# Patient Record
Sex: Male | Born: 1935 | Race: White | Hispanic: No | Marital: Married | Smoking: Former smoker
Health system: Southern US, Community
[De-identification: ages and names within clinical notes are randomized; demographics above are authoritative.]

## PROBLEM LIST (undated history)

## (undated) DIAGNOSIS — I499 Cardiac arrhythmia, unspecified: Secondary | ICD-10-CM

## (undated) DIAGNOSIS — R0689 Other abnormalities of breathing: Secondary | ICD-10-CM

## (undated) DIAGNOSIS — E785 Hyperlipidemia, unspecified: Secondary | ICD-10-CM

## (undated) DIAGNOSIS — R040 Epistaxis: Secondary | ICD-10-CM

## (undated) DIAGNOSIS — M1990 Unspecified osteoarthritis, unspecified site: Secondary | ICD-10-CM

## (undated) DIAGNOSIS — D649 Anemia, unspecified: Secondary | ICD-10-CM

## (undated) DIAGNOSIS — I4892 Unspecified atrial flutter: Secondary | ICD-10-CM

## (undated) DIAGNOSIS — I779 Disorder of arteries and arterioles, unspecified: Secondary | ICD-10-CM

## (undated) DIAGNOSIS — IMO0002 Reserved for concepts with insufficient information to code with codable children: Secondary | ICD-10-CM

## (undated) DIAGNOSIS — M2550 Pain in unspecified joint: Secondary | ICD-10-CM

## (undated) DIAGNOSIS — I6529 Occlusion and stenosis of unspecified carotid artery: Secondary | ICD-10-CM

## (undated) DIAGNOSIS — H9190 Unspecified hearing loss, unspecified ear: Secondary | ICD-10-CM

## (undated) DIAGNOSIS — I10 Essential (primary) hypertension: Secondary | ICD-10-CM

## (undated) DIAGNOSIS — I351 Nonrheumatic aortic (valve) insufficiency: Secondary | ICD-10-CM

## (undated) DIAGNOSIS — Z8719 Personal history of other diseases of the digestive system: Secondary | ICD-10-CM

## (undated) HISTORY — PX: EYE SURGERY: SHX253

## (undated) HISTORY — DX: Anemia, unspecified: D64.9

## (undated) HISTORY — DX: Reserved for concepts with insufficient information to code with codable children: IMO0002

## (undated) HISTORY — DX: Occlusion and stenosis of unspecified carotid artery: I65.29

## (undated) HISTORY — DX: Essential (primary) hypertension: I10

## (undated) HISTORY — PX: CATARACT EXTRACTION: SUR2

## (undated) HISTORY — DX: Epistaxis: R04.0

## (undated) HISTORY — DX: Hyperlipidemia, unspecified: E78.5

## (undated) HISTORY — DX: Unspecified osteoarthritis, unspecified site: M19.90

## (undated) HISTORY — PX: CAROTID ENDARTERECTOMY: SUR193

## (undated) HISTORY — DX: Unspecified hearing loss, unspecified ear: H91.90

## (undated) HISTORY — DX: Pain in unspecified joint: M25.50

---

## 1993-10-21 DIAGNOSIS — IMO0002 Reserved for concepts with insufficient information to code with codable children: Secondary | ICD-10-CM

## 1993-10-21 HISTORY — DX: Reserved for concepts with insufficient information to code with codable children: IMO0002

## 1998-11-10 ENCOUNTER — Ambulatory Visit (HOSPITAL_COMMUNITY): Admission: RE | Admit: 1998-11-10 | Discharge: 1998-11-10 | Payer: Self-pay | Admitting: Gastroenterology

## 2000-11-03 ENCOUNTER — Encounter: Admission: RE | Admit: 2000-11-03 | Discharge: 2000-11-03 | Payer: Self-pay | Admitting: Gastroenterology

## 2000-11-03 ENCOUNTER — Encounter: Payer: Self-pay | Admitting: Gastroenterology

## 2001-11-05 ENCOUNTER — Encounter: Admission: RE | Admit: 2001-11-05 | Discharge: 2001-11-05 | Payer: Self-pay | Admitting: Urology

## 2001-11-05 ENCOUNTER — Encounter: Payer: Self-pay | Admitting: Urology

## 2001-11-26 ENCOUNTER — Encounter: Payer: Self-pay | Admitting: Urology

## 2001-11-26 ENCOUNTER — Encounter: Admission: RE | Admit: 2001-11-26 | Discharge: 2001-11-26 | Payer: Self-pay | Admitting: Urology

## 2001-11-27 ENCOUNTER — Ambulatory Visit (HOSPITAL_BASED_OUTPATIENT_CLINIC_OR_DEPARTMENT_OTHER): Admission: RE | Admit: 2001-11-27 | Discharge: 2001-11-27 | Payer: Self-pay | Admitting: Urology

## 2001-11-27 ENCOUNTER — Encounter (INDEPENDENT_AMBULATORY_CARE_PROVIDER_SITE_OTHER): Payer: Self-pay | Admitting: Specialist

## 2001-12-10 ENCOUNTER — Encounter (INDEPENDENT_AMBULATORY_CARE_PROVIDER_SITE_OTHER): Payer: Self-pay

## 2001-12-10 ENCOUNTER — Ambulatory Visit (HOSPITAL_COMMUNITY): Admission: RE | Admit: 2001-12-10 | Discharge: 2001-12-10 | Payer: Self-pay | Admitting: Gastroenterology

## 2002-03-10 ENCOUNTER — Encounter: Payer: Self-pay | Admitting: Family Medicine

## 2002-03-10 ENCOUNTER — Encounter: Admission: RE | Admit: 2002-03-10 | Discharge: 2002-03-10 | Payer: Self-pay | Admitting: Family Medicine

## 2002-12-16 ENCOUNTER — Observation Stay (HOSPITAL_COMMUNITY): Admission: EM | Admit: 2002-12-16 | Discharge: 2002-12-18 | Payer: Self-pay | Admitting: Gastroenterology

## 2005-12-24 ENCOUNTER — Emergency Department (HOSPITAL_COMMUNITY): Admission: EM | Admit: 2005-12-24 | Discharge: 2005-12-25 | Payer: Self-pay | Admitting: Emergency Medicine

## 2007-09-04 ENCOUNTER — Ambulatory Visit: Payer: Self-pay | Admitting: Vascular Surgery

## 2007-09-14 ENCOUNTER — Ambulatory Visit: Payer: Self-pay | Admitting: Vascular Surgery

## 2007-09-14 ENCOUNTER — Inpatient Hospital Stay (HOSPITAL_COMMUNITY): Admission: RE | Admit: 2007-09-14 | Discharge: 2007-09-15 | Payer: Self-pay | Admitting: Vascular Surgery

## 2007-09-14 ENCOUNTER — Encounter: Payer: Self-pay | Admitting: Vascular Surgery

## 2007-10-02 ENCOUNTER — Ambulatory Visit: Payer: Self-pay | Admitting: Vascular Surgery

## 2008-04-01 ENCOUNTER — Ambulatory Visit: Payer: Self-pay | Admitting: Vascular Surgery

## 2009-04-14 ENCOUNTER — Ambulatory Visit: Payer: Self-pay | Admitting: Vascular Surgery

## 2009-05-12 ENCOUNTER — Encounter: Admission: RE | Admit: 2009-05-12 | Discharge: 2009-05-12 | Payer: Self-pay | Admitting: Emergency Medicine

## 2010-04-12 ENCOUNTER — Ambulatory Visit: Payer: Self-pay | Admitting: Vascular Surgery

## 2011-03-05 NOTE — Procedures (Signed)
CAROTID DUPLEX EXAM   INDICATION:  Follow-up evaluation of known carotid artery disease.   HISTORY:  Diabetes:  Yes.  Cardiac:  No.  Hypertension:  Yes.  Smoking:  Quit 32 years ago.  Previous Surgery:  Right carotid endarterectomy with Dacron patch  angioplasty on 09/14/07.  CV History:  No.  Amaurosis Fugax No, Paresthesias No, Hemiparesis No.                                       RIGHT             LEFT  Brachial systolic pressure:         130               130  Brachial Doppler waveforms:         WNL               WNL  Vertebral direction of flow:        Antegrade         Antegrade  DUPLEX VELOCITIES (cm/sec)  CCA peak systolic                   117               157  ECA peak systolic                   200               168  ICA peak systolic                   107               108  ICA end diastolic                   36                36  PLAQUE MORPHOLOGY:                  None              Heterogenous  PLAQUE AMOUNT:                      None              Moderate  PLAQUE LOCATION:                    None              CCA, ICA, ECA   IMPRESSION:  1. Patent right internal carotid artery with no evidence of      restenosis.  2. 20-39% left internal carotid artery stenosis.       ___________________________________________  Larina Earthly, M.D.   AC/MEDQ  D:  04/14/2009  T:  04/14/2009  Job:  [id number]

## 2011-03-05 NOTE — Assessment & Plan Note (Signed)
OFFICE VISIT   [name], [name]  DOB:  1930-08-28                                       04/01/2008  CHART#:[id number]   The patient presents today for followup of his right carotid  endarterectomy and Dacron patch angioplasty on 09/14/2007.  He continues  to do well and has had no neurologic deficits.  He has had no other  major medical difficulties, specifically no cardiac or other pulmonary  difficulties.  He does report some peri-incisional numbness and this is  continuing to improve.   PHYSICAL EXAM:  General:  A well-developed, well-nourished white male  appearing his stated age of 36.  Vital signs:  Blood pressure is 147/76,  pulse is 74, respirations 18.  Neurological:  He is grossly intact  neurologically.  Neck:  His right neck incision is well-healed.  He has  no bruits bilaterally.  He has a normal right carotid pulse.   He underwent carotid duplex in our office today and this reveals widely  patent endarterectomy with no narrowing.  He does have mild plaque in  his left internal carotid artery with approximately 20-39% stenosis.  This is unchanged from his study 6 months ago.  I am quite pleased with  his initial progress.  We plan to see him again in our vascular lab per  protocol followup.   Larina Earthly, M.D.  Electronically Signed   TFE/MEDQ  D:  04/01/2008  T:  04/01/2008  Job:  [id number]   cc:   Tammy R. Collins [place], M.D.

## 2011-03-05 NOTE — Procedures (Signed)
CAROTID DUPLEX EXAM   INDICATION:  Followup of known carotid artery disease.  The patient is  asymptomatic.   HISTORY:  Diabetes:  Yes.  Cardiac:  No.  Hypertension:  Yes.  Smoking:  Quit.  Previous Surgery:  Right CEA with DPA on 09/14/2007.  CV History:  Amaurosis Fugax No, Paresthesias No, Hemiparesis No                                       RIGHT             LEFT  Brachial systolic pressure:         124               124  Brachial Doppler waveforms:         WNL               WNL  Vertebral direction of flow:        Antegrade         Antegrade  DUPLEX VELOCITIES (cm/sec)  CCA peak systolic                   120               103  ECA peak systolic                   184               140  ICA peak systolic                   99                104  ICA end diastolic                   20                34  PLAQUE MORPHOLOGY:                  N/A               Mixed, calcific  with shadowing  PLAQUE AMOUNT:                      N/A               Moderate  PLAQUE LOCATION:                    N/A               Bifurcation, ICA   IMPRESSION:  1. Mild diffuse plaquing noted in left CCA.  2. Right ICA without recurrent stenosis status post CEA.  3. Left 20-39% ICA stenosis.  4. Patent ECAs.  5. Bilateral antegrade flow in vertebral arteries.   ___________________________________________  Larina Earthly, M.D.   PB/MEDQ  D:  04/01/2008  T:  04/01/2008  Job:  [id number]

## 2011-03-05 NOTE — H&P (Signed)
HISTORY AND PHYSICAL EXAMINATION   September 06, 2007   Re:  [name], [name]               DOB:  1929/12/15   CHIEF COMPLAINT:  Extracranial cerebrovascular disease.   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  He subsequently underwent carotid  duplex evaluation revealing moderate-to-severe right internal carotid  artery stenosis, and I am seeing him for further discussion of this.  He  specifically denies any visual changes, any amaurosis fugax, transient  ischemic attack, or stroke.  He does have a history of non-insulin-  dependent diabetes, hypertension, and elevated cholesterol.  He does not  have any history of cardiac disease.   FAMILY HISTORY:  Negative for premature atherosclerotic disease.   SOCIAL HISTORY:  He is married with three children.  Is a retired  Runner, broadcasting/film/video.  He does not smoke, having quit in 1978 and does have  occasional social alcohol consumption.   REVIEW OF SYSTEMS:  GENERAL:  He has had no weight loss or gain.  His  weight is 188 pounds.  Height is 6 feet 1 inches.  CARDIAC:  No shortness of breath, chest pain, or palpitations.  PULMONARY:  No shortness of breath or cough.  GI:  He does have a hiatal hernia with reflux.  GU:  No urinary frequency.  NEURO:  Again, no dizziness, blackouts, headaches, or seizures.  ORTHOPEDIC:  Positive for arthritic joint pain.  He does have diminished hearing.   ALLERGIES:  None.   MEDICATIONS:  1. Cozaar 100 mg 1 daily.  2. Alendronate 70 mg once daily.  3. Gemfibrozil 600 mg 1 twice daily.  4. Simvastatin 80 mg 1/2 tablet at bedtime.  5. Glipizide 5 mg per day.  6. Fish oil 1000 mg 4 times per day.  7. Trazodone 100 mg at bedtime.   PHYSICAL EXAMINATION:  A well-developed and well-nourished white male  appearing staged age of 77.  Blood pressure is 147/70, pulse  84,  respirations 18.  O2 saturation is 97% on room air.  His carotid  arteries reveal no bruits bilaterally.  He has 2+ radial pulses, 2+  femoral pulses.  Chest is clear to auscultation bilaterally.  Heart has  a regular rate and rhythm without murmur.   He did undergo repeat carotid duplex in our office today to determine if  he was a candidate for surgery on the basis of duplex alone.  This did  reveal severe, greater than 80% stenosis in his right carotid system and  no severe stenosis in his left carotid system.  Discussed this with  length with the patient's wife.  I have recommended endarterectomy for  reduction of stroke risk.  I explained the procedure, including a 1-3%  risk of stroke with surgery.  Also a slight risk for cranial nerve  injury, bleeding, and infection.  He understands this and wishes to  proceed with endarterectomy for severe right internal carotid artery  stenosis on November 24 at [facility].   Larina Earthly, M.D.  Electronically Signed   TFE/MEDQ  D:  09/06/2007  T:  09/07/2007  Job:  [id number]   cc:   Tammy R. Collins [place], M.D.

## 2011-03-05 NOTE — Discharge Summary (Signed)
NAMEKYRAN, [name]                   ACCOUNT NO.:  [id number]   MEDICAL RECORD NO.:  [id number]          PATIENT TYPE:  INP   LOCATION:  3316                         FACILITY:  [facility]   PHYSICIAN:  Larina Earthly, M.D.    DATE OF BIRTH:  [birth year]   DATE OF ADMISSION:  09/14/2007  DATE OF DISCHARGE:  09/15/2007                               DISCHARGE SUMMARY   ADMISSION DIAGNOSIS:  Severe right internal carotid artery stenosis,  asymptomatic.   SECONDARY DIAGNOSES:  1. Severe right internal carotid artery stenosis, asymptomatic, status      post right carotid endarterectomy.  2. Postoperative hypotension, improved.  3. Mild postoperative acute blood loss anemia.  4. Diabetes mellitus type 2.  5. Hyperlipidemia.  6. Hypertension.  7. Remote history of peptic ulcer disease.  8. Remote history of tobacco use, quit in 1978.  9. Benign prostatic hypertrophy.   ALLERGIES:  NO KNOWN DRUG ALLERGIES.   PROCEDURE:  On September 14, 2007, a right carotid endarterectomy with  Dacron patch angioplasty by Dr. Gretta Began.   BRIEF HISTORY:  Mr. [name] is a 77 year old Caucasian male who was seen  by Dr. Arbie Cookey for extracranial cerebrovascular occlusive disease.  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  He subsequently underwent carotid  duplex revealing moderate to severe right internal carotid artery  stenosis and was referred to Dr. Arbie Cookey.  He denies any visual changes,  amaurosis fugax, history of TIA or stroke.  He underwent repeat carotid  duplex at [facility] to determine if he  was a candidate for surgery on the basis of duplex alone.  This did  reveal greater than 80% stenosis in his right carotid system and no  severe stenosis in his left.  Dr. Arbie Cookey recommended elective right  carotid endarterectomy to reduce his risk for future stroke.   HOSPITAL COURSE:  Mr. [name] was electively admitted to  [facility] on September 14, 2007.  He underwent the previously mentioned  procedure.  He was extubated and neurologically intact.  Went down to  short-stay recovery unit, was transferred to step-down unit 3300 where  it is anticipated he will remain until discharge.  He did require short-  term dopamine for hypotension, but had subsequent palpitations and was  discontinued.  His blood pressure was then treated with Hespan and it  appeared that he had no further problems.  He maintained sinus rhythm.  He did require some oxygen initially, but this has been weaned.  He was  started on NovoLog insulin sliding scale for his history of diabetes and  diabetic medications were resumed on postoperative day #1.  Current  vitals show temperature of 97.9, blood pressure 122/48 and heart rate in  the 60s in the sinus rhythm.  Labs show a white count of 7, hemoglobin  9.9, hematocrit 28.1, platelet count of 184,000.  Sodium 136, potassium  4.1, BUN 17, creatinine 1.34 and blood glucose 135.  By postoperative  day #1,  his Foley catheter and arterial line had been discontinued.  He  was ambulating and tolerating diabetic-appropriate diet.  Incision  showed no evidence of hematoma or infection.  Neurologically, he  remained intact.  Tongue was midline and he denies dysphagia.  It was  felt that he met criteria for discharge and we anticipate him going home  on September 15, 2007, postoperative day #1.  Currently he remains in  stable condition.   DISCHARGE MEDICATIONS:  1. Cozaar 100 mg daily.  2. Omeprazole 20 mg daily.  3. Simvastatin 80 mg 1/2 tablet q.h.s.  4. Glipizide 5 mg q.a.m.  5. Trazodone 50 mg 1 to 2 tablets p.o. q.h.s. p.r.n.  6. Fish oil capsule 1000 mg 2 tablets b.i.d.  7. Gemfibrozil 600 mg p.o. b.i.d.  8. Alendronate 70 mg weekly on Sundays.  9. Coated aspirin 325 mg daily.  10.Percocet 5/325 mg 1 to 2 tablets p.o. q.4 h. p.r.n. pain.   DISCHARGE INSTRUCTIONS:  He is to  continue diabetic-appropriate diet.  May shower and clean incision gently with soap and water.  Should avoid  driving or heavy lifting for the next two weeks.  Call if he develops  fever greater than 101, redness or drainage from his incision site or  any neurological changes.  He will see Dr. Arbie Cookey at the [facility] office in  approximately two weeks.  Our office will contact him regarding specific  appointment date and time.      Jerold Coombe, P.A.      Larina Earthly, M.D.  Electronically Signed    AWZ/MEDQ  D:  09/15/2007  T:  09/15/2007  Job:  [id number]   cc:   Tammy R. Collins Scotland, M.D.  Micheline Chapman

## 2011-03-05 NOTE — Assessment & Plan Note (Signed)
OFFICE VISIT   Goette, Azam  DOB:  May 31, 1930                                       10/02/2007  CHART#:[id number]   The patient presents today for followup of his right carotid  endarterectomy and Dacron patch angioplasty on September 14, 2007.  He  had done extremely well in the hospital and was discharged postoperative  day 1.  He did have some posterior cervical discomfort, probably from  positioning following the procedure, but this has improved.  He has had  no neurologic deficits and his wound is healing quite nicely.  He has  the usual amount of peri-incisional numbness.  He has no bruits  bilaterally.  His neck incision is healing without any evidence of  infection.  He is grossly intact neurologically.  He will resume full  activities without limitations.  Plan to see him again in 6 months with  repeat carotid duplex at that time.  He has tried daily aspirin and  continues to have severe nosebleeds with this, and I have instructed him  to discontinue this aspirin treatment.   Larina Earthly, M.D.  Electronically Signed   TFE/MEDQ  D:  10/02/2007  T:  10/05/2007  Job:  [id number]   cc:   Tammy R. Collins Scotland, M.D.

## 2011-03-05 NOTE — Procedures (Signed)
CAROTID DUPLEX EXAM   INDICATION:  Followup known carotid disease.   HISTORY:  Diabetes:  Yes.  Cardiac:  No.  Hypertension:  Yes.  Smoking:  Previous.  Previous Surgery:  Right carotid endarterectomy with Dacron patch  angioplasty on 09/14/2007.  CV History:  Asymptomatic.  Amaurosis Fugax No, Paresthesias No, Hemiparesis No                                       RIGHT             LEFT  Brachial systolic pressure:         110               110  Brachial Doppler waveforms:         Normal            Normal  Vertebral direction of flow:        Antegrade         Antegrade  DUPLEX VELOCITIES (cm/sec)  CCA peak systolic                   88                110  ECA peak systolic                   91                123  ICA peak systolic                   75                96  ICA end diastolic                   24                38  PLAQUE MORPHOLOGY:                  None              Heterogeneous  PLAQUE AMOUNT:                      None              Moderate  PLAQUE LOCATION:                    None              CCA, ICA, ECA   IMPRESSION:  1. Patent right internal carotid artery post endarterectomy with no      evidence of restenosis.  2. Doppler velocity suggests 20%-39% stenosis in the left internal      carotid artery.  3. Antegrade flow in bilateral vertebral arteries.   ___________________________________________  Larina Earthly, M.D.   NT/MEDQ  D:  04/12/2010  T:  04/12/2010  Job:  [id number]

## 2011-03-05 NOTE — Op Note (Addendum)
NAMEBREAKER, SPRINGER                   ACCOUNT NO.:  [id number]   MEDICAL RECORD NO.:  [id number]          PATIENT TYPE:  INP   LOCATION:  3316                         FACILITY:  [facility]   PHYSICIAN:  Larina Earthly, M.D.    DATE OF BIRTH:  11-16-1929   DATE OF PROCEDURE:  09/14/2007  DATE OF DISCHARGE:                               OPERATIVE REPORT   PREOPERATIVE DIAGNOSIS:  Severe right internal carotid artery stenosis  with probable embolus to right eye.   POSTOPERATIVE DIAGNOSIS:  Severe right internal carotid artery stenosis  with probable embolus to right eye.   PROCEDURE:  Right carotid endarterectomy and Dacron patch angioplasty.   SURGEON:  Larina Earthly, M.D.   ASSISTANT:  Jerold Coombe, P.A.-C.   ANESTHESIA:  General endotracheal.   COMPLICATIONS:  None.   DISPOSITION:  To recovery room neurologically intact.   PROCEDURE IN DETAIL:  The patient was taken to the operating room,  placed supine position.  The right neck was prepped and draped in usual  sterile fashion.  An incision was made over the anterior  sternocleidomastoid and carried down to the platysma with  electrocautery.  The sternocleidomastoid reflected posteriorly and the  carotid sheath was opened.  The fascial vein was ligated with 2-0 silk  ties and divided.  The common carotid artery was encircled with an  umbilical tape ____ QA MARKER: 91 ____ tourn  hypoglossal nerves were identified and preserved.  Dissection was ____                                                              QA MARKER: 95____ bifurcation ____ QA MARKER____   was encircled with a 2-0 silk Potts tie.  The external carotid was  encircled with a blue vessel loop.  The internal carotid was encircled  with umbilical tape ____ QA MARKER: 102 ____ tourn  was given 8000 units of intravenous heparin.  After adequate circulation  time the internal, external and common carotid arteries were occluded.  The common carotid artery was  opened with an 11 blade ____ QA MARKER:                                                   112____ Potts scissors ____ QA MARKER____   carotid artery.  The patient had an extremely ulcerative plaque.  The  endarterectomy was begun on the common carotid artery and the plaques  divided proximally with the Potts scissors.  The endarterectomy was  carried on to the bifurcation.  The external carotids ____ QA MARKER:  126____ technique and the internal carotids ____ QA MARKER____   in open fashion.  Remaining thrombus debris was removed from the  endarterectomy plane.  The ____ QA MARKER: 134 ____ Gillie Manners  brought onto the field and ____ QA MARKER: 136 ____ patch  with a running 6-0 Prolene suture.  Prior to completion of the  anastomosis the shunt was removed in the usual flush maneuvers were  undertaken.  The anastomosis was completed.  Flow was restored first to  the external and then internal carotid artery.  Excellent flow  characteristics were noted with handheld Doppler in the internal and  external carotid arteries.  The patient was given 50 mg of ____ QA                                                           MARKER: 155____ to reverse heparin.  The wounds were irrigated with      saline and made hemostatic with cautery.  The wounds were closed with 3-  0 Vicryl to reapproximate the sternocleidomastoid at the carotid sheath.  Next the platysma was closed with a running 3-0 Vicryl suture and  finally  the skin was closed with 4-0 subcuticular Vicryl stitch.  A sterile  dressing, benzoin and Steri-Strips were applied and the patient was  awakened in the operating room neurologically intact, transferred to the  recovery room in stable condition.      Larina Earthly, M.D.  Electronically Signed     TFE/MEDQ  D:  09/14/2007  T:  09/14/2007  Job:  [id number]   cc:   Tammy R. Collins Scotland, M.D.

## 2011-03-05 NOTE — Procedures (Signed)
CAROTID DUPLEX EXAM   INDICATION:  Followup bilateral carotid artery disease.   HISTORY:  Diabetes:  Yes.  Cardiac:  No.  Hypertension:  Yes.  Smoking:  Quit 30 years ago.  Previous Surgery:  CV History:  Amaurosis Fugax No, Paresthesias No, Hemiparesis No                                       RIGHT             LEFT  Brachial systolic pressure:         130               176  Brachial Doppler waveforms:         Biphasic.         Biphasic.  Vertebral direction of flow:        Antegrade.        Antegrade.  DUPLEX VELOCITIES (cm/sec)  CCA peak systolic                   76                110  ECA peak systolic                   118               170  ICA peak systolic                   337               104  ICA end diastolic                   116               23  PLAQUE MORPHOLOGY:                  Heterogeneous.    Heterogeneous.  PLAQUE AMOUNT:                      Severe.           Mild.  PLAQUE LOCATION:                    ICA and ECA.      ICA and ECA.   IMPRESSION:  1. 80-99% stenosis noted in the right ICA.  2. 20-30% stenosis noted in the left ICA.  3. Antegrade bilateral vertebral arteries.   ___________________________________________  Larina Earthly, M.D.   MG/MEDQ  D:  09/04/2007  T:  09/05/2007  Job:  [id number]

## 2011-03-08 NOTE — Op Note (Signed)
[facility]  Patient:    [name], PINN Visit Number: [id number] MRN: [id number]          Service Type: NES Location: NESC Attending Physician:  Tania Ade Dictated by:   Lucrezia Starch. Ovidio Hanger, M.D. Proc. Date: 11/27/01 Admit Date:  11/27/2001                             Operative Report  DIAGNOSIS:  Bladder pain.  OPERATIVE PROCEDURE: 1. Cystourethroscopy. 2. Bladder biopsy.  SURGEON:  Lucrezia Starch. Ovidio Hanger, M.D.  ANESTHESIA:  General laryngeal airway.  BLOOD LOSS:  Negligible.  TUBES:  None.  COMPLICATIONS:  None.  INDICATION FOR PROCEDURE:  Mr. Lattanzio is a very nice 77 year old white male, who presented with a two month history of abdominal pain and discomfort, pain in the penis, nocturia x 2, variable flow, hesitancy, urgency, and frequency. He has undergo a CT scan of the abdomen and pelvis which revealed no significant abnormalities and office cystoscopy which was essentially normal. With the continued pain, it was felt that cystourethroscopy and bladder biopsy was indicated.  He understands the risks, benefits, and alternatives and elected to proceed.  PROCEDURE IN DETAIL:  The patient was placed in the supine position.  After proper general laryngeal airway anesthesia, was placed in the dorsal lithotomy position and prepped and draped with Betadine in a sterile fashion. Cystourethroscopy was performed with a 22.5 French Olympus panendoscope, utilizing the 12 and 70 degree lenses.  The bladder was carefully inspected. He was noted to have moderate trilobar hypertrophy; grade 1 trabeculation was noted.  Efflux of clear urine was noted with normally-placed ureteral orifices bilaterally.  No other lesions were noted.  A biopsy was obtained from the posterior midline and submitted to pathology, and base was cauterized with Bugbee coagulation cautery.  The bladder was drained; the panendoscope was removed.  There had been no palpable  bladder masses on rectal examination, and the prostate was benign.  The patient was taken to the recovery room stable. Dictated by:   Lucrezia Starch. Ovidio Hanger, M.D. Attending Physician:  Tania Ade DD:  11/27/01 TD:  11/27/01 Job: [id number]

## 2011-03-08 NOTE — Procedures (Signed)
[place]. [facility]  Patient:    [name], [name] Visit Number: [id number] MRN: [id number]          Service Type: NES Location: [facility] Attending Physician:  Tania Ade Dictated by:   Llana Aliment. Randa Evens, M.D. Proc. Date: 12/10/01 Admit Date:  11/27/2001 Discharge Date: 11/27/2001   CC:         Tammy R. Collins [place], M.D.   Procedure Report  DATE OF BIRTH:  1930-04-09.  PROCEDURE PERFORMED:  Colonoscopy and polypectomy.  ENDOSCOPIST:  Llana Aliment. Randa Evens, M.D.  MEDICATIONS USED:  Fentanyl 100 mcg, Versed 12.5 mg IV.  INSTRUMENT:  Adult Olympus video colonoscope.  INDICATIONS:  77 year old who had an ascending colon polyp removed three years go.  This is done as a three-year follow.  DESCRIPTION OF PROCEDURE:  The procedure had been explained to the patient and consent obtained.  With the patient in the left lateral decubitus position, the adult colonoscope was inserted and advanced under direct visualization. The prep was quite good.  The patient had marked diverticular disease.  Some time was taken to pass the sigmoid colon and we were able to advance through this area to the cecum.  The ileocecal valve and appendiceal orifice were seen.  The scope was withdrawn.  In the proximal ascending colon, a 0.5 cm pedunculated polyp was removed with a snare and sucked through the scope. Just distal to that also in the ascending colon, another 0.5 cm polyp was removed and sucked through the scope. These were both placed in jar #1.  The transverse colon was unremarkable.  No polyps were seen.  In the proximal descending colon 100 cm from the anal verge, a very large 1.5 cm polyp on a very small stalk was encountered.  This was removed with a snare and recovered, was temporarily lost and on the way back in the distal descending colon approximately 50 cm a 1 cm polyp was encountered and was removed with a snare and sucked through the scope.  We eventually found a  larger polyp and removed this.  There was marked diverticular disease again in the sigmoid colon.  No gross polyps were seen in that area.  The rectum was free of polyps.  Scope withdrawn, patient tolerated the procedure well.  He had some transient discomfort improved after air was decompressed from the colon.  ASSESSMENT: 1. Multiple polyps removed. 2. Marked diverticular disease.  PLAN:  Routine post polypectomy instructions.  Due to the number of polyps, will recommend repeating this procedure in one year.  Will probably suggest to him that he have this done at an outpatient setting where he can receive Diprivan. Dictated by:   Llana Aliment. Randa Evens, M.D. Attending Physician:  Tania Ade DD:  12/10/01 TD:  12/10/01 Job: [id number]

## 2011-04-16 ENCOUNTER — Other Ambulatory Visit: Payer: Self-pay

## 2011-04-16 ENCOUNTER — Ambulatory Visit: Payer: Self-pay | Admitting: Vascular Surgery

## 2011-04-23 ENCOUNTER — Ambulatory Visit: Payer: Self-pay | Admitting: Vascular Surgery

## 2011-04-23 ENCOUNTER — Other Ambulatory Visit: Payer: Self-pay

## 2011-05-07 ENCOUNTER — Ambulatory Visit: Payer: Self-pay | Admitting: Vascular Surgery

## 2011-05-07 ENCOUNTER — Other Ambulatory Visit: Payer: Self-pay

## 2011-06-04 ENCOUNTER — Encounter: Payer: Self-pay | Admitting: Vascular Surgery

## 2011-06-21 ENCOUNTER — Encounter: Payer: Self-pay | Admitting: Vascular Surgery

## 2011-06-25 ENCOUNTER — Ambulatory Visit (INDEPENDENT_AMBULATORY_CARE_PROVIDER_SITE_OTHER): Payer: Medicare Other | Admitting: Vascular Surgery

## 2011-06-25 ENCOUNTER — Encounter: Payer: Self-pay | Admitting: Vascular Surgery

## 2011-06-25 ENCOUNTER — Other Ambulatory Visit (INDEPENDENT_AMBULATORY_CARE_PROVIDER_SITE_OTHER): Payer: Medicare Other

## 2011-06-25 VITALS — BP 151/77 | HR 53 | Resp 16 | Ht 73.0 in | Wt 195.0 lb

## 2011-06-25 DIAGNOSIS — I6529 Occlusion and stenosis of unspecified carotid artery: Secondary | ICD-10-CM

## 2011-06-25 DIAGNOSIS — Z48812 Encounter for surgical aftercare following surgery on the circulatory system: Secondary | ICD-10-CM

## 2011-06-25 NOTE — Progress Notes (Signed)
The patient presents today for followup of his extracranial cerebrovascular occlusive disease. He is status post right carotid endarterectomy in November of 2008. He remains quite active and denies any new medical difficulties. He denies any focal neurologic deficits specifically no amaurosis fugax transient ischemic attack or stroke.  Past Medical History  Diagnosis Date  . Diabetes mellitus age 77  . Hyperlipidemia   . Hypertension   . Arthritis   . Bleeding nose     occasional  . Change in hearing   . Joint pain   . Anemia   . Ulcer   . Hiatal hernia     History  Substance Use Topics  . Smoking status: Former Smoker    Types: Cigarettes    Quit date: 10/21/1976  . Smokeless tobacco: Not on file  . Alcohol Use: Yes     1-2 alcoholic drinks daily    History reviewed. No pertinent family history.  No Known Allergies  Current outpatient prescriptions:fish oil-omega-3 fatty acids 1000 MG capsule, Take 2 g by mouth 2 (two) times daily. , Disp: , Rfl: ;  gemfibrozil (LOPID) 600 MG tablet, Take 600 mg by mouth 2 (two) times daily before a meal.  , Disp: , Rfl: ;  glipiZIDE (GLUCOTROL) 5 MG tablet, Take 5 mg by mouth daily.  , Disp: , Rfl: ;  insulin detemir (LEVEMIR) 100 UNIT/ML injection, Inject 25 Units into the skin daily before breakfast.  , Disp: , Rfl:  losartan (COZAAR) 100 MG tablet, Take 100 mg by mouth daily.  , Disp: , Rfl: ;  simvastatin (ZOCOR) 80 MG tablet, Take 40 mg by mouth at bedtime.  , Disp: , Rfl: ;  traZODone (DESYREL) 100 MG tablet, Take 100 mg by mouth at bedtime.  , Disp: , Rfl: ;  alendronate (FOSAMAX) 70 MG tablet, Take 70 mg by mouth every 7 (seven) days. Take with a full glass of water on an empty stomach. , Disp: , Rfl:   BP 151/77  Pulse 53  Resp 16  Ht 6\' 1"  (1.854 m)  Wt 195 lb (88.451 kg)  BMI 25.73 kg/m2  Body mass index is 25.73 kg/(m^2).         Review of systems: Hiatal hernia urinary frequency, change in hearing and nosebleeds,  arthritis joint pain muscle pain. All of systems negative.  Physical exam: Well-developed well-nourished white male in no acute distress. HEENT normal. Chest clear bilaterally without wheezing. Heart regular rate and rhythm without murmur. Well-healed right carotid incision with no bruits bilaterally. Her logic no focal weakness paresthesias. Skin without ulcers or rashes.  Carotid duplex: Widely patent endarterectomy on the right and 1-39% stenosis in the left internal carotid artery.  Impression: Stable status post right carotid endarterectomy  Plan: A carotid duplex in one year. The patient will notify us for any neurologic deficits.

## 2011-07-04 NOTE — Procedures (Unsigned)
CAROTID DUPLEX EXAM  INDICATION:  Followup carotid disease; right CEA.  HISTORY: Diabetes:  Yes. Cardiac:  No. Hypertension:  Yes. Smoking:  Previous. Previous Surgery:  Right CEA 09/14/2007. CV History: Amaurosis Fugax No, Paresthesias No, Hemiparesis No                                      RIGHT             LEFT Brachial systolic pressure:         148               160 Brachial Doppler waveforms:         WNL               WNL Vertebral direction of flow:        Antegrade         Antegrade DUPLEX VELOCITIES (cm/sec) CCA peak systolic                   87                111 ECA peak systolic                   115               153 ICA peak systolic                   48                78 ICA end diastolic                   13                22 PLAQUE MORPHOLOGY:                                    Heterogeneous PLAQUE AMOUNT:                      NA                Mild PLAQUE LOCATION:                                      CCA/ICA  IMPRESSION: 1. Widely patent right carotid endarterectomy without evidence of     hyperplasia or restenosis. 2. 1%-39% left internal carotid artery plaquing; mild diffuse plaquing     of left common carotid artery. 3. Bilateral vertebral arteries are within normal limits. 4. Significant stenosis of right subclavian artery with velocities of     282 cm/s.  ___________________________________________ Larina Earthly, M.D.  LT/MEDQ  D:  06/25/2011  T:  06/25/2011  Job:  [id number]

## 2011-07-30 LAB — CBC
Hemoglobin: 9.9 — ABNORMAL LOW
MCHC: 35
MCHC: 35.1
MCV: 95.3
Platelets: 184
Platelets: 270
RDW: 12.4

## 2011-07-30 LAB — BASIC METABOLIC PANEL
BUN: 17
CO2: 22
Calcium: 8.4
Creatinine, Ser: 1.34
GFR calc non Af Amer: 52 — ABNORMAL LOW
Glucose, Bld: 135 — ABNORMAL HIGH
Sodium: 136

## 2011-07-30 LAB — TYPE AND SCREEN: ABO/RH(D): A POS

## 2011-07-30 LAB — URINALYSIS, ROUTINE W REFLEX MICROSCOPIC
Ketones, ur: NEGATIVE
Nitrite: NEGATIVE
Specific Gravity, Urine: 1.021
Urobilinogen, UA: 1
pH: 6.5

## 2011-07-30 LAB — COMPREHENSIVE METABOLIC PANEL
AST: 20
Albumin: 3.8
BUN: 19
Calcium: 9.6
Creatinine, Ser: 1.02
GFR calc Af Amer: >60
GFR calc non Af Amer: >60

## 2011-07-30 LAB — APTT: aPTT: 24

## 2011-07-30 LAB — PROTIME-INR: INR: 0.9

## 2011-12-13 ENCOUNTER — Other Ambulatory Visit: Payer: Self-pay | Admitting: Dermatology

## 2012-06-23 ENCOUNTER — Other Ambulatory Visit: Payer: Self-pay | Admitting: *Deleted

## 2012-06-23 DIAGNOSIS — I6529 Occlusion and stenosis of unspecified carotid artery: Secondary | ICD-10-CM

## 2012-06-23 DIAGNOSIS — Z48812 Encounter for surgical aftercare following surgery on the circulatory system: Secondary | ICD-10-CM

## 2012-06-29 ENCOUNTER — Encounter: Payer: Self-pay | Admitting: Neurosurgery

## 2012-06-30 ENCOUNTER — Other Ambulatory Visit (INDEPENDENT_AMBULATORY_CARE_PROVIDER_SITE_OTHER): Payer: Medicare Other | Admitting: *Deleted

## 2012-06-30 ENCOUNTER — Encounter: Payer: Self-pay | Admitting: Neurosurgery

## 2012-06-30 ENCOUNTER — Ambulatory Visit (INDEPENDENT_AMBULATORY_CARE_PROVIDER_SITE_OTHER): Payer: Medicare Other | Admitting: Neurosurgery

## 2012-06-30 VITALS — BP 130/60 | HR 50 | Resp 14 | Ht 73.0 in | Wt 203.6 lb

## 2012-06-30 DIAGNOSIS — I6529 Occlusion and stenosis of unspecified carotid artery: Secondary | ICD-10-CM

## 2012-06-30 DIAGNOSIS — Z48812 Encounter for surgical aftercare following surgery on the circulatory system: Secondary | ICD-10-CM

## 2012-06-30 HISTORY — PX: OTHER SURGICAL HISTORY: SHX169

## 2012-06-30 NOTE — Addendum Note (Signed)
Addended by: Sharee Pimple on: 06/30/2012 04:20 PM   Modules accepted: Orders

## 2012-06-30 NOTE — Progress Notes (Signed)
[facility] Carotid Office Note  CC: Annual carotid surveillance Referring Physician: Early  History of Present Illness: 78 year old patient of Dr. Arbie Cookey status post right CEA in November 2008. The patient denies any signs or symptoms of CVA, TIA, amaurosis fugax or any neural deficit. The patient denies any right upper extremity steal syndrome pain and has no other vascular complaints.  Past Medical History  Diagnosis Date  . Diabetes mellitus age 39  . Hyperlipidemia   . Hypertension   . Arthritis   . Bleeding nose     occasional  . Change in hearing   . Joint pain   . Anemia   . Ulcer   . Hiatal hernia   . Carotid artery occlusion     ROS: [x]  Positive   [ ]  Denies    General: [ ]  Weight loss, [ ]  Fever, [ ]  chills Neurologic: [ ]  Dizziness, [ ]  Blackouts, [ ]  Seizure [ ]  Stroke, [ ]  "Mini stroke", [ ]  Slurred speech, [ ]  Temporary blindness; [ ]  weakness in arms or legs, [ ]  Hoarseness Cardiac: [ ]  Chest pain/pressure, [ ]  Shortness of breath at rest [ ]  Shortness of breath with exertion, [ ]  Atrial fibrillation or irregular heartbeat Vascular: [ ]  Pain in legs with walking, [ ]  Pain in legs at rest, [ ]  Pain in legs at night,  [ ]  Non-healing ulcer, [ ]  Blood clot in vein/DVT,   Pulmonary: [ ]  Home oxygen, [ ]  Productive cough, [ ]  Coughing up blood, [ ]  Asthma,  [ ]  Wheezing Musculoskeletal:  [ ]  Arthritis, [ ]  Low back pain, [ ]  Joint pain Hematologic: [ ]  Easy Bruising, [ ]  Anemia; [ ]  Hepatitis Gastrointestinal: [ ]  Blood in stool, [ ]  Gastroesophageal Reflux/heartburn, [ ]  Trouble swallowing Urinary: [ ]  chronic Kidney disease, [ ]  on HD - [ ]  MWF or [ ]  TTHS, [ ]  Burning with urination, [ ]  Difficulty urinating Skin: [ ]  Rashes, [ ]  Wounds Psychological: [ ]  Anxiety, [ ]  Depression   Social History History  Substance Use Topics  . Smoking status: Former Smoker    Types: Cigarettes    Quit date: 10/21/1976  . Smokeless tobacco:  Not on file  . Alcohol Use: Yes     1-2 alcoholic drinks daily    Family History Family History  Problem Relation Age of Onset  . Cancer Mother     Pancreatic    No Known Allergies  Current Outpatient Prescriptions  Medication Sig Dispense Refill  . fish oil-omega-3 fatty acids 1000 MG capsule Take 2 g by mouth 2 (two) times daily.       Marland Kitchen gemfibrozil (LOPID) 600 MG tablet Take 600 mg by mouth 2 (two) times daily before a meal.        . glipiZIDE (GLUCOTROL) 5 MG tablet Take 5 mg by mouth daily.        . insulin detemir (LEVEMIR) 100 UNIT/ML injection Inject 40 Units into the skin daily before breakfast.       . losartan (COZAAR) 100 MG tablet Take 100 mg by mouth daily.        . simvastatin (ZOCOR) 80 MG tablet Take 40 mg by mouth at bedtime.        . traZODone (DESYREL) 100 MG tablet Take 100 mg by mouth at bedtime.        Marland Kitchen alendronate (FOSAMAX) 70 MG tablet Take 70 mg by mouth every 7 (seven) days.  Take with a full glass of water on an empty stomach.         Physical Examination  Filed Vitals:   06/30/12 1458  BP: 130/60  Pulse: 50  Resp:     Body mass index is 26.86 kg/(m^2).  General:  WDWN in NAD Gait: Normal HEENT: WNL Eyes: Pupils equal Pulmonary: normal non-labored breathing , without Rales, rhonchi,  wheezing Cardiac: RRR, without  Murmurs, rubs or gallops; Abdomen: soft, NT, no masses Skin: no rashes, ulcers noted  Vascular Exam Pulses: 2+ radial pulses bilaterally Carotid bruits: Carotid pulses to auscultation no bruits are heard Extremities without ischemic changes, no Gangrene , no cellulitis; no open wounds;  Musculoskeletal: no muscle wasting or atrophy   Neurologic: A&O X 3; Appropriate Affect ; SENSATION: normal; MOTOR FUNCTION:  moving all extremities equally. Speech is fluent/normal  Non-Invasive Vascular Imaging CAROTID DUPLEX 06/30/2012  Right ICA 0 - 19% stenosis Left ICA 20 - 39 % stenosis   ASSESSMENT/PLAN: Asymptomatic patient 5  years status post right carotid endarterectomy doing well. The patient will followup in one year with repeat carotid duplex, his questions were encouraged and answered he is in agreement with this plan.  Lauree Chandler ANP   Clinic MD: Early

## 2012-07-21 DIAGNOSIS — I499 Cardiac arrhythmia, unspecified: Secondary | ICD-10-CM

## 2012-07-21 HISTORY — DX: Cardiac arrhythmia, unspecified: I49.9

## 2012-09-22 ENCOUNTER — Other Ambulatory Visit (HOSPITAL_COMMUNITY): Payer: Self-pay | Admitting: Cardiovascular Disease

## 2012-09-22 DIAGNOSIS — I4892 Unspecified atrial flutter: Secondary | ICD-10-CM

## 2012-09-22 DIAGNOSIS — Z01818 Encounter for other preprocedural examination: Secondary | ICD-10-CM

## 2012-10-08 ENCOUNTER — Ambulatory Visit (HOSPITAL_COMMUNITY)
Admission: RE | Admit: 2012-10-08 | Discharge: 2012-10-08 | Disposition: A | Discharge status: Home / Self Care | Payer: Medicare Other | Source: Ambulatory Visit | Attending: Cardiovascular Disease | Admitting: Cardiovascular Disease

## 2012-10-08 DIAGNOSIS — E663 Overweight: Secondary | ICD-10-CM | POA: Insufficient documentation

## 2012-10-08 DIAGNOSIS — Z01818 Encounter for other preprocedural examination: Secondary | ICD-10-CM

## 2012-10-08 DIAGNOSIS — F17200 Nicotine dependence, unspecified, uncomplicated: Secondary | ICD-10-CM | POA: Insufficient documentation

## 2012-10-08 DIAGNOSIS — I4892 Unspecified atrial flutter: Secondary | ICD-10-CM

## 2012-10-08 DIAGNOSIS — E119 Type 2 diabetes mellitus without complications: Secondary | ICD-10-CM | POA: Insufficient documentation

## 2012-10-08 DIAGNOSIS — I10 Essential (primary) hypertension: Secondary | ICD-10-CM | POA: Insufficient documentation

## 2012-10-08 HISTORY — PX: NM MYOCAR MULTIPLE W/SPECT: HXRAD626

## 2012-10-08 HISTORY — PX: TRANSTHORACIC ECHOCARDIOGRAM: SHX275

## 2012-10-08 MED ORDER — TECHNETIUM TC 99M SESTAMIBI GENERIC - CARDIOLITE
11.0000 | Freq: Once | INTRAVENOUS | Status: AC | PRN
  Administered 2012-10-08: 11 via INTRAVENOUS

## 2012-10-08 MED ORDER — AMINOPHYLLINE 25 MG/ML IV SOLN
75.0000 mg | Freq: Once | INTRAVENOUS | Status: AC
  Administered 2012-10-08: 75 mg via INTRAVENOUS

## 2012-10-08 MED ORDER — REGADENOSON 0.4 MG/5ML IV SOLN
0.4000 mg | Freq: Once | INTRAVENOUS | Status: AC
  Administered 2012-10-08: 0.4 mg via INTRAVENOUS

## 2012-10-08 MED ORDER — TECHNETIUM TC 99M SESTAMIBI GENERIC - CARDIOLITE
30.2000 | Freq: Once | INTRAVENOUS | Status: AC | PRN
  Administered 2012-10-08: 30.2 via INTRAVENOUS

## 2012-10-08 NOTE — Procedures (Addendum)
[facility] [address] [place] [address] [phone]  Cardiology Nuclear Med Study  [name] is a 78 y.o. male     MRN : [id number]     DOB: Jun 15, 1930  Procedure Date: 10/08/2012  Nuclear Med Background Indication for Stress Test:  Surgical Clearance and Abnormal EKG History:  PVD Cardiac Risk Factors: Hypertension, Lipids, NIDDM, Overweight and Smoker  Symptoms:  none   Nuclear Pre-Procedure Caffeine/Decaff Intake:  10:00pm NPO After: 7:30am   IV Site: R Antecubital  IV 0.9% NS with Angio Cath:  22g  Chest Size (in):  46 IV Started by: Koren Shiver, CNMT  Height: 6\' 1"  (1.854 m)  Cup Size: n/a  BMI:  Body mass index is 27.44 kg/(m^2). Weight:  208 lb (94.348 kg)   Tech Comments:  n/a    Nuclear Med Study 1 or 2 day study: 1 day  Stress Test Type:  Lexiscan  Order Authorizing Provider:  Nanetta Batty, MD   Resting Radionuclide: Technetium 35m Sestamibi  Resting Radionuclide Dose: 11.0 mCi   Stress Radionuclide:  Technetium 47m Sestamibi  Stress Radionuclide Dose: 30.2 mCi           Stress Protocol Rest HR: 74 Stress HR: 93  Rest BP: 181/92 Stress BP:184/71  Exercise Time (min): n/a METS: n/a          Dose of Adenosine (mg):  n/a Dose of Lexiscan: 0.4 mg  Dose of Atropine (mg): n/a Dose of Dobutamine: n/a mcg/kg/min (at max HR)  Stress Test Technologist: Ernestene Mention, CCT Nuclear Technologist: Gonzella Lex, CNMT   Rest Procedure:  Myocardial perfusion imaging was performed at rest 45 minutes following the intravenous administration of Technetium 63m Sestamibi. Stress Procedure:  The patient received IV Lexiscan 0.4 mg over 15-seconds.  Technetium 54m Sestamibi injected at 30-seconds.  Patient was given 75 mg of aminophylline through his IV due to his dizziness and shortness of breath. Symptoms were resolved after a six minute recovery period.There were no significant changes with Lexiscan.   Quantitative spect images were obtained after a 45 minute delay.  Transient Ischemic Dilatation (Normal <1.22):  1.14 Lung/Heart Ratio (Normal <0.45):  0.26 QGS EDV:  94 ml QGS ESV:  38 ml LV Ejection Fraction: 60%  Signed by       Rest ECG: IRBBB  Stress ECG: No significant change from baseline ECG  QPS Raw Data Images:  Normal; no motion artifact; normal heart/lung ratio. Stress Images:  Normal homogeneous uptake in all areas of the myocardium. Rest Images:  Comparison with the stress images reveals no significant change. Subtraction (SDS):  No evidence of ischemia.  Impression Exercise Capacity:  Lexiscan with no exercise. BP Response:  Normal blood pressure response. Clinical Symptoms:  No significant symptoms noted. ECG Impression:  No significant ST segment change suggestive of ischemia. Comparison with Prior Nuclear Study: No images to compare  Overall Impression:  Normal stress nuclear study.  LV Wall Motion:  NL LV Function; NL Wall Motion   Runell Gess, MD  10/08/2012 6:05 PM

## 2012-10-08 NOTE — Progress Notes (Signed)
[facility]   2D echo completed 10/08/2012.   Cindy Mc Bloodworth, RDCS   

## 2012-11-05 ENCOUNTER — Encounter (HOSPITAL_COMMUNITY): Payer: Self-pay | Admitting: Pharmacy Technician

## 2012-11-10 ENCOUNTER — Ambulatory Visit (HOSPITAL_COMMUNITY)
Admission: RE | Admit: 2012-11-10 | Discharge: 2012-11-10 | Disposition: A | Discharge status: Home / Self Care | Payer: Medicare Other | Source: Ambulatory Visit | Attending: Surgical | Admitting: Surgical

## 2012-11-10 ENCOUNTER — Encounter (HOSPITAL_COMMUNITY)
Admission: RE | Admit: 2012-11-10 | Discharge: 2012-11-10 | Disposition: A | Discharge status: Home / Self Care | Payer: Medicare Other | Source: Ambulatory Visit | Attending: Orthopedic Surgery | Admitting: Orthopedic Surgery

## 2012-11-10 ENCOUNTER — Encounter (HOSPITAL_COMMUNITY): Payer: Self-pay

## 2012-11-10 DIAGNOSIS — Z87891 Personal history of nicotine dependence: Secondary | ICD-10-CM | POA: Insufficient documentation

## 2012-11-10 DIAGNOSIS — Z01812 Encounter for preprocedural laboratory examination: Secondary | ICD-10-CM | POA: Insufficient documentation

## 2012-11-10 DIAGNOSIS — I700 Atherosclerosis of aorta: Secondary | ICD-10-CM | POA: Insufficient documentation

## 2012-11-10 DIAGNOSIS — Z01818 Encounter for other preprocedural examination: Secondary | ICD-10-CM | POA: Insufficient documentation

## 2012-11-10 DIAGNOSIS — H9190 Unspecified hearing loss, unspecified ear: Secondary | ICD-10-CM

## 2012-11-10 DIAGNOSIS — I10 Essential (primary) hypertension: Secondary | ICD-10-CM | POA: Insufficient documentation

## 2012-11-10 DIAGNOSIS — R0689 Other abnormalities of breathing: Secondary | ICD-10-CM

## 2012-11-10 HISTORY — DX: Unspecified hearing loss, unspecified ear: H91.90

## 2012-11-10 HISTORY — DX: Cardiac arrhythmia, unspecified: I49.9

## 2012-11-10 HISTORY — DX: Other abnormalities of breathing: R06.89

## 2012-11-10 HISTORY — DX: Personal history of other diseases of the digestive system: Z87.19

## 2012-11-10 LAB — COMPREHENSIVE METABOLIC PANEL
ALT: 24 U/L (ref 0–53)
AST: 18 U/L (ref 0–37)
Albumin: 3.6 g/dL (ref 3.5–5.2)
Alkaline Phosphatase: 67 U/L (ref 39–117)
BUN: 17 mg/dL (ref 6–23)
CO2: 27 mEq/L (ref 19–32)
Calcium: 9.8 mg/dL (ref 8.4–10.5)
Chloride: 104 mEq/L (ref 96–112)
Creatinine, Ser: 0.78 mg/dL (ref 0.50–1.35)
GFR calc Af Amer: >90 mL/min (ref >90)
GFR calc non Af Amer: 83 mL/min — ABNORMAL LOW (ref >90)
Glucose, Bld: 203 mg/dL — ABNORMAL HIGH (ref 70–99)
Potassium: 3.9 mEq/L (ref 3.5–5.1)
Sodium: 140 mEq/L (ref 135–145)
Total Bilirubin: 0.4 mg/dL (ref 0.3–1.2)
Total Protein: 6.8 g/dL (ref 6.0–8.3)

## 2012-11-10 LAB — CBC
HCT: 41.4 % (ref 39.0–52.0)
Hemoglobin: 13.9 g/dL (ref 13.0–17.0)
MCV: 92.8 fL (ref 78.0–100.0)
Platelets: 231 10*3/uL (ref 150–400)
RBC: 4.46 MIL/uL (ref 4.22–5.81)
WBC: 8.8 10*3/uL (ref 4.0–10.5)

## 2012-11-10 LAB — URINALYSIS, ROUTINE W REFLEX MICROSCOPIC
Bilirubin Urine: NEGATIVE
Glucose, UA: >1000 mg/dL — AB
Hgb urine dipstick: NEGATIVE
Ketones, ur: NEGATIVE mg/dL
Leukocytes, UA: NEGATIVE
Nitrite: NEGATIVE
Protein, ur: 30 mg/dL — AB
Specific Gravity, Urine: 1.027 (ref 1.005–1.030)
Urobilinogen, UA: 0.2 mg/dL (ref 0.0–1.0)
pH: 5 (ref 5.0–8.0)

## 2012-11-10 LAB — ABO/RH: ABO/RH(D): A POS

## 2012-11-10 LAB — APTT: aPTT: 27 seconds (ref 24–37)

## 2012-11-10 LAB — URINE MICROSCOPIC-ADD ON

## 2012-11-10 LAB — PROTIME-INR
INR: 0.92 (ref 0.00–1.49)
Prothrombin Time: 12.3 seconds (ref 11.6–15.2)

## 2012-11-10 NOTE — Patient Instructions (Addendum)
20 [name]  11/10/2012   Your procedure is scheduled on:  1-29 -2014  Report to [facility] at      0530  AM.  Call this number if you have problems the morning of surgery: [phone]  Or Presurgical Testing [phone]([name])    Do not eat food:After Midnight.  May have clear liquids:up to 6 Hours before arrival. Nothing after :  Clear liquids include soda, tea, black coffee, apple or grape juice, broth.  Take these medicines the morning of surgery with A SIP OF WATER: Tylenol if needed. Take no diabetic meds AM of surgery. Night of 11-17-12 use 1/2 dose Levemir insulin(5 units only) -donot take any insulin AM of surgery.   Do not wear jewelry, make-up or nail polish.  Do not wear lotions, powders, or perfumes. You may wear deodorant.  Do not shave 12 hours prior to first CHG shower(legs and under arms).(face and neck okay.)  Do not bring valuables to the hospital.  Contacts, dentures or bridgework,body piercing,  may not be worn into surgery.  Leave suitcase in the car. After surgery it may be brought to your room.  For patients admitted to the hospital, checkout time is 11:00 AM the day of discharge.   Patients discharged the day of surgery will not be allowed to drive home. Must have responsible person with you x 24 hours once discharged.  Name and phone number of your driver: [name], spouse [phone] cell  Special Instructions: CHG Shower Use Special Wash: see special instructions.(avoid face and genitals)   Please read over the following fact sheets that you were given: MRSA Information, Blood Transfusion fact sheet, Incentive Spirometry Instruction.    Failure to follow these instructions may result in Cancellation of your surgery.   Patient signature_______________________________________________________

## 2012-11-10 NOTE — Pre-Procedure Instructions (Signed)
11-10-12 EKG12'13-report with chart. CXR done today.

## 2012-11-12 NOTE — H&P (Signed)
TOTAL KNEE ADMISSION H&P  Patient is being admitted for right total knee arthroplasty.  Subjective:  Chief Complaint:right knee pain.  HPI: [name], 79 y.o. male, has a history of pain and functional disability in the right knee due to arthritis and has failed non-surgical conservative treatments for greater than 12 weeks to includeNSAID's and/or analgesics, corticosteriod injections, flexibility and strengthening excercises and activity modification.  Onset of symptoms was gradual, starting 5 years ago with gradually worsening course since that time. The patient noted no past surgery on the right knee(s).  Patient currently rates pain in the right knee(s) at 7 out of 10 with activity. Patient has night pain, worsening of pain with activity and weight bearing, pain that interferes with activities of daily living, pain with passive range of motion, crepitus and joint swelling.  Patient has evidence of subchondral sclerosis, periarticular osteophytes and joint space narrowing by imaging studies. There is no active infection.  Patient Active Problem List   Diagnosis Date Noted  . Occlusion and stenosis of carotid artery without mention of cerebral infarction 06/30/2012   Past Medical History  Diagnosis Date  . Diabetes mellitus age 58  . Hyperlipidemia   . Hypertension   . Bleeding nose     occasional  . Change in hearing   . Joint pain   . Ulcer   . Carotid artery occlusion   . Dysrhythmia     hx. Atrial Flutter x1, converted spontaneously-no ploblems.   Marland Kitchen HOH (hard of hearing) 11-10-12    bilaterally  . Breathing difficulty 11-10-12    difficult to breath if laying posteriorly, right nare difficulty  . H/O hiatal hernia   . Arthritis     arthritis -back knees, most joints  . Anemia     related to frequent nosebleeds-right nostril.    Past Surgical History  Procedure Date  . Carotid endarterectomy 11/242008    right with Dacron patch angioplasty  . Cataract extraction 1998  bilateral  done 3 months apart     Current outpatient prescriptions acetaminophen (TYLENOL) 500 MG tablet, Take 500 mg by mouth every 6 (six) hours as needed. Pain, Disp: , Rfl: ;   atorvastatin (LIPITOR) 80 MG tablet, Take 40 mg by mouth every evening. Takes 1/2, Disp: , Rfl: ;  fish oil-omega-3 fatty acids 1000 MG capsule, Take 2 g by mouth daily. , Disp: , Rfl:  insulin detemir (LEVEMIR) 100 UNIT/ML injection, Inject 10-40 Units into the skin 2 (two) times daily. Takes 40 units in the morning and 10 units at night, Disp: , Rfl: ;   losartan (COZAAR) 100 MG tablet, Take 100 mg by mouth daily before breakfast. , Disp: , Rfl: ;  metFORMIN (GLUCOPHAGE) 1000 MG tablet, Take 1,000 mg by mouth 2 (two) times daily with a meal., Disp: , Rfl:  traZODone (DESYREL) 100 MG tablet, Take 100 mg by mouth at bedtime.  , Disp: , Rfl:   No Known Allergies  History  Substance Use Topics  . Smoking status: Former Smoker    Types: Cigarettes    Quit date: 10/21/1976  . Smokeless tobacco: Not on file  . Alcohol Use: Yes     Comment: 1-2 alcoholic drinks daily    Family History  Problem Relation Age of Onset  . Cancer Mother     Pancreatic   Siblings- Colon cancer, Alzheimer's pancreatic cancer, breast cancer, stroke  Review of Systems  Constitutional: Negative.   HENT: Positive for hearing loss. Negative for ear pain, nosebleeds,  sore throat, neck pain, tinnitus and ear discharge.   Eyes: Negative.   Respiratory: Positive for shortness of breath. Negative for cough, hemoptysis, sputum production, wheezing and stridor.   Cardiovascular: Negative.   Gastrointestinal: Positive for heartburn. Negative for nausea, vomiting, abdominal pain, diarrhea, constipation, blood in stool and melena.  Genitourinary: Negative.   Musculoskeletal: Positive for back pain and joint pain. Negative for myalgias and falls.       Right knee pain   Skin: Positive for itching. Negative for rash.  Neurological: Negative.   Negative for headaches.  Endo/Heme/Allergies: Negative.   Psychiatric/Behavioral: Negative.     Objective:  Physical Exam  Constitutional: He is oriented to person, place, and time. He appears well-developed and well-nourished. No distress.  HENT:  Head: Normocephalic and atraumatic.  Right Ear: External ear normal.  Left Ear: External ear normal.  Nose: Nose normal.  Eyes: Conjunctivae normal and EOM are normal.  Neck: Normal range of motion. Neck supple. No tracheal deviation present. No thyromegaly present.  Cardiovascular: Normal rate, regular rhythm, normal heart sounds and intact distal pulses.   No murmur heard. Respiratory: Effort normal and breath sounds normal. No respiratory distress. He has no wheezes. He exhibits no tenderness.  GI: Soft. Bowel sounds are normal. He exhibits no mass. There is no tenderness. There is no rebound.  Musculoskeletal:       Right hip: Normal.       Left hip: Normal.       Right knee: He exhibits decreased range of motion and swelling. He exhibits no erythema. tenderness found. Medial joint line tenderness noted.       Left knee: Normal.       Right lower leg: He exhibits no tenderness and no swelling.       Left lower leg: He exhibits no tenderness and no swelling.       Legs: Lymphadenopathy:    He has no cervical adenopathy.  Neurological: He is oriented to person, place, and time. He has normal strength and normal reflexes. No sensory deficit.  Skin: No rash noted. He is not diaphoretic. No erythema.  Psychiatric: He has a normal mood and affect. His behavior is normal.    Vitals Weight: 207 lb Height: 72.5 in Body Surface Area: 2.19 m Body Mass Index: 27.69 kg/m Pulse: 84 (Regular) BP: 167/69 (Sitting, Left Arm, Standard)  Estimated Body mass index is 27.44 kg/(m^2) as calculated from the following:   Height as of 10/08/12: 6\' 1" (1.854 m).   Weight as of 10/08/12: 208 lb(94.348 kg).   Imaging Review Plain  radiographs demonstrate severe degenerative joint disease of the right knee(s). The overall alignment ismild varus. The bone quality appears to be good for age and reported activity level.  Assessment/Plan:  End stage arthritis, right knee   The patient history, physical examination, clinical judgment of the provider and imaging studies are consistent with end stage degenerative joint disease of the right knee(s) and total knee arthroplasty is deemed medically necessary. The treatment options including medical management, injection therapy arthroscopy and arthroplasty were discussed at length. The risks and benefits of total knee arthroplasty were presented and reviewed. The risks due to aseptic loosening, infection, stiffness, patella tracking problems, thromboembolic complications and other imponderables were discussed. The patient acknowledged the explanation, agreed to proceed with the plan and consent was signed. Patient is being admitted for inpatient treatment for surgery, pain control, PT, OT, prophylactic antibiotics, VTE prophylaxis, progressive ambulation and ADL's and discharge planning. The patient  is planning to be discharged to skilled nursing facility       [facility], [place]

## 2012-11-17 MED ORDER — CEFAZOLIN SODIUM-DEXTROSE 2-3 GM-% IV SOLR
2.0000 g | INTRAVENOUS | Status: AC
  Administered 2012-11-18: 2 g via INTRAVENOUS

## 2012-11-18 ENCOUNTER — Inpatient Hospital Stay (HOSPITAL_COMMUNITY): Payer: Medicare Other

## 2012-11-18 ENCOUNTER — Inpatient Hospital Stay (HOSPITAL_COMMUNITY)
Admission: RE | Admit: 2012-11-18 | Discharge: 2012-11-21 | DRG: 470 | Disposition: A | Discharge status: Skilled Nursing Facility | Payer: Medicare Other | Source: Ambulatory Visit | Attending: Orthopedic Surgery | Admitting: Orthopedic Surgery

## 2012-11-18 ENCOUNTER — Encounter (HOSPITAL_COMMUNITY): Payer: Self-pay | Admitting: Surgery

## 2012-11-18 ENCOUNTER — Inpatient Hospital Stay (HOSPITAL_COMMUNITY): Payer: Medicare Other | Admitting: Anesthesiology

## 2012-11-18 ENCOUNTER — Encounter (HOSPITAL_COMMUNITY): Payer: Self-pay | Admitting: Anesthesiology

## 2012-11-18 ENCOUNTER — Encounter (HOSPITAL_COMMUNITY)
Admission: RE | Disposition: A | Discharge status: Skilled Nursing Facility | Payer: Self-pay | Source: Ambulatory Visit | Attending: Orthopedic Surgery

## 2012-11-18 ENCOUNTER — Encounter (HOSPITAL_COMMUNITY): Payer: Self-pay | Admitting: *Deleted

## 2012-11-18 DIAGNOSIS — Z96659 Presence of unspecified artificial knee joint: Secondary | ICD-10-CM

## 2012-11-18 DIAGNOSIS — M1710 Unilateral primary osteoarthritis, unspecified knee: Principal | ICD-10-CM | POA: Diagnosis present

## 2012-11-18 DIAGNOSIS — M1711 Unilateral primary osteoarthritis, right knee: Secondary | ICD-10-CM | POA: Diagnosis present

## 2012-11-18 DIAGNOSIS — E785 Hyperlipidemia, unspecified: Secondary | ICD-10-CM | POA: Diagnosis present

## 2012-11-18 DIAGNOSIS — E119 Type 2 diabetes mellitus without complications: Secondary | ICD-10-CM | POA: Diagnosis present

## 2012-11-18 DIAGNOSIS — D1779 Benign lipomatous neoplasm of other sites: Secondary | ICD-10-CM | POA: Diagnosis present

## 2012-11-18 DIAGNOSIS — I10 Essential (primary) hypertension: Secondary | ICD-10-CM | POA: Diagnosis present

## 2012-11-18 DIAGNOSIS — I4891 Unspecified atrial fibrillation: Secondary | ICD-10-CM | POA: Diagnosis present

## 2012-11-18 HISTORY — PX: KNEE BURSECTOMY: SHX5882

## 2012-11-18 HISTORY — PX: TOTAL KNEE ARTHROPLASTY: SHX125

## 2012-11-18 HISTORY — PX: JOINT REPLACEMENT: SHX530

## 2012-11-18 LAB — TYPE AND SCREEN
ABO/RH(D): A POS
Antibody Screen: NEGATIVE

## 2012-11-18 LAB — GLUCOSE, CAPILLARY
Glucose-Capillary: 242 mg/dL — ABNORMAL HIGH (ref 70–99)
Glucose-Capillary: 247 mg/dL — ABNORMAL HIGH (ref 70–99)
Glucose-Capillary: 301 mg/dL — ABNORMAL HIGH (ref 70–99)

## 2012-11-18 SURGERY — ARTHROPLASTY, KNEE, TOTAL
Anesthesia: General | Site: Knee | Laterality: Right | Wound class: Clean

## 2012-11-18 MED ORDER — SODIUM CHLORIDE 0.9 % IJ SOLN
INTRAMUSCULAR | Status: DC | PRN
  Administered 2012-11-18: 09:00:00

## 2012-11-18 MED ORDER — RIVAROXABAN 10 MG PO TABS
10.0000 mg | ORAL_TABLET | Freq: Every day | ORAL | Status: DC
  Administered 2012-11-19 – 2012-11-21 (×3): 10 mg via ORAL
  Filled 2012-11-18 (×4): qty 1

## 2012-11-18 MED ORDER — LACTATED RINGERS IV SOLN
INTRAVENOUS | Status: DC | PRN
  Administered 2012-11-18 (×2): via INTRAVENOUS

## 2012-11-18 MED ORDER — PHENOL 1.4 % MT LIQD
1.0000 | OROMUCOSAL | Status: DC | PRN
  Filled 2012-11-18: qty 177

## 2012-11-18 MED ORDER — PROMETHAZINE HCL 25 MG/ML IJ SOLN: 6.2500 mg | INTRAMUSCULAR | Status: DC | PRN

## 2012-11-18 MED ORDER — LACTATED RINGERS IV SOLN
INTRAVENOUS | Status: DC
  Administered 2012-11-18 (×2): via INTRAVENOUS
  Administered 2012-11-18: 1000 mL via INTRAVENOUS

## 2012-11-18 MED ORDER — FLEET ENEMA 7-19 GM/118ML RE ENEM: 1.0000 | ENEMA | Freq: Once | RECTAL | Status: AC | PRN

## 2012-11-18 MED ORDER — PROPOFOL 10 MG/ML IV BOLUS
INTRAVENOUS | Status: DC | PRN
  Administered 2012-11-18: 140 mg via INTRAVENOUS

## 2012-11-18 MED ORDER — CEFAZOLIN SODIUM 1-5 GM-% IV SOLN
1.0000 g | Freq: Four times a day (QID) | INTRAVENOUS | Status: AC
  Administered 2012-11-18 (×2): 1 g via INTRAVENOUS
  Filled 2012-11-18 (×2): qty 50

## 2012-11-18 MED ORDER — LACTATED RINGERS IV SOLN: INTRAVENOUS | Status: DC

## 2012-11-18 MED ORDER — FERROUS SULFATE 325 (65 FE) MG PO TABS
325.0000 mg | ORAL_TABLET | Freq: Three times a day (TID) | ORAL | Status: DC
  Administered 2012-11-18 – 2012-11-21 (×9): 325 mg via ORAL
  Filled 2012-11-18 (×12): qty 1

## 2012-11-18 MED ORDER — ONDANSETRON HCL 4 MG PO TABS: 4.0000 mg | ORAL_TABLET | Freq: Four times a day (QID) | ORAL | Status: DC | PRN

## 2012-11-18 MED ORDER — MENTHOL 3 MG MT LOZG
1.0000 | LOZENGE | OROMUCOSAL | Status: DC | PRN
  Filled 2012-11-18: qty 9

## 2012-11-18 MED ORDER — HYDROMORPHONE HCL PF 1 MG/ML IJ SOLN: 1.0000 mg | INTRAMUSCULAR | Status: DC | PRN

## 2012-11-18 MED ORDER — BISACODYL 10 MG RE SUPP: 10.0000 mg | Freq: Every day | RECTAL | Status: DC | PRN

## 2012-11-18 MED ORDER — ONDANSETRON HCL 4 MG/2ML IJ SOLN: 4.0000 mg | Freq: Four times a day (QID) | INTRAMUSCULAR | Status: DC | PRN

## 2012-11-18 MED ORDER — SUCCINYLCHOLINE CHLORIDE 20 MG/ML IJ SOLN
INTRAMUSCULAR | Status: DC | PRN
  Administered 2012-11-18: 100 mg via INTRAVENOUS

## 2012-11-18 MED ORDER — SODIUM CHLORIDE 0.9 % IR SOLN
Status: DC | PRN
  Administered 2012-11-18: 3000 mL

## 2012-11-18 MED ORDER — SODIUM CHLORIDE 0.9 % IR SOLN
Status: DC | PRN
  Administered 2012-11-18: 09:00:00

## 2012-11-18 MED ORDER — METFORMIN HCL 500 MG PO TABS
1000.0000 mg | ORAL_TABLET | Freq: Two times a day (BID) | ORAL | Status: DC
  Administered 2012-11-18 – 2012-11-21 (×6): 1000 mg via ORAL
  Filled 2012-11-18 (×8): qty 2

## 2012-11-18 MED ORDER — HYDROMORPHONE HCL PF 1 MG/ML IJ SOLN
0.2500 mg | INTRAMUSCULAR | Status: DC | PRN
  Administered 2012-11-18 (×2): 0.5 mg via INTRAVENOUS

## 2012-11-18 MED ORDER — CHLORHEXIDINE GLUCONATE 4 % EX LIQD: 60.0000 mL | Freq: Once | CUTANEOUS | Status: DC

## 2012-11-18 MED ORDER — METHOCARBAMOL 100 MG/ML IJ SOLN
500.0000 mg | Freq: Four times a day (QID) | INTRAVENOUS | Status: DC | PRN
  Administered 2012-11-18: 500 mg via INTRAVENOUS
  Filled 2012-11-18: qty 5

## 2012-11-18 MED ORDER — ALUM & MAG HYDROXIDE-SIMETH 200-200-20 MG/5ML PO SUSP: 30.0000 mL | ORAL | Status: DC | PRN

## 2012-11-18 MED ORDER — BUPIVACAINE LIPOSOME 1.3 % IJ SUSP
20.0000 mL | INTRAMUSCULAR | Status: DC
  Filled 2012-11-18: qty 20

## 2012-11-18 MED ORDER — OXYCODONE-ACETAMINOPHEN 5-325 MG PO TABS
1.0000 | ORAL_TABLET | ORAL | Status: DC | PRN
  Administered 2012-11-21: 1 via ORAL
  Filled 2012-11-18: qty 1

## 2012-11-18 MED ORDER — MEPERIDINE HCL 50 MG/ML IJ SOLN: 6.2500 mg | INTRAMUSCULAR | Status: DC | PRN

## 2012-11-18 MED ORDER — LOSARTAN POTASSIUM 50 MG PO TABS
100.0000 mg | ORAL_TABLET | Freq: Every day | ORAL | Status: DC
  Administered 2012-11-19 – 2012-11-21 (×3): 100 mg via ORAL
  Filled 2012-11-18 (×4): qty 2

## 2012-11-18 MED ORDER — HYDROCODONE-ACETAMINOPHEN 5-325 MG PO TABS
1.0000 | ORAL_TABLET | ORAL | Status: DC | PRN
  Administered 2012-11-18: 2 via ORAL
  Administered 2012-11-19 (×4): 1 via ORAL
  Administered 2012-11-19: 2 via ORAL
  Administered 2012-11-20 – 2012-11-21 (×6): 1 via ORAL
  Filled 2012-11-18 (×6): qty 1
  Filled 2012-11-18: qty 2
  Filled 2012-11-18 (×2): qty 1
  Filled 2012-11-18: qty 2
  Filled 2012-11-18 (×2): qty 1

## 2012-11-18 MED ORDER — INSULIN ASPART 100 UNIT/ML ~~LOC~~ SOLN
5.0000 [IU] | Freq: Once | SUBCUTANEOUS | Status: AC
  Administered 2012-11-18: 5 [IU] via SUBCUTANEOUS
  Filled 2012-11-18: qty 1

## 2012-11-18 MED ORDER — POLYETHYLENE GLYCOL 3350 17 G PO PACK: 17.0000 g | PACK | Freq: Every day | ORAL | Status: DC | PRN

## 2012-11-18 MED ORDER — TRAZODONE HCL 100 MG PO TABS
100.0000 mg | ORAL_TABLET | Freq: Every day | ORAL | Status: DC
  Administered 2012-11-18 – 2012-11-20 (×3): 100 mg via ORAL
  Filled 2012-11-18 (×4): qty 1

## 2012-11-18 MED ORDER — ATORVASTATIN CALCIUM 40 MG PO TABS
40.0000 mg | ORAL_TABLET | Freq: Every evening | ORAL | Status: DC
  Administered 2012-11-18 – 2012-11-20 (×3): 40 mg via ORAL
  Filled 2012-11-18 (×4): qty 1

## 2012-11-18 MED ORDER — METHOCARBAMOL 500 MG PO TABS
500.0000 mg | ORAL_TABLET | Freq: Four times a day (QID) | ORAL | Status: DC | PRN
  Administered 2012-11-19 – 2012-11-21 (×3): 500 mg via ORAL
  Filled 2012-11-18 (×3): qty 1

## 2012-11-18 MED ORDER — ACETAMINOPHEN 650 MG RE SUPP: 650.0000 mg | Freq: Four times a day (QID) | RECTAL | Status: DC | PRN

## 2012-11-18 MED ORDER — GLYCOPYRROLATE 0.2 MG/ML IJ SOLN
INTRAMUSCULAR | Status: DC | PRN
  Administered 2012-11-18: 0.2 mg via INTRAVENOUS

## 2012-11-18 MED ORDER — ACETAMINOPHEN 325 MG PO TABS: 650.0000 mg | ORAL_TABLET | Freq: Four times a day (QID) | ORAL | Status: DC | PRN

## 2012-11-18 MED ORDER — ACETAMINOPHEN 10 MG/ML IV SOLN
INTRAVENOUS | Status: DC | PRN
  Administered 2012-11-18: 1000 mg via INTRAVENOUS

## 2012-11-18 MED ORDER — INSULIN ASPART 100 UNIT/ML ~~LOC~~ SOLN
0.0000 [IU] | Freq: Three times a day (TID) | SUBCUTANEOUS | Status: DC
  Administered 2012-11-18 – 2012-11-19 (×2): 5 [IU] via SUBCUTANEOUS
  Administered 2012-11-19: 11 [IU] via SUBCUTANEOUS
  Administered 2012-11-19: 8 [IU] via SUBCUTANEOUS
  Administered 2012-11-20: 3 [IU] via SUBCUTANEOUS
  Administered 2012-11-20: 5 [IU] via SUBCUTANEOUS
  Administered 2012-11-20: 3 [IU] via SUBCUTANEOUS

## 2012-11-18 MED ORDER — CELECOXIB 200 MG PO CAPS
200.0000 mg | ORAL_CAPSULE | Freq: Two times a day (BID) | ORAL | Status: DC
  Administered 2012-11-18 – 2012-11-21 (×7): 200 mg via ORAL
  Filled 2012-11-18 (×8): qty 1

## 2012-11-18 MED ORDER — FENTANYL CITRATE 0.05 MG/ML IJ SOLN
INTRAMUSCULAR | Status: DC | PRN
  Administered 2012-11-18: 50 ug via INTRAVENOUS
  Administered 2012-11-18: 100 ug via INTRAVENOUS
  Administered 2012-11-18 (×2): 50 ug via INTRAVENOUS

## 2012-11-18 SURGICAL SUPPLY — 75 items
BAG SPEC THK2 15X12 ZIP CLS (MISCELLANEOUS) ×1
BAG ZIPLOCK 12X15 (MISCELLANEOUS) ×2 IMPLANT
BANDAGE ELASTIC 4 VELCRO ST LF (GAUZE/BANDAGES/DRESSINGS) ×2 IMPLANT
BANDAGE ELASTIC 6 VELCRO ST LF (GAUZE/BANDAGES/DRESSINGS) ×2 IMPLANT
BANDAGE ESMARK 6X9 LF (GAUZE/BANDAGES/DRESSINGS) ×1 IMPLANT
BANDAGE GAUZE ELAST BULKY 4 IN (GAUZE/BANDAGES/DRESSINGS) ×2 IMPLANT
BLADE SAG 18X100X1.27 (BLADE) ×2 IMPLANT
BLADE SAW SGTL 11.0X1.19X90.0M (BLADE) ×2 IMPLANT
BNDG CMPR 9X6 STRL LF SNTH (GAUZE/BANDAGES/DRESSINGS) ×1
BNDG COHESIVE 4X5 TAN STRL (GAUZE/BANDAGES/DRESSINGS) ×2 IMPLANT
BNDG ESMARK 6X9 LF (GAUZE/BANDAGES/DRESSINGS) ×2
BONE CEMENT GENTAMICIN (Cement) ×4 IMPLANT
CEMENT BONE GENTAMICIN 40 (Cement) ×2 IMPLANT
CLOTH BEACON ORANGE TIMEOUT ST (SAFETY) ×2 IMPLANT
CLSR STERI-STRIP ANTIMIC 1/2X4 (GAUZE/BANDAGES/DRESSINGS) ×2 IMPLANT
CUFF TOURN SGL QUICK 34 (TOURNIQUET CUFF) ×2
CUFF TRNQT CYL 34X4X40X1 (TOURNIQUET CUFF) ×1 IMPLANT
DRAPE EXTREMITY T 121X128X90 (DRAPE) ×2 IMPLANT
DRAPE INCISE IOBAN 66X45 STRL (DRAPES) ×2 IMPLANT
DRAPE LG THREE QUARTER DISP (DRAPES) ×2 IMPLANT
DRAPE POUCH INSTRU U-SHP 10X18 (DRAPES) ×2 IMPLANT
DRAPE U-SHAPE 47X51 STRL (DRAPES) ×2 IMPLANT
DRSG ADAPTIC 3X8 NADH LF (GAUZE/BANDAGES/DRESSINGS) ×2 IMPLANT
DRSG EMULSION OIL 3X16 NADH (GAUZE/BANDAGES/DRESSINGS) ×2 IMPLANT
DRSG PAD ABDOMINAL 8X10 ST (GAUZE/BANDAGES/DRESSINGS) ×2 IMPLANT
DURAPREP 26ML APPLICATOR (WOUND CARE) ×2 IMPLANT
ELECT REM PT RETURN 9FT ADLT (ELECTROSURGICAL) ×2
ELECTRODE REM PT RTRN 9FT ADLT (ELECTROSURGICAL) ×1 IMPLANT
EVACUATOR 1/8 PVC DRAIN (DRAIN) ×2 IMPLANT
FACESHIELD LNG OPTICON STERILE (SAFETY) ×16 IMPLANT
GLOVE BIOGEL PI IND STRL 8 (GLOVE) ×1 IMPLANT
GLOVE BIOGEL PI IND STRL 8.5 (GLOVE) ×1 IMPLANT
GLOVE BIOGEL PI INDICATOR 8 (GLOVE) ×1
GLOVE BIOGEL PI INDICATOR 8.5 (GLOVE) ×1
GLOVE ECLIPSE 8.0 STRL XLNG CF (GLOVE) ×4 IMPLANT
GLOVE SURG SS PI 6.5 STRL IVOR (GLOVE) ×4 IMPLANT
GOWN PREVENTION PLUS LG XLONG (DISPOSABLE) ×4 IMPLANT
GOWN STRL REIN XL XLG (GOWN DISPOSABLE) ×4 IMPLANT
HANDPIECE INTERPULSE COAX TIP (DISPOSABLE) ×2
IMMOBILIZER KNEE 20 (SOFTGOODS) ×2
IMMOBILIZER KNEE 20 THIGH 36 (SOFTGOODS) ×1 IMPLANT
KIT BASIN OR (CUSTOM PROCEDURE TRAY) ×2 IMPLANT
MANIFOLD NEPTUNE II (INSTRUMENTS) ×2 IMPLANT
NEEDLE HYPO 22GX1.5 SAFETY (NEEDLE) ×2 IMPLANT
NS IRRIG 1000ML POUR BTL (IV SOLUTION) ×2 IMPLANT
PACK LOWER EXTREMITY WL (CUSTOM PROCEDURE TRAY) ×2 IMPLANT
PACK TOTAL JOINT (CUSTOM PROCEDURE TRAY) ×2 IMPLANT
PAD CAST 4YDX4 CTTN HI CHSV (CAST SUPPLIES) ×1 IMPLANT
PADDING CAST COTTON 4X4 STRL (CAST SUPPLIES) ×2
POSITIONER SURGICAL ARM (MISCELLANEOUS) ×2 IMPLANT
SET HNDPC FAN SPRY TIP SCT (DISPOSABLE) ×1 IMPLANT
SET PAD KNEE POSITIONER (MISCELLANEOUS) ×2 IMPLANT
SPONGE GAUZE 4X4 12PLY (GAUZE/BANDAGES/DRESSINGS) ×2 IMPLANT
SPONGE LAP 18X18 X RAY DECT (DISPOSABLE) ×2 IMPLANT
SPONGE LAP 4X18 X RAY DECT (DISPOSABLE) ×2 IMPLANT
SPONGE SURGIFOAM ABS GEL 100 (HEMOSTASIS) ×2 IMPLANT
STAPLER VISISTAT 35W (STAPLE) IMPLANT
STRIP CLOSURE SKIN 1/2X4 (GAUZE/BANDAGES/DRESSINGS) ×4 IMPLANT
SUCTION FRAZIER 12FR DISP (SUCTIONS) ×2 IMPLANT
SUT BONE WAX W31G (SUTURE) ×2 IMPLANT
SUT MNCRL AB 4-0 PS2 18 (SUTURE) ×2 IMPLANT
SUT VIC AB 0 CT1 27 (SUTURE) ×4
SUT VIC AB 0 CT1 27XBRD ANTBC (SUTURE) ×2 IMPLANT
SUT VIC AB 1 CT1 27 (SUTURE) ×4
SUT VIC AB 1 CT1 27XBRD ANTBC (SUTURE) ×2 IMPLANT
SUT VIC AB 2-0 CT1 27 (SUTURE) ×8
SUT VIC AB 2-0 CT1 27XBRD (SUTURE) ×1 IMPLANT
SUT VIC AB 2-0 CT1 TAPERPNT 27 (SUTURE) ×3 IMPLANT
SUT VLOC 180 0 24IN GS25 (SUTURE) ×2 IMPLANT
SYR 20CC LL (SYRINGE) ×2 IMPLANT
TOWEL OR 17X26 10 PK STRL BLUE (TOWEL DISPOSABLE) ×4 IMPLANT
TOWER CARTRIDGE SMART MIX (DISPOSABLE) ×2 IMPLANT
TRAY FOLEY CATH 14FRSI W/METER (CATHETERS) ×2 IMPLANT
WATER STERILE IRR 1500ML POUR (IV SOLUTION) ×2 IMPLANT
WRAP KNEE MAXI GEL POST OP (GAUZE/BANDAGES/DRESSINGS) ×4 IMPLANT

## 2012-11-18 NOTE — Brief Op Note (Signed)
11/18/2012  9:06 AM  PATIENT:  [name]  80 y.o. male  PRE-OPERATIVE DIAGNOSIS:Osteoarthritis right knee And Lipoma, Prepatellar.  POST-OPERATIVE DIAGNOSIS:  Osteoarthritis right knee and Prepatellar Lipoma  PROCEDURE:  Procedure(s) (LRB) with comments: TOTAL KNEE ARTHROPLASTY (Right) KNEE BURSECTOMY (Right),and Excision of Prepatellar Lipoma  SURGEON:  Surgeon(s) and Role:    * Jacki Cones, MD - Primary  PHYSICIAN ASSISTANT:Amber Dunlap PA   ASSISTANTS:Amber Tustin PA   ANESTHESIA:   general  EBL:  Total I/O In: 1000 [I.V.:1000] Out: -   BLOOD ADMINISTERED:none  DRAINS: (one) Hemovact drain(s) in the Right Knee with  Suction Open   LOCAL MEDICATIONS USED:  BUPIVICAINE 20cc mixed with 20 cc of Normal Saline  SPECIMEN:  Source of Specimen:  Prepatellar  DISPOSITION OF SPECIMEN:  PATHOLOGY  COUNTS:  YES  TOURNIQUET:  * Missing tourniquet times found for documented tourniquets in log:  16109 *  DICTATION: .Other Dictation: Dictation Number [id number]  PLAN OF CARE: Admit to inpatient   PATIENT DISPOSITION:  Stable in OR   Delay start of Pharmacological VTE agent (>24hrs) due to surgical blood loss or risk of bleeding: yes

## 2012-11-18 NOTE — H&P (View-Only) (Signed)
Dr. Acey Lav paged re: CBG [phone]

## 2012-11-18 NOTE — Evaluation (Signed)
Physical Therapy Evaluation Patient Details Name: [name] MRN: [id number] DOB: [birth year] Today's Date: 11/18/2012 Time: 4098-1191 PT Time Calculation (min): 18 min  PT Assessment / Plan / Recommendation Clinical Impression  Pt s/p R TKR.  Pt would benefit from acute PT services in order to improve independence with transfers and ambulation by increasing strength and ROM of R LE to prepare for d/c to SNF.      PT Assessment  Patient needs continued PT services    Follow Up Recommendations  SNF    Does the patient have the potential to tolerate intense rehabilitation      Barriers to Discharge        Equipment Recommendations  None recommended by PT    Recommendations for Other Services     Frequency 7X/week    Precautions / Restrictions Precautions Precautions: Knee Required Braces or Orthoses: Knee Immobilizer - Right Knee Immobilizer - Right:  (until discontinued) Restrictions RLE Weight Bearing: Weight bearing as tolerated   Pertinent Vitals/Pain Premedicated prior to therapy, pt reports minimal pain      Mobility  Bed Mobility Bed Mobility: Supine to Sit Supine to Sit: 4: Min assist;HOB elevated Details for Bed Mobility Assistance: assist for R LE, increased time and verbal cues for technique Transfers Transfers: Sit to Stand;Stand to Sit Sit to Stand: 4: Min assist;With upper extremity assist;From bed;From elevated surface Stand to Sit: 4: Min assist;With upper extremity assist;To chair/3-in-1 Details for Transfer Assistance: assist to rise and control descent, verbal cues for safe technique Ambulation/Gait Ambulation/Gait Assistance: 4: Min assist Ambulation Distance (Feet): 24 Feet Assistive device: Rolling walker Ambulation/Gait Assistance Details: verbal cues for safety, sequence, RW distance, turning Gait Pattern: Step-to pattern;Trunk flexed;Decreased stance time - right Gait velocity: decreased    Shoulder Instructions     Exercises     PT  Diagnosis: Difficulty walking;Acute pain  PT Problem List: Decreased strength;Decreased activity tolerance;Decreased range of motion;Decreased mobility;Decreased knowledge of use of DME;Decreased knowledge of precautions;Pain PT Treatment Interventions: DME instruction;Gait training;Patient/family education;Functional mobility training;Therapeutic activities;Therapeutic exercise   PT Goals Acute Rehab PT Goals PT Goal Formulation: With patient Time For Goal Achievement: 11/25/12 Potential to Achieve Goals: Good Pt will go Supine/Side to Sit: with supervision PT Goal: Supine/Side to Sit - Progress: Goal set today Pt will go Sit to Supine/Side: with supervision PT Goal: Sit to Supine/Side - Progress: Goal set today Pt will go Sit to Stand: with supervision PT Goal: Sit to Stand - Progress: Goal set today Pt will go Stand to Sit: with supervision PT Goal: Stand to Sit - Progress: Goal set today Pt will Ambulate: 51 - 150 feet;with supervision;with least restrictive assistive device PT Goal: Ambulate - Progress: Goal set today Pt will Perform Home Exercise Program: with supervision, verbal cues required/provided PT Goal: Perform Home Exercise Program - Progress: Goal set today  Visit Information  Last PT Received On: 11/18/12 Assistance Needed: +1    Subjective Data  Subjective: I can try to walk. Patient Stated Goal: SNF for rehab   Prior Functioning  Home Living Lives With: Spouse Available Help at Discharge: Skilled Nursing Facility Home Adaptive Equipment: Raised toilet seat with rails;Walker - rolling Prior Function Level of Independence: Independent Communication Communication: No difficulties    Cognition  Overall Cognitive Status: Appears within functional limits for tasks assessed/performed Arousal/Alertness: Awake/alert Orientation Level: Appears intact for tasks assessed Behavior During Session: [facility] for tasks performed    Extremity/Trunk Assessment Right Upper  Extremity Assessment RUE ROM/Strength/Tone: [facility] for  tasks assessed Left Upper Extremity Assessment LUE ROM/Strength/Tone: WFL for tasks assessed Right Lower Extremity Assessment RLE ROM/Strength/Tone: Deficits RLE ROM/Strength/Tone Deficits: unable to perform SLR, poor quad contraction, maintained KI Left Lower Extremity Assessment LLE ROM/Strength/Tone: WFL for tasks assessed   Balance    End of Session PT - End of Session Equipment Utilized During Treatment: Gait belt;Right knee immobilizer Activity Tolerance: Patient tolerated treatment well Patient left: in chair;with call bell/phone within reach;with family/visitor present Nurse Communication: Mobility status;Other (comment) (+2 back to bed for safety)  GP     Breeanne Oblinger,KATHrine E 11/18/2012, 4:55 PM  Zenovia Jarred, PT, DPT 11/18/2012 Pager: [phone]

## 2012-11-18 NOTE — Op Note (Signed)
[name], [name]                   ACCOUNT NO.:  [id number]  MEDICAL RECORD NO.:  [id number]  LOCATION:  [facility]                         FACILITY:  [facility]  PHYSICIAN:  Georges Lynch. Peytin Dechert, M.D.DATE OF BIRTH:  [birth year]  DATE OF PROCEDURE:  11/18/2012 DATE OF DISCHARGE:                              OPERATIVE REPORT   SURGEON:  Georges Lynch. Darrelyn Hillock, M.D.  ASSISTANT:  Dimitri Ped, [name].  PREOPERATIVE DIAGNOSIS: 1. Prepatellar bursa versus prepatellar mass. 2. Severe osteoarthritis with bone-on-bone and flexion contracture,     right knee.  POSTOPERATIVE DIAGNOSIS: 1. Prepatellar large lipoma. 2. Prepatellar bursa versus prepatellar mass. 3. Severe osteoarthritis with bone-on-bone and flexion contracture,     right knee.  OPERATIONS: 1. Excision of the prepatellar bursa that was sent to Pathology on the     right. 2. Right total knee arthroplasty utilizing DePuy system.  The sizes     used was a size 5 right posterior cruciate sacrificing femoral     component, cemented, #2 tibial tray was a size 5 cemented, #3     insert was a size 5-10 mm thickness rotating platform, #4 size 41.  DESCRIPTION OF THE PROCEDURE:  Under general anesthesia, routine orthopedic prep and draping of the right lower extremity was carried out.  Leg was exsanguinated and Esmarch tourniquet was elevated at 325 mmHg.  This was all done after appropriate time-out.  I also marked the appropriate right leg in the holding area.  At this time, the patient had 2 g of IV Ancef and 1 g of IV Tylenol.  A midline incision was made in the usual fashion over the anterior aspect of the right knee. Bleeders were identified and cauterized.  Two flaps were created.  I then carried out a median parapatellar incision and reflected the patella laterally and did medial lateral meniscectomies and then anterior and posterior cruciate resection and also did a synovectomy. At this time, I then made my initial drill hole in the  intercondylar notch.  The guide rod was inserted.  We thoroughly irrigated out the canal, and I removed 12-mm thickness off the distal femur in usual fashion on the right.  At this time, the femur was measured to be a size 5.  We then did our anterior, posterior, and chamfer cuts for a size 5 right femur.  Attention then was directed to the tibia.  We took the appropriate amount of bone from the tibia, tip of that we removed 6-mm thickness of the affected lateral side of the knee.  At this particular time, we then cleaned the knee out from all the soft tissue and bone materials, and then we inserted our lamina spreaders, removed small posterior spur from the lateral femoral condyle.  I then inserted my spacer guides.  We had excellent function with the spacer guides, excellent tension.  I then cut my keel cut of the tibia in the usual fashion.  I then cut my notch cut out of the femur.  Trial components were inserted.  The knee was held in extension.  I then did my resurfacing procedure on the patella and patellar patella was measured a size 41.  Three drill holes were made in the patella for a size 41 patella.  At this time, all trial components were removed.  I thoroughly water picked out the knee, dried the knee out, cemented all 3 components simultaneously.  After the cement was hardened, all loose pieces of cement were removed.  I water picked posterior aspect of the knee as well to make sure there were no other loose fragments.  At this time, I injected a mixture of 20 mL of Exparel and 20 mL of normal saline.  I used half of that mixture for the soft tissue injections.  The other half was used the end of the procedure.  We finally inserted our permanent tibial insert, a size 5, 10-mm thickness rotating platform, and the knee had excellent function.  We irrigated the knee out again and after this Hemovac drain was inserted, and I did then excise the lipoma over the prepatellar area.   It was quite thick.  It was over the prepatellar tendon.  I then closed the knee in layers in usual fashion. Sterile dressings were applied.          ______________________________ Georges Lynch Darrelyn Hillock, M.D.     RAG/MEDQ  D:  11/18/2012  T:  11/18/2012  Job:  [id number]

## 2012-11-18 NOTE — Anesthesia Preprocedure Evaluation (Addendum)
Anesthesia Evaluation  Patient identified by MRN, date of birth, ID band Patient awake    Reviewed: Allergy & Precautions, H&P , NPO status , Patient's Chart, lab work & pertinent test results  Airway Mallampati: II TM Distance: >3 FB Neck ROM: Full    Dental No notable dental hx. (+) Edentulous Upper   Pulmonary neg pulmonary ROS,  breath sounds clear to auscultation  Pulmonary exam normal       Cardiovascular hypertension, Pt. on medications negative cardio ROS  + dysrhythmias Atrial Fibrillation Rhythm:Regular Rate:Normal     Neuro/Psych negative neurological ROS  negative psych ROS   GI/Hepatic negative GI ROS, Neg liver ROS,   Endo/Other  negative endocrine ROSdiabetes, Oral Hypoglycemic Agents and Insulin Dependent  Renal/GU negative Renal ROS  negative genitourinary   Musculoskeletal negative musculoskeletal ROS (+)   Abdominal   Peds negative pediatric ROS (+)  Hematology negative hematology ROS (+)   Anesthesia Other Findings   Reproductive/Obstetrics negative OB ROS                          Anesthesia Physical Anesthesia Plan  ASA: II  Anesthesia Plan: General   Post-op Pain Management:    Induction: Intravenous  Airway Management Planned: Oral ETT  Additional Equipment:   Intra-op Plan:   Post-operative Plan: Extubation in OR  Informed Consent: I have reviewed the patients History and Physical, chart, labs and discussed the procedure including the risks, benefits and alternatives for the proposed anesthesia with the patient or authorized representative who has indicated his/her understanding and acceptance.   Dental advisory given  Plan Discussed with: CRNA  Anesthesia Plan Comments:         Anesthesia Quick Evaluation

## 2012-11-18 NOTE — Progress Notes (Signed)
Utilization review completed.  

## 2012-11-18 NOTE — Interval H&P Note (Signed)
History and Physical Interval Note:  11/18/2012 7:24 AM  [name]  has presented today for surgery, with the diagnosis of oa right knee bursitis   The various methods of treatment have been discussed with the patient and family. After consideration of risks, benefits and other options for treatment, the patient has consented to  Procedure(s) (LRB) with comments: TOTAL KNEE ARTHROPLASTY (Right) KNEE BURSECTOMY (Right) as a surgical intervention .  The patient's history has been reviewed, patient examined, no change in status, stable for surgery.  I have reviewed the patient's chart and labs.  Questions were answered to the patient's satisfaction.     [name]

## 2012-11-18 NOTE — Anesthesia Postprocedure Evaluation (Signed)
  Anesthesia Post-op Note  Patient: [name]  Procedure(s) Performed: Procedure(s) (LRB): TOTAL KNEE ARTHROPLASTY (Right) KNEE BURSECTOMY (Right)  Patient Location: PACU  Anesthesia Type: General  Level of Consciousness: awake and alert   Airway and Oxygen Therapy: Patient Spontanous Breathing  Post-op Pain: mild  Post-op Assessment: Post-op Vital signs reviewed, Patient's Cardiovascular Status Stable, Respiratory Function Stable, Patent Airway and No signs of Nausea or vomiting  Last Vitals:  Filed Vitals:   11/18/12 1200  BP: 147/63  Pulse: 75  Temp: 37 C  Resp: 16    Post-op Vital Signs: stable   Complications: No apparent anesthesia complications

## 2012-11-18 NOTE — Progress Notes (Signed)
Dr. Carignan paged re: CBG [phone] 

## 2012-11-18 NOTE — Transfer of Care (Signed)
Immediate Anesthesia Transfer of Care Note  Patient: [name]  Procedure(s) Performed: Procedure(s) (LRB) with comments: TOTAL KNEE ARTHROPLASTY (Right) KNEE BURSECTOMY (Right)  Patient Location: PACU  Anesthesia Type:General  Level of Consciousness: awake and alert   Airway & Oxygen Therapy: Patient Spontanous Breathing  Post-op Assessment: Report given to PACU RN and Post -op Vital signs reviewed and stable  Post vital signs: Reviewed and stable  Complications: No apparent anesthesia complications

## 2012-11-19 ENCOUNTER — Encounter (HOSPITAL_COMMUNITY): Payer: Self-pay | Admitting: Orthopedic Surgery

## 2012-11-19 LAB — BASIC METABOLIC PANEL
GFR calc Af Amer: 80 mL/min — ABNORMAL LOW (ref >90)
GFR calc non Af Amer: 69 mL/min — ABNORMAL LOW (ref >90)
Glucose, Bld: 243 mg/dL — ABNORMAL HIGH (ref 70–99)
Potassium: 4.4 mEq/L (ref 3.5–5.1)
Sodium: 135 mEq/L (ref 135–145)

## 2012-11-19 LAB — GLUCOSE, CAPILLARY
Glucose-Capillary: 233 mg/dL — ABNORMAL HIGH (ref 70–99)
Glucose-Capillary: 238 mg/dL — ABNORMAL HIGH (ref 70–99)

## 2012-11-19 LAB — CBC
Hemoglobin: 11.3 g/dL — ABNORMAL LOW (ref 13.0–17.0)
MCHC: 33.7 g/dL (ref 30.0–36.0)
RDW: 12.9 % (ref 11.5–15.5)

## 2012-11-19 MED ORDER — POLYETHYLENE GLYCOL 3350 17 G PO PACK: 17.0000 g | PACK | Freq: Every day | ORAL | Status: DC | PRN

## 2012-11-19 MED ORDER — INSULIN DETEMIR 100 UNIT/ML ~~LOC~~ SOLN
10.0000 [IU] | Freq: Every day | SUBCUTANEOUS | Status: DC
  Administered 2012-11-19 – 2012-11-20 (×2): 10 [IU] via SUBCUTANEOUS
  Filled 2012-11-19: qty 10

## 2012-11-19 MED ORDER — OXYCODONE-ACETAMINOPHEN 5-325 MG PO TABS: 1.0000 | ORAL_TABLET | ORAL | Status: DC | PRN

## 2012-11-19 MED ORDER — RIVAROXABAN 10 MG PO TABS: 10.0000 mg | ORAL_TABLET | Freq: Every day | ORAL | Status: DC

## 2012-11-19 MED ORDER — INSULIN DETEMIR 100 UNIT/ML ~~LOC~~ SOLN
40.0000 [IU] | Freq: Every day | SUBCUTANEOUS | Status: DC
  Administered 2012-11-20 – 2012-11-21 (×2): 40 [IU] via SUBCUTANEOUS
  Filled 2012-11-19: qty 10

## 2012-11-19 MED ORDER — METHOCARBAMOL 500 MG PO TABS: 500.0000 mg | ORAL_TABLET | Freq: Four times a day (QID) | ORAL | Status: DC | PRN

## 2012-11-19 MED FILL — Thrombin For Soln 5000 Unit: CUTANEOUS | Qty: 5000 | Status: AC

## 2012-11-19 NOTE — Progress Notes (Signed)
   Subjective: 1 Day Post-Op Procedure(s) (LRB): TOTAL KNEE ARTHROPLASTY (Right) KNEE BURSECTOMY (Right) Patient reports pain as mild.   Patient seen in rounds with Dr. Darrelyn Hillock. Patient is well, and has had no acute complaints or problems. He denies shortness of breath and chest pain. No issues overnight. He was eventually able to get a little rest last night. He reports that he spoke with the social worker yesterday regarding SNF placement.   Plan is to go Skilled nursing facility after hospital stay.  Objective: Vital signs in last 24 hours: Temp:  [97.7 F (36.5 C)-99.5 F (37.5 C)] 98.6 F (37 C) (01/30 0416) Pulse Rate:  [47-100] 74  (01/30 0416) Resp:  [11-19] 16  (01/30 0416) BP: (90-163)/(51-86) 160/75 mmHg (01/30 0416) SpO2:  [97 %-100 %] 99 % (01/30 0416) Weight:  [91.6 kg (201 lb 15.1 oz)] 91.6 kg (201 lb 15.1 oz) (01/29 1347)  Intake/Output from previous day:  Intake/Output Summary (Last 24 hours) at 11/19/12 0751 Last data filed at 11/19/12 0612  Gross per 24 hour  Intake   3800 ml  Output   2150 ml  Net   1650 ml    Intake/Output this shift:    Labs:  Basename 11/19/12 0425  HGB 11.3*    Basename 11/19/12 0425  WBC 9.6  RBC 3.59*  HCT 33.5*  PLT 181    Basename 11/19/12 0425  NA 135  K 4.4  CL 100  CO2 27  BUN 17  CREATININE 1.00  GLUCOSE 243*  CALCIUM 8.9   No results found for this basename: LABPT:2,INR:2 in the last 72 hours  EXAM General - Patient is Alert and Oriented Extremity - Neurologically intact Neurovascular intact Dorsiflexion/Plantar flexion intact Incision: dressing C/D/I Motor Function - intact, moving foot and toes well on exam.  Hemovac pulled without difficulty.  Past Medical History  Diagnosis Date  . Diabetes mellitus age 79  . Hyperlipidemia   . Hypertension   . Bleeding nose     occasional  . Change in hearing   . Joint pain   . Ulcer   . Carotid artery occlusion   . Dysrhythmia     hx. Atrial  Flutter x1, converted spontaneously-no ploblems.   Marland Kitchen HOH (hard of hearing) 11-10-12    bilaterally  . Breathing difficulty 11-10-12    difficult to breath if laying posteriorly, right nare difficulty  . H/O hiatal hernia   . Arthritis     arthritis -back knees, most joints  . Anemia     related to frequent nosebleeds-right nostril.    Assessment/Plan: 1 Day Post-Op Procedure(s) (LRB): TOTAL KNEE ARTHROPLASTY (Right) KNEE BURSECTOMY (Right) Active Problems:  Primary osteoarthritis of right knee  Estimated Body mass index is 26.76 kg/(m^2) as calculated from the following:   Height as of this encounter: 6' .835"[Documented 11/10/12[(1.85 m).   Weight as of this encounter: 201 lb 15.1 oz(91.6 kg). Advance diet Up with therapy Discharge to SNF tentative Saturday  DVT Prophylaxis - Xarelto Weight-Bearing as tolerated to right leg   Will start therapy today, WBAT. Social worker will continue to make arrangements for SNF placement. Will recheck labs in the morning.   Jaquitta Dupriest LAUREN 11/19/2012, 7:51 AM

## 2012-11-19 NOTE — Progress Notes (Signed)
Physical Therapy Treatment Patient Details Name: [name] MRN: [id number] DOB: August 18, 1930 Today's Date: 11/19/2012 Time: 1342-1400 PT Time Calculation (min): 18 min  PT Assessment / Plan / Recommendation Comments on Treatment Session  POD # 2 pm session.  Assisted pt out of recliner to amb in hallway 2nd time then assisted back to bed. Pt plans to D/C to SNF for ST Rehab.    Follow Up Recommendations  SNF     Does the patient have the potential to tolerate intense rehabilitation     Barriers to Discharge        Equipment Recommendations  None recommended by PT    Recommendations for Other Services    Frequency 7X/week   Plan Discharge plan remains appropriate    Precautions / Restrictions Precautions Precautions: Knee Precaution Comments: Instructed pt on KI use for amb Required Braces or Orthoses: Knee Immobilizer - Right Knee Immobilizer - Right: Discontinue once straight leg raise with < 10 degree lag Restrictions Weight Bearing Restrictions: No RLE Weight Bearing: Weight bearing as tolerated   Pertinent Vitals/Pain C/o 4/10 R knee pain during gait    Mobility  Bed Mobility Bed Mobility: Sit to Supine Supine to Sit: 4: Min assist Sitting - Scoot to Edge of Bed: 4: Min assist Sit to Supine: 4: Min assist Details for Bed Mobility Assistance: Min assist to support R LE up onto bed and increased time to position Transfers Transfers: Sit to Stand;Stand to Sit Sit to Stand: 4: Min assist;From chair/3-in-1 Stand to Sit: To bed Details for Transfer Assistance: 25% VC's on proper tech and hand placement and to extend R LE Ambulation/Gait Ambulation/Gait Assistance: 4: Min assist Ambulation Distance (Feet): 57 Feet Assistive device: Rolling walker Ambulation/Gait Assistance Details: 25% VC's on proper walker to self placement and to increase WB thru LE  Gait Pattern: Step-to pattern;Decreased stance time - right;Trunk flexed Gait velocity: decreased        PT Goals                                  progressing    Visit Information  Last PT Received On: 11/19/12 Assistance Needed: +1    Subjective Data  Subjective: I just asked about you Patient Stated Goal: SNF for Rehab   Cognition       Balance     End of Session PT - End of Session Equipment Utilized During Treatment: Gait belt;Right knee immobilizer Activity Tolerance: Patient limited by fatigue Patient left: in bed Nurse Communication: Mobility status   Felecia Shelling  PTA [facility]  Acute  Rehab Pager      [phone]

## 2012-11-19 NOTE — Progress Notes (Signed)
Clinical Social Work Department BRIEF PSYCHOSOCIAL ASSESSMENT 11/19/2012  Patient:  [name], [name]     Account Number:  [id number]     Admit date:  11/18/2012  Clinical Social Worker:  Candie Chroman  Date/Time:  11/19/2012 01:45 PM  Referred by:  Physician  Date Referred:  11/19/2012 Referred for  SNF Placement   Other Referral:   Interview type:  Patient Other interview type:    PSYCHOSOCIAL DATA Living Status:  WIFE Admitted from facility:   Level of care:   Primary support name:  [name] Primary support relationship to patient:  SPOUSE Degree of support available:   supportive    CURRENT CONCERNS Current Concerns  Post-Acute Placement   Other Concerns:    SOCIAL WORK ASSESSMENT / PLAN Pt is an 79 yr old gentleman living at home prior to hospitalization. CSW met with pt / spouse to assist with d/c planning. Pt has accepted a ST Rehab bed at [facility] following hospital d/c. CSW will contiue to follow to assist with d/c planning to SNF.   Assessment/plan status:  Psychosocial Support/Ongoing Assessment of Needs Other assessment/ plan:   Information/referral to community resources:   SNF list provided.    PATIENT'S/FAMILY'S RESPONSE TO PLAN OF CARE: Pt feels ST rehab wpould be helpful before returning home.   Cori Razor LCSW [phone]

## 2012-11-19 NOTE — Progress Notes (Signed)
Inpatient Diabetes Program Recommendations  AACE/ADA: New Consensus Statement on Inpatient Glycemic Control (2013)  Target Ranges:  Prepandial:   less than 140 mg/dL      Peak postprandial:   less than 180 mg/dL (1-2 hours)      Critically ill patients:  140 - 180 mg/dL   Reason for Visit: Hyperglycemia  Pt states his blood sugars have been recently high and hasn't seen PCP since October.  Takes Levemir 40 units in am and 10 units QHS and metformin 1000 mg bid.   Results for EIVIN, MASCIO (MRN [id number]) as of 11/19/2012 12:18  Ref. Range 11/19/2012 11:49  Glucose-Capillary Latest Range: 70-99 mg/dL 657 (H)  Results for JAVARIOUS, ELSAYED (MRN [id number]) as of 11/19/2012 12:18  Ref. Range 11/19/2012 04:25  Sodium Latest Range: 135-145 mEq/L 135  Potassium Latest Range: 3.5-5.1 mEq/L 4.4  Chloride Latest Range: 96-112 mEq/L 100  CO2 Latest Range: 19-32 mEq/L 27  BUN Latest Range: 6-23 mg/dL 17  Creatinine Latest Range: 0.50-1.35 mg/dL 8.41  Calcium Latest Range: 8.4-10.5 mg/dL 8.9  GFR calc non Af Amer Latest Range: >90 mL/min 69 (L)  GFR calc Af Amer Latest Range: >90 mL/min 80 (L)  Glucose Latest Range: 70-99 mg/dL 324 (H)   Needs basal insulin to control blood sugars.   Inpatient Diabetes Program Recommendations Insulin - Basal: Add Levemir 40 units QAM and 10 units QHS Correction (SSI): Add HS correction  Note: Discussed with RN - to call MD to request orders for Levemir.  Will continue to follow.  Thank you. Ailene Ards, RD, LDN, CDE Inpatient Diabetes Coordinator [phone]

## 2012-11-19 NOTE — Progress Notes (Signed)
Clinical Social Work Department CLINICAL SOCIAL WORK PLACEMENT NOTE 11/19/2012  Patient:  [name], [name]  Account Number:  [id number] Admit date:  11/18/2012  Clinical Social Worker:  Cori Razor, LCSW  Date/time:  11/19/2012 01:51 PM  Clinical Social Work is seeking post-discharge placement for this patient at the following level of care:   SKILLED NURSING   (*CSW will update this form in Epic as items are completed)   11/19/2012  Patient/family provided with [facility] Department of Clinical Social Work's list of facilities offering this level of care within the geographic area requested by the patient (or if unable, by the patient's family).  11/19/2012  Patient/family informed of their freedom to choose among providers that offer the needed level of care, that participate in Medicare, Medicaid or managed care program needed by the patient, have an available bed and are willing to accept the patient.    Patient/family informed of [facility]' ownership interest in [facility], as well as of the fact that they are under no obligation to receive care at this facility.  PASARR submitted to EDS on 11/18/2012 PASARR number received from EDS on 11/18/2012  FL2 transmitted to all facilities in geographic area requested by pt/family on  11/18/2012 FL2 transmitted to all facilities within larger geographic area on   Patient informed that his/her managed care company has contracts with or will negotiate with  certain facilities, including the following:     Patient/family informed of bed offers received:  11/19/2012 Patient chooses bed at [facility] Physician recommends and patient chooses bed at    Patient to be transferred to [facility] on   Patient to be transferred to facility by   The following physician request were entered in Epic:   Additional Comments:  Cori Razor LCSW [phone]

## 2012-11-19 NOTE — Progress Notes (Signed)
Physical Therapy Treatment Patient Details Name: [name] MRN: [id number] DOB: 1930/06/24 Today's Date: 11/19/2012 Time: 6213-0865 PT Time Calculation (min): 40 min  PT Assessment / Plan / Recommendation Comments on Treatment Session  POD # 1 am session R TKR.  Applied KI and instructed pt on use for amb.  Assisted pt OOB to amb in hallway, then performed TKR TE's followed by ICE.      Follow Up Recommendations  SNF     Does the patient have the potential to tolerate intense rehabilitation     Barriers to Discharge        Equipment Recommendations  None recommended by PT    Recommendations for Other Services    Frequency 7X/week   Plan Discharge plan remains appropriate    Precautions / Restrictions Precautions Precautions: Knee Precaution Comments: Instructed pt on KI use for amb Required Braces or Orthoses: Knee Immobilizer - Right Knee Immobilizer - Right: Discontinue once straight leg raise with < 10 degree lag Restrictions Weight Bearing Restrictions: No RLE Weight Bearing: Weight bearing as tolerated   Pertinent Vitals/Pain C/o 6/10 during TE's and 3/10 during gait ICE applied    Mobility  Bed Mobility Bed Mobility: Supine to Sit;Sitting - Scoot to Edge of Bed Supine to Sit: 4: Min assist Sitting - Scoot to [place] of Bed: 4: Min assist Details for Bed Mobility Assistance: Increased time and Min Assist to support R LE off bed Transfers Transfers: Sit to Stand;Stand to Sit Sit to Stand: 4: Min assist;From elevated surface;From bed Stand to Sit: 4: Min assist;To chair/3-in-1 Details for Transfer Assistance: 25% VC's on proper tech and hand placement Ambulation/Gait Ambulation/Gait Assistance: 4: Min assist Ambulation Distance (Feet): 57 Feet Assistive device: Rolling walker Ambulation/Gait Assistance Details: 25% VC's on proper walker to self placement and to increase WB thru LE Gait Pattern: Step-to pattern;Decreased stance time - right;Trunk flexed Gait  velocity: decreased    Exercises Total Joint Exercises Ankle Circles/Pumps: AROM;Both;10 reps;Supine Quad Sets: AROM;Both;10 reps;Supine Gluteal Sets: AROM;Both;10 reps;Supine Towel Squeeze: AROM;Both;10 reps;Supine Heel Slides: AAROM;Right;10 reps;Supine Hip ABduction/ADduction: AAROM;Right;10 reps;Supine Straight Leg Raises: AAROM;Right;10 reps;Supine   PT Goals                          progressing    Visit Information  Last PT Received On: 11/19/12 Assistance Needed: +1    Subjective Data      Cognition       Balance     End of Session PT - End of Session Equipment Utilized During Treatment: Gait belt;Right knee immobilizer Activity Tolerance: Patient limited by pain Patient left: in chair;with call bell/phone within reach   Felecia Shelling  PTA [facility]  Acute  Rehab Pager      [phone]

## 2012-11-20 LAB — CBC
MCH: 32.2 pg (ref 26.0–34.0)
MCV: 92.9 fL (ref 78.0–100.0)
Platelets: 167 10*3/uL (ref 150–400)
RDW: 13.1 % (ref 11.5–15.5)

## 2012-11-20 LAB — BASIC METABOLIC PANEL
Calcium: 9.1 mg/dL (ref 8.4–10.5)
Creatinine, Ser: 1.08 mg/dL (ref 0.50–1.35)
GFR calc Af Amer: 73 mL/min — ABNORMAL LOW (ref >90)

## 2012-11-20 LAB — GLUCOSE, CAPILLARY
Glucose-Capillary: 234 mg/dL — ABNORMAL HIGH (ref 70–99)
Glucose-Capillary: 197 mg/dL — ABNORMAL HIGH (ref 70–99)
Glucose-Capillary: 123 mg/dL — ABNORMAL HIGH (ref 70–99)

## 2012-11-20 NOTE — Discharge Summary (Signed)
Physician Discharge Summary   Patient ID: Tyr Franca MRN: [id number] DOB/AGE: 1930/01/23 79 y.o.  Admit date: 11/18/2012 Discharge date: 11/21/2012  Primary Diagnosis: Osteoarthritis, right knee   Admission Diagnoses:  Past Medical History  Diagnosis Date  . Diabetes mellitus age 30  . Hyperlipidemia   . Hypertension   . Bleeding nose     occasional  . Change in hearing   . Joint pain   . Ulcer   . Carotid artery occlusion   . Dysrhythmia     hx. Atrial Flutter x1, converted spontaneously-no ploblems.   Marland Kitchen HOH (hard of hearing) 11-10-12    bilaterally  . Breathing difficulty 11-10-12    difficult to breath if laying posteriorly, right nare difficulty  . H/O hiatal hernia   . Arthritis     arthritis -back knees, most joints  . Anemia     related to frequent nosebleeds-right nostril.   Discharge Diagnoses:   Active Problems:  Primary osteoarthritis of right knee S/P right total knee arthroplasty   Estimated Body mass index is 26.76 kg/(m^2) as calculated from the following:   Height as of this encounter: 6' .835"[Documented 11/10/12[(1.85 m).   Weight as of this encounter: 201 lb 15.1 oz(91.6 kg).  Classification of overweight in adults according to BMI (WHO, 1998)   Procedure:  Procedure(s) (LRB): TOTAL KNEE ARTHROPLASTY (Right) KNEE BURSECTOMY (Right)   Consults: None  HPI: Tiburcio Bash, 79 y.o. male, has a history of pain and functional disability in the right knee due to arthritis and has failed non-surgical conservative treatments for greater than 12 weeks to includeNSAID's and/or analgesics, corticosteriod injections, flexibility and strengthening excercises and activity modification. Onset of symptoms was gradual, starting 5 years ago with gradually worsening course since that time. The patient noted no past surgery on the right knee(s). Patient currently rates pain in the right knee(s) at 7 out of 10 with activity. Patient has night pain, worsening of pain with activity  and weight bearing, pain that interferes with activities of daily living, pain with passive range of motion, crepitus and joint swelling. Patient has evidence of subchondral sclerosis, periarticular osteophytes and joint space narrowing by imaging studies. There is no active infection  Laboratory Data: Admission on 11/18/2012  Component Date Value Range Status  . Glucose-Capillary 11/18/2012 242* 70 - 99 mg/dL Final  . Glucose-Capillary 11/18/2012 178* 70 - 99 mg/dL Final  . WBC 40/98/1191 9.6  4.0 - 10.5 K/uL Final  . RBC 11/19/2012 3.59* 4.22 - 5.81 MIL/uL Final  . Hemoglobin 11/19/2012 11.3* 13.0 - 17.0 g/dL Final  . HCT 47/82/9562 33.5* 39.0 - 52.0 % Final  . MCV 11/19/2012 93.3  78.0 - 100.0 fL Final  . MCH 11/19/2012 31.5  26.0 - 34.0 pg Final  . MCHC 11/19/2012 33.7  30.0 - 36.0 g/dL Final  . RDW 13/05/6577 12.9  11.5 - 15.5 % Final  . Platelets 11/19/2012 181  150 - 400 K/uL Final  . Sodium 11/19/2012 135  135 - 145 mEq/L Final  . Potassium 11/19/2012 4.4  3.5 - 5.1 mEq/L Final  . Chloride 11/19/2012 100  96 - 112 mEq/L Final  . CO2 11/19/2012 27  19 - 32 mEq/L Final  . Glucose, Bld 11/19/2012 243* 70 - 99 mg/dL Final  . BUN 46/96/2952 17  6 - 23 mg/dL Final  . Creatinine, Ser 11/19/2012 1.00  0.50 - 1.35 mg/dL Final  . Calcium 84/13/2440 8.9  8.4 - 10.5 mg/dL Final  . GFR calc non  Af Amer 11/19/2012 69* >90 mL/min Final  . GFR calc Af Amer 11/19/2012 80* >90 mL/min Final   Comment:                                 The eGFR has been calculated                          using the CKD EPI equation.                          This calculation has not been                          validated in all clinical                          situations.                          eGFR's persistently                          <90 mL/min signify                          possible Chronic Kidney Disease.  . Glucose-Capillary 11/18/2012 247* 70 - 99 mg/dL Final  . Glucose-Capillary 11/18/2012 301*  70 - 99 mg/dL Final  . Glucose-Capillary 11/19/2012 233* 70 - 99 mg/dL Final  . Glucose-Capillary 11/19/2012 283* 70 - 99 mg/dL Final  . Glucose-Capillary 11/19/2012 320* 70 - 99 mg/dL Final  . WBC 96/01/5408 9.6  4.0 - 10.5 K/uL Final  . RBC 11/20/2012 3.11* 4.22 - 5.81 MIL/uL Final  . Hemoglobin 11/20/2012 10.0* 13.0 - 17.0 g/dL Final  . HCT 81/19/1478 28.9* 39.0 - 52.0 % Final  . MCV 11/20/2012 92.9  78.0 - 100.0 fL Final  . MCH 11/20/2012 32.2  26.0 - 34.0 pg Final  . MCHC 11/20/2012 34.6  30.0 - 36.0 g/dL Final  . RDW 29/56/2130 13.1  11.5 - 15.5 % Final  . Platelets 11/20/2012 167  150 - 400 K/uL Final  . Sodium 11/20/2012 137  135 - 145 mEq/L Final  . Potassium 11/20/2012 3.8  3.5 - 5.1 mEq/L Final  . Chloride 11/20/2012 101  96 - 112 mEq/L Final  . CO2 11/20/2012 28  19 - 32 mEq/L Final  . Glucose, Bld 11/20/2012 257* 70 - 99 mg/dL Final  . BUN 86/57/8469 21  6 - 23 mg/dL Final  . Creatinine, Ser 11/20/2012 1.08  0.50 - 1.35 mg/dL Final  . Calcium 62/95/2841 9.1  8.4 - 10.5 mg/dL Final  . GFR calc non Af Amer 11/20/2012 63* >90 mL/min Final  . GFR calc Af Amer 11/20/2012 73* >90 mL/min Final   Comment:                                 The eGFR has been calculated                          using the CKD EPI equation.  This calculation has not been                          validated in all clinical                          situations.                          eGFR's persistently                          <90 mL/min signify                          possible Chronic Kidney Disease.  . Glucose-Capillary 11/19/2012 238* 70 - 99 mg/dL Final  . Glucose-Capillary 11/20/2012 198* 70 - 99 mg/dL Final  Hospital Outpatient Visit on 11/10/2012  Component Date Value Range Status  . MRSA, PCR 11/10/2012 NEGATIVE  NEGATIVE Final  . Staphylococcus aureus 11/10/2012 NEGATIVE  NEGATIVE Final   Comment:                                 The Xpert SA Assay (FDA                           approved for NASAL specimens                          in patients over 87 years of age),                          is one component of                          a comprehensive surveillance                          program.  Test performance has                          been validated by [employer] for patients greater                          than or equal to 36 year old.                          It is not intended                          to diagnose infection nor to                          guide or monitor treatment.  Marland Kitchen aPTT 11/10/2012 27  24 - 37 seconds Final  . Sodium 11/10/2012 140  135 - 145 mEq/L Final  . Potassium 11/10/2012 3.9  3.5 - 5.1 mEq/L Final  . Chloride 11/10/2012 104  96 - 112 mEq/L Final  . CO2 11/10/2012 27  19 - 32 mEq/L Final  . Glucose, Bld 11/10/2012 203* 70 - 99 mg/dL Final  . BUN 78/29/5621 17  6 - 23 mg/dL Final  . Creatinine, Ser 11/10/2012 0.78  0.50 - 1.35 mg/dL Final  . Calcium 30/86/5784 9.8  8.4 - 10.5 mg/dL Final  . Total Protein 11/10/2012 6.8  6.0 - 8.3 g/dL Final  . Albumin 69/62/9528 3.6  3.5 - 5.2 g/dL Final  . AST 41/32/4401 18  0 - 37 U/L Final  . ALT 11/10/2012 24  0 - 53 U/L Final  . Alkaline Phosphatase 11/10/2012 67  39 - 117 U/L Final  . Total Bilirubin 11/10/2012 0.4  0.3 - 1.2 mg/dL Final  . GFR calc non Af Amer 11/10/2012 83* >90 mL/min Final  . GFR calc Af Amer 11/10/2012 >90  >90 mL/min Final   Comment:                                 The eGFR has been calculated                          using the CKD EPI equation.                          This calculation has not been                          validated in all clinical                          situations.                          eGFR's persistently                          <90 mL/min signify                          possible Chronic Kidney Disease.  Marland Kitchen Prothrombin Time 11/10/2012 12.3  11.6 - 15.2 seconds Final  . INR 11/10/2012 0.92   0.00 - 1.49 Final  . ABO/RH(D) 11/10/2012 A POS   Final  . Antibody Screen 11/10/2012 NEG   Final  . Sample Expiration 11/10/2012 11/21/2012   Final  . Color, Urine 11/10/2012 YELLOW  YELLOW Final  . APPearance 11/10/2012 CLEAR  CLEAR Final  . Specific Gravity, Urine 11/10/2012 1.027  1.005 - 1.030 Final  . pH 11/10/2012 5.0  5.0 - 8.0 Final  . Glucose, UA 11/10/2012 >1000* NEGATIVE mg/dL Final  . Hgb urine dipstick 11/10/2012 NEGATIVE  NEGATIVE Final  . Bilirubin Urine 11/10/2012 NEGATIVE  NEGATIVE Final  . Ketones, ur 11/10/2012 NEGATIVE  NEGATIVE mg/dL Final  . Protein, ur 02/72/5366 30* NEGATIVE mg/dL Final  . Urobilinogen, UA 11/10/2012 0.2  0.0 - 1.0 mg/dL Final  . Nitrite 44/12/4740 NEGATIVE  NEGATIVE Final  . Leukocytes, UA 11/10/2012 NEGATIVE  NEGATIVE Final  . WBC 11/10/2012 8.8  4.0 - 10.5 K/uL Final  . RBC 11/10/2012 4.46  4.22 - 5.81 MIL/uL Final  . Hemoglobin 11/10/2012 13.9  13.0 - 17.0 g/dL Final  . HCT 59/56/3875 41.4  39.0 - 52.0 %  Final  . MCV 11/10/2012 92.8  78.0 - 100.0 fL Final  . MCH 11/10/2012 31.2  26.0 - 34.0 pg Final  . MCHC 11/10/2012 33.6  30.0 - 36.0 g/dL Final  . RDW 19/14/7829 12.6  11.5 - 15.5 % Final  . Platelets 11/10/2012 231  150 - 400 K/uL Final  . Bacteria, UA 11/10/2012 RARE  RARE Final  . Urine-Other 11/10/2012 MUCOUS PRESENT   Final  . ABO/RH(D) 11/10/2012 A POS   Final     X-Rays:Dg Chest 2 View  11/10/2012  *RADIOLOGY REPORT*  Clinical Data: Preoperative assessment for right total knee arthroplasty, former smoker, hypertension  CHEST - 2 VIEW  Comparison: 09/10/2007  Findings: Upper-normal size of cardiac silhouette. Atherosclerotic calcification aorta. Mediastinal contours and pulmonary vascularity normal. Slightly hyperaerated lungs with minimal chronic peribronchial thickening. No acute infiltrate, pleural effusion or pneumothorax. Bones appear mildly demineralized with old fracture lateral left fifth rib.  IMPRESSION: Chronic pulmonary  hyperaeration and minimal peribronchial thickening. No acute abnormalities.   Original Report Authenticated By: Ulyses Southward, M.D.    Dg Knee Right Port  11/18/2012  *RADIOLOGY REPORT*  Clinical Data: Total knee arthroplasty.  PORTABLE RIGHT KNEE - 1-2 VIEW  Comparison: None.  Findings: The tibial and femoral components are well seated.  No complicating features.  IMPRESSION: Well seated components of a total knee arthroplasty.   Original Report Authenticated By: Rudie Meyer, M.D.     EKG: Orders placed during the hospital encounter of 11/10/12  . EKG 12-LEAD  . EKG 12-LEAD     Hospital Course: Martyn Timme is a 79 y.o. who was admitted to [facility]. They were brought to the operating room on 11/18/2012 and underwent Procedure(s): TOTAL KNEE ARTHROPLASTY KNEE BURSECTOMY.  Patient tolerated the procedure well and was later transferred to the recovery room and then to the orthopaedic floor for postoperative care.  They were given PO and IV analgesics for pain control following their surgery.  They were given 24 hours of postoperative antibiotics of  Anti-infectives     Start     Dose/Rate Route Frequency Ordered Stop   11/18/12 1430   ceFAZolin (ANCEF) IVPB 1 g/50 mL premix        1 g 100 mL/hr over 30 Minutes Intravenous Every 6 hours 11/18/12 1339 11/18/12 2107   11/18/12 0902   polymyxin B 500,000 Units, bacitracin 50,000 Units in sodium chloride irrigation 0.9 % 500 mL irrigation  Status:  Discontinued          As needed 11/18/12 0902 11/18/12 0937   11/18/12 0600   ceFAZolin (ANCEF) IVPB 2 g/50 mL premix        2 g 100 mL/hr over 30 Minutes Intravenous On call to O.R. 11/17/12 1728 11/18/12 5621         and started on DVT prophylaxis in the form of Xarelto.   PT and OT were ordered for total joint protocol.  Discharge planning consulted to help with postop disposition and equipment needs.  Patient had a decent night on the evening of surgery, having difficulty sleeping but  started to get up OOB with therapy on day one. Hemovac drain was pulled without difficulty.  Continued to work with therapy into day two.  Dressing was changed on day two and the incision was clean and dry.  By day three, the patient had progressed with therapy and meeting their goals.  Incision was healing well.  Patient was seen in rounds and was ready to go  home.   Discharge Medications: Prior to Admission medications   Medication Sig Start Date End Date Taking? Authorizing Provider  acetaminophen (TYLENOL) 500 MG tablet Take 500 mg by mouth every 6 (six) hours as needed. Pain   Yes Historical Provider, MD  atorvastatin (LIPITOR) 80 MG tablet Take 40 mg by mouth every evening. Takes 1/2   Yes Historical Provider, MD  insulin detemir (LEVEMIR) 100 UNIT/ML injection Inject 10-40 Units into the skin 2 (two) times daily. Takes 40 units in the morning and 10 units at night   Yes Historical Provider, MD  losartan (COZAAR) 100 MG tablet Take 100 mg by mouth daily before breakfast.    Yes Historical Provider, MD  metFORMIN (GLUCOPHAGE) 1000 MG tablet Take 1,000 mg by mouth 2 (two) times daily with a meal.   Yes Historical Provider, MD  traZODone (DESYREL) 100 MG tablet Take 100 mg by mouth at bedtime.     Yes Historical Provider, MD  methocarbamol (ROBAXIN) 500 MG tablet Take 1 tablet (500 mg total) by mouth every 6 (six) hours as needed. 11/19/12   Lanissa Cashen Tamala Ser, PA  oxyCODONE-acetaminophen (PERCOCET/ROXICET) 5-325 MG per tablet Take 1-2 tablets by mouth every 4 (four) hours as needed (Q4-6 hours PRN). 11/19/12   Hadas Jessop Tamala Ser, PA  polyethylene glycol (MIRALAX / GLYCOLAX) packet Take 17 g by mouth daily as needed. 11/19/12   Arnola Crittendon Tamala Ser, PA  rivaroxaban (XARELTO) 10 MG TABS tablet Take 1 tablet (10 mg total) by mouth daily with breakfast. 11/19/12   Indea Dearman Tamala Ser, PA    Diet: Diabetic diet Activity:WBAT Follow-up:in 2 weeks Disposition - Home Discharged Condition:  good   Discharge Orders    Future Appointments: Provider: Department: Dept Phone: Center:   06/30/2013 9:00 AM [facility] [phone] [facility]   06/30/2013 10:20 AM Evern Bio, NP [facility] [phone] [facility]     Future Orders Please Complete By Expires   Diet Carb Modified      Call MD / Call 911      Comments:   If you experience chest pain or shortness of breath, CALL 911 and be transported to the hospital emergency room.  If you develope a fever above 101 F, pus (white drainage) or increased drainage or redness at the wound, or calf pain, call your surgeon's office.   Constipation Prevention      Comments:   Drink plenty of fluids.  Prune juice may be helpful.  You may use a stool softener, such as Colace (over the counter) 100 mg twice a day.  Use MiraLax (over the counter) for constipation as needed.   Increase activity slowly as tolerated      Discharge instructions      Comments:   Walk with your walker. Weight bearing as instructed. Home Health Agency will follow you at home for your therapy. Change your dressing daily. Shower only, no tub bath. Call if any temperatures greater than 101 or any wound complications: [phone] during the day and ask for Dr. Jeannetta Ellis nurse, Mackey Birchwood. Follow up in office in 2 weeks   Driving restrictions      Comments:   No driving   Change dressing      Comments:   Change dressing daily with sterile 4 x 4 inch gauze dressing       Medication List     As of 11/20/2012  7:52 AM    STOP taking these medications  fish oil-omega-3 fatty acids 1000 MG capsule      TAKE these medications         acetaminophen 500 MG tablet   Commonly known as: TYLENOL   Take 500 mg by mouth every 6 (six) hours as needed. Pain      atorvastatin 80 MG tablet   Commonly known as: LIPITOR   Take 40 mg by mouth every evening. Takes 1/2      insulin detemir 100 UNIT/ML  injection   Commonly known as: LEVEMIR   Inject 10-40 Units into the skin 2 (two) times daily. Takes 40 units in the morning and 10 units at night      losartan 100 MG tablet   Commonly known as: COZAAR   Take 100 mg by mouth daily before breakfast.      metFORMIN 1000 MG tablet   Commonly known as: GLUCOPHAGE   Take 1,000 mg by mouth 2 (two) times daily with a meal.      methocarbamol 500 MG tablet   Commonly known as: ROBAXIN   Take 1 tablet (500 mg total) by mouth every 6 (six) hours as needed.      oxyCODONE-acetaminophen 5-325 MG per tablet   Commonly known as: PERCOCET/ROXICET   Take 1-2 tablets by mouth every 4 (four) hours as needed (Q4-6 hours PRN).      polyethylene glycol packet   Commonly known as: MIRALAX / GLYCOLAX   Take 17 g by mouth daily as needed.      rivaroxaban 10 MG Tabs tablet   Commonly known as: XARELTO   Take 1 tablet (10 mg total) by mouth daily with breakfast.      traZODone 100 MG tablet   Commonly known as: DESYREL   Take 100 mg by mouth at bedtime.           Follow-up Information    Follow up with GIOFFRE,RONALD A, MD. In 14 days.   Contact information:   [address], [address] [place] [address] [place] [address] [phone]          Signed: Kerby Nora 11/20/2012, 7:52 AM

## 2012-11-20 NOTE — Progress Notes (Signed)
OT Cancellation Note  Patient Details Name: [name] MRN: [id number] DOB: 1930-09-19   Cancelled Treatment:    Reason Eval/Treat Not Completed:  (screen:  defer to snf)  [name] 11/20/2012, 10:20 AM Marica Otter, OTR/L [phone] 11/20/2012

## 2012-11-20 NOTE — Progress Notes (Signed)
Physical Therapy Treatment Patient Details Name: [name] MRN: [id number] DOB: [birth year] Today's Date: 11/20/2012 Time: 1478-2956 PT Time Calculation (min): 43 min  PT Assessment / Plan / Recommendation Comments on Treatment Session  POD#3 R TKR am session.  Assisted pt OOB to amb in hallway then perfrom TKR TE's followed by ICE. Pt plans to D/C to [facility] for [facility] Re[facility].    Follow Up Recommendations  SNF     Does the patient have the potential to tolerate intense rehabilitation     Barriers to Discharge        Equipment Recommendations  None recommended by PT    Recommendations for Other Services    Frequency 7X/week   Plan Discharge plan remains appropriate    Precautions / Restrictions Precautions Precautions: Knee Precaution Comments: Instructed pt on KI use for amb  Required Braces or Orthoses: Knee Immobilizer - Right Knee Immobilizer - Right: Discontinue once straight leg raise with < 10 degree lag Restrictions Weight Bearing Restrictions: No RLE Weight Bearing: Weight bearing as tolerated   Pertinent Vitals/Pain C/o 3/10 during TE's ICE applied    Mobility  Bed Mobility Bed Mobility: Supine to Sit Supine to Sit: 4: Min assist;4: Min guard Details for Bed Mobility Assistance: Min assist to support R LE up onto bed and increased time to position  Transfers Transfers: Sit to Stand;Stand to Sit Sit to Stand: 4: Min guard;4: Min assist;From bed Stand to Sit: 4: Min guard;4: Min assist;To chair/3-in-1 Details for Transfer Assistance: 25% VC's on proper tech and hand placement and to extend R LE  Ambulation/Gait Ambulation/Gait Assistance: 4: Min assist;4: Min Government social research officer (Feet): 84 Feet Assistive device: Rolling walker Ambulation/Gait Assistance Details: 25% VC's on proper walker to self placement and to increase WB thru LE  Gait Pattern: Step-to pattern;Decreased stance time - right Gait velocity: decreased    Exercises Total Joint  Exercises Ankle Circles/Pumps: AROM;Both;10 reps;Supine Quad Sets: AROM;Both;10 reps;Supine Gluteal Sets: AROM;10 reps;Both;Supine Towel Squeeze: AROM;Both;10 reps;Supine Short Arc Quad: AROM;Right;10 reps;Supine Heel Slides: AAROM;Right;10 reps;Supine Hip ABduction/ADduction: AROM;Right;10 reps;Supine Straight Leg Raises: AAROM;Right;10 reps;Supine    PT Goals                         progressing   Visit Information  Last PT Received On: 11/20/12 Assistance Needed: +1    Subjective Data  Subjective: I'm going to [facility] Patient Stated Goal: Rehab   Cognition       Balance     End of Session PT - End of Session Equipment Utilized During Treatment: Gait belt;Right knee immobilizer Activity Tolerance: Patient tolerated treatment well Patient left: in chair;with call bell/phone within reach;with family/visitor present;Other (comment) (ICE to R knee)   Felecia Shelling  PTA [facility]  Acute  Rehab Pager      [phone]

## 2012-11-20 NOTE — Progress Notes (Signed)
Physical Therapy Treatment Patient Details Name: [name] MRN: [id number] DOB: Mar 26, 1930 Today's Date: 11/20/2012 Time: 4098-1191 PT Time Calculation (min): 28 min  PT Assessment / Plan / Recommendation Comments on Treatment Session  POD # 3 pm session.  Assisted pt out of recliner to amb in hallway second time today then assisted back to bed to perform TE's.  Pt plans to D/C to SNF tomorrow for ST Rehab.    Follow Up Recommendations  SNF     Does the patient have the potential to tolerate intense rehabilitation     Barriers to Discharge        Equipment Recommendations  None recommended by PT    Recommendations for Other Services    Frequency 7X/week   Plan Discharge plan remains appropriate    Precautions / Restrictions Precautions Precautions: Knee Precaution Comments: Instructed pt on KI use for amb Required Braces or Orthoses: Knee Immobilizer - Right Knee Immobilizer - Right: Discontinue once straight leg raise with < 10 degree lag Restrictions Weight Bearing Restrictions: No RLE Weight Bearing: Weight bearing as tolerated   Pertinent Vitals/Pain C/o "swelling" ICE applied    Mobility  Bed Mobility Bed Mobility: Sit to Supine Supine to Sit: 4: Min assist;4: Min guard Sit to Supine: 4: Min assist Details for Bed Mobility Assistance: Min assist to support R LE up onto bed and increased time to position  Transfers Transfers: Sit to Stand;Stand to Sit Sit to Stand: 4: Min guard;From chair/3-in-1 Stand to Sit: 4: Min guard;To bed Details for Transfer Assistance: 25% VC's safety with turns  Ambulation/Gait Ambulation/Gait Assistance: 4: Min guard;4: Min assist Ambulation Distance (Feet): 96 Feet Assistive device: Rolling walker Ambulation/Gait Assistance Details: <25% VC's on proper walker to self placement and to increase WB thru LE  Gait Pattern: Step-to pattern Gait velocity: decreased    Exercises Total Joint Exercises Ankle Circles/Pumps: AROM;Both;10  reps;Supine Quad Sets: AROM;Both;10 reps;Supine Gluteal Sets: AROM;Both;10 reps;Supine Towel Squeeze: AROM;Both;10 reps;Supine Short Arc Quad: AROM;Right;10 reps;Supine Heel Slides: AAROM;Right;10 reps;Supine Hip ABduction/ADduction: AROM;Right;10 reps;Supine Straight Leg Raises: AAROM;Right;10 reps;Supine    PT Goals                                    progressing    Visit Information  Last PT Received On: 11/20/12 Assistance Needed: +1    Subjective Data  Subjective: I'm going to [facility] Patient Stated Goal: Rehab   Cognition       Balance     End of Session PT - End of Session Equipment Utilized During Treatment: Gait belt;Right knee immobilizer Activity Tolerance: Patient tolerated treatment well Patient left: in bed;with call bell/phone within reach;with family/visitor present (ICE to R knee)   Felecia Shelling  PTA [facility]  Acute  Rehab Pager      [phone]

## 2012-11-20 NOTE — Care Management Note (Signed)
    Page 1 of 1   11/20/2012     5:24:30 PM   CARE MANAGEMENT NOTE 11/20/2012  Patient:  Bibb,Angelo   Account Number:  [id number]  Date Initiated:  11/20/2012  Documentation initiated by:  Colleen Can  Subjective/Objective Assessment:   dx osteoarthritis rt knee; total knee replacemnt     Action/Plan:   Plans are for SNF placemnt   Anticipated DC Date:  11/21/2012   Anticipated DC Plan:  SKILLED NURSING FACILITY  In-house referral  Clinical Social Worker      DC Planning Services  CM consult      Choice offered to / List presented to:             Status of service:  Completed, signed off Medicare Important Message given?  NA - LOS <3 / Initial given by admissions (If response is "NO", the following Medicare IM given date fields will be blank) Date Medicare IM given:   Date Additional Medicare IM given:    Discharge Disposition:    Per UR Regulation:    If discussed at Long Length of Stay Meetings, dates discussed:    Comments:

## 2012-11-20 NOTE — Progress Notes (Signed)
   Subjective: 2 Days Post-Op Procedure(s) (LRB): TOTAL KNEE ARTHROPLASTY (Right) KNEE BURSECTOMY (Right) Patient reports pain as mild.   Patient seen in rounds without Dr. Darrelyn Hillock. Patient is well, and has had no acute complaints or problems. He reports that he slept much better last night than the previous night. He denies shortness of breath and chest pain. No issues overnight. He says that therapy went well yesterday.  Plan is to go Skilled nursing facility after hospital stay.  Objective: Vital signs in last 24 hours: Temp:  [97.4 F (36.3 C)-98.7 F (37.1 C)] 98.1 F (36.7 C) (01/31 0637) Pulse Rate:  [79-98] 79  (01/31 0637) Resp:  [16-18] 18  (01/31 0738) BP: (123-149)/(63-72) 132/72 mmHg (01/31 0637) SpO2:  [92 %-95 %] 95 % (01/31 0637)  Intake/Output from previous day:  Intake/Output Summary (Last 24 hours) at 11/20/12 0743 Last data filed at 11/20/12 0718  Gross per 24 hour  Intake   3120 ml  Output   1525 ml  Net   1595 ml    Intake/Output this shift: Total I/O In: -  Out: 350 [Urine:350]  Labs:  [facility] 11/20/12 0438 11/19/12 0425  HGB 10.0* 11.3*    Basename 11/20/12 0438 11/19/12 0425  WBC 9.6 9.6  RBC 3.11* 3.59*  HCT 28.9* 33.5*  PLT 167 181    Basename 11/20/12 0438 11/19/12 0425  NA 137 135  K 3.8 4.4  CL 101 100  CO2 28 27  BUN 21 17  CREATININE 1.08 1.00  GLUCOSE 257* 243*  CALCIUM 9.1 8.9    EXAM General - Patient is Alert and Oriented Extremity - Neurologically intact Neurovascular intact Intact pulses distally Dorsiflexion/Plantar flexion intact Dressing/Incision - clean, dry, no drainage Motor Function - intact, moving foot and toes well on exam.   Past Medical History  Diagnosis Date  . Diabetes mellitus age 63  . Hyperlipidemia   . Hypertension   . Bleeding nose     occasional  . Change in hearing   . Joint pain   . Ulcer   . Carotid artery occlusion   . Dysrhythmia     hx. Atrial Flutter x1, converted  spontaneously-no ploblems.   Marland Kitchen HOH (hard of hearing) 11-10-12    bilaterally  . Breathing difficulty 11-10-12    difficult to breath if laying posteriorly, right nare difficulty  . H/O hiatal hernia   . Arthritis     arthritis -back knees, most joints  . Anemia     related to frequent nosebleeds-right nostril.    Assessment/Plan: 2 Days Post-Op Procedure(s) (LRB): TOTAL KNEE ARTHROPLASTY (Right) KNEE BURSECTOMY (Right) Active Problems:  Primary osteoarthritis of right knee  Estimated Body mass index is 26.76 kg/(m^2) as calculated from the following:   Height as of this encounter: 6' .835"[Documented 11/10/12[(1.85 m).   Weight as of this encounter: 201 lb 15.1 oz(91.6 kg). Advance diet Up with therapy D/C IV fluids Plan for discharge tomorrow Discharge to SNF tomorrow  DVT Prophylaxis - Xarelto Weight-Bearing as tolerated to right leg  He is doing very well. We will plan to continue therapy today. Discharge to [facility] tomorrow. Recheck labs in the morning.   Rutger Salton LAUREN 11/20/2012, 7:43 AM

## 2012-11-20 NOTE — Progress Notes (Signed)
D/C Summary has been faxed to [facility] for possible d/c on SAT. Week end CSW will assist with d/c planning to SNF. Pt is in agreement with d/c plan.  Cori Razor LCSW [phone]

## 2012-11-21 LAB — CBC
Hemoglobin: 9.7 g/dL — ABNORMAL LOW (ref 13.0–17.0)
MCH: 32.3 pg (ref 26.0–34.0)
MCHC: 34.5 g/dL (ref 30.0–36.0)
Platelets: 194 10*3/uL (ref 150–400)
RDW: 13.1 % (ref 11.5–15.5)

## 2012-11-21 NOTE — Progress Notes (Signed)
Pt discharged to [facility] in [place]. Left unit on stretcher pushed by ambulance personnel accompanied by wife. Left in good condition. Vw, rn.

## 2012-11-21 NOTE — Progress Notes (Signed)
Subjective: 3 Days Post-Op Procedure(s) (LRB): TOTAL KNEE ARTHROPLASTY (Right) KNEE BURSECTOMY (Right) Patient reports pain as mild.    Objective: Vital signs in last 24 hours: Temp:  [98.1 F (36.7 C)-98.2 F (36.8 C)] 98.1 F (36.7 C) (02/01 0620) Pulse Rate:  [80-96] 80  (02/01 0755) Resp:  [16-18] 18  (02/01 0757) BP: (139-183)/(69-73) 139/70 mmHg (02/01 0755) SpO2:  [96 %-98 %] 96 % (02/01 0757)  Intake/Output from previous day: 01/31 0701 - 02/01 0700 In: 1118.3 [P.O.:840; I.V.:278.3] Out: 1100 [Urine:1100] Intake/Output this shift: Total I/O In: 240 [P.O.:240] Out: -    Basename 11/21/12 0420 11/20/12 0438 11/19/12 0425  HGB 9.7* 10.0* 11.3*    Basename 11/21/12 0420 11/20/12 0438  WBC 8.6 9.6  RBC 3.00* 3.11*  HCT 28.1* 28.9*  PLT 194 167    Basename 11/20/12 0438 11/19/12 0425  NA 137 135  K 3.8 4.4  CL 101 100  CO2 28 27  BUN 21 17  CREATININE 1.08 1.00  GLUCOSE 257* 243*  CALCIUM 9.1 8.9   No results found for this basename: LABPT:2,INR:2 in the last 72 hours  Incision: dressing C/D/I  Assessment/Plan: 3 Days Post-Op Procedure(s) (LRB): TOTAL KNEE ARTHROPLASTY (Right) KNEE BURSECTOMY (Right) Discharge to SNF  [name] A 11/21/2012, 8:56 AM

## 2012-11-21 NOTE — Progress Notes (Signed)
Physical Therapy Treatment Patient Details Name: [name] MRN: [id number] DOB: 1929-12-26 Today's Date: 11/21/2012 Time: 8657-8469 PT Time Calculation (min): 23 min  PT Assessment / Plan / Recommendation Comments on Treatment Session  All education completed. Pt planning to d/c to rehab today.     Follow Up Recommendations  SNF     Does the patient have the potential to tolerate intense rehabilitation     Barriers to Discharge        Equipment Recommendations  None recommended by PT    Recommendations for Other Services    Frequency 7X/week   Plan Discharge plan remains appropriate    Precautions / Restrictions Precautions Precautions: Knee Required Braces or Orthoses: Knee Immobilizer - Right Knee Immobilizer - Right: Discontinue once straight leg raise with < 10 degree lag Restrictions Weight Bearing Restrictions: No RLE Weight Bearing: Weight bearing as tolerated   Pertinent Vitals/Pain 5/10 R knee    Mobility  Bed Mobility Bed Mobility: Supine to Sit;Sit to Supine Supine to Sit: 4: Min assist Sit to Supine: 4: Min assist Details for Bed Mobility Assistance: Assist for R LE Transfers Transfers: Sit to Stand;Stand to Sit Sit to Stand: 4: Min assist;From bed Stand to Sit: 4: Min guard;To bed Details for Transfer Assistance: Assist to rise, stabilize. VCs safety, technique, hand placement Ambulation/Gait Ambulation/Gait Assistance: 4: Min guard Ambulation Distance (Feet): 115 Feet Assistive device: Rolling walker Ambulation/Gait Assistance Details: VC safety, sequence, posture.  Gait Pattern: Step-to pattern;Trunk flexed;Decreased stride length    Exercises Total Joint Exercises Ankle Circles/Pumps: AROM;Both;15 reps;Supine Quad Sets: AROM;Right;10 reps;Supine Short Arc Quad: AROM;Right;10 reps;Supine;AAROM Heel Slides: AAROM;Right;10 reps;Supine Hip ABduction/ADduction: AAROM;AROM;Right;10 reps;Supine Straight Leg Raises: AAROM;Right;10 reps;Supine   PT  Diagnosis:    PT Problem List:   PT Treatment Interventions:     PT Goals Acute Rehab PT Goals Pt will go Supine/Side to Sit: with supervision PT Goal: Supine/Side to Sit - Progress: Progressing toward goal Pt will go Sit to Supine/Side: with supervision PT Goal: Sit to Supine/Side - Progress: Progressing toward goal Pt will go Sit to Stand: with supervision PT Goal: Sit to Stand - Progress: Progressing toward goal Pt will go Stand to Sit: with supervision PT Goal: Stand to Sit - Progress: Progressing toward goal Pt will Ambulate: 51 - 150 feet;with supervision;with least restrictive assistive device PT Goal: Ambulate - Progress: Progressing toward goal Pt will Perform Home Exercise Program: with supervision, verbal cues required/provided PT Goal: Perform Home Exercise Program - Progress: Progressing toward goal  Visit Information  Last PT Received On: 11/21/12 Assistance Needed: +1    Subjective Data  Subjective: "I'm leaving today" Patient Stated Goal: rehab   Cognition  Overall Cognitive Status: Appears within functional limits for tasks assessed/performed Arousal/Alertness: Awake/alert Orientation Level: Appears intact for tasks assessed Behavior During Session: [facility] for tasks performed    Balance     End of Session PT - End of Session Equipment Utilized During Treatment: Gait belt;Right knee immobilizer Activity Tolerance: Patient tolerated treatment well Patient left: in bed;with call bell/phone within reach;with family/visitor present   GP     Rebeca Alert [facility] 11/21/2012, 12:17 PM [phone]

## 2012-11-21 NOTE — Progress Notes (Signed)
Report called and given to Lupita Leash, rn at [facility] in [place]. All questions answered accordingly. Pt's wife at bedside awaiting transfer. Vw, rn.

## 2012-11-21 NOTE — Progress Notes (Signed)
Pt to be d/c today to [facility].  Pt and family agreeable. Confirmed plans with facility.  Plan transfer via EMS.   Leron Croak, LCSWA [employer] [phone]

## 2012-11-22 NOTE — Progress Notes (Signed)
Clinical Social Work Department CLINICAL SOCIAL WORK PLACEMENT NOTE 11/22/2012  Patient:  [name], [name]  Account Number:  [id number] Admit date:  11/18/2012  Clinical Social Worker:  Cori Razor, LCSW  Date/time:  11/19/2012 01:51 PM  Clinical Social Work is seeking post-discharge placement for this patient at the following level of care:   SKILLED NURSING   (*CSW will update this form in Epic as items are completed)   11/19/2012  Patient/family provided with [facility] Department of Clinical Social Work's list of facilities offering this level of care within the geographic area requested by the patient (or if unable, by the patient's family).  11/19/2012  Patient/family informed of their freedom to choose among providers that offer the needed level of care, that participate in Medicare, Medicaid or managed care program needed by the patient, have an available bed and are willing to accept the patient.    Patient/family informed of [facility]' ownership interest in [facility], as well as of the fact that they are under no obligation to receive care at this facility.  PASARR submitted to EDS on 11/18/2012 PASARR number received from E[employer] on 11/18/2012  FL2 transmitted to all facilities in geographic area requested by pt/family on  11/18/2012 FL2 transmitted to all facilities within larger geographic area on   Patient informed that his/her managed care company has contracts with or will negotiate with  certain facilities, including the following:     Patient/family informed of bed offers received:  11/19/2012 Patient chooses bed at [facility] Physician recommends and patient chooses bed at    Patient to be transferred to [place] on  11/21/2012 Patient to be transferred to facility by EMS  The following physician request were entered in Epic:   Additional Comments:  Cori Razor LCSW [phone]

## 2013-02-11 ENCOUNTER — Encounter: Payer: Self-pay | Admitting: Cardiovascular Disease

## 2013-06-29 ENCOUNTER — Encounter: Payer: Self-pay | Admitting: Family

## 2013-06-30 ENCOUNTER — Other Ambulatory Visit (INDEPENDENT_AMBULATORY_CARE_PROVIDER_SITE_OTHER): Payer: Medicare Other | Admitting: *Deleted

## 2013-06-30 ENCOUNTER — Ambulatory Visit (INDEPENDENT_AMBULATORY_CARE_PROVIDER_SITE_OTHER): Payer: Medicare Other | Admitting: Family

## 2013-06-30 ENCOUNTER — Encounter: Payer: Self-pay | Admitting: Family

## 2013-06-30 DIAGNOSIS — I6529 Occlusion and stenosis of unspecified carotid artery: Secondary | ICD-10-CM

## 2013-06-30 DIAGNOSIS — Z48812 Encounter for surgical aftercare following surgery on the circulatory system: Secondary | ICD-10-CM

## 2013-06-30 NOTE — Progress Notes (Signed)
Established Carotid Patient  History of Present Illness  [name] is a 79 y.o. male who had right CEA 09/14/2007 by Dr. Arbie Cookey. He denies ever having stroke or TIA symptoms. He walks regularly but is limited by right knee pain, he had right total knee replacement early this year.   Patient has Negative history of TIA or stroke symptom.  The patient denies amaurosis fugax or monocular blindness.  The patient  denies facial drooping.  Pt. denies hemiplegia.  The patient denies receptive or expressive aphasia.  Pt. denies extremity weakness.  The patient's previous neurologic deficits are Unchanged.  Patient denies New Medical or Surgical History.  Pt Diabetic: Yes, states his last A1C was 9.5. Pt smoker: former smoker, quit 1978  Pt meds include: Statin : Yes Betablocker: No ASA: Yes Other anticoagulants/antiplatelets: none   Past Medical History  Diagnosis Date  . Diabetes mellitus age 5  . Hyperlipidemia   . Hypertension   . Bleeding nose     occasional  . Change in hearing   . Joint pain   . Ulcer   . Carotid artery occlusion   . Dysrhythmia     hx. Atrial Flutter x1, converted spontaneously-no ploblems.   Marland Kitchen HOH (hard of hearing) 11-10-12    bilaterally  . Breathing difficulty 11-10-12    difficult to breath if laying posteriorly, right nare difficulty  . H/O hiatal hernia   . Arthritis     arthritis -back knees, most joints  . Anemia     related to frequent nosebleeds-right nostril.    Social History History  Substance Use Topics  . Smoking status: Former Smoker    Types: Cigarettes    Quit date: 10/21/1976  . Smokeless tobacco: Never Used  . Alcohol Use: Yes     Comment: 1-2 alcoholic drinks daily    Family History Family History  Problem Relation Age of Onset  . Cancer Mother     Pancreatic  . Heart disease Mother   . Cancer Brother     pancreactic  . Cancer Daughter     Breast    Surgical History Past Surgical History  Procedure Laterality  Date  . Carotid endarterectomy  11/242008    right with Dacron patch angioplasty  . Cataract extraction  1998 bilateral  done 3 months apart  . Total knee arthroplasty  11/18/2012    Procedure: TOTAL KNEE ARTHROPLASTY;  Surgeon: Jacki Cones, MD;  Location: [facility];  Service: Orthopedics;  Laterality: Right;  . Knee bursectomy  11/18/2012    Procedure: KNEE BURSECTOMY;  Surgeon: Jacki Cones, MD;  Location: [facility];  Service: Orthopedics;  Laterality: Right;  . Joint replacement Right 11-18-12  . Eye surgery      No Known Allergies  Current Outpatient Prescriptions  Medication Sig Dispense Refill  . acetaminophen (TYLENOL) 500 MG tablet Take 500 mg by mouth every 6 (six) hours as needed. Pain      . atorvastatin (LIPITOR) 80 MG tablet Take 40 mg by mouth every evening. Takes 1/2      . diltiazem (DILACOR XR) 180 MG 24 hr capsule daily.      Marland Kitchen gabapentin (NEURONTIN) 100 MG capsule daily.      . insulin detemir (LEVEMIR) 100 UNIT/ML injection Inject 10-30 Units into the skin 2 (two) times daily. Takes 40 units in the morning and 10 units at night      . losartan (COZAAR) 100 MG tablet Take 100 mg by mouth daily before  breakfast.       . metFORMIN (GLUCOPHAGE) 1000 MG tablet Take 1,000 mg by mouth 2 (two) times daily with a meal.      . traZODone (DESYREL) 100 MG tablet Take 100 mg by mouth at bedtime.        . methocarbamol (ROBAXIN) 500 MG tablet Take 1 tablet (500 mg total) by mouth every 6 (six) hours as needed.  40 tablet  1  . oxyCODONE-acetaminophen (PERCOCET/ROXICET) 5-325 MG per tablet Take 1-2 tablets by mouth every 4 (four) hours as needed (Q4-6 hours PRN).  80 tablet  0  . polyethylene glycol (MIRALAX / GLYCOLAX) packet Take 17 g by mouth daily as needed.  14 each  0  . rivaroxaban (XARELTO) 10 MG TABS tablet Take 1 tablet (10 mg total) by mouth daily with breakfast.  12 tablet  18   No current facility-administered medications for this visit.    Review of Systems : [x]   Positive   [ ]  Denies  General:[ ]  Weight loss,  [ ]  Weight gain, [ ]  Loss of appetite, [ ]  Fever, [ ]  chills  Neurologic: [ ]  Dizziness, [ ]  Blackouts, [ ]  Headaches, [ ]  Seizure [ ]  Stroke, [ ]  "Mini stroke", [ ]  Slurred speech, [ ]  Temporary blindness;  [ ] weakness,  Ear/Nose/Throat: [ ]  Change in hearing, [ ]  Nose bleeds, [ ]  Hoarseness  Vascular:[ ]  Pain in legs with walking, [ ]  Pain in feet while lying flat , [ ]   Non-healing ulcer, [ ]  Blood clot in vein,    Pulmonary: [ ]  Home oxygen, [ ]   Productive cough, [ ]  Bronchitis, [ ]  Coughing up blood,  [ ]  Asthma, [ ]  Wheezing  Musculoskeletal:  Arly.Keller ] Arthritis, Arly.Keller ] Joint pain, Arly.Keller ] low back pain since injury at age 84.  Cardiac: [ ]  Chest pain, [ ]  Shortness of breath when lying flat, [ ]  Shortness of breath with exertion, [ ]  Palpitations, [ ]  Heart murmur, [ ]   Atrial fibrillation  Hematologic:[X ] Easy Bruising, [ ]  Anemia; [ ]  Hepatitis  Psychiatric: [ ]   Depression, [ ]  Anxiety   Gastrointestinal: [ ]  Black stool, [ ]  Blood in stool, [ ]  Peptic ulcer disease,  [ ]  Gastroesophageal Reflux, [ ]  Trouble swallowing, [ ]  Diarrhea, [ ]  Constipation  Urinary: [ ]  chronic Kidney disease, [ ]  on HD, [ ]  Burning with urination, [ ]  Frequent urination, [ ]  Difficulty urinating;   Skin: [ ]  Rashes, [ ]  Wounds    Physical Examination  Filed Vitals:   06/30/13 1030  BP: 136/65  Pulse: 60  Resp:    Filed Weights   06/30/13 1026  Weight: 208 lb (94.348 kg)   Body mass index is 27.45 kg/(m^2).   General: WDWN male in NAD GAIT: slow and deliberate, steady, no assistive device. Eyes: PERRLA Pulmonary:  CTAB, Negative  Rales, Negative rhonchi, & Negative wheezing.  Cardiac: regular Rhythm ,  Negative Murmurs. Positive for 1+ right pretibial pitting edema.  VASCULAR EXAM Carotid Bruits Left Right   Negative Negative  LE Pulses LEFT RIGHT       FEMORAL   palpable   palpable        POPLITEAL  not palpable   not palpable       POSTERIOR TIBIAL   palpable    palpable        DORSALIS PEDIS      ANTERIOR TIBIAL  palpable   palpable       Gastrointestinal: soft, nontender, BS WNL, no r/g,  negative masses.  Musculoskeletal: Negative muscle atrophy/wasting. M/S 5/5 in UE's, 3/5 in legs, Extremities without ischemic changes.  Neurologic: A&O X 3; Appropriate Affect ; SENSATION ;normal; Speech is normal, positive for hard of hearing, CN 2-12 intact, Pain and light touch intact in extremities, Motor exam as listed above.   Non-Invasive Vascular Imaging CAROTID DUPLEX 06/30/2013   Right ICA: Widely patent CEA site without evidence of hyperplasia or restenosis. Left ICA <40% stenosis.  These findings are Unchanged from previous exam performed on 06/30/2012.   Assessment: Jeorge Reister is a 79 y.o. male who presents with asymptomatic widely patent right ICA carotid endarterectomy site and less than 40% stenosis in left ICA. The  ICA stenosis is  Unchanged from previous exam.  Plan: Follow-up in 1 year with Carotid Duplex scan.   I discussed in depth with the patient the nature of atherosclerosis, and emphasized the importance of maximal medical management including strict control of blood pressure, blood glucose, and lipid levels, obtaining regular exercise, and continued cessation of smoking.  The patient is aware that without maximal medical management the underlying atherosclerotic disease process will progress, limiting the benefit of any interventions. Pt was given information regarding stroke symptoms and prevention. Thank you for allowing [place] to participate in this patient's care.  Charisse March, RN, MSN, FNP-C [facility] Office: [phone]  Clinic Physician: Edilia Bo  06/30/2013 10:57 AM

## 2013-06-30 NOTE — Patient Instructions (Signed)
Stroke Prevention Some medical conditions and behaviors are associated with an increased chance of having a stroke. You may prevent a stroke by making healthy choices and managing medical conditions. Reduce your risk of having a stroke by:  Staying physically active. Get at least 30 minutes of activity on most or all days.  Not smoking. It may also be helpful to avoid exposure to secondhand smoke.  Limiting alcohol use. Moderate alcohol use is considered to be:  No more than 2 drinks per day for men.  No more than 1 drink per day for nonpregnant women.  Eating healthy foods.  Include 5 or more servings of fruits and vegetables a day.  Certain diets may be prescribed to address high blood pressure, high cholesterol, diabetes, or obesity.  Managing your cholesterol levels.  A low-saturated fat, low-trans fat, low-cholesterol, and high-fiber diet may control cholesterol levels.  Take any prescribed medicines to control cholesterol as directed by your caregiver.  Managing your diabetes.  A controlled-carbohydrate, controlled-sugar diet is recommended to manage diabetes.  Take any prescribed medicines to control diabetes as directed by your caregiver.  Controlling your high blood pressure (hypertension).  A low-salt (sodium), low-saturated fat, low-trans fat, and low-cholesterol diet is recommended to manage high blood pressure.  Take any prescribed medicines to control hypertension as directed by your caregiver.  Maintaining a healthy weight.  A reduced-calorie, low-sodium, low-saturated fat, low-trans fat, low-cholesterol diet is recommended to manage weight.  Stopping drug abuse.  Avoiding birth control pills.  Talk to your caregiver about the risks of taking birth control pills if you are over 35 years old, smoke, get migraines, or have ever had a blood clot.  Getting evaluated for sleep disorders (sleep apnea).  Talk to your caregiver about getting a sleep evaluation  if you snore a lot or have excessive sleepiness.  Taking medicines as directed by your caregiver.  For some people, aspirin or blood thinners (anticoagulants) are helpful in reducing the risk of forming abnormal blood clots that can lead to stroke. If you have the irregular heart rhythm of atrial fibrillation, you should be on a blood thinner unless there is a good reason you cannot take them.  Understand all your medicine instructions. SEEK IMMEDIATE MEDICAL CARE IF:   You have sudden weakness or numbness of the face, arm, or leg, especially on one side of the body.  You have sudden confusion.  You have trouble speaking (aphasia) or understanding.  You have sudden trouble seeing in one or both eyes.  You have sudden trouble walking.  You have dizziness.  You have a loss of balance or coordination.  You have a sudden, severe headache with no known cause.  You have new chest pain or an irregular heartbeat. Any of these symptoms may represent a serious problem that is an emergency. Do not wait to see if the symptoms will go away. Get medical help right away. Call your local emergency services (911 in [place] Do not drive yourself to the hospital. Document Released: 11/14/2004 Document Revised: 12/30/2011 Document Reviewed: 05/27/2011 [employer] Patient Information 2014 [employer].  

## 2013-09-07 DIAGNOSIS — G629 Polyneuropathy, unspecified: Secondary | ICD-10-CM | POA: Insufficient documentation

## 2013-09-07 DIAGNOSIS — R609 Edema, unspecified: Secondary | ICD-10-CM | POA: Insufficient documentation

## 2013-09-27 ENCOUNTER — Other Ambulatory Visit: Payer: Self-pay | Admitting: Dermatology

## 2013-10-08 ENCOUNTER — Ambulatory Visit: Payer: Medicare Other | Admitting: Cardiovascular Disease

## 2013-10-22 ENCOUNTER — Other Ambulatory Visit: Payer: Self-pay | Admitting: Gastroenterology

## 2013-10-22 DIAGNOSIS — R1310 Dysphagia, unspecified: Secondary | ICD-10-CM

## 2013-10-25 ENCOUNTER — Ambulatory Visit
Admission: RE | Admit: 2013-10-25 | Discharge: 2013-10-25 | Disposition: A | Discharge status: Home / Self Care | Payer: Medicare Other | Source: Ambulatory Visit | Attending: Gastroenterology | Admitting: Gastroenterology

## 2013-10-25 DIAGNOSIS — R1310 Dysphagia, unspecified: Secondary | ICD-10-CM

## 2013-11-05 ENCOUNTER — Encounter: Payer: Self-pay | Admitting: Cardiovascular Disease

## 2013-11-05 ENCOUNTER — Ambulatory Visit (INDEPENDENT_AMBULATORY_CARE_PROVIDER_SITE_OTHER): Payer: Medicare Other | Admitting: Cardiovascular Disease

## 2013-11-05 VITALS — BP 140/72 | HR 84 | Ht 73.0 in | Wt 205.0 lb

## 2013-11-05 DIAGNOSIS — I6529 Occlusion and stenosis of unspecified carotid artery: Secondary | ICD-10-CM

## 2013-11-05 DIAGNOSIS — I10 Essential (primary) hypertension: Secondary | ICD-10-CM

## 2013-11-05 DIAGNOSIS — E119 Type 2 diabetes mellitus without complications: Secondary | ICD-10-CM

## 2013-11-05 DIAGNOSIS — E785 Hyperlipidemia, unspecified: Secondary | ICD-10-CM

## 2013-11-05 NOTE — Assessment & Plan Note (Signed)
On statin therapy followed by his PCP 

## 2013-11-05 NOTE — Progress Notes (Signed)
11/05/2013 [name]   06-02-1930  [id number]  Primary Physician Florina Ou, MD Primary Cardiologist: [name] MD Renae Gloss   HPI:   The patient is an 80 year old, mildly overweight, married Caucasian male, father of 66, grandfather to 2 grandchildren who is accompanied by his wife today who is also a patient of mine. He was referred by Dr. Gladstone Lighter for cardiovascular clearance prior to elective total knee replacement.   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   His cardiovascular risk factor profile is remarkable for remote tobacco abuse, treated hypertension, diabetes and hyperlipidemia. There is no family history. He did have an elective right carotid endarterectomy 5 years ago performed by Dr. Sherren Mocha Early who follows his carotid Dopplers. He is otherwise asymptomatic. He did go to the [facility] and had an EKG that showed atrial flutter with variable block on August 18, 2012. He was completely unaware of this. His lipid profile followed by his PCP. Since I saw him a year ago he denies chest pain or shortness of breath.   Current Outpatient Prescriptions  Medication Sig Dispense Refill  . acetaminophen (TYLENOL) 500 MG tablet Take 500 mg by mouth every 6 (six) hours as needed. Pain      . atorvastatin (LIPITOR) 80 MG tablet Take 40 mg by mouth every evening. Takes 1/2      . Calcium-Vitamin D (CALTRATE 600 PLUS-VIT D PO) Take by mouth daily.      . Cholecalciferol (VITAMIN D3) 2000 UNITS TABS Take by mouth daily.      Marland Kitchen diltiazem (CARDIZEM CD) 120 MG 24 hr capsule Take 120 mg by mouth daily.      . hydrochlorothiazide (HYDRODIURIL) 25 MG tablet Take 12.5 mg by mouth daily.      . insulin detemir (LEVEMIR) 100 UNIT/ML injection Inject 10-30 Units into the skin 2 (two) times daily. Takes 40 units in the morning and 10 units at night      . losartan (COZAAR) 100 MG tablet Take 100 mg by mouth daily before  breakfast.       . metFORMIN (GLUCOPHAGE) 1000 MG tablet Take 1,000 mg by mouth 2 (two) times daily with a meal.      . traZODone (DESYREL) 100 MG tablet Take 100 mg by mouth at bedtime.         No current facility-administered medications for this visit.    No Known Allergies  History   Social History  . Marital Status: Married    Spouse Name: N/A    Number of Children: N/A  . Years of Education: N/A   Occupational History  . Not on file.   Social History Main Topics  . Smoking status: Former Smoker    Types: Cigarettes    Quit date: 10/21/1976  . Smokeless tobacco: Never Used  . Alcohol Use: Yes     Comment: 1-2 alcoholic drinks daily  . Drug Use: No  . Sexual Activity: Not on file   Other Topics Concern  . Not on file   Social History Narrative  . No narrative on file     Review of Systems: General: negative for chills, fever, night sweats or weight changes.  Cardiovascular: negative for chest pain, dyspnea on exertion, edema, orthopnea, palpitations, paroxysmal nocturnal dyspnea or shortness of breath Dermatological: negative for rash Respiratory: negative for cough or wheezing Urologic: negative for hematuria Abdominal: negative for nausea, vomiting, diarrhea, bright red blood  per rectum, melena, or hematemesis Neurologic: negative for visual changes, syncope, or dizziness All other systems reviewed and are otherwise negative except as noted above.    Blood pressure 140/72, pulse 84, height 6\' 1"  (1.854 m), weight 205 lb (92.987 kg).  General appearance: alert and no distress Neck: no adenopathy, no carotid bruit, no JVD, supple, symmetrical, trachea midline and thyroid not enlarged, symmetric, no tenderness/mass/nodules Lungs: clear to auscultation bilaterally Heart: regular rate and rhythm, S1, S2 normal, no murmur, click, rub or gallop Extremities: extremities normal, atraumatic, no cyanosis or edema  EKG normal sinus rhythm 84 without ST or T wave  changes  ASSESSMENT AND PLAN:   Occlusion and stenosis of carotid artery without mention of cerebral infarction Status post elective right carotid endarterectomy performed approximately 6-7 years ago by Dr. Sherren Mocha Early. He follows duplex ultrasound on an annual basis. He is neurologically asymptomatic.  Essential hypertension Controlled on current medications  Hyperlipidemia On statin therapy followed by his PCP      [name] MD [facility], [facility] 11/05/2013 12:49 PM

## 2013-11-05 NOTE — Assessment & Plan Note (Signed)
Status post elective right carotid endarterectomy performed approximately 6-7 years ago by Dr. Sherren Mocha Early. He follows duplex ultrasound on an annual basis. He is neurologically asymptomatic.

## 2013-11-05 NOTE — Patient Instructions (Signed)
Dr Berry wants you to follow-up in 1 year . You will receive a reminder letter in the mail two months in advance. If you don't receive a letter, please call our office to schedule the follow-up appointment. 

## 2013-11-05 NOTE — Assessment & Plan Note (Signed)
Controlled on current medications 

## 2013-11-08 ENCOUNTER — Encounter: Payer: Self-pay | Admitting: Cardiovascular Disease

## 2013-12-13 ENCOUNTER — Other Ambulatory Visit: Payer: Self-pay | Admitting: Family

## 2013-12-13 DIAGNOSIS — Z48812 Encounter for surgical aftercare following surgery on the circulatory system: Secondary | ICD-10-CM

## 2013-12-13 DIAGNOSIS — I6529 Occlusion and stenosis of unspecified carotid artery: Secondary | ICD-10-CM

## 2014-01-05 DIAGNOSIS — M1712 Unilateral primary osteoarthritis, left knee: Secondary | ICD-10-CM | POA: Insufficient documentation

## 2014-01-21 DIAGNOSIS — Z8679 Personal history of other diseases of the circulatory system: Secondary | ICD-10-CM | POA: Insufficient documentation

## 2014-02-14 DIAGNOSIS — J302 Other seasonal allergic rhinitis: Secondary | ICD-10-CM | POA: Insufficient documentation

## 2014-07-04 ENCOUNTER — Other Ambulatory Visit (HOSPITAL_COMMUNITY): Payer: Medicare Other

## 2014-07-04 ENCOUNTER — Ambulatory Visit: Payer: Medicare Other | Admitting: Family

## 2014-07-07 ENCOUNTER — Encounter: Payer: Self-pay | Admitting: Family

## 2014-07-08 ENCOUNTER — Ambulatory Visit (HOSPITAL_COMMUNITY)
Admission: RE | Admit: 2014-07-08 | Discharge: 2014-07-08 | Disposition: A | Discharge status: Home / Self Care | Payer: Medicare Other | Source: Ambulatory Visit | Attending: Family | Admitting: Family

## 2014-07-08 ENCOUNTER — Encounter: Payer: Medicare Other | Admitting: Family

## 2014-07-08 DIAGNOSIS — I6529 Occlusion and stenosis of unspecified carotid artery: Secondary | ICD-10-CM

## 2014-07-08 DIAGNOSIS — Z9889 Other specified postprocedural states: Secondary | ICD-10-CM | POA: Diagnosis not present

## 2014-07-08 DIAGNOSIS — Z48812 Encounter for surgical aftercare following surgery on the circulatory system: Secondary | ICD-10-CM | POA: Diagnosis present

## 2014-07-08 NOTE — Patient Instructions (Signed)
Dear Mr.  Butson,  Your recent visit Sept. 18, 2015   With [facility]  LAB. Results indicate: NO   Significant change in  Comparison to the last exam on Sept. 10, 2014. Please follow up with [place] in one year.         Stroke Prevention Some medical conditions and behaviors are associated with an increased chance of having a stroke. You may prevent a stroke by making healthy choices and managing medical conditions. HOW CAN I REDUCE MY RISK OF HAVING A STROKE?   Stay physically active. Get at least 30 minutes of activity on most or all days.  Do not smoke. It may also be helpful to avoid exposure to secondhand smoke.  Limit alcohol use. Moderate alcohol use is considered to be:  No more than 2 drinks per day for men.  No more than 1 drink per day for nonpregnant women.  Eat healthy foods. This involves:  Eating 5 or more servings of fruits and vegetables a day.  Making dietary changes that address high blood pressure (hypertension), high cholesterol, diabetes, or obesity.  Manage your cholesterol levels.  Making food choices that are high in fiber and low in saturated fat, trans fat, and cholesterol may control cholesterol levels.  Take any prescribed medicines to control cholesterol as directed by your health care provider.  Manage your diabetes.  Controlling your carbohydrate and sugar intake is recommended to manage diabetes.  Take any prescribed medicines to control diabetes as directed by your health care provider.  Control your hypertension.  Making food choices that are low in salt (sodium), saturated fat, trans fat, and cholesterol is recommended to manage hypertension.  Take any prescribed medicines to control hypertension as directed by your health care provider.  Maintain a healthy weight.  Reducing calorie intake and making food choices that are low in sodium, saturated fat, trans fat, and cholesterol are recommended to manage weight.  Stop drug abuse.  Avoid  taking birth control pills.  Talk to your health care provider about the risks of taking birth control pills if you are over 36 years old, smoke, get migraines, or have ever had a blood clot.  Get evaluated for sleep disorders (sleep apnea).  Talk to your health care provider about getting a sleep evaluation if you snore a lot or have excessive sleepiness.  Take medicines only as directed by your health care provider.  For some people, aspirin or blood thinners (anticoagulants) are helpful in reducing the risk of forming abnormal blood clots that can lead to stroke. If you have the irregular heart rhythm of atrial fibrillation, you should be on a blood thinner unless there is a good reason you cannot take them.  Understand all your medicine instructions.  Make sure that other conditions (such as anemia or atherosclerosis) are addressed. SEEK IMMEDIATE MEDICAL CARE IF:   You have sudden weakness or numbness of the face, arm, or leg, especially on one side of the body.  Your face or eyelid droops to one side.  You have sudden confusion.  You have trouble speaking (aphasia) or understanding.  You have sudden trouble seeing in one or both eyes.  You have sudden trouble walking.  You have dizziness.  You have a loss of balance or coordination.  You have a sudden, severe headache with no known cause.  You have new chest pain or an irregular heartbeat. Any of these symptoms may represent a serious problem that is an emergency. Do not  wait to see if the symptoms will go away. Get medical help at once. Call your local emergency services (911 in [place] Do not drive yourself to the hospital. Document Released: 11/14/2004 Document Revised: 02/21/2014 Document Reviewed: 04/09/2013 [facility] Patient Information 2015 [place], [place]. This information is not intended to replace advice given to you by your health care provider. Make sure you discuss any questions you have with your health care  provider.

## 2014-07-11 NOTE — Progress Notes (Signed)
Lab only 

## 2014-07-12 ENCOUNTER — Encounter: Payer: Self-pay | Admitting: Vascular Surgery

## 2014-07-21 DIAGNOSIS — H40013 Open angle with borderline findings, low risk, bilateral: Secondary | ICD-10-CM | POA: Insufficient documentation

## 2014-09-28 ENCOUNTER — Other Ambulatory Visit: Payer: Self-pay | Admitting: Dermatology

## 2015-04-12 ENCOUNTER — Encounter: Payer: Self-pay | Admitting: *Deleted

## 2015-04-20 ENCOUNTER — Encounter: Payer: Self-pay | Admitting: Cardiovascular Disease

## 2015-04-27 ENCOUNTER — Encounter: Payer: Self-pay | Admitting: Cardiovascular Disease

## 2015-07-11 ENCOUNTER — Encounter (HOSPITAL_COMMUNITY): Payer: Medicare Other

## 2015-07-11 ENCOUNTER — Ambulatory Visit: Payer: Medicare Other | Admitting: Family

## 2016-01-18 DIAGNOSIS — D034 Melanoma in situ of scalp and neck: Secondary | ICD-10-CM | POA: Insufficient documentation

## 2019-04-30 ENCOUNTER — Emergency Department (HOSPITAL_BASED_OUTPATIENT_CLINIC_OR_DEPARTMENT_OTHER)
Admission: EM | Admit: 2019-04-30 | Discharge: 2019-04-30 | Disposition: A | Discharge status: Home / Self Care | Payer: Medicare Other | Attending: Emergency Medicine | Admitting: Emergency Medicine

## 2019-04-30 ENCOUNTER — Encounter (HOSPITAL_BASED_OUTPATIENT_CLINIC_OR_DEPARTMENT_OTHER): Payer: Self-pay | Admitting: Emergency Medicine

## 2019-04-30 ENCOUNTER — Other Ambulatory Visit: Payer: Self-pay

## 2019-04-30 ENCOUNTER — Emergency Department (HOSPITAL_BASED_OUTPATIENT_CLINIC_OR_DEPARTMENT_OTHER): Payer: Medicare Other

## 2019-04-30 DIAGNOSIS — S0101XA Laceration without foreign body of scalp, initial encounter: Secondary | ICD-10-CM

## 2019-04-30 DIAGNOSIS — Z794 Long term (current) use of insulin: Secondary | ICD-10-CM | POA: Insufficient documentation

## 2019-04-30 DIAGNOSIS — I10 Essential (primary) hypertension: Secondary | ICD-10-CM | POA: Insufficient documentation

## 2019-04-30 DIAGNOSIS — Z79899 Other long term (current) drug therapy: Secondary | ICD-10-CM | POA: Diagnosis not present

## 2019-04-30 DIAGNOSIS — Y999 Unspecified external cause status: Secondary | ICD-10-CM | POA: Insufficient documentation

## 2019-04-30 DIAGNOSIS — Z87891 Personal history of nicotine dependence: Secondary | ICD-10-CM | POA: Insufficient documentation

## 2019-04-30 DIAGNOSIS — W010XXA Fall on same level from slipping, tripping and stumbling without subsequent striking against object, initial encounter: Secondary | ICD-10-CM | POA: Diagnosis not present

## 2019-04-30 DIAGNOSIS — E119 Type 2 diabetes mellitus without complications: Secondary | ICD-10-CM | POA: Diagnosis not present

## 2019-04-30 DIAGNOSIS — Y9301 Activity, walking, marching and hiking: Secondary | ICD-10-CM | POA: Insufficient documentation

## 2019-04-30 DIAGNOSIS — Y929 Unspecified place or not applicable: Secondary | ICD-10-CM | POA: Insufficient documentation

## 2019-04-30 DIAGNOSIS — T148XXA Other injury of unspecified body region, initial encounter: Secondary | ICD-10-CM | POA: Diagnosis not present

## 2019-04-30 DIAGNOSIS — S0990XA Unspecified injury of head, initial encounter: Secondary | ICD-10-CM | POA: Diagnosis present

## 2019-04-30 NOTE — Discharge Instructions (Signed)
Staple removal in 10-14 days.

## 2019-04-30 NOTE — ED Notes (Signed)
ED Provider at bedside. 

## 2019-04-30 NOTE — ED Notes (Addendum)
Patient is resting at this time.

## 2019-04-30 NOTE — ED Triage Notes (Signed)
Pt fell this morning after feeling sharp pain in right hip.  Lac to back of head and skin tear to right elbow.  No LOC.

## 2019-04-30 NOTE — ED Notes (Signed)
Right upper arm  Bruised and a small open area covered with mepitel dressing

## 2019-04-30 NOTE — ED Notes (Signed)
Discharge instructions given to wife over phone, including wound care and follow up.

## 2019-04-30 NOTE — ED Provider Notes (Signed)
[facility] EMERGENCY DEPARTMENT Provider Note   CSN: [id number] Arrival date & time: 04/30/19  2119     History   Chief Complaint Chief Complaint  Patient presents with   Fall    HPI [name] is a 85 y.o. male.     HPI   85 year old male presented mechanical fall.  Patient reports ongoing left hip pain.  He was ambulating with his walker today when he had a twinge of pain in his left hip causing him to lose his balance and fall.  He fell backwards and struck the back of his head.  No LOC.  Did sustain a laceration to the back of his head.  Mild headache.  Also skin tears to right forearm, right elbow and left elbow.  He thinks his tetanus is current.  He is not anticoagulated.  Past Medical History:  Diagnosis Date   Anemia    related to frequent nosebleeds-right nostril.   Arthritis    arthritis -back knees, most joints   Bleeding nose    occasional   Breathing difficulty 11-10-12   difficult to breath if laying posteriorly, right nare difficulty   Carotid artery occlusion    Change in hearing    Diabetes mellitus age 41   Dysrhythmia 07/2012   hx. Atrial Flutter x1, converted spontaneously-no ploblems.    H/O hiatal hernia    HOH (hard of hearing) 11-10-12   bilaterally   Hyperlipidemia    Hypertension    Joint pain    Ulcer     Patient Active Problem List   Diagnosis Date Noted   Essential hypertension 11/05/2013   Hyperlipidemia 11/05/2013   Diabetes ([place]) 11/05/2013   Aftercare following surgery of the circulatory system, NEC 06/30/2013   Primary osteoarthritis of right knee 11/18/2012   Occlusion and stenosis of carotid artery without mention of cerebral infarction 06/30/2012    Past Surgical History:  Procedure Laterality Date   CAROTID DUPLEX  06/30/2012   PATENT RIGHT CAROTID ENDARTERECTOMY. 1%-39% LEFT ICA. STABLE   CAROTID ENDARTERECTOMY  11/242008   right with Dacron patch angioplasty   CATARACT EXTRACTION   1998 bilateral  done 3 months apart   EYE SURGERY     JOINT REPLACEMENT Right 11-18-12   KNEE BURSECTOMY  11/18/2012   Procedure: KNEE BURSECTOMY;  Surgeon: Tobi Bastos, MD;  Location: [facility];  Service: Orthopedics;  Laterality: Right;   NM MYOCAR MULTIPLE W/SPECT  10/08/2012   NORMAL STRESS NUCLEAR STUDY.   TOTAL KNEE ARTHROPLASTY  11/18/2012   Procedure: TOTAL KNEE ARTHROPLASTY;  Surgeon: Tobi Bastos, MD;  Location: [facility];  Service: Orthopedics;  Laterality: Right;   TRANSTHORACIC ECHOCARDIOGRAM  10/08/2012   MODERATE LVH. MODERATE CONCENTRIC HYPERTROPHY.EF 65 TO 70%. GRADE 1 DYSTOLIC DYSFUNCTION. ELEVATED VENTRICULAR END-DIASTOLIC FILLING PRESSURE AND LEFT ATRIAL FILLING PRESSURE. AV- MILD TO MODERATE CALCIFIED ANNULUS. MV- CALCIFIC DEGENERATION.Marland Kitchen LA-MILDLY DILATED.        Home Medications    Prior to Admission medications   Medication Sig Start Date End Date Taking? Authorizing Provider  acetaminophen (TYLENOL) 500 MG tablet Take 500 mg by mouth every 6 (six) hours as needed. Pain    [provider]  atorvastatin (LIPITOR) 80 MG tablet Take 40 mg by mouth every evening. Takes 1/2    [provider]  Calcium-Vitamin D (CALTRATE 600 PLUS-VIT D PO) Take by mouth daily.    [provider]  Cholecalciferol (VITAMIN D3) 2000 UNITS TABS Take by mouth daily.  [provider]  diltiazem (CARDIZEM CD) 120 MG 24 hr capsule Take 120 mg by mouth daily. 10/29/13   [provider]  hydrochlorothiazide (HYDRODIURIL) 25 MG tablet Take 12.5 mg by mouth daily. 09/24/13   [provider]  insulin detemir (LEVEMIR) 100 UNIT/ML injection Inject 10-30 Units into the skin 2 (two) times daily. Takes 40 units in the morning and 10 units at night    [provider]  losartan (COZAAR) 100 MG tablet Take 100 mg by mouth daily before breakfast.     [provider]  metFORMIN (GLUCOPHAGE) 1000 MG tablet Take 1,000 mg by mouth 2  (two) times daily with a meal.    [provider]  traZODone (DESYREL) 100 MG tablet Take 100 mg by mouth at bedtime.      [provider]    Family History Family History  Problem Relation Age of Onset   Cancer Mother        Pancreatic   Heart disease Mother    Cancer Brother        pancreactic   Cancer Daughter        Breast    Social History Social History   Tobacco Use   Smoking status: Former Smoker    Types: Cigarettes    Quit date: 10/21/1976    Years since quitting: 42.5   Smokeless tobacco: Never Used  Substance Use Topics   Alcohol use: Yes    Alcohol/week: 1.0 standard drinks    Types: 1 Glasses of wine per week    Comment: 1-2 alcoholic drinks daily   Drug use: No     Allergies   Patient has no known allergies.   Review of Systems Review of Systems  All systems reviewed and negative, other than as noted in HPI.  Physical Exam Updated Vital Signs BP (!) 155/76 (BP Location: Right Arm)    Pulse 65    Temp 97.8 F (36.6 C) (Oral)    Resp 16    Ht 6\' 1"  (1.854 m)    Wt 86.2 kg    SpO2 98%    BMI 25.07 kg/m   Physical Exam Vitals signs and nursing note reviewed.  Constitutional:      General: He is not in acute distress.    Appearance: He is well-developed.  HENT:     Head: Normocephalic.      Comments: 2 cm laceration in pictured area.  Mild localized tenderness. Eyes:     General:        Right eye: No discharge.        Left eye: No discharge.     Conjunctiva/sclera: Conjunctivae normal.  Neck:     Musculoskeletal: Neck supple.  Cardiovascular:     Rate and Rhythm: Normal rate and regular rhythm.     Heart sounds: Normal heart sounds. No murmur. No friction rub. No gallop.   Pulmonary:     Effort: Pulmonary effort is normal. No respiratory distress.     Breath sounds: Normal breath sounds.  Abdominal:     General: There is no distension.     Palpations: Abdomen is soft.     Tenderness: There is no abdominal  tenderness.  Musculoskeletal:       Arms:     Comments: Large skin tears to right proximal forearm approximately 8 cm in total length, right posterior upper arm approximately 6 cm in total length, left posterior upper arm approximately 2 cm in total length.  No significant  bony tenderness over the extremities.  No apparent pain with range of motion of the large joints aside from reported some mild discomfort with range of motion of the left hip.  Neurovascularly intact.  Skin:    General: Skin is warm and dry.  Neurological:     Mental Status: He is alert.  Psychiatric:        Behavior: Behavior normal.        Thought Content: Thought content normal.      ED Treatments / Results  Labs (all labs ordered are listed, but only abnormal results are displayed) Labs Reviewed - No data to display  EKG None  Radiology Ct Head Wo Contrast  Result Date: 04/30/2019 CLINICAL DATA:  Fall this morning, RIGHT hip pain, laceration to back of head. EXAM: CT HEAD WITHOUT CONTRAST CT CERVICAL SPINE WITHOUT CONTRAST TECHNIQUE: Multidetector CT imaging of the head and cervical spine was performed following the standard protocol without intravenous contrast. Multiplanar CT image reconstructions of the cervical spine were also generated. COMPARISON:  None. FINDINGS: CT HEAD FINDINGS Brain: Minimal chronic small vessel ischemic change within the deep periventricular white matter. No mass, hemorrhage, edema or other evidence of acute parenchymal abnormality. No extra-axial hemorrhage. Vascular: Chronic calcified atherosclerotic changes of the large vessels at the skull base. No unexpected hyperdense vessel. Skull: Normal. Negative for fracture or focal lesion. Sinuses/Orbits: No acute finding. Other: None. CT CERVICAL SPINE FINDINGS Alignment: Within normal limits. No evidence of acute vertebral body subluxation. Skull base and vertebrae: No fracture line or displaced fracture fragment seen. Facet joints are  normally aligned. Soft tissues and spinal canal: No prevertebral fluid or swelling. No visible canal hematoma. Disc levels: Degenerative spondylosis throughout the cervical spine, mild to moderate in degree, but with no more than mild central canal stenosis at any level. Upper chest: Negative Other: Bilateral carotid and vertebral artery atherosclerosis. IMPRESSION: 1. No evidence of acute intracranial abnormality. No intracranial hemorrhage or edema. No skull fracture. Minimal chronic small vessel ischemic changes within the white matter. 2. No evidence of acute fracture or subluxation of the cervical spine. Degenerative spondylosis, mild to moderate in degree. 3. Bilateral carotid and vertebral artery atherosclerosis. Electronically Signed   By: Franki Cabot M.D.   On: 04/30/2019 09:51   Ct Cervical Spine Wo Contrast  Result Date: 04/30/2019 CLINICAL DATA:  Fall this morning, RIGHT hip pain, laceration to back of head. EXAM: CT HEAD WITHOUT CONTRAST CT CERVICAL SPINE WITHOUT CONTRAST TECHNIQUE: Multidetector CT imaging of the head and cervical spine was performed following the standard protocol without intravenous contrast. Multiplanar CT image reconstructions of the cervical spine were also generated. COMPARISON:  None. FINDINGS: CT HEAD FINDINGS Brain: Minimal chronic small vessel ischemic change within the deep periventricular white matter. No mass, hemorrhage, edema or other evidence of acute parenchymal abnormality. No extra-axial hemorrhage. Vascular: Chronic calcified atherosclerotic changes of the large vessels at the skull base. No unexpected hyperdense vessel. Skull: Normal. Negative for fracture or focal lesion. Sinuses/Orbits: No acute finding. Other: None. CT CERVICAL SPINE FINDINGS Alignment: Within normal limits. No evidence of acute vertebral body subluxation. Skull base and vertebrae: No fracture line or displaced fracture fragment seen. Facet joints are normally aligned. Soft tissues and  spinal canal: No prevertebral fluid or swelling. No visible canal hematoma. Disc levels: Degenerative spondylosis throughout the cervical spine, mild to moderate in degree, but with no more than mild central canal stenosis at any level. Upper chest: Negative Other: Bilateral carotid and vertebral artery  atherosclerosis. IMPRESSION: 1. No evidence of acute intracranial abnormality. No intracranial hemorrhage or edema. No skull fracture. Minimal chronic small vessel ischemic changes within the white matter. 2. No evidence of acute fracture or subluxation of the cervical spine. Degenerative spondylosis, mild to moderate in degree. 3. Bilateral carotid and vertebral artery atherosclerosis. Electronically Signed   By: Franki Cabot M.D.   On: 04/30/2019 09:51   Ct Hip Left Wo Contrast  Result Date: 04/30/2019 CLINICAL DATA:  Status post fall, left hip pain EXAM: CT OF THE LEFT HIP WITHOUT CONTRAST TECHNIQUE: Multidetector CT imaging of the left hip was performed according to the standard protocol. Multiplanar CT image reconstructions were also generated. COMPARISON:  None. FINDINGS: Bones/Joint/Cartilage No acute fracture or dislocation. Normal alignment. No joint effusion. Mild left hip joint space narrowing with tiny marginal osteophytes. No aggressive osseous lesion. Benign chondroid lesion likely reflecting a enchondroma in the supra-acetabular region. Ligaments Ligaments are suboptimally evaluated by CT. Muscles and Tendons Muscles are normal.  No muscle atrophy. Soft tissue No fluid collection or hematoma. No soft tissue mass. Small fat containing left inguinal hernia. Peripheral vascular atherosclerotic disease. IMPRESSION: No acute osseous injury of the left hip. Mild osteoarthritis of the left hip. Electronically Signed   By: Kathreen Devoid   On: 04/30/2019 10:13    Procedures Procedures (including critical care time)  LACERATION REPAIR Performed by: Virgel Manifold Authorized by: Virgel Manifold Consent:  Verbal consent obtained. Risks and benefits: risks, benefits and alternatives were discussed Consent given by: patient Patient identity confirmed: provided demographic data Prepped and Draped in normal sterile fashion Wound explored  Laceration Location: Scalp  Laceration Length: 2 cm  No Foreign Bodies seen or palpated  Anesthesia: none  Local anesthetic: none  Anesthetic total: n/a  Irrigation method: syringe Amount of cleaning: standard  Skin closure: staples  Number of sutures: 3  Patient tolerance: Patient tolerated the procedure well with no immediate complications.  Medications Ordered in ED Medications - No data to display   Initial Impression / Assessment and Plan / ED Course  I have reviewed the triage vital signs and the nursing notes.  Pertinent labs & imaging results that were available during my care of the patient were reviewed by me and considered in my medical decision making (see chart for details).      85 year old male status post mechanical fall.  Laceration the back of the head was repaired with staples.  Skin tears were repaired with adhesive bandages.  Will CT his head and neck.  Initially plan for pain a plain films of his hip.  I suspect that his pain in his hip is more his chronic pain and not a result of his his acute trauma today.  Patient is requesting CAT scan.  He states that he already had recent x-rays that showed arthritis of his hip.  I do not think that CT of his hip is completely unreasonable and may be of some more benefit to his orthopedist who he will be seeing on Monday.  Imaging ok. Continued wound care and return precautions discussed.   Final Clinical Impressions(s) / ED Diagnoses   Final diagnoses:  Laceration of scalp, initial encounter  Multiple skin tears    ED Discharge Orders    None       Virgel Manifold, MD 05/02/19 [phone]

## 2019-06-06 ENCOUNTER — Inpatient Hospital Stay (HOSPITAL_BASED_OUTPATIENT_CLINIC_OR_DEPARTMENT_OTHER)
Admission: EM | Admit: 2019-06-06 | Discharge: 2019-06-15 | DRG: 418 | Disposition: A | Discharge status: Home Health | Payer: Medicare Other | Attending: Internal Medicine | Admitting: Internal Medicine

## 2019-06-06 ENCOUNTER — Other Ambulatory Visit: Payer: Self-pay

## 2019-06-06 ENCOUNTER — Emergency Department (HOSPITAL_BASED_OUTPATIENT_CLINIC_OR_DEPARTMENT_OTHER): Payer: Medicare Other

## 2019-06-06 ENCOUNTER — Emergency Department (HOSPITAL_COMMUNITY): Payer: Medicare Other

## 2019-06-06 ENCOUNTER — Encounter (HOSPITAL_BASED_OUTPATIENT_CLINIC_OR_DEPARTMENT_OTHER): Payer: Self-pay | Admitting: Emergency Medicine

## 2019-06-06 DIAGNOSIS — I480 Paroxysmal atrial fibrillation: Secondary | ICD-10-CM | POA: Diagnosis not present

## 2019-06-06 DIAGNOSIS — N179 Acute kidney failure, unspecified: Secondary | ICD-10-CM | POA: Diagnosis not present

## 2019-06-06 DIAGNOSIS — Z803 Family history of malignant neoplasm of breast: Secondary | ICD-10-CM | POA: Diagnosis not present

## 2019-06-06 DIAGNOSIS — Z87891 Personal history of nicotine dependence: Secondary | ICD-10-CM

## 2019-06-06 DIAGNOSIS — Z66 Do not resuscitate: Secondary | ICD-10-CM | POA: Diagnosis not present

## 2019-06-06 DIAGNOSIS — E785 Hyperlipidemia, unspecified: Secondary | ICD-10-CM | POA: Diagnosis not present

## 2019-06-06 DIAGNOSIS — I4510 Unspecified right bundle-branch block: Secondary | ICD-10-CM | POA: Diagnosis not present

## 2019-06-06 DIAGNOSIS — K82A1 Gangrene of gallbladder in cholecystitis: Secondary | ICD-10-CM | POA: Diagnosis present

## 2019-06-06 DIAGNOSIS — E876 Hypokalemia: Secondary | ICD-10-CM | POA: Diagnosis not present

## 2019-06-06 DIAGNOSIS — K449 Diaphragmatic hernia without obstruction or gangrene: Secondary | ICD-10-CM | POA: Diagnosis present

## 2019-06-06 DIAGNOSIS — D490 Neoplasm of unspecified behavior of digestive system: Secondary | ICD-10-CM | POA: Diagnosis not present

## 2019-06-06 DIAGNOSIS — E119 Type 2 diabetes mellitus without complications: Secondary | ICD-10-CM

## 2019-06-06 DIAGNOSIS — M159 Polyosteoarthritis, unspecified: Secondary | ICD-10-CM | POA: Diagnosis not present

## 2019-06-06 DIAGNOSIS — E860 Dehydration: Secondary | ICD-10-CM | POA: Diagnosis not present

## 2019-06-06 DIAGNOSIS — Z96651 Presence of right artificial knee joint: Secondary | ICD-10-CM | POA: Diagnosis present

## 2019-06-06 DIAGNOSIS — E11649 Type 2 diabetes mellitus with hypoglycemia without coma: Secondary | ICD-10-CM | POA: Diagnosis not present

## 2019-06-06 DIAGNOSIS — R52 Pain, unspecified: Secondary | ICD-10-CM

## 2019-06-06 DIAGNOSIS — I4892 Unspecified atrial flutter: Secondary | ICD-10-CM | POA: Diagnosis not present

## 2019-06-06 DIAGNOSIS — K2900 Acute gastritis without bleeding: Secondary | ICD-10-CM | POA: Diagnosis not present

## 2019-06-06 DIAGNOSIS — K2970 Gastritis, unspecified, without bleeding: Secondary | ICD-10-CM | POA: Diagnosis present

## 2019-06-06 DIAGNOSIS — R1010 Upper abdominal pain, unspecified: Secondary | ICD-10-CM

## 2019-06-06 DIAGNOSIS — E871 Hypo-osmolality and hyponatremia: Secondary | ICD-10-CM | POA: Diagnosis not present

## 2019-06-06 DIAGNOSIS — K869 Disease of pancreas, unspecified: Secondary | ICD-10-CM | POA: Diagnosis present

## 2019-06-06 DIAGNOSIS — K8013 Calculus of gallbladder with acute and chronic cholecystitis with obstruction: Secondary | ICD-10-CM | POA: Diagnosis not present

## 2019-06-06 DIAGNOSIS — Z20828 Contact with and (suspected) exposure to other viral communicable diseases: Secondary | ICD-10-CM | POA: Diagnosis not present

## 2019-06-06 DIAGNOSIS — R112 Nausea with vomiting, unspecified: Secondary | ICD-10-CM

## 2019-06-06 DIAGNOSIS — R1084 Generalized abdominal pain: Secondary | ICD-10-CM

## 2019-06-06 DIAGNOSIS — R1013 Epigastric pain: Secondary | ICD-10-CM | POA: Diagnosis present

## 2019-06-06 DIAGNOSIS — H9190 Unspecified hearing loss, unspecified ear: Secondary | ICD-10-CM | POA: Diagnosis present

## 2019-06-06 DIAGNOSIS — Z800 Family history of malignant neoplasm of digestive organs: Secondary | ICD-10-CM | POA: Diagnosis not present

## 2019-06-06 DIAGNOSIS — I10 Essential (primary) hypertension: Secondary | ICD-10-CM | POA: Diagnosis present

## 2019-06-06 DIAGNOSIS — Z794 Long term (current) use of insulin: Secondary | ICD-10-CM | POA: Diagnosis not present

## 2019-06-06 DIAGNOSIS — R1011 Right upper quadrant pain: Secondary | ICD-10-CM

## 2019-06-06 DIAGNOSIS — I4891 Unspecified atrial fibrillation: Secondary | ICD-10-CM

## 2019-06-06 DIAGNOSIS — K8689 Other specified diseases of pancreas: Secondary | ICD-10-CM

## 2019-06-06 DIAGNOSIS — K8020 Calculus of gallbladder without cholecystitis without obstruction: Secondary | ICD-10-CM

## 2019-06-06 DIAGNOSIS — R109 Unspecified abdominal pain: Secondary | ICD-10-CM | POA: Diagnosis present

## 2019-06-06 DIAGNOSIS — R509 Fever, unspecified: Secondary | ICD-10-CM

## 2019-06-06 HISTORY — DX: Disorder of arteries and arterioles, unspecified: I77.9

## 2019-06-06 HISTORY — DX: Unspecified atrial flutter: I48.92

## 2019-06-06 HISTORY — DX: Nonrheumatic aortic (valve) insufficiency: I35.1

## 2019-06-06 LAB — CBC WITH DIFFERENTIAL/PLATELET
Abs Immature Granulocytes: 0.05 10*3/uL (ref 0.00–0.07)
Basophils Absolute: 0 10*3/uL (ref 0.0–0.1)
Basophils Relative: 0 %
Eosinophils Absolute: 0 10*3/uL (ref 0.0–0.5)
Eosinophils Relative: 0 %
HCT: 41.4 % (ref 39.0–52.0)
Hemoglobin: 13.6 g/dL (ref 13.0–17.0)
Immature Granulocytes: 0 %
Lymphocytes Relative: 6 %
Lymphs Abs: 0.8 10*3/uL (ref 0.7–4.0)
MCH: 31.7 pg (ref 26.0–34.0)
MCHC: 32.9 g/dL (ref 30.0–36.0)
MCV: 96.5 fL (ref 80.0–100.0)
Monocytes Absolute: 0.7 10*3/uL (ref 0.1–1.0)
Monocytes Relative: 6 %
Neutro Abs: 11.8 10*3/uL — ABNORMAL HIGH (ref 1.7–7.7)
Neutrophils Relative %: 88 %
Platelets: 230 10*3/uL (ref 150–400)
RBC: 4.29 MIL/uL (ref 4.22–5.81)
RDW: 13.6 % (ref 11.5–15.5)
WBC: 13.5 10*3/uL — ABNORMAL HIGH (ref 4.0–10.5)
nRBC: 0 % (ref 0.0–0.2)

## 2019-06-06 LAB — COMPREHENSIVE METABOLIC PANEL
ALT: 13 U/L (ref 0–44)
AST: 15 U/L (ref 15–41)
Albumin: 4 g/dL (ref 3.5–5.0)
Alkaline Phosphatase: 91 U/L (ref 38–126)
Anion gap: 16 — ABNORMAL HIGH (ref 5–15)
BUN: 15 mg/dL (ref 8–23)
CO2: 24 mmol/L (ref 22–32)
Calcium: 9.8 mg/dL (ref 8.9–10.3)
Chloride: 97 mmol/L — ABNORMAL LOW (ref 98–111)
Creatinine, Ser: 0.94 mg/dL (ref 0.61–1.24)
GFR calc Af Amer: >60 mL/min (ref >60)
GFR calc non Af Amer: >60 mL/min (ref >60)
Glucose, Bld: 215 mg/dL — ABNORMAL HIGH (ref 70–99)
Potassium: 3.8 mmol/L (ref 3.5–5.1)
Sodium: 137 mmol/L (ref 135–145)
Total Bilirubin: 1.1 mg/dL (ref 0.3–1.2)
Total Protein: 7.5 g/dL (ref 6.5–8.1)

## 2019-06-06 LAB — URINALYSIS, ROUTINE W REFLEX MICROSCOPIC
Bilirubin Urine: NEGATIVE
Glucose, UA: >=500 mg/dL — AB
Ketones, ur: 40 mg/dL — AB
Leukocytes,Ua: NEGATIVE
Nitrite: NEGATIVE
Protein, ur: >300 mg/dL — AB
Specific Gravity, Urine: >1.03 — ABNORMAL HIGH (ref 1.005–1.030)
pH: 6.5 (ref 5.0–8.0)

## 2019-06-06 LAB — SARS CORONAVIRUS 2 (TAT 6-24 HRS): SARS Coronavirus 2: NEGATIVE

## 2019-06-06 LAB — URINALYSIS, MICROSCOPIC (REFLEX)
Bacteria, UA: NONE SEEN
Squamous Epithelial / HPF: NONE SEEN (ref 0–5)

## 2019-06-06 LAB — HEMOGLOBIN A1C
Hgb A1c MFr Bld: 7 % — ABNORMAL HIGH (ref 4.8–5.6)
Mean Plasma Glucose: 154.2 mg/dL

## 2019-06-06 LAB — GLUCOSE, CAPILLARY
Glucose-Capillary: 306 mg/dL — ABNORMAL HIGH (ref 70–99)
Glucose-Capillary: 243 mg/dL — ABNORMAL HIGH (ref 70–99)
Glucose-Capillary: 50 mg/dL — ABNORMAL LOW (ref 70–99)
Glucose-Capillary: 79 mg/dL (ref 70–99)

## 2019-06-06 LAB — LIPASE, BLOOD: Lipase: 21 U/L (ref 11–51)

## 2019-06-06 LAB — CBG MONITORING, ED: Glucose-Capillary: 190 mg/dL — ABNORMAL HIGH (ref 70–99)

## 2019-06-06 MED ORDER — LOSARTAN POTASSIUM 50 MG PO TABS
100.0000 mg | ORAL_TABLET | Freq: Every day | ORAL | Status: DC
  Administered 2019-06-06 – 2019-06-08 (×3): 100 mg via ORAL
  Filled 2019-06-06 (×3): qty 2

## 2019-06-06 MED ORDER — ATORVASTATIN CALCIUM 40 MG PO TABS
40.0000 mg | ORAL_TABLET | Freq: Every evening | ORAL | Status: DC
  Administered 2019-06-06 – 2019-06-14 (×9): 40 mg via ORAL
  Filled 2019-06-06 (×10): qty 1

## 2019-06-06 MED ORDER — INSULIN DETEMIR 100 UNIT/ML ~~LOC~~ SOLN
10.0000 [IU] | Freq: Every day | SUBCUTANEOUS | Status: DC
  Filled 2019-06-06 (×2): qty 0.1

## 2019-06-06 MED ORDER — GABAPENTIN 100 MG PO CAPS
100.0000 mg | ORAL_CAPSULE | Freq: Three times a day (TID) | ORAL | Status: DC
  Administered 2019-06-06 – 2019-06-15 (×26): 100 mg via ORAL
  Filled 2019-06-06 (×28): qty 1

## 2019-06-06 MED ORDER — PANTOPRAZOLE SODIUM 40 MG IV SOLR
40.0000 mg | Freq: Two times a day (BID) | INTRAVENOUS | Status: DC
  Administered 2019-06-06 – 2019-06-15 (×19): 40 mg via INTRAVENOUS
  Filled 2019-06-06 (×19): qty 40

## 2019-06-06 MED ORDER — INSULIN ASPART 100 UNIT/ML ~~LOC~~ SOLN
0.0000 [IU] | Freq: Three times a day (TID) | SUBCUTANEOUS | Status: DC
  Administered 2019-06-06: 12:00:00 3 [IU] via SUBCUTANEOUS
  Administered 2019-06-06: 5 [IU] via SUBCUTANEOUS
  Administered 2019-06-07: 2 [IU] via SUBCUTANEOUS

## 2019-06-06 MED ORDER — FENTANYL CITRATE (PF) 100 MCG/2ML IJ SOLN
50.0000 ug | INTRAMUSCULAR | Status: DC | PRN
  Administered 2019-06-06 – 2019-06-15 (×15): 50 ug via INTRAVENOUS
  Filled 2019-06-06 (×16): qty 2

## 2019-06-06 MED ORDER — GLUCOSE 40 % PO GEL
ORAL | Status: AC
  Filled 2019-06-06: qty 1

## 2019-06-06 MED ORDER — ASPIRIN EC 81 MG PO TBEC
81.0000 mg | DELAYED_RELEASE_TABLET | Freq: Every day | ORAL | Status: DC
  Administered 2019-06-06 – 2019-06-07 (×2): 81 mg via ORAL
  Filled 2019-06-06 (×2): qty 1

## 2019-06-06 MED ORDER — PIPERACILLIN-TAZOBACTAM 3.375 G IVPB 30 MIN
3.3750 g | Freq: Once | INTRAVENOUS | Status: AC
  Administered 2019-06-06: 08:00:00 3.375 g via INTRAVENOUS
  Filled 2019-06-06: qty 50

## 2019-06-06 MED ORDER — INSULIN DETEMIR 100 UNIT/ML ~~LOC~~ SOLN
40.0000 [IU] | Freq: Every morning | SUBCUTANEOUS | Status: DC
  Administered 2019-06-06 – 2019-06-07 (×2): 40 [IU] via SUBCUTANEOUS
  Filled 2019-06-06 (×2): qty 0.4

## 2019-06-06 MED ORDER — PANTOPRAZOLE SODIUM 40 MG IV SOLR
40.0000 mg | Freq: Once | INTRAVENOUS | Status: AC
  Administered 2019-06-06: 01:00:00 40 mg via INTRAVENOUS
  Filled 2019-06-06: qty 40

## 2019-06-06 MED ORDER — ENOXAPARIN SODIUM 40 MG/0.4ML ~~LOC~~ SOLN
40.0000 mg | SUBCUTANEOUS | Status: DC
  Administered 2019-06-06 – 2019-06-15 (×8): 40 mg via SUBCUTANEOUS
  Filled 2019-06-06 (×9): qty 0.4

## 2019-06-06 MED ORDER — ONDANSETRON HCL 4 MG/2ML IJ SOLN
4.0000 mg | Freq: Four times a day (QID) | INTRAMUSCULAR | Status: DC | PRN
  Administered 2019-06-06 – 2019-06-11 (×4): 4 mg via INTRAVENOUS
  Filled 2019-06-06 (×3): qty 2

## 2019-06-06 MED ORDER — FENTANYL CITRATE (PF) 100 MCG/2ML IJ SOLN
50.0000 ug | INTRAMUSCULAR | Status: DC | PRN
  Administered 2019-06-06: 01:00:00 50 ug via INTRAVENOUS
  Filled 2019-06-06 (×2): qty 2

## 2019-06-06 MED ORDER — ACETAMINOPHEN 650 MG RE SUPP: 650.0000 mg | Freq: Four times a day (QID) | RECTAL | Status: DC | PRN

## 2019-06-06 MED ORDER — FENTANYL CITRATE (PF) 100 MCG/2ML IJ SOLN
50.0000 ug | INTRAMUSCULAR | Status: DC | PRN
  Administered 2019-06-06: 07:00:00 50 ug via INTRAVENOUS

## 2019-06-06 MED ORDER — INSULIN DETEMIR 100 UNIT/ML ~~LOC~~ SOLN: 10.0000 [IU] | Freq: Two times a day (BID) | SUBCUTANEOUS | Status: DC

## 2019-06-06 MED ORDER — DOCUSATE SODIUM 100 MG PO CAPS
100.0000 mg | ORAL_CAPSULE | Freq: Two times a day (BID) | ORAL | Status: DC
  Administered 2019-06-06 – 2019-06-15 (×18): 100 mg via ORAL
  Filled 2019-06-06 (×18): qty 1

## 2019-06-06 MED ORDER — SUCRALFATE 1 G PO TABS
1.0000 g | ORAL_TABLET | Freq: Three times a day (TID) | ORAL | Status: DC
  Administered 2019-06-06 – 2019-06-15 (×31): 1 g via ORAL
  Filled 2019-06-06 (×33): qty 1

## 2019-06-06 MED ORDER — ONDANSETRON HCL 4 MG PO TABS: 4.0000 mg | ORAL_TABLET | Freq: Four times a day (QID) | ORAL | Status: DC | PRN

## 2019-06-06 MED ORDER — LACTATED RINGERS IV SOLN
INTRAVENOUS | Status: DC
  Administered 2019-06-06 (×2): via INTRAVENOUS

## 2019-06-06 MED ORDER — IOHEXOL 350 MG/ML SOLN
100.0000 mL | Freq: Once | INTRAVENOUS | Status: AC | PRN
  Administered 2019-06-06: 02:00:00 100 mL via INTRAVENOUS

## 2019-06-06 MED ORDER — ACETAMINOPHEN 325 MG PO TABS
650.0000 mg | ORAL_TABLET | Freq: Four times a day (QID) | ORAL | Status: DC | PRN
  Administered 2019-06-07 – 2019-06-12 (×2): 650 mg via ORAL
  Filled 2019-06-06 (×2): qty 2

## 2019-06-06 MED ORDER — TRAZODONE HCL 100 MG PO TABS
100.0000 mg | ORAL_TABLET | Freq: Every day | ORAL | Status: DC
  Administered 2019-06-06 – 2019-06-14 (×9): 100 mg via ORAL
  Filled 2019-06-06 (×9): qty 1

## 2019-06-06 MED ORDER — DILTIAZEM HCL ER COATED BEADS 180 MG PO CP24
180.0000 mg | ORAL_CAPSULE | Freq: Every day | ORAL | Status: DC
  Administered 2019-06-06 – 2019-06-15 (×10): 180 mg via ORAL
  Filled 2019-06-06 (×11): qty 1

## 2019-06-06 MED ORDER — SODIUM CHLORIDE 0.9% FLUSH
3.0000 mL | Freq: Two times a day (BID) | INTRAVENOUS | Status: DC
  Administered 2019-06-06 – 2019-06-13 (×7): 3 mL via INTRAVENOUS

## 2019-06-06 MED ORDER — HYDROCHLOROTHIAZIDE 25 MG PO TABS
25.0000 mg | ORAL_TABLET | Freq: Every day | ORAL | Status: DC
  Administered 2019-06-06 – 2019-06-08 (×3): 25 mg via ORAL
  Filled 2019-06-06 (×3): qty 1

## 2019-06-06 NOTE — ED Notes (Signed)
Dr. Yates at bedside.  

## 2019-06-06 NOTE — ED Notes (Signed)
Pt stated he did not want pain meds at this time. Told to call if he changes his mind

## 2019-06-06 NOTE — ED Notes (Signed)
ED TO INPATIENT HANDOFF REPORT  ED Nurse Name and Phone #: Gilda Crease Name/Age/Gender [name] 85 y.o. male Room/Bed: MH09/MH09  Code Status   Code Status: Prior  Home/SNF/Other Home Patient oriented to: self, place, time and situation Is this baseline? Yes   Triage Complete: Triage complete  Chief Complaint No admission diagnoses are documented for this encounter.  Triage Note Pt BIB [facility] for abdominal pain since around 1100 that has gotten worse. Has been vomiitng. Was given 4mg  zofran   Allergies No Known Allergies  Level of Care/Admitting Diagnosis ED Disposition    None      B Medical/Surgery History Past Medical History:  Diagnosis Date  . Anemia    related to frequent nosebleeds-right nostril.  . Arthritis    arthritis -back knees, most joints  . Bleeding nose    occasional  . Breathing difficulty 11-10-12   difficult to breath if laying posteriorly, right nare difficulty  . Carotid artery occlusion   . Change in hearing   . Diabetes mellitus age 29  . Dysrhythmia 07/2012   hx. Atrial Flutter x1, converted spontaneously-no ploblems.   . H/O hiatal hernia   . HOH (hard of hearing) 11-10-12   bilaterally  . Hyperlipidemia   . Hypertension   . Joint pain   . Ulcer    Past Surgical History:  Procedure Laterality Date  . CAROTID DUPLEX  06/30/2012   PATENT RIGHT CAROTID ENDARTERECTOMY. 1%-39% LEFT ICA. STABLE  . CAROTID ENDARTERECTOMY  11/242008   right with Dacron patch angioplasty  . CATARACT EXTRACTION  1998 bilateral  done 3 months apart  . EYE SURGERY    . JOINT REPLACEMENT Right 11-18-12  . KNEE BURSECTOMY  11/18/2012   Procedure: KNEE BURSECTOMY;  Surgeon: Tobi Bastos, MD;  Location: [facility];  Service: Orthopedics;  Laterality: Right;  . NM MYOCAR MULTIPLE W/SPECT  10/08/2012   NORMAL STRESS NUCLEAR STUDY.  . TOTAL KNEE ARTHROPLASTY  11/18/2012   Procedure: TOTAL KNEE ARTHROPLASTY;  Surgeon: Tobi Bastos, MD;  Location: [facility];   Service: Orthopedics;  Laterality: Right;  . TRANSTHORACIC ECHOCARDIOGRAM  10/08/2012   MODERATE LVH. MODERATE CONCENTRIC HYPERTROPHY.EF 65 TO 70%. GRADE 1 DYSTOLIC DYSFUNCTION. ELEVATED VENTRICULAR END-DIASTOLIC FILLING PRESSURE AND LEFT ATRIAL FILLING PRESSURE. AV- MILD TO MODERATE CALCIFIED ANNULUS. MV- CALCIFIC DEGENERATION.Marland Kitchen LA-MILDLY DILATED.     A IV Location/Drains/Wounds Patient Lines/Drains/Airways Status   Active Line/Drains/Airways    Name:   Placement date:   Placement time:   Site:   Days:   Peripheral IV 06/06/19 Left;Distal Forearm   06/06/19    0058    Forearm   less than 1   Peripheral IV 06/06/19 Right Antecubital   06/06/19    0111    Antecubital   less than 1   Wound / Incision (Open or Dehisced) 04/30/19 Other (Comment) Elbow Left;Posterior skin tear above left elbow on back of arm.   04/30/19    0916    Elbow   37   Wound / Incision (Open or Dehisced) 04/30/19 Other (Comment);Non-pressure wound Arm Right skin tear just below elbow   04/30/19    0924    Arm   37   Wound / Incision (Open or Dehisced) 04/30/19 Other (Comment) Arm Right;Posterior skin tear just above elbow   04/30/19    0928    Arm   37   Wound / Incision (Open or Dehisced) 04/30/19 Laceration Head Posterior laceration- 3 staples done by EDP  04/30/19    1023    Head   37          Intake/Output Last 24 hours No intake or output data in the 24 hours ending 06/06/19 0233  Labs/Imaging Results for orders placed or performed during the hospital encounter of 06/06/19 (from the past 48 hour(s))  CBC with Differential/Platelet     Status: Abnormal   Collection Time: 06/06/19 12:43 AM  Result Value Ref Range   WBC 13.5 (H) 4.0 - 10.5 K/uL   RBC 4.29 4.22 - 5.81 MIL/uL   Hemoglobin 13.6 13.0 - 17.0 g/dL   HCT 41.4 39.0 - 52.0 %   MCV 96.5 80.0 - 100.0 fL   MCH 31.7 26.0 - 34.0 pg   MCHC 32.9 30.0 - 36.0 g/dL   RDW 13.6 11.5 - 15.5 %   Platelets 230 150 - 400 K/uL   nRBC 0.0 0.0 - 0.2 %   Neutrophils  Relative % 88 %   Neutro Abs 11.8 (H) 1.7 - 7.7 K/uL   Lymphocytes Relative 6 %   Lymphs Abs 0.8 0.7 - 4.0 K/uL   Monocytes Relative 6 %   Monocytes Absolute 0.7 0.1 - 1.0 K/uL   Eosinophils Relative 0 %   Eosinophils Absolute 0.0 0.0 - 0.5 K/uL   Basophils Relative 0 %   Basophils Absolute 0.0 0.0 - 0.1 K/uL   Immature Granulocytes 0 %   Abs Immature Granulocytes 0.05 0.00 - 0.07 K/uL    Comment: Performed at [facility], [address] [place], [place] [address]  Urinalysis, Routine w reflex microscopic     Status: Abnormal   Collection Time: 06/06/19 12:43 AM  Result Value Ref Range   Color, Urine YELLOW YELLOW   APPearance CLEAR CLEAR   Specific Gravity, Urine >1.030 (H) 1.005 - 1.030   pH 6.5 5.0 - 8.0   Glucose, UA >=500 (A) NEGATIVE mg/dL   Hgb urine dipstick TRACE (A) NEGATIVE   Bilirubin Urine NEGATIVE NEGATIVE   Ketones, ur 40 (A) NEGATIVE mg/dL   Protein, ur >300 (A) NEGATIVE mg/dL   Nitrite NEGATIVE NEGATIVE   Leukocytes,Ua NEGATIVE NEGATIVE    Comment: Performed at [facility], [address] [place], [place] [address]  Urinalysis, Microscopic (reflex)     Status: None   Collection Time: 06/06/19 12:43 AM  Result Value Ref Range   RBC / HPF 0-5 0 - 5 RBC/hpf   WBC, UA 0-5 0 - 5 WBC/hpf   Bacteria, UA NONE SEEN NONE SEEN   Squamous Epithelial / LPF NONE SEEN 0 - 5   Granular Casts, UA PRESENT     Comment: Performed at [facility], [address] [place], [place] [address]  Comprehensive metabolic panel     Status: Abnormal   Collection Time: 06/06/19  1:08 AM  Result Value Ref Range   Sodium 137 135 - 145 mmol/L   Potassium 3.8 3.5 - 5.1 mmol/L   Chloride 97 (L) 98 - 111 mmol/L   CO2 24 22 - 32 mmol/L   Glucose, Bld 215 (H) 70 - 99 mg/dL   BUN 15 8 - 23 mg/dL   Creatinine, Ser 0.94 0.61 - 1.24 mg/dL   Calcium 9.8 8.9 - 10.3 mg/dL   Total Protein 7.5 6.5 - 8.1 g/dL   Albumin 4.0 3.5 - 5.0 g/dL   AST 15 15 - 41 U/L    ALT 13 0 - 44 U/L  Alkaline Phosphatase 91 38 - 126 U/L   Total Bilirubin 1.1 0.3 - 1.2 mg/dL   GFR calc non Af Amer >60 >60 mL/min   GFR calc Af Amer >60 >60 mL/min   Anion gap 16 (H) 5 - 15    Comment: Performed at [facility], [address] [place], [place] [address]  Lipase, blood     Status: None   Collection Time: 06/06/19  1:08 AM  Result Value Ref Range   Lipase 21 11 - 51 U/L    Comment: Performed at [facility], [address] [place], [place] [address]   Ct Angio Abd/pel W And/or Wo Contrast  Result Date: 06/06/2019 CLINICAL DATA:  85 year old male with abdominal pain and vomiting. EXAM: CTA ABDOMEN AND PELVIS wITHOUT AND WITH CONTRAST TECHNIQUE: Multidetector CT imaging of the abdomen and pelvis was performed using the standard protocol during bolus administration of intravenous contrast. Multiplanar reconstructed images and MIPs were obtained and reviewed to evaluate the vascular anatomy. CONTRAST:  124mL OMNIPAQUE IOHEXOL 350 MG/ML SOLN COMPARISON:  None. FINDINGS: VASCULAR Aorta: There is advanced atherosclerotic calcification. No aneurysmal dilatation or dissection. Celiac: Patent without evidence of aneurysm, dissection, vasculitis or significant stenosis. SMA: Patent without evidence of aneurysm, dissection, vasculitis or significant stenosis. Renals: Atherosclerotic calcification of the renal ostia. The renal arteries are patent. IMA: Patent without evidence of aneurysm, dissection, vasculitis or significant stenosis. Inflow: Patent without evidence of aneurysm, dissection, vasculitis or significant stenosis. Atherosclerotic calcification. Proximal Outflow: Bilateral common femoral and visualized portions of the superficial and profunda femoral arteries are patent without evidence of aneurysm, dissection, vasculitis or significant stenosis. There is atherosclerotic calcification. Veins: No obvious venous abnormality within the limitations of this arterial  phase study. Review of the MIP images confirms the above findings. NON-VASCULAR Lower chest: The visualized lung bases are clear. There is coronary vascular calcification. No intra-abdominal free air or free fluid. Hepatobiliary: Diffuse fatty infiltration of the liver. No intrahepatic biliary ductal dilatation. Gallbladder is mildly distended. Faint focus of high attenuation in the gallbladder neck may represent a vessel although a small stone in the gall bladder neck is not excluded. No pericholecystic fluid. There is however slight apparent hyperemia gallbladder wall. Correlation with ultrasound is recommended Pancreas: The pancreas is moderately atrophic. There is a 13 x 13 mm hypodense lesion in the body of the pancreas which is not characterized but may represent a side branch IPMN. Further evaluation with MRI on a nonemergent basis recommended. No active inflammatory changes. Spleen: Normal in size without focal abnormality. Adrenals/Urinary Tract: Mild left adrenal thickening. The right adrenal gland is unremarkable. Several scattered punctate nonobstructing bilateral renal calculi versus renal vascular calcification. No hydronephrosis. Subcentimeter left renal upper pole hypodensity is not characterized. The visualized ureters and urinary bladder appear unremarkable. Stomach/Bowel: There is severe sigmoid and diffuse colonic diverticulosis without active inflammatory changes. There is no bowel obstruction or active inflammation. The appendix is normal. Lymphatic: No adenopathy. Reproductive: The prostate and seminal vesicles are grossly unremarkable. Other: There is an area of skin thickening and subcutaneous induration of the upper anterior abdominal wall. No fluid collection. Musculoskeletal: Osteopenia with degenerative changes of the spine. No acute osseous pathology. IMPRESSION: 1. Mildly distended gallbladder with apparent hyperemia of the gallbladder wall. Correlation with ultrasound recommended to  exclude acute cholecystitis. 2. Fatty liver. 3. Severe colonic diverticulosis. No bowel obstruction or active inflammation. Normal appendix. 4. A 13 x 13 mm hypodense lesion in the body  of the pancreas may represent a side branch IPMN. Further evaluation with MRI on a nonemergent basis recommended. 5. No abdominal aortic aneurysm dissection. Aortic Atherosclerosis (ICD10-I70.0). Electronically Signed   By: Anner Crete M.D.   On: 06/06/2019 02:16    Pending Labs Unresulted Labs (From admission, onward)    Start     Ordered   06/06/19 0232  SARS CORONAVIRUS 2 Nasal Swab Aptima Multi Swab  (Asymptomatic/Tier 2 Patients Labs)  Once,   STAT    Question Answer Comment  Is this test for diagnosis or screening Screening   Symptomatic for COVID-19 as defined by CDC No   Hospitalized for COVID-19 No   Admitted to ICU for COVID-19 No   Previously tested for COVID-19 No   Resident in a congregate (group) care setting No   Employed in healthcare setting No      06/06/19 0231          Vitals/Pain Today's Vitals   06/06/19 0112 06/06/19 0130 06/06/19 0211 06/06/19 0216  BP: (!) 179/74 (!) 163/80    Pulse: 82 88    Resp: 20 17    Temp:      TempSrc:      SpO2: 94% 97%    Weight:      Height:      PainSc:   4  3     Isolation Precautions Airborne and Contact precautions  Medications Medications  fentaNYL (SUBLIMAZE) injection 50 mcg (50 mcg Intravenous Given 06/06/19 0051)  pantoprazole (PROTONIX) injection 40 mg (40 mg Intravenous Given 06/06/19 0045)  iohexol (OMNIPAQUE) 350 MG/ML injection 100 mL (100 mLs Intravenous Contrast Given 06/06/19 0140)    Mobility walks with device Moderate fall risk   Focused Assessments Cardiac Assessment Handoff:    No results found for: CKTOTAL, CKMB, CKMBINDEX, TROPONINI No results found for: DDIMER Does the Patient currently have chest pain? No      R Recommendations: See Admitting Provider Note  Report given to:   Additional  Notes: Patient is very [place].  He has in bilateral hearing aids.  Wife went home.  Would like a call on dispo.  [name] Hands-336 [phone] (home)  [phone] (cell)

## 2019-06-06 NOTE — ED Notes (Signed)
Report given to Raub from [place]

## 2019-06-06 NOTE — ED Provider Notes (Signed)
D/w dr Lorin [facility] with [facility] for admission    Ripley Fraise, MD 06/06/19 [phone]

## 2019-06-06 NOTE — ED Triage Notes (Signed)
Pt coming by [facility] for [place] after being transferred from [place]

## 2019-06-06 NOTE — ED Triage Notes (Signed)
Pt BIB [facility] for abdominal pain since around 1100 that has gotten worse. Has been vomiitng. Was given 4mg  zofran

## 2019-06-06 NOTE — ED Provider Notes (Signed)
I assumed care in signout from ED provider [place] Pt here to have RUQ [place] due to possible cholecystitis Also found to have new onset afib Also noted to have pancreatic mass that will need f/u   Ripley Fraise, MD 06/06/19 [phone]

## 2019-06-06 NOTE — ED Provider Notes (Signed)
PT Found to have cholelithiasis without cholecystitis.  He is having continued abdominal pain and nausea.  Will admit to medicine.  He also has new onset atrial fibrillation.  Patient also has pancreatic mass that will need to be monitored.  This patients CHA2DS2-VASc Score and unadjusted Ischemic Stroke Rate (% per year) is equal to 4.8 % stroke rate/year from a score of 4  Above score calculated as 1 point each if present [CHF, HTN, DM, Vascular=MI/PAD/Aortic Plaque, Age if 65-74, or Male] Above score calculated as 2 points each if present [Age > 75, or Stroke/TIA/TE]     Ripley Fraise, MD 06/06/19 [phone]

## 2019-06-06 NOTE — H&P (Signed)
History and Physical    [name] HMC:[id number] DOB: 11/14/1929 DOA: 06/06/2019  PCP: [name], MD Consultants:  [place] - orthopedics; [place] Patient coming from:  Home - lives with wife of 17 years; NOK: Hansel Feinstein [place], [place]  Chief Complaint: abdominal pain  HPI: [name] is a 85 y.o. male with medical history significant of HTN; HLD; and DM presenting as transfer from [facility] [place] for n/v.  He developed abdominal pain yesterday before lunch.  It worsened and he developed n/v.  Epigastric TTP.  His last emesis was last night about 11PM.  He is still having pain.  He had a normal BM yesterday AM.  He does not have chest pain, no palpitations.  Denies known h/o afib.  I spoke with his wife.  He had knee replacement in 2014.  Dr. Gwenlyn Found did a pre-op examination and he did have afib at that time.  He is taking Diltiazem.   His wife has afib and was previously on Plavix but now has a pacer.  He has never been on [facility].  His wife requests that I discuss AC with their RN daughter, Festus Pursel, teaches nursing at [employer], [phone].   He has a h/o gastritis and pancreatitis.  He did a bleeding ulcer in 1978.  The last time he fell, he hit his head hard and hard a big gash on his head.  He felt like his hip gave out, which has happened before.  She would like to discuss this further with her parents and siblings and will let us know.   ED Course:  CT with ?cholecystitis.  Negative by RUQ, but he does have cholelithiasis.  New afib with rate control.    Review of Systems: As per HPI; otherwise review of systems reviewed and negative.   Ambulatory Status:  Ambulates with a walker since his last fall  Past Medical History:  Diagnosis Date   Anemia    related to frequent nosebleeds-right nostril.   Arthritis    arthritis -back knees, most joints   Breathing difficulty 11-10-12   difficult to breath if laying posteriorly, right nare difficulty   Carotid artery occlusion     Change in hearing    Diabetes mellitus age 68   Dysrhythmia 07/2012   hx. Atrial Flutter x1, converted spontaneously-no ploblems.    H/O hiatal hernia    HOH (hard of hearing) 11-10-12   bilaterally   Hyperlipidemia    Hypertension    Ulcer 1995   bleeding ulcer, none since    Past Surgical History:  Procedure Laterality Date   CAROTID DUPLEX  06/30/2012   PATENT RIGHT CAROTID ENDARTERECTOMY. 1%-39% LEFT ICA. STABLE   CAROTID ENDARTERECTOMY  11/242008   right with Dacron patch angioplasty   CATARACT EXTRACTION  1998 bilateral  done 3 months apart   EYE SURGERY     JOINT REPLACEMENT Right 11-18-12   KNEE BURSECTOMY  11/18/2012   Procedure: KNEE BURSECTOMY;  Surgeon: Tobi Bastos, MD;  Location: [facility];  Service: Orthopedics;  Laterality: Right;   NM MYOCAR MULTIPLE W/SPECT  10/08/2012   NORMAL STRESS NUCLEAR STUDY.   TOTAL KNEE ARTHROPLASTY  11/18/2012   Procedure: TOTAL KNEE ARTHROPLASTY;  Surgeon: Tobi Bastos, MD;  Location: [facility];  Service: Orthopedics;  Laterality: Right;   TRANSTHORACIC ECHOCARDIOGRAM  10/08/2012   MODERATE LVH. MODERATE CONCENTRIC HYPERTROPHY.EF 65 TO 70%. GRADE 1 DYSTOLIC DYSFUNCTION. ELEVATED VENTRICULAR END-DIASTOLIC FILLING PRESSURE AND LEFT ATRIAL FILLING PRESSURE. AV- MILD TO MODERATE CALCIFIED  ANNULUS. MV- CALCIFIC DEGENERATION.Marland Kitchen LA-MILDLY DILATED.    Social History   Socioeconomic History   Marital status: Married    Spouse name: Not on file   Number of children: Not on file   Years of education: Not on file   Highest education level: Not on file  Occupational History   Occupation: retired  Scientist, product/process development strain: Not on file   Food insecurity    Worry: Not on file    Inability: Not on file   Transportation needs    Medical: Not on file    Non-medical: Not on file  Tobacco Use   Smoking status: Former Smoker    Types: Cigarettes    Quit date: 10/21/1976    Years since quitting: 42.6    Smokeless tobacco: Never Used  Substance and Sexual Activity   Alcohol use: Yes    Alcohol/week: 1.0 standard drinks    Types: 1 Glasses of wine per week    Comment: 1-2 alcoholic drinks daily   Drug use: No   Sexual activity: Not on file  Lifestyle   Physical activity    Days per week: Not on file    Minutes per session: Not on file   Stress: Not on file  Relationships   Social connections    Talks on phone: Not on file    Gets together: Not on file    Attends religious service: Not on file    Active member of club or organization: Not on file    Attends meetings of clubs or organizations: Not on file    Relationship status: Not on file   Intimate partner violence    Fear of current or ex partner: Not on file    Emotionally abused: Not on file    Physically abused: Not on file    Forced sexual activity: Not on file  Other Topics Concern   Not on file  Social History Narrative   Not on file    No Known Allergies  Family History  Problem Relation Age of Onset   Cancer Mother        Pancreatic   Heart disease Mother    Cancer Brother        pancreactic   Cancer Daughter        Breast    Prior to Admission medications   Medication Sig Start Date End Date Taking? Authorizing Provider  diltiazem (DILACOR XR) 180 MG 24 hr capsule Take 180 mg by mouth daily.   Yes [provider]  gabapentin (NEURONTIN) 100 MG capsule Take 100 mg by mouth 3 (three) times daily. 2 in the morning and 4 at night   Yes [provider]  gemfibrozil (LOPID) 600 MG tablet Take 600 mg by mouth 2 (two) times daily before a meal.   Yes [provider]  hydrochlorothiazide (HYDRODIURIL) 25 MG tablet Take 25 mg by mouth daily.   Yes [provider]  omeprazole (PRILOSEC) 20 MG capsule Take 20 mg by mouth daily.   Yes [provider]  acetaminophen (TYLENOL) 500 MG tablet Take 500 mg by mouth every 6 (six) hours as needed. Pain    [provider]  atorvastatin (LIPITOR) 80 MG tablet Take 40 mg by mouth every evening. Takes 1/2    [provider]  Calcium-Vitamin D (CALTRATE 600 PLUS-VIT D PO) Take by mouth daily.    [provider]  Cholecalciferol (VITAMIN D3) 2000 UNITS TABS Take by mouth  daily.    [provider]  diltiazem (CARDIZEM CD) 120 MG 24 hr capsule Take 120 mg by mouth daily. 10/29/13   [provider]  hydrochlorothiazide (HYDRODIURIL) 25 MG tablet Take 12.5 mg by mouth daily. 09/24/13   [provider]  insulin detemir (LEVEMIR) 100 UNIT/ML injection Inject 10-30 Units into the skin 2 (two) times daily. Takes 40 units in the morning and 10 units at night    [provider]  losartan (COZAAR) 100 MG tablet Take 100 mg by mouth daily before breakfast.     [provider]  metFORMIN (GLUCOPHAGE) 1000 MG tablet Take 1,000 mg by mouth 2 (two) times daily with a meal.    [provider]  traZODone (DESYREL) 100 MG tablet Take 100 mg by mouth at bedtime.      [provider]    Physical Exam: Vitals:   06/06/19 1045 06/06/19 1100 06/06/19 1125 06/06/19 1200  BP: (!) 162/79 (!) 165/81 (!) 168/85 (!) 155/77  Pulse: 88 91 93 95  Resp: 18 16 15 16   Temp:      TempSrc:      SpO2: 92% 94% 91% 95%  Weight:      Height:          General:  Appears calm and comfortable and is NAD  Eyes:  PERRL, EOMI, normal lids, iris  ENT:  profoundly hard of hearing, normal lips & tongue, mildly dry mm  Neck:  no LAD, masses or thyromegaly  Cardiovascular:  Irregularly irregular but rate controlled, no m/r/g. No LE edema.   Respiratory:   CTA bilaterally with no wheezes/rales/rhonchi.  Normal respiratory effort.  Abdomen:  soft, mild diffuse TTP, NT, ND, NABS  Skin:  no rash or induration seen on limited exam  Musculoskeletal:  grossly normal tone BUE/BLE, good ROM, no bony abnormality  Psychiatric:  grossly normal mood and affect, speech  fluent and appropriate, AOx3  Neurologic:  CN 2-12 grossly intact, moves all extremities in coordinated fashion    Radiological Exams on Admission: Ct Angio Abd/pel W And/or Wo Contrast  Result Date: 06/06/2019 CLINICAL DATA:  85 year old male with abdominal pain and vomiting. EXAM: CTA ABDOMEN AND PELVIS wITHOUT AND WITH CONTRAST TECHNIQUE: Multidetector CT imaging of the abdomen and pelvis was performed using the standard protocol during bolus administration of intravenous contrast. Multiplanar reconstructed images and MIPs were obtained and reviewed to evaluate the vascular anatomy. CONTRAST:  142mL OMNIPAQUE IOHEXOL 350 MG/ML SOLN COMPARISON:  None. FINDINGS: VASCULAR Aorta: There is advanced atherosclerotic calcification. No aneurysmal dilatation or dissection. Celiac: Patent without evidence of aneurysm, dissection, vasculitis or significant stenosis. SMA: Patent without evidence of aneurysm, dissection, vasculitis or significant stenosis. Renals: Atherosclerotic calcification of the renal ostia. The renal arteries are patent. IMA: Patent without evidence of aneurysm, dissection, vasculitis or significant stenosis. Inflow: Patent without evidence of aneurysm, dissection, vasculitis or significant stenosis. Atherosclerotic calcification. Proximal Outflow: Bilateral common femoral and visualized portions of the superficial and profunda femoral arteries are patent without evidence of aneurysm, dissection, vasculitis or significant stenosis. There is atherosclerotic calcification. Veins: No obvious venous abnormality within the limitations of this arterial phase study. Review of the MIP images confirms the above findings. NON-VASCULAR Lower chest: The visualized lung bases are clear. There is coronary vascular calcification. No intra-abdominal free air or free fluid. Hepatobiliary: Diffuse fatty infiltration of the liver. No intrahepatic biliary ductal dilatation. Gallbladder is mildly distended. Faint  focus of high attenuation in the gallbladder neck may represent  a vessel although a small stone in the gall bladder neck is not excluded. No pericholecystic fluid. There is however slight apparent hyperemia gallbladder wall. Correlation with ultrasound is recommended Pancreas: The pancreas is moderately atrophic. There is a 13 x 13 mm hypodense lesion in the body of the pancreas which is not characterized but may represent a side branch IPMN. Further evaluation with MRI on a nonemergent basis recommended. No active inflammatory changes. Spleen: Normal in size without focal abnormality. Adrenals/Urinary Tract: Mild left adrenal thickening. The right adrenal gland is unremarkable. Several scattered punctate nonobstructing bilateral renal calculi versus renal vascular calcification. No hydronephrosis. Subcentimeter left renal upper pole hypodensity is not characterized. The visualized ureters and urinary bladder appear unremarkable. Stomach/Bowel: There is severe sigmoid and diffuse colonic diverticulosis without active inflammatory changes. There is no bowel obstruction or active inflammation. The appendix is normal. Lymphatic: No adenopathy. Reproductive: The prostate and seminal vesicles are grossly unremarkable. Other: There is an area of skin thickening and subcutaneous induration of the upper anterior abdominal wall. No fluid collection. Musculoskeletal: Osteopenia with degenerative changes of the spine. No acute osseous pathology. IMPRESSION: 1. Mildly distended gallbladder with apparent hyperemia of the gallbladder wall. Correlation with ultrasound recommended to exclude acute cholecystitis. 2. Fatty liver. 3. Severe colonic diverticulosis. No bowel obstruction or active inflammation. Normal appendix. 4. A 13 x 13 mm hypodense lesion in the body of the pancreas may represent a side branch IPMN. Further evaluation with MRI on a nonemergent basis recommended. 5. No abdominal aortic aneurysm dissection. Aortic  Atherosclerosis (ICD10-I70.0). Electronically Signed   By: Anner Crete M.D.   On: 06/06/2019 02:16   US Abdomen Limited Ruq  Result Date: 06/06/2019 CLINICAL DATA:  Right upper quadrant pain for 2 days. EXAM: ULTRASOUND ABDOMEN LIMITED RIGHT UPPER QUADRANT COMPARISON:  None. FINDINGS: Gallbladder: A tiny mobile gallstone is seen measuring approximately 5 mm. No evidence of gallbladder wall thickening or pericholecystic fluid. No sonographic Murphy sign noted by sonographer. Common bile duct: Diameter: 2 mm, within normal limits. Liver: No focal lesion identified. Within normal limits in parenchymal echogenicity. Portal vein is patent on color Doppler imaging with normal direction of blood flow towards the liver. Other: None. IMPRESSION: Tiny gallstone. No sonographic evidence of cholecystitis or biliary ductal dilatation. Electronically Signed   By: Marlaine Hind M.D.   On: 06/06/2019 05:21    EKG: Independently reviewed.  Afib with rate 95; nonspecific ST changes with no evidence of acute ischemia   Labs on Admission: I have personally reviewed the available labs and imaging studies at the time of the admission.  Pertinent labs:   Glucose 215 Anion gap 16 WBC 13.5 UA: >500 glucose, trace Hgb, 40 ketones, >300 protein, SG >1.030 COVID pending   Assessment/Plan Principal Problem:   Abdominal pain Active Problems:   Essential hypertension   Hyperlipidemia   Diabetes (HCC)   AF (paroxysmal atrial fibrillation) (HCC)   Dehydration, mild   Abdominal pain -Patient with remote h/o gastritis and ulcer presenting with abdominal pain, n/v -His vomiting has resolved at this time -Initial CT was concerning for cholecystitis, but RUQ shows no ductal dilation and only a "tiny" gallstone -While this might be related to cholelithiasis, it seems unlikely at this time -He did receive one dose of Zosyn, but due to low suspicion for infection will not continue at this time -This appears more  likely related to gastritis/ulcer -Will order Protonix IV BID and Carafate -Clear liquids -Overnight observation -If improved, likely appropriate for  d/c to home tomorrow -If not improved, consider GI consultation  PAF -The patient reports one prior episode of aflutter/afib for which he saw Dr. Gwenlyn Found -He was never on [facility] -He was noted to be in afib today and remains in afib -He is rate controlled on Diltiazem, will continue -Will monitor on telemetry -I had a long discussion with first his wife and then his (Therapist, sports) daughter by telephone; we carefully discussed risks and benefits of AC.  He is having falls and his bleeding risk is not insignificant, particularly given his prior h/o bleeding ulcer.   -For now, will start ASA 81 mg (increase to 325 once Lovenox prophylaxis is stopped?). -Family will continue to discuss to decide whether to start [facility] now; decline AC now; or continue discussion with PCP/family/cardiology as an outpatient.  Dehydration -Persistent n/v prior to admission led to dehydration. -Patient with 40 ketones on UA. -Will gently hydrate overnight and continue to follow. -Repeat BMP in AM - normal today other than mildly elevated anion gap.  HTN -Continue Diltiazem, Cozaar  HLD -Continue Lipitor -Hold Lopid - unable to have significant benefit while hospitalized.  DM -Hold Glucophage -Will cover with moderate-scale-scale SSI without qhs coverage    Note: This patient has been tested and is pending for the novel coronavirus COVID-19.  DVT prophylaxis:  Lovenox  Code Status:  DNR - confirmed with patient/family Family Communication: None present but I spoke extensively by telephone with his wife and daughter Disposition Plan:  Home once clinically improved Consults called: PT/OT Admission status: It is my clinical opinion that referral for OBSERVATION is reasonable and necessary in this patient based on the above information provided. The aforementioned taken  together are felt to place the patient at high risk for further clinical deterioration. However it is anticipated that the patient may be medically stable for discharge from the hospital within 24 to 48 hours.    Karmen Bongo MD [facility]   How to contact the [facility] Attending or Consulting provider [place] or covering provider during after hours [place], for this patient?  1. Check the care team in [facility] and look for a) attending/consulting [facility] provider listed and b) the [facility] team listed 2. Log into [email or web address] and use [place]'s universal password to access. If you do not have the password, please contact the hospital operator. 3. Locate the [facility] provider you are looking for under [facility] and page to a number that you can be directly reached. 4. If you still have difficulty reaching the provider, please page the [facility] (Director on Call) for the Hospitalists listed on amion for assistance.   06/06/2019, 12:34 PM

## 2019-06-06 NOTE — ED Notes (Signed)
ED TO INPATIENT HANDOFF REPORT  ED Nurse Name and Phone #: Harriette Bouillon [phone]  S Name/Age/Gender [name] 85 y.o. male Room/Bed: 013C/013C  Code Status   Code Status: DNR  Home/SNF/Other Home Patient oriented to: self, place, time and situation Is this baseline? Yes   Triage Complete: Triage complete  Chief Complaint abd pain  Triage Note Pt BIB [facility] for abdominal pain since around 1100 that has gotten worse. Has been vomiitng. Was given 4mg  zofran  Pt coming by [facility] for [place] after being transferred from [place] No Known Allergies  Level of Care/Admitting Diagnosis ED Disposition    ED Disposition Condition [place]: [place] [[id number]]  Level of Care: Telemetry Medical [104]  I expect the patient will be discharged within 24 hours: Yes  LOW acuity---Tx typically complete <24 hrs---ACUTE conditions typically can be evaluated <24 hours---LABS likely to return to acceptable levels <24 hours---IS near functional baseline---EXPECTED to return to current living arrangement---NOT newly hypoxic: Meets criteria for 5C-Observation unit  Covid Evaluation: Asymptomatic Screening Protocol (No Symptoms)  Diagnosis: Abdominal pain [875643]  Admitting Physician: Karmen Bongo [[id number]]  Attending Physician: Karmen Bongo [[id number]]  PT Class (Do Not Modify): Observation [104]  PT Acc Code (Do Not Modify): Observation [10022]       B Medical/Surgery History Past Medical History:  Diagnosis Date  . Anemia    related to frequent nosebleeds-right nostril.  . Arthritis    arthritis -back knees, most joints  . Breathing difficulty 11-10-12   difficult to breath if laying posteriorly, right nare difficulty  . Carotid artery occlusion   . Change in hearing   . Diabetes mellitus age 43  . Dysrhythmia 07/2012   hx. Atrial Flutter x1, converted spontaneously-no ploblems.   . H/O hiatal hernia   . HOH (hard of hearing) 11-10-12    bilaterally  . Hyperlipidemia   . Hypertension   . Ulcer 1995   bleeding ulcer, none since   Past Surgical History:  Procedure Laterality Date  . CAROTID DUPLEX  06/30/2012   PATENT RIGHT CAROTID ENDARTERECTOMY. 1%-39% LEFT ICA. STABLE  . CAROTID ENDARTERECTOMY  11/242008   right with Dacron patch angioplasty  . CATARACT EXTRACTION  1998 bilateral  done 3 months apart  . EYE SURGERY    . JOINT REPLACEMENT Right 11-18-12  . KNEE BURSECTOMY  11/18/2012   Procedure: KNEE BURSECTOMY;  Surgeon: Tobi Bastos, MD;  Location: [facility];  Service: Orthopedics;  Laterality: Right;  . NM MYOCAR MULTIPLE W/SPECT  10/08/2012   NORMAL STRESS NUCLEAR STUDY.  . TOTAL KNEE ARTHROPLASTY  11/18/2012   Procedure: TOTAL KNEE ARTHROPLASTY;  Surgeon: Tobi Bastos, MD;  Location: [facility];  Service: Orthopedics;  Laterality: Right;  . TRANSTHORACIC ECHOCARDIOGRAM  10/08/2012   MODERATE LVH. MODERATE CONCENTRIC HYPERTROPHY.EF 65 TO 70%. GRADE 1 DYSTOLIC DYSFUNCTION. ELEVATED VENTRICULAR END-DIASTOLIC FILLING PRESSURE AND LEFT ATRIAL FILLING PRESSURE. AV- MILD TO MODERATE CALCIFIED ANNULUS. MV- CALCIFIC DEGENERATION.Marland Kitchen LA-MILDLY DILATED.     A IV Location/Drains/Wounds Patient Lines/Drains/Airways Status   Active Line/Drains/Airways    Name:   Placement date:   Placement time:   Site:   Days:   Peripheral IV 06/06/19 Left;Distal Forearm   06/06/19    0058    Forearm   less than 1   Peripheral IV 06/06/19 Right Antecubital   06/06/19    0111    Antecubital   less than 1   Wound /  Incision (Open or Dehisced) 04/30/19 Other (Comment) Elbow Left;Posterior skin tear above left elbow on back of arm.   04/30/19    0916    Elbow   37   Wound / Incision (Open or Dehisced) 04/30/19 Other (Comment);Non-pressure wound Arm Right skin tear just below elbow   04/30/19    0924    Arm   37   Wound / Incision (Open or Dehisced) 04/30/19 Other (Comment) Arm Right;Posterior skin tear just above elbow   04/30/19    0928    Arm    37   Wound / Incision (Open or Dehisced) 04/30/19 Laceration Head Posterior laceration- 3 staples done by EDP   04/30/19    1023    Head   37          Intake/Output Last 24 hours  Intake/Output Summary (Last 24 hours) at 06/06/2019 1409 Last data filed at 06/06/2019 0934 Gross per 24 hour  Intake -  Output 350 ml  Net -350 ml    Labs/Imaging Results for orders placed or performed during the hospital encounter of 06/06/19 (from the past 48 hour(s))  CBC with Differential/Platelet     Status: Abnormal   Collection Time: 06/06/19 12:43 AM  Result Value Ref Range   WBC 13.5 (H) 4.0 - 10.5 K/uL   RBC 4.29 4.22 - 5.81 MIL/uL   Hemoglobin 13.6 13.0 - 17.0 g/dL   HCT 41.4 39.0 - 52.0 %   MCV 96.5 80.0 - 100.0 fL   MCH 31.7 26.0 - 34.0 pg   MCHC 32.9 30.0 - 36.0 g/dL   RDW 13.6 11.5 - 15.5 %   Platelets 230 150 - 400 K/uL   nRBC 0.0 0.0 - 0.2 %   Neutrophils Relative % 88 %   Neutro Abs 11.8 (H) 1.7 - 7.7 K/uL   Lymphocytes Relative 6 %   Lymphs Abs 0.8 0.7 - 4.0 K/uL   Monocytes Relative 6 %   Monocytes Absolute 0.7 0.1 - 1.0 K/uL   Eosinophils Relative 0 %   Eosinophils Absolute 0.0 0.0 - 0.5 K/uL   Basophils Relative 0 %   Basophils Absolute 0.0 0.0 - 0.1 K/uL   Immature Granulocytes 0 %   Abs Immature Granulocytes 0.05 0.00 - 0.07 K/uL    Comment: Performed at [facility], [address] [place], [place] [address]  Urinalysis, Routine w reflex microscopic     Status: Abnormal   Collection Time: 06/06/19 12:43 AM  Result Value Ref Range   Color, Urine YELLOW YELLOW   APPearance CLEAR CLEAR   Specific Gravity, Urine >1.030 (H) 1.005 - 1.030   pH 6.5 5.0 - 8.0   Glucose, UA >=500 (A) NEGATIVE mg/dL   Hgb urine dipstick TRACE (A) NEGATIVE   Bilirubin Urine NEGATIVE NEGATIVE   Ketones, ur 40 (A) NEGATIVE mg/dL   Protein, ur >300 (A) NEGATIVE mg/dL   Nitrite NEGATIVE NEGATIVE   Leukocytes,Ua NEGATIVE NEGATIVE    Comment: Performed at [facility],  [address] [place], [place] [address]  Urinalysis, Microscopic (reflex)     Status: None   Collection Time: 06/06/19 12:43 AM  Result Value Ref Range   RBC / HPF 0-5 0 - 5 RBC/hpf   WBC, UA 0-5 0 - 5 WBC/hpf   Bacteria, UA NONE SEEN NONE SEEN   Squamous Epithelial / LPF NONE SEEN 0 - 5   Granular Casts, UA PRESENT     Comment: Performed at  [facility], [address] [place], [place] [address]  Comprehensive metabolic panel     Status: Abnormal   Collection Time: 06/06/19  1:08 AM  Result Value Ref Range   Sodium 137 135 - 145 mmol/L   Potassium 3.8 3.5 - 5.1 mmol/L   Chloride 97 (L) 98 - 111 mmol/L   CO2 24 22 - 32 mmol/L   Glucose, Bld 215 (H) 70 - 99 mg/dL   BUN 15 8 - 23 mg/dL   Creatinine, Ser 0.94 0.61 - 1.24 mg/dL   Calcium 9.8 8.9 - 10.3 mg/dL   Total Protein 7.5 6.5 - 8.1 g/dL   Albumin 4.0 3.5 - 5.0 g/dL   AST 15 15 - 41 U/L   ALT 13 0 - 44 U/L   Alkaline Phosphatase 91 38 - 126 U/L   Total Bilirubin 1.1 0.3 - 1.2 mg/dL   GFR calc non Af Amer >60 >60 mL/min   GFR calc Af Amer >60 >60 mL/min   Anion gap 16 (H) 5 - 15    Comment: Performed at [facility], [address] [place], [place] [address]  Lipase, blood     Status: None   Collection Time: 06/06/19  1:08 AM  Result Value Ref Range   Lipase 21 11 - 51 U/L    Comment: Performed at [facility], [address] [place], [place] [address]  CBG monitoring, ED     Status: Abnormal   Collection Time: 06/06/19 11:59 AM  Result Value Ref Range   Glucose-Capillary 190 (H) 70 - 99 mg/dL   Ct Angio Abd/pel W And/or Wo Contrast  Result Date: 06/06/2019 CLINICAL DATA:  85 year old male with abdominal pain and vomiting. EXAM: CTA ABDOMEN AND PELVIS wITHOUT AND WITH CONTRAST TECHNIQUE: Multidetector CT imaging of the abdomen and pelvis was performed using the standard protocol during bolus administration of intravenous contrast. Multiplanar reconstructed images and MIPs were  obtained and reviewed to evaluate the vascular anatomy. CONTRAST:  164mL OMNIPAQUE IOHEXOL 350 MG/ML SOLN COMPARISON:  None. FINDINGS: VASCULAR Aorta: There is advanced atherosclerotic calcification. No aneurysmal dilatation or dissection. Celiac: Patent without evidence of aneurysm, dissection, vasculitis or significant stenosis. SMA: Patent without evidence of aneurysm, dissection, vasculitis or significant stenosis. Renals: Atherosclerotic calcification of the renal ostia. The renal arteries are patent. IMA: Patent without evidence of aneurysm, dissection, vasculitis or significant stenosis. Inflow: Patent without evidence of aneurysm, dissection, vasculitis or significant stenosis. Atherosclerotic calcification. Proximal Outflow: Bilateral common femoral and visualized portions of the superficial and profunda femoral arteries are patent without evidence of aneurysm, dissection, vasculitis or significant stenosis. There is atherosclerotic calcification. Veins: No obvious venous abnormality within the limitations of this arterial phase study. Review of the MIP images confirms the above findings. NON-VASCULAR Lower chest: The visualized lung bases are clear. There is coronary vascular calcification. No intra-abdominal free air or free fluid. Hepatobiliary: Diffuse fatty infiltration of the liver. No intrahepatic biliary ductal dilatation. Gallbladder is mildly distended. Faint focus of high attenuation in the gallbladder neck may represent a vessel although a small stone in the gall bladder neck is not excluded. No pericholecystic fluid. There is however slight apparent hyperemia gallbladder wall. Correlation with ultrasound is recommended Pancreas: The pancreas is moderately atrophic. There is a 13 x 13 mm hypodense lesion in the body of the pancreas which is not characterized but may represent a side branch IPMN. Further evaluation with MRI on a nonemergent basis recommended.  No active inflammatory changes.  Spleen: Normal in size without focal abnormality. Adrenals/Urinary Tract: Mild left adrenal thickening. The right adrenal gland is unremarkable. Several scattered punctate nonobstructing bilateral renal calculi versus renal vascular calcification. No hydronephrosis. Subcentimeter left renal upper pole hypodensity is not characterized. The visualized ureters and urinary bladder appear unremarkable. Stomach/Bowel: There is severe sigmoid and diffuse colonic diverticulosis without active inflammatory changes. There is no bowel obstruction or active inflammation. The appendix is normal. Lymphatic: No adenopathy. Reproductive: The prostate and seminal vesicles are grossly unremarkable. Other: There is an area of skin thickening and subcutaneous induration of the upper anterior abdominal wall. No fluid collection. Musculoskeletal: Osteopenia with degenerative changes of the spine. No acute osseous pathology. IMPRESSION: 1. Mildly distended gallbladder with apparent hyperemia of the gallbladder wall. Correlation with ultrasound recommended to exclude acute cholecystitis. 2. Fatty liver. 3. Severe colonic diverticulosis. No bowel obstruction or active inflammation. Normal appendix. 4. A 13 x 13 mm hypodense lesion in the body of the pancreas may represent a side branch IPMN. Further evaluation with MRI on a nonemergent basis recommended. 5. No abdominal aortic aneurysm dissection. Aortic Atherosclerosis (ICD10-I70.0). Electronically Signed   By: Anner Crete M.D.   On: 06/06/2019 02:16   US Abdomen Limited Ruq  Result Date: 06/06/2019 CLINICAL DATA:  Right upper quadrant pain for 2 days. EXAM: ULTRASOUND ABDOMEN LIMITED RIGHT UPPER QUADRANT COMPARISON:  None. FINDINGS: Gallbladder: A tiny mobile gallstone is seen measuring approximately 5 mm. No evidence of gallbladder wall thickening or pericholecystic fluid. No sonographic Murphy sign noted by sonographer. Common bile duct: Diameter: 2 mm, within normal limits.  Liver: No focal lesion identified. Within normal limits in parenchymal echogenicity. Portal vein is patent on color Doppler imaging with normal direction of blood flow towards the liver. Other: None. IMPRESSION: Tiny gallstone. No sonographic evidence of cholecystitis or biliary ductal dilatation. Electronically Signed   By: Marlaine Hind M.D.   On: 06/06/2019 05:21    Pending Labs Unresulted Labs (From admission, onward)    Start     Ordered   06/07/19 2119  Basic metabolic panel  Tomorrow morning,   R     06/06/19 0859   06/07/19 0500  CBC  Tomorrow morning,   R     06/06/19 0859   06/06/19 0834  Hemoglobin A1c  Once,   STAT    Comments: To assess prior glycemic control    06/06/19 0859   06/06/19 0232  SARS CORONAVIRUS 2 Nasal Swab Aptima Multi Swab  (Asymptomatic/Tier 2 Patients Labs)  Once,   STAT    Question Answer Comment  Is this test for diagnosis or screening Screening   Symptomatic for COVID-19 as defined by CDC No   Hospitalized for COVID-19 No   Admitted to ICU for COVID-19 No   Previously tested for COVID-19 No   Resident in a congregate (group) care setting No   Employed in healthcare setting No      06/06/19 0231          Vitals/Pain Today's Vitals   06/06/19 1126 06/06/19 1200 06/06/19 1300 06/06/19 1320  BP:  (!) 155/77 (!) 169/80   Pulse:  95    Resp:  16 19   Temp:      TempSrc:      SpO2:  95%    Weight:      Height:      PainSc: 6    2     Isolation Precautions No active isolations  Medications  Medications  pantoprazole (PROTONIX) injection 40 mg (40 mg Intravenous Given 06/06/19 0927)  fentaNYL (SUBLIMAZE) injection 50 mcg (50 mcg Intravenous Given 06/06/19 1217)  atorvastatin (LIPITOR) tablet 40 mg (has no administration in time range)  diltiazem (CARDIZEM CD) 24 hr capsule 180 mg (has no administration in time range)  hydrochlorothiazide (HYDRODIURIL) tablet 25 mg (25 mg Oral Given 06/06/19 0934)  losartan (COZAAR) tablet 100 mg (100 mg Oral  Given 06/06/19 1049)  traZODone (DESYREL) tablet 100 mg (has no administration in time range)  gabapentin (NEURONTIN) capsule 100 mg (100 mg Oral Given 06/06/19 1217)  enoxaparin (LOVENOX) injection 40 mg (40 mg Subcutaneous Given 06/06/19 1216)  acetaminophen (TYLENOL) tablet 650 mg (has no administration in time range)    Or  acetaminophen (TYLENOL) suppository 650 mg (has no administration in time range)  docusate sodium (COLACE) capsule 100 mg (100 mg Oral Given 06/06/19 0934)  ondansetron (ZOFRAN) tablet 4 mg ( Oral See Alternative 06/06/19 1104)    Or  ondansetron (ZOFRAN) injection 4 mg (4 mg Intravenous Given 06/06/19 1104)  insulin aspart (novoLOG) injection 0-15 Units (3 Units Subcutaneous Given 06/06/19 1216)  aspirin EC tablet 81 mg (81 mg Oral Given 06/06/19 0934)  sodium chloride flush (NS) 0.9 % injection 3 mL (0 mLs Intravenous Hold 06/06/19 1356)  sucralfate (CARAFATE) tablet 1 g (has no administration in time range)  lactated ringers infusion ( Intravenous New Bag/Given 06/06/19 0931)  insulin detemir (LEVEMIR) injection 40 Units (40 Units Subcutaneous Given 06/06/19 1215)  insulin detemir (LEVEMIR) injection 10 Units (has no administration in time range)  pantoprazole (PROTONIX) injection 40 mg (40 mg Intravenous Given 06/06/19 0045)  iohexol (OMNIPAQUE) 350 MG/ML injection 100 mL (100 mLs Intravenous Contrast Given 06/06/19 0140)  piperacillin-tazobactam (ZOSYN) IVPB 3.375 g (0 g Intravenous Stopped 06/06/19 0817)    Mobility walks with person assist Moderate fall risk   Focused Assessments Pulmonary Assessment Handoff:  Lung sounds:   O2 Device: Room Air        R Recommendations: See Admitting Provider Note  Report given to:   Additional Notes:

## 2019-06-06 NOTE — ED Notes (Signed)
Pt transported to [facility]

## 2019-06-06 NOTE — ED Notes (Signed)
Pt waiting for carelink to transport pt to [facility]. Atrial Fib on the monitor. HR 94 VSS. No complaints at present.

## 2019-06-06 NOTE — Progress Notes (Signed)
New Admission Note:   Arrival Method: from ED via stretcher  Mental Orientation: A&Ox4 Telemetry: Box 20, CCMD notified Assessment: refer to flowsheet Skin: refer to flowsheet IV: LFA & RAC Pain: 4/10 Tubes: None Safety Measures: Safety Fall Prevention Plan has been discussed  Admission: to be completed 5 [place] Orientation: Patient has been orientated to the room, unit and staff.   Family: wife at bedside  Orders to be reviewed and implemented. Will continue to monitor the patient. Call light has been placed within reach and bed alarm has been activated.

## 2019-06-06 NOTE — ED Notes (Signed)
carelink here to transport pt to [facility]  

## 2019-06-06 NOTE — Evaluation (Signed)
Physical Therapy Evaluation Patient Details Name: [name] MRN: [id number] DOB: Feb 07, 1930 Today's Date: 06/06/2019   History of Present Illness  Patient is a 85 y/o male presenting with abdominal pain. Past medical history significant of HTN; HLD; and DM. Admitted for further work-up.     Clinical Impression  Patient admitted with the above. Patient reports Mod I with mobility prior to admission with use of RW. Patient today requiring Min A/min guard for functional mobility with consistent verbal cueing for safety with AD. Small shuffle steps noted - wife states this is at baseline. Of note, patient in AFIb throughout session with HR up to 159 - patient asymptomatic. Will recommend HHPT at discharge. PT to follow acutely.     Follow Up Recommendations Home health PT;Supervision/Assistance - 24 hour    Equipment Recommendations  None recommended by PT    Recommendations for Other Services       Precautions / Restrictions Precautions Precautions: Fall Restrictions Weight Bearing Restrictions: No      Mobility  Bed Mobility Overal bed mobility: Needs Assistance Bed Mobility: Supine to Sit     Supine to sit: Min assist     General bed mobility comments: Min A for LE and trunk management towards EOB  Transfers Overall transfer level: Needs assistance Equipment used: Rolling walker (2 wheeled) Transfers: Sit to/from Stand Sit to Stand: Min guard;Min assist;+2 safety/equipment         General transfer comment: light Min A to rise from bedside and chair  Ambulation/Gait Ambulation/Gait assistance: Min assist;Min guard;+2 safety/equipment Gait Distance (Feet): 40 Feet Assistive device: Rolling walker (2 wheeled) Gait Pattern/deviations: Step-to pattern;Shuffle;Decreased stride length;Trunk flexed Gait velocity: decreased   General Gait Details: cueing for safety and obstacle navigation - wife states he often has trouble steering RW  Stairs             Wheelchair Mobility    Modified Rankin (Stroke Patients Only)       Balance Overall balance assessment: Needs assistance Sitting-balance support: No upper extremity supported;Feet supported Sitting balance-Leahy Scale: Fair     Standing balance support: Bilateral upper extremity supported;During functional activity Standing balance-Leahy Scale: Poor                               Pertinent Vitals/Pain      Home Living Family/patient expects to be discharged to:: Private residence Living Arrangements: Alone Available Help at Discharge: Family;Available 24 hours/day Type of Home: House Home Access: Ramped entrance     Home Layout: One level Home Equipment: Walker - 2 wheels;Cane - single point      Prior Function Level of Independence: Independent with assistive device(s)         Comments: wife provides supervision with bathing; reports a history of falls - feels like hip gives out - last fall >4 weeks ago     Hand Dominance   Dominant Hand: Right    Extremity/Trunk Assessment   Upper Extremity Assessment Upper Extremity Assessment: Defer to OT evaluation    Lower Extremity Assessment Lower Extremity Assessment: Generalized weakness    Cervical / Trunk Assessment Cervical / Trunk Assessment: Kyphotic  Communication   Communication: HOH  Cognition Arousal/Alertness: Awake/alert Behavior During Therapy: WFL for tasks assessed/performed Overall Cognitive Status: Within Functional Limits for tasks assessed  General Comments      Exercises     Assessment/Plan    PT Assessment Patient needs continued PT services  PT Problem List Decreased strength;Decreased activity tolerance;Decreased balance;Decreased mobility;Decreased knowledge of use of DME;Decreased safety awareness       PT Treatment Interventions DME instruction;Gait training;Functional mobility training;Therapeutic  activities;Balance training;Therapeutic exercise;Patient/family education    PT Goals (Current goals can be found in the Care Plan section)  Acute Rehab PT Goals Patient Stated Goal: return home soon PT Goal Formulation: With patient Time For Goal Achievement: 06/20/19 Potential to Achieve Goals: Good    Frequency Min 3X/week   Barriers to discharge        Co-evaluation PT/OT/SLP Co-Evaluation/Treatment: Yes Reason for Co-Treatment: For patient/therapist safety;To address functional/ADL transfers PT goals addressed during session: Mobility/safety with mobility;Balance;Proper use of DME         AM-PAC PT "6 Clicks" Mobility  Outcome Measure Help needed turning from your back to your side while in a flat bed without using bedrails?: A Little Help needed moving from lying on your back to sitting on the side of a flat bed without using bedrails?: A Little Help needed moving to and from a bed to a chair (including a wheelchair)?: A Little Help needed standing up from a chair using your arms (e.g., wheelchair or bedside chair)?: A Little Help needed to walk in hospital room?: A Little Help needed climbing 3-5 steps with a railing? : A Lot 6 Click Score: 17    End of Session Equipment Utilized During Treatment: Gait belt Activity Tolerance: Patient tolerated treatment well Patient left: in bed;with call bell/phone within reach;with family/visitor present Nurse Communication: Mobility status PT Visit Diagnosis: Unsteadiness on feet (R26.81);Other abnormalities of gait and mobility (R26.89);Muscle weakness (generalized) (M62.81);History of falling (Z91.81)    Time: 1027-2536 PT Time Calculation (min) (ACUTE ONLY): 24 min   Charges:   PT Evaluation $PT Eval Moderate Complexity: 1 Mod          Lanney Gins, PT, DPT Supplemental Physical Therapist 06/06/19 2:32 PM Pager: [phone] Office: [phone]

## 2019-06-06 NOTE — Evaluation (Signed)
Occupational Therapy Evaluation Patient Details Name: [name] MRN: [id number] DOB: [birth year] Today's Date: 06/06/2019    History of Present Illness Patient is a 85 y/o male presenting with abdominal pain. Past medical history significant of HTN; HLD; and DM. Admitted for further work-up.    Clinical Impression   PTA, pt reports using RW for mobility and living with his wife. He has a history of recent falls. He currently requires min assist for functional toilet transfers and LB ADL with some instability and generalized weakness. HR in a-fib up to 159 with short distance mobility and pt asymptomatic throughout session. Pt's wife present at end of session and she reports that she is able to provide assistance at home and that pt is close to baseline. Recommend HHOT follow-up post-acute D/C and will continue to follow while admitted.     Follow Up Recommendations  Home health OT;Supervision/Assistance - 24 hour    Equipment Recommendations  3 in 1 bedside commode    Recommendations for Other Services       Precautions / Restrictions Precautions Precautions: Fall Restrictions Weight Bearing Restrictions: No      Mobility Bed Mobility Overal bed mobility: Needs Assistance Bed Mobility: Supine to Sit     Supine to sit: Min assist     General bed mobility comments: Min A for LE and trunk management towards EOB  Transfers Overall transfer level: Needs assistance Equipment used: Rolling walker (2 wheeled) Transfers: Sit to/from Stand Sit to Stand: Min guard;Min assist;+2 safety/equipment         General transfer comment: light Min A to rise from bedside and chair    Balance Overall balance assessment: Needs assistance Sitting-balance support: No upper extremity supported;Feet supported Sitting balance-Leahy Scale: Fair     Standing balance support: Bilateral upper extremity supported;During functional activity Standing balance-Leahy Scale: Poor                              ADL either performed or assessed with clinical judgement   ADL Overall ADL's : Needs assistance/impaired Eating/Feeding: Supervision/ safety;Sitting   Grooming: Min guard;Standing   Upper Body Bathing: Supervision/ safety;Sitting   Lower Body Bathing: Minimal assistance;Sit to/from stand   Upper Body Dressing : Supervision/safety;Sitting   Lower Body Dressing: Minimal assistance;Sit to/from stand   Toilet Transfer: Minimal assistance;Ambulation;RW Toilet Transfer Details (indicate cue type and reason): HR up to 159 in A-fib and limited mobility at that time Toileting- Clothing Manipulation and Hygiene: Min guard;Sitting/lateral lean       Functional mobility during ADLs: Minimal assistance;Rolling walker General ADL Comments: Pt limited in mobility due to elevated HR in A-fib     Vision         Perception     Praxis      Pertinent Vitals/Pain Pain Assessment: No/denies pain     Hand Dominance Right   Extremity/Trunk Assessment Upper Extremity Assessment Upper Extremity Assessment: Generalized weakness   Lower Extremity Assessment Lower Extremity Assessment: Generalized weakness   Cervical / Trunk Assessment Cervical / Trunk Assessment: Kyphotic   Communication Communication Communication: HOH   Cognition Arousal/Alertness: Awake/alert Behavior During Therapy: WFL for tasks assessed/performed Overall Cognitive Status: Within Functional Limits for tasks assessed                                 General Comments: HOH making it difficult to fully assess. He  was able to follow commands.    General Comments  Pt very motivated to be up and moving. Wife present at end of session. Reports mobility close to baseline.     Exercises     Shoulder Instructions      Home Living Family/patient expects to be discharged to:: Private residence Living Arrangements: Alone Available Help at Discharge: Family;Available 24  hours/day Type of Home: House Home Access: Ramped entrance     Home Layout: One level     Bathroom Shower/Tub: Walk-in shower         Home Equipment: Environmental consultant - 2 wheels;Cane - single point          Prior Functioning/Environment Level of Independence: Independent with assistive device(s)        Comments: wife provides supervision with bathing; reports a history of falls - feels like hip gives out - last fall >4 weeks ago        OT Problem List: Decreased strength;Decreased range of motion;Decreased activity tolerance;Impaired balance (sitting and/or standing);Decreased safety awareness;Decreased knowledge of use of DME or AE;Decreased knowledge of precautions      OT Treatment/Interventions: Self-care/ADL training;Therapeutic exercise;Energy conservation;DME and/or AE instruction;Therapeutic activities;Patient/family education;Balance training    OT Goals(Current goals can be found in the care plan section) Acute Rehab OT Goals Patient Stated Goal: return home soon OT Goal Formulation: With patient Time For Goal Achievement: 06/20/19 Potential to Achieve Goals: Good ADL Goals Pt Will Perform Upper Body Dressing: with modified independence;sitting Pt Will Perform Lower Body Dressing: with modified independence;sit to/from stand Pt Will Transfer to Toilet: with modified independence;ambulating;regular height toilet(with RW) Pt Will Perform Toileting - Clothing Manipulation and hygiene: with modified independence;sit to/from stand Pt Will Perform Tub/Shower Transfer: with supervision;ambulating;Tub transfer;shower seat;rolling walker  OT Frequency: Min 2X/week   Barriers to D/C:            Co-evaluation PT/OT/SLP Co-Evaluation/Treatment: Yes Reason for Co-Treatment: For patient/therapist safety;To address functional/ADL transfers PT goals addressed during session: Mobility/safety with mobility;Balance;Proper use of DME OT goals addressed during session: ADL's and  self-care      AM-PAC OT "6 Clicks" Daily Activity     Outcome Measure Help from another person eating meals?: None Help from another person taking care of personal grooming?: A Little Help from another person toileting, which includes using toliet, bedpan, or urinal?: A Little Help from another person bathing (including washing, rinsing, drying)?: A Little Help from another person to put on and taking off regular upper body clothing?: A Little Help from another person to put on and taking off regular lower body clothing?: A Little 6 Click Score: 19   End of Session Equipment Utilized During Treatment: Rolling walker Nurse Communication: Mobility status  Activity Tolerance: Patient tolerated treatment well Patient left: in bed;with family/visitor present  OT Visit Diagnosis: Other abnormalities of gait and mobility (R26.89);Muscle weakness (generalized) (M62.81)                Time: 8657-8469 OT Time Calculation (min): 24 min Charges:  OT General Charges $OT Visit: 1 Visit OT Evaluation $OT Eval Moderate Complexity: [place] A Rishab Stoudt 06/06/2019, 3:16 PM

## 2019-06-06 NOTE — ED Notes (Signed)
Report called by Jorene Guest, RN to Gretta Cool, RN at [facility] ED

## 2019-06-06 NOTE — ED Provider Notes (Signed)
[facility] Provider Note: Robert Spurling, MD, FACEP  CSN: [id number] MRN: [id number] ARRIVAL: 06/06/19 at [place]: [place]  Abdominal Pain   HISTORY OF PRESENT ILLNESS  06/06/19 12:29 AM [name] is a 85 y.o. male who has had the gradual onset of upper abdominal pain since yesterday morning.  He rates the pain currently as a 7 out of 10, describes it as pressure-like, and it is worse with palpation.  Yesterday evening he developed nausea and vomiting.  The vomiting became bilious.  He was given Zofran 4 mg IV by EMS prior to arrival and he has not vomited since.  He has a remote history of atrial flutter but to his knowledge has not had a recurrence.  He was noted to be in atrial fibrillation by EMS.  He is not on anticoagulation.   Past Medical History:  Diagnosis Date   Anemia    related to frequent nosebleeds-right nostril.   Arthritis    arthritis -back knees, most joints   Bleeding nose    occasional   Breathing difficulty 11-10-12   difficult to breath if laying posteriorly, right nare difficulty   Carotid artery occlusion    Change in hearing    Diabetes mellitus age 81   Dysrhythmia 07/2012   hx. Atrial Flutter x1, converted spontaneously-no ploblems.    H/O hiatal hernia    HOH (hard of hearing) 11-10-12   bilaterally   Hyperlipidemia    Hypertension    Joint pain    Ulcer     Past Surgical History:  Procedure Laterality Date   CAROTID DUPLEX  06/30/2012   PATENT RIGHT CAROTID ENDARTERECTOMY. 1%-39% LEFT ICA. STABLE   CAROTID ENDARTERECTOMY  11/242008   right with Dacron patch angioplasty   CATARACT EXTRACTION  1998 bilateral  done 3 months apart   EYE SURGERY     JOINT REPLACEMENT Right 11-18-12   KNEE BURSECTOMY  11/18/2012   Procedure: KNEE BURSECTOMY;  Surgeon: Tobi Bastos, MD;  Location: [facility];  Service: Orthopedics;  Laterality: Right;   NM MYOCAR MULTIPLE W/SPECT  10/08/2012   NORMAL STRESS  NUCLEAR STUDY.   TOTAL KNEE ARTHROPLASTY  11/18/2012   Procedure: TOTAL KNEE ARTHROPLASTY;  Surgeon: Tobi Bastos, MD;  Location: [facility];  Service: Orthopedics;  Laterality: Right;   TRANSTHORACIC ECHOCARDIOGRAM  10/08/2012   MODERATE LVH. MODERATE CONCENTRIC HYPERTROPHY.EF 65 TO 70%. GRADE 1 DYSTOLIC DYSFUNCTION. ELEVATED VENTRICULAR END-DIASTOLIC FILLING PRESSURE AND LEFT ATRIAL FILLING PRESSURE. AV- MILD TO MODERATE CALCIFIED ANNULUS. MV- CALCIFIC DEGENERATION.Marland Kitchen LA-MILDLY DILATED.    Family History  Problem Relation Age of Onset   Cancer Mother        Pancreatic   Heart disease Mother    Cancer Brother        pancreactic   Cancer Daughter        Breast    Social History   Tobacco Use   Smoking status: Former Smoker    Types: Cigarettes    Quit date: 10/21/1976    Years since quitting: 42.6   Smokeless tobacco: Never Used  Substance Use Topics   Alcohol use: Yes    Alcohol/week: 1.0 standard drinks    Types: 1 Glasses of wine per week    Comment: 1-2 alcoholic drinks daily   Drug use: No    Prior to Admission medications   Medication Sig Start Date End Date Taking? Authorizing Provider  acetaminophen (TYLENOL) 500 MG tablet Take 500 mg  by mouth every 6 (six) hours as needed. Pain    [provider]  atorvastatin (LIPITOR) 80 MG tablet Take 40 mg by mouth every evening. Takes 1/2    [provider]  Calcium-Vitamin D (CALTRATE 600 PLUS-VIT D PO) Take by mouth daily.    [provider]  Cholecalciferol (VITAMIN D3) 2000 UNITS TABS Take by mouth daily.    [provider]  diltiazem (CARDIZEM CD) 120 MG 24 hr capsule Take 120 mg by mouth daily. 10/29/13   [provider]  hydrochlorothiazide (HYDRODIURIL) 25 MG tablet Take 12.5 mg by mouth daily. 09/24/13   [provider]  insulin detemir (LEVEMIR) 100 UNIT/ML injection Inject 10-30 Units into the skin 2 (two) times daily. Takes 40 units in the morning and 10 units  at night    [provider]  losartan (COZAAR) 100 MG tablet Take 100 mg by mouth daily before breakfast.     [provider]  metFORMIN (GLUCOPHAGE) 1000 MG tablet Take 1,000 mg by mouth 2 (two) times daily with a meal.    [provider]  traZODone (DESYREL) 100 MG tablet Take 100 mg by mouth at bedtime.      [provider]    Allergies Patient has no known allergies.   REVIEW OF SYSTEMS  Negative except as noted here or in the History of Present Illness.   PHYSICAL EXAMINATION  Initial Vital Signs Pulse 91, temperature 98 F (36.7 C), temperature source Oral, resp. rate 18, height 6' (1.829 m), weight 86.2 kg, SpO2 97 %.  Examination General: Well-developed, well-nourished male in no acute distress; appearance consistent with age of record HENT: normocephalic; atraumatic Eyes: pupils equal, round and reactive to light; extraocular muscles intact; bilateral pseudophakia Neck: supple Heart: Irregular rhythm; occasional PVCs Lungs: clear to auscultation bilaterally Abdomen: soft; nondistended; upper abdominal tenderness; no masses or hepatosplenomegaly; bowel sounds present Extremities: No deformity; full range of motion; pulses normal Neurologic: Awake, alert and oriented; motor function intact in all extremities and symmetric; no facial droop Skin: Warm and dry Psychiatric: Normal mood and affect   RESULTS  Summary of this visit's results, reviewed by myself:   EKG Interpretation  Date/Time:  Sunday June 06 2019 00:43:49 EDT Ventricular Rate:  95 PR Interval:    QRS Duration: 142 QT Interval:  408 QTC Calculation: 513 R Axis:   -5 Text Interpretation:  Atrial fibrillation Right bundle branch block Baseline wander in lead(s) I III aVL V2 V6 Previously NSR Confirmed by Shanon Rosser [phone]) on 06/06/2019 12:56:44 AM      Laboratory Studies: Results for orders placed or performed during the hospital encounter of 06/06/19 (from the  past 24 hour(s))  CBC with Differential/Platelet     Status: Abnormal   Collection Time: 06/06/19 12:43 AM  Result Value Ref Range   WBC 13.5 (H) 4.0 - 10.5 K/uL   RBC 4.29 4.22 - 5.81 MIL/uL   Hemoglobin 13.6 13.0 - 17.0 g/dL   HCT 41.4 39.0 - 52.0 %   MCV 96.5 80.0 - 100.0 fL   MCH 31.7 26.0 - 34.0 pg   MCHC 32.9 30.0 - 36.0 g/dL   RDW 13.6 11.5 - 15.5 %   Platelets 230 150 - 400 K/uL   nRBC 0.0 0.0 - 0.2 %   Neutrophils Relative % 88 %   Neutro Abs 11.8 (H) 1.7 - 7.7 K/uL   Lymphocytes Relative 6 %   Lymphs Abs 0.8 0.7 - 4.0 K/uL  Monocytes Relative 6 %   Monocytes Absolute 0.7 0.1 - 1.0 K/uL   Eosinophils Relative 0 %   Eosinophils Absolute 0.0 0.0 - 0.5 K/uL   Basophils Relative 0 %   Basophils Absolute 0.0 0.0 - 0.1 K/uL   Immature Granulocytes 0 %   Abs Immature Granulocytes 0.05 0.00 - 0.07 K/uL  Urinalysis, Routine w reflex microscopic     Status: Abnormal   Collection Time: 06/06/19 12:43 AM  Result Value Ref Range   Color, Urine YELLOW YELLOW   APPearance CLEAR CLEAR   Specific Gravity, Urine >1.030 (H) 1.005 - 1.030   pH 6.5 5.0 - 8.0   Glucose, UA >=500 (A) NEGATIVE mg/dL   Hgb urine dipstick TRACE (A) NEGATIVE   Bilirubin Urine NEGATIVE NEGATIVE   Ketones, ur 40 (A) NEGATIVE mg/dL   Protein, ur >300 (A) NEGATIVE mg/dL   Nitrite NEGATIVE NEGATIVE   Leukocytes,Ua NEGATIVE NEGATIVE  Urinalysis, Microscopic (reflex)     Status: None   Collection Time: 06/06/19 12:43 AM  Result Value Ref Range   RBC / HPF 0-5 0 - 5 RBC/hpf   WBC, UA 0-5 0 - 5 WBC/hpf   Bacteria, UA NONE SEEN NONE SEEN   Squamous Epithelial / LPF NONE SEEN 0 - 5   Granular Casts, UA PRESENT   Comprehensive metabolic panel     Status: Abnormal   Collection Time: 06/06/19  1:08 AM  Result Value Ref Range   Sodium 137 135 - 145 mmol/L   Potassium 3.8 3.5 - 5.1 mmol/L   Chloride 97 (L) 98 - 111 mmol/L   CO2 24 22 - 32 mmol/L   Glucose, Bld 215 (H) 70 - 99 mg/dL   BUN 15 8 - 23 mg/dL    Creatinine, Ser 0.94 0.61 - 1.24 mg/dL   Calcium 9.8 8.9 - 10.3 mg/dL   Total Protein 7.5 6.5 - 8.1 g/dL   Albumin 4.0 3.5 - 5.0 g/dL   AST 15 15 - 41 U/L   ALT 13 0 - 44 U/L   Alkaline Phosphatase 91 38 - 126 U/L   Total Bilirubin 1.1 0.3 - 1.2 mg/dL   GFR calc non Af Amer >60 >60 mL/min   GFR calc Af Amer >60 >60 mL/min   Anion gap 16 (H) 5 - 15  Lipase, blood     Status: None   Collection Time: 06/06/19  1:08 AM  Result Value Ref Range   Lipase 21 11 - 51 U/L   Imaging Studies: Ct Angio Abd/pel W And/or Wo Contrast  Result Date: 06/06/2019 CLINICAL DATA:  85 year old male with abdominal pain and vomiting. EXAM: CTA ABDOMEN AND PELVIS wITHOUT AND WITH CONTRAST TECHNIQUE: Multidetector CT imaging of the abdomen and pelvis was performed using the standard protocol during bolus administration of intravenous contrast. Multiplanar reconstructed images and MIPs were obtained and reviewed to evaluate the vascular anatomy. CONTRAST:  172mL OMNIPAQUE IOHEXOL 350 MG/ML SOLN COMPARISON:  None. FINDINGS: VASCULAR Aorta: There is advanced atherosclerotic calcification. No aneurysmal dilatation or dissection. Celiac: Patent without evidence of aneurysm, dissection, vasculitis or significant stenosis. SMA: Patent without evidence of aneurysm, dissection, vasculitis or significant stenosis. Renals: Atherosclerotic calcification of the renal ostia. The renal arteries are patent. IMA: Patent without evidence of aneurysm, dissection, vasculitis or significant stenosis. Inflow: Patent without evidence of aneurysm, dissection, vasculitis or significant stenosis. Atherosclerotic calcification. Proximal Outflow: Bilateral common femoral and visualized portions of the superficial and profunda femoral arteries are patent without evidence of aneurysm, dissection,  vasculitis or significant stenosis. There is atherosclerotic calcification. Veins: No obvious venous abnormality within the limitations of this arterial phase  study. Review of the MIP images confirms the above findings. NON-VASCULAR Lower chest: The visualized lung bases are clear. There is coronary vascular calcification. No intra-abdominal free air or free fluid. Hepatobiliary: Diffuse fatty infiltration of the liver. No intrahepatic biliary ductal dilatation. Gallbladder is mildly distended. Faint focus of high attenuation in the gallbladder neck may represent a vessel although a small stone in the gall bladder neck is not excluded. No pericholecystic fluid. There is however slight apparent hyperemia gallbladder wall. Correlation with ultrasound is recommended Pancreas: The pancreas is moderately atrophic. There is a 13 x 13 mm hypodense lesion in the body of the pancreas which is not characterized but may represent a side branch IPMN. Further evaluation with MRI on a nonemergent basis recommended. No active inflammatory changes. Spleen: Normal in size without focal abnormality. Adrenals/Urinary Tract: Mild left adrenal thickening. The right adrenal gland is unremarkable. Several scattered punctate nonobstructing bilateral renal calculi versus renal vascular calcification. No hydronephrosis. Subcentimeter left renal upper pole hypodensity is not characterized. The visualized ureters and urinary bladder appear unremarkable. Stomach/Bowel: There is severe sigmoid and diffuse colonic diverticulosis without active inflammatory changes. There is no bowel obstruction or active inflammation. The appendix is normal. Lymphatic: No adenopathy. Reproductive: The prostate and seminal vesicles are grossly unremarkable. Other: There is an area of skin thickening and subcutaneous induration of the upper anterior abdominal wall. No fluid collection. Musculoskeletal: Osteopenia with degenerative changes of the spine. No acute osseous pathology. IMPRESSION: 1. Mildly distended gallbladder with apparent hyperemia of the gallbladder wall. Correlation with ultrasound recommended to exclude  acute cholecystitis. 2. Fatty liver. 3. Severe colonic diverticulosis. No bowel obstruction or active inflammation. Normal appendix. 4. A 13 x 13 mm hypodense lesion in the body of the pancreas may represent a side branch IPMN. Further evaluation with MRI on a nonemergent basis recommended. 5. No abdominal aortic aneurysm dissection. Aortic Atherosclerosis (ICD10-I70.0). Electronically Signed   By: Anner Crete M.D.   On: 06/06/2019 02:16    ED COURSE and MDM  Nursing notes and initial vitals signs, including pulse oximetry, reviewed.  Vitals:   06/06/19 0028 06/06/19 0029 06/06/19 0112 06/06/19 0130  BP:  (!) 172/83 (!) 179/74 (!) 163/80  Pulse:   82 88  Resp: 18  20 17   Temp:      TempSrc:      SpO2:   94% 97%  Weight:      Height:       2:42 AM CT suggestive of gallbladder disease.  We will transport to [facility] ED for ultrasound.  Dr. Christy Gentles is the accepting EDP.  He was also advised of the patient's incidental finding of atrial fibrillation.  PROCEDURES    ED DIAGNOSES     ICD-10-CM   1. Upper abdominal pain  R10.10   2. Nausea and vomiting in adult  R11.2   3. New onset atrial fibrillation ([place])  I48.91        Kelijah Towry, MD 06/06/19 [phone]

## 2019-06-06 NOTE — ED Notes (Signed)
Called [facility] - s/w Tammy - requested consult to ED physician @ [facility]

## 2019-06-07 DIAGNOSIS — E876 Hypokalemia: Secondary | ICD-10-CM | POA: Diagnosis not present

## 2019-06-07 DIAGNOSIS — K449 Diaphragmatic hernia without obstruction or gangrene: Secondary | ICD-10-CM | POA: Diagnosis present

## 2019-06-07 DIAGNOSIS — K8020 Calculus of gallbladder without cholecystitis without obstruction: Secondary | ICD-10-CM | POA: Diagnosis not present

## 2019-06-07 DIAGNOSIS — Z803 Family history of malignant neoplasm of breast: Secondary | ICD-10-CM | POA: Diagnosis not present

## 2019-06-07 DIAGNOSIS — E785 Hyperlipidemia, unspecified: Secondary | ICD-10-CM | POA: Diagnosis present

## 2019-06-07 DIAGNOSIS — Z66 Do not resuscitate: Secondary | ICD-10-CM | POA: Diagnosis present

## 2019-06-07 DIAGNOSIS — D490 Neoplasm of unspecified behavior of digestive system: Secondary | ICD-10-CM | POA: Diagnosis present

## 2019-06-07 DIAGNOSIS — I480 Paroxysmal atrial fibrillation: Secondary | ICD-10-CM | POA: Diagnosis present

## 2019-06-07 DIAGNOSIS — Z87891 Personal history of nicotine dependence: Secondary | ICD-10-CM | POA: Diagnosis not present

## 2019-06-07 DIAGNOSIS — H9190 Unspecified hearing loss, unspecified ear: Secondary | ICD-10-CM | POA: Diagnosis present

## 2019-06-07 DIAGNOSIS — R1013 Epigastric pain: Secondary | ICD-10-CM | POA: Diagnosis present

## 2019-06-07 DIAGNOSIS — Z794 Long term (current) use of insulin: Secondary | ICD-10-CM | POA: Diagnosis not present

## 2019-06-07 DIAGNOSIS — E11649 Type 2 diabetes mellitus with hypoglycemia without coma: Secondary | ICD-10-CM | POA: Diagnosis not present

## 2019-06-07 DIAGNOSIS — Z96651 Presence of right artificial knee joint: Secondary | ICD-10-CM | POA: Diagnosis present

## 2019-06-07 DIAGNOSIS — I4891 Unspecified atrial fibrillation: Secondary | ICD-10-CM | POA: Diagnosis not present

## 2019-06-07 DIAGNOSIS — K82A1 Gangrene of gallbladder in cholecystitis: Secondary | ICD-10-CM | POA: Diagnosis present

## 2019-06-07 DIAGNOSIS — Z800 Family history of malignant neoplasm of digestive organs: Secondary | ICD-10-CM | POA: Diagnosis not present

## 2019-06-07 DIAGNOSIS — Z20828 Contact with and (suspected) exposure to other viral communicable diseases: Secondary | ICD-10-CM | POA: Diagnosis present

## 2019-06-07 DIAGNOSIS — K8013 Calculus of gallbladder with acute and chronic cholecystitis with obstruction: Secondary | ICD-10-CM | POA: Diagnosis present

## 2019-06-07 DIAGNOSIS — M159 Polyosteoarthritis, unspecified: Secondary | ICD-10-CM | POA: Diagnosis present

## 2019-06-07 DIAGNOSIS — N179 Acute kidney failure, unspecified: Secondary | ICD-10-CM | POA: Diagnosis not present

## 2019-06-07 DIAGNOSIS — I10 Essential (primary) hypertension: Secondary | ICD-10-CM | POA: Diagnosis not present

## 2019-06-07 DIAGNOSIS — I4510 Unspecified right bundle-branch block: Secondary | ICD-10-CM | POA: Diagnosis not present

## 2019-06-07 DIAGNOSIS — E871 Hypo-osmolality and hyponatremia: Secondary | ICD-10-CM | POA: Diagnosis not present

## 2019-06-07 DIAGNOSIS — K2900 Acute gastritis without bleeding: Secondary | ICD-10-CM | POA: Diagnosis present

## 2019-06-07 DIAGNOSIS — I4892 Unspecified atrial flutter: Secondary | ICD-10-CM | POA: Diagnosis not present

## 2019-06-07 DIAGNOSIS — E860 Dehydration: Secondary | ICD-10-CM | POA: Diagnosis present

## 2019-06-07 DIAGNOSIS — K869 Disease of pancreas, unspecified: Secondary | ICD-10-CM | POA: Diagnosis present

## 2019-06-07 LAB — BASIC METABOLIC PANEL
Anion gap: 13 (ref 5–15)
BUN: 13 mg/dL (ref 8–23)
CO2: 29 mmol/L (ref 22–32)
Calcium: 9.4 mg/dL (ref 8.9–10.3)
Chloride: 94 mmol/L — ABNORMAL LOW (ref 98–111)
Creatinine, Ser: 1.1 mg/dL (ref 0.61–1.24)
GFR calc Af Amer: >60 mL/min (ref >60)
GFR calc non Af Amer: >60 mL/min (ref >60)
Glucose, Bld: 146 mg/dL — ABNORMAL HIGH (ref 70–99)
Potassium: 3.5 mmol/L (ref 3.5–5.1)
Sodium: 136 mmol/L (ref 135–145)

## 2019-06-07 LAB — CBC
HCT: 40.6 % (ref 39.0–52.0)
Hemoglobin: 13.7 g/dL (ref 13.0–17.0)
MCH: 32.1 pg (ref 26.0–34.0)
MCHC: 33.7 g/dL (ref 30.0–36.0)
MCV: 95.1 fL (ref 80.0–100.0)
Platelets: 230 10*3/uL (ref 150–400)
RBC: 4.27 MIL/uL (ref 4.22–5.81)
RDW: 13.4 % (ref 11.5–15.5)
WBC: 19.1 10*3/uL — ABNORMAL HIGH (ref 4.0–10.5)
nRBC: 0 % (ref 0.0–0.2)

## 2019-06-07 LAB — GLUCOSE, CAPILLARY
Glucose-Capillary: 110 mg/dL — ABNORMAL HIGH (ref 70–99)
Glucose-Capillary: 120 mg/dL — ABNORMAL HIGH (ref 70–99)
Glucose-Capillary: 125 mg/dL — ABNORMAL HIGH (ref 70–99)
Glucose-Capillary: 127 mg/dL — ABNORMAL HIGH (ref 70–99)
Glucose-Capillary: 36 mg/dL — CL (ref 70–99)
Glucose-Capillary: 53 mg/dL — ABNORMAL LOW (ref 70–99)
Glucose-Capillary: 56 mg/dL — ABNORMAL LOW (ref 70–99)
Glucose-Capillary: 61 mg/dL — ABNORMAL LOW (ref 70–99)
Glucose-Capillary: 89 mg/dL (ref 70–99)

## 2019-06-07 MED ORDER — DEXTROSE 10 % IV SOLN
INTRAVENOUS | Status: DC
  Administered 2019-06-07: via INTRAVENOUS

## 2019-06-07 MED ORDER — INSULIN ASPART 100 UNIT/ML ~~LOC~~ SOLN: 0.0000 [IU] | Freq: Every day | SUBCUTANEOUS | Status: DC

## 2019-06-07 MED ORDER — DEXTROSE 50 % IV SOLN
INTRAVENOUS | Status: AC
  Administered 2019-06-07: 50 mL
  Filled 2019-06-07: qty 50

## 2019-06-07 MED ORDER — INSULIN DETEMIR 100 UNIT/ML ~~LOC~~ SOLN
35.0000 [IU] | Freq: Every morning | SUBCUTANEOUS | Status: DC
  Administered 2019-06-08 – 2019-06-10 (×3): 35 [IU] via SUBCUTANEOUS
  Filled 2019-06-07 (×4): qty 0.35

## 2019-06-07 MED ORDER — GLUCOSE 40 % PO GEL
ORAL | Status: AC
  Administered 2019-06-07: 37.5 g
  Filled 2019-06-07: qty 1

## 2019-06-07 MED ORDER — SODIUM CHLORIDE 0.9 % IV SOLN: INTRAVENOUS | Status: DC

## 2019-06-07 MED ORDER — INSULIN ASPART 100 UNIT/ML ~~LOC~~ SOLN
0.0000 [IU] | Freq: Three times a day (TID) | SUBCUTANEOUS | Status: DC
  Administered 2019-06-08: 2 [IU] via SUBCUTANEOUS
  Administered 2019-06-09: 1 [IU] via SUBCUTANEOUS
  Administered 2019-06-09: 2 [IU] via SUBCUTANEOUS
  Administered 2019-06-10: 3 [IU] via SUBCUTANEOUS
  Administered 2019-06-11 – 2019-06-12 (×2): 2 [IU] via SUBCUTANEOUS
  Administered 2019-06-12: 3 [IU] via SUBCUTANEOUS
  Administered 2019-06-12 – 2019-06-13 (×3): 2 [IU] via SUBCUTANEOUS
  Administered 2019-06-13 – 2019-06-14 (×2): 1 [IU] via SUBCUTANEOUS
  Administered 2019-06-14 – 2019-06-15 (×2): 3 [IU] via SUBCUTANEOUS
  Administered 2019-06-15: 2 [IU] via SUBCUTANEOUS

## 2019-06-07 NOTE — Progress Notes (Signed)
Inpatient Diabetes Program Recommendations  AACE/ADA: New Consensus Statement on Inpatient Glycemic Control (2015)  Target Ranges:  Prepandial:   less than 140 mg/dL      Peak postprandial:   less than 180 mg/dL (1-2 hours)      Critically ill patients:  140 - 180 mg/dL   Results for [name], [name] (MRN [id number]) as of 06/07/2019 13:09  Ref. Range 06/06/2019 11:59 06/06/2019 14:54 06/06/2019 16:23 06/06/2019 21:08 06/06/2019 21:54  Glucose-Capillary Latest Ref Range: 70 - 99 mg/dL 190 (H)  3 units NOVOLOG +  40 units LEVEMIR  306 (H) 243 (H)  5 units NOVOLOG  50 (L)    LEVEMIR HELD 79   Results for [name], [name] (MRN [id number]) as of 06/07/2019 13:09  Ref. Range 06/07/2019 06:49 06/07/2019 11:13  Glucose-Capillary Latest Ref Range: 70 - 99 mg/dL 125 (H)  2 units NOVOLOG +  40 units LEVEMIR 120 (H)    Admit with: Abd Pain  History: DM  Home DM Meds: Levemir 35 units BID       Metformin 1000 mg BID  Current Orders: Levemir 40 units AM/ 10 units QHS      Novolog Moderate Correction Scale/ SSI (0-15 units) TID AC      MD- Note patient with Hypoglycemia last night at bedtime.  Levemir 10 units QHS was HELD last PM.  Not sure if the Levemir or the Novolog caused the Hypoglycemia last PM.  Please consider the following:  1. Stop Levemir 10 units QHS  2. Reduce AM dose of Levemir to 35 units Daily  3. Reduce Novolog SSi to the Sensitive scale (0-9 units)    --Will follow patient during hospitalization--  Wyn Quaker RN, MSN, CDE Diabetes Coordinator Inpatient Glycemic Control Team Team Pager: [phone] (8a-5p)

## 2019-06-07 NOTE — H&P (View-Only) (Signed)
Referring Provider: Dr. Eliseo Squires Primary Care Physician:  [name], MD Primary Gastroenterologist:  Dr. Oletta Lamas  Reason for Consultation:  Epigastric pain  HPI: [name] is a 85 y.o. male with a remote history of peptic ulcer disease in 1970's and 1995 (bleeding ulcer that he attributes to NSAIDs at that time). Reported history of pancreatitis from his daughter (by phone) and wife at bedside. This past Friday he had the acute onset of severe sharp pain across his upper abdomen mainly in the epigastric region along with radiation to his back. Had N/V with the abdominal pain. Denies melena or hematochezia. Denies dysphagia, heartburn, or weight loss. Denies NSAIDs. Hgb 13.7, WBC 19.1. LFTs, lipase wnl. U/S with normal GB and no CBD dilation. Tiny gallstone seen. CT Angio showed non-specific hypodense lesion (13 mm X 13 mm) in pancreatic body without pancreatitis. Mildly distended GB, fatty liver. No abdominal aortic aneurysm seen.  Past Medical History:  Diagnosis Date  . Anemia    related to frequent nosebleeds-right nostril.  . Arthritis    arthritis -back knees, most joints  . Breathing difficulty 11-10-12   difficult to breath if laying posteriorly, right nare difficulty  . Carotid artery occlusion   . Change in hearing   . Diabetes mellitus age 65  . Dysrhythmia 07/2012   hx. Atrial Flutter x1, converted spontaneously-no ploblems.   . H/O hiatal hernia   . HOH (hard of hearing) 11-10-12   bilaterally  . Hyperlipidemia   . Hypertension   . Ulcer 1995   bleeding ulcer, none since    Past Surgical History:  Procedure Laterality Date  . CAROTID DUPLEX  06/30/2012   PATENT RIGHT CAROTID ENDARTERECTOMY. 1%-39% LEFT ICA. STABLE  . CAROTID ENDARTERECTOMY  11/242008   right with Dacron patch angioplasty  . CATARACT EXTRACTION  1998 bilateral  done 3 months apart  . EYE SURGERY    . JOINT REPLACEMENT Right 11-18-12  . KNEE BURSECTOMY  11/18/2012   Procedure: KNEE BURSECTOMY;  Surgeon:  Tobi Bastos, MD;  Location: [facility];  Service: Orthopedics;  Laterality: Right;  . NM MYOCAR MULTIPLE W/SPECT  10/08/2012   NORMAL STRESS NUCLEAR STUDY.  . TOTAL KNEE ARTHROPLASTY  11/18/2012   Procedure: TOTAL KNEE ARTHROPLASTY;  Surgeon: Tobi Bastos, MD;  Location: [facility];  Service: Orthopedics;  Laterality: Right;  . TRANSTHORACIC ECHOCARDIOGRAM  10/08/2012   MODERATE LVH. MODERATE CONCENTRIC HYPERTROPHY.EF 65 TO 70%. GRADE 1 DYSTOLIC DYSFUNCTION. ELEVATED VENTRICULAR END-DIASTOLIC FILLING PRESSURE AND LEFT ATRIAL FILLING PRESSURE. AV- MILD TO MODERATE CALCIFIED ANNULUS. MV- CALCIFIC DEGENERATION.Marland Kitchen LA-MILDLY DILATED.    Prior to Admission medications   Medication Sig Start Date End Date Taking? Authorizing Provider  acetaminophen (TYLENOL) 500 MG tablet Take 500 mg by mouth every 6 (six) hours as needed. Pain   Yes [provider]  atorvastatin (LIPITOR) 80 MG tablet Take 80 mg by mouth every evening.    Yes [provider]  diltiazem (DILACOR XR) 180 MG 24 hr capsule Take 180 mg by mouth daily.   Yes [provider]  gabapentin (NEURONTIN) 100 MG capsule Take 200-400 mg by mouth See admin instructions. 200 mg  in the morning and 400 mg at night   Yes [provider]  gemfibrozil (LOPID) 600 MG tablet Take 600 mg by mouth daily.    Yes [provider]  insulin detemir (LEVEMIR) 100 UNIT/ML injection Inject 35-45 Units into the skin See admin instructions. Takes 35 units in the morning and 35 units  at night   Yes [provider]  losartan (COZAAR) 100 MG tablet Take 100 mg by mouth daily before breakfast.    Yes [provider]  metFORMIN (GLUCOPHAGE) 1000 MG tablet Take 1,000 mg by mouth 2 (two) times daily with a meal.   Yes [provider]  Omega-3 1000 MG CAPS Take 2 g by mouth 2 (two) times daily.   Yes [provider]  omeprazole (PRILOSEC) 20 MG capsule Take 20 mg by mouth daily.   Yes [provider]  traZODone (DESYREL) 100 MG tablet Take 100 mg by mouth at bedtime as needed for sleep.    Yes [provider]    Scheduled Meds: . aspirin EC  81 mg Oral Daily  . atorvastatin  40 mg Oral QPM  . diltiazem  180 mg Oral Daily  . docusate sodium  100 mg Oral BID  . enoxaparin (LOVENOX) injection  40 mg Subcutaneous Q24H  . gabapentin  100 mg Oral TID  . hydrochlorothiazide  25 mg Oral Daily  . insulin aspart  0-5 Units Subcutaneous QHS  . insulin aspart  0-9 Units Subcutaneous TID WC  . [START ON 06/08/2019] insulin detemir  35 Units Subcutaneous q morning - 10a  . losartan  100 mg Oral QAC breakfast  . pantoprazole (PROTONIX) IV  40 mg Intravenous Q12H  . sodium chloride flush  3 mL Intravenous Q12H  . sucralfate  1 g Oral TID WC & HS  . traZODone  100 mg Oral QHS   Continuous Infusions: . sodium chloride    . lactated ringers 75 mL/hr at 06/06/19 2230   PRN Meds:.acetaminophen **OR** acetaminophen, fentaNYL (SUBLIMAZE) injection, ondansetron **OR** ondansetron (ZOFRAN) IV  Allergies as of 06/06/2019  . (No Known Allergies)    Family History  Problem Relation Age of Onset  . Cancer Mother        Pancreatic  . Heart disease Mother   . Cancer Brother        pancreactic  . Cancer Daughter        Breast    Social History   Socioeconomic History  . Marital status: Married    Spouse name: Not on file  . Number of children: Not on file  . Years of education: Not on file  . Highest education level: Not on file  Occupational History  . Occupation: retired  Scientific laboratory technician  . Financial resource strain: Not on file  . Food insecurity    Worry: Not on file    Inability: Not on file  . Transportation needs    Medical: Not on file    Non-medical: Not on file  Tobacco Use  . Smoking status: Former Smoker    Types: Cigarettes    Quit date: 10/21/1976    Years since quitting: 42.6  . Smokeless tobacco: Never Used  Substance and Sexual Activity  .  Alcohol use: Yes    Alcohol/week: 1.0 standard drinks    Types: 1 Glasses of wine per week    Comment: 1-2 alcoholic drinks daily  . Drug use: No  . Sexual activity: Not on file  Lifestyle  . Physical activity    Days per week: Not on file    Minutes per session: Not on file  . Stress: Not on file  Relationships  . Social Herbalist on phone: Not on file    Gets together: Not on file    Attends religious service: Not on file  Active member of club or organization: Not on file    Attends meetings of clubs or organizations: Not on file    Relationship status: Not on file  . Intimate partner violence    Fear of current or ex partner: Not on file    Emotionally abused: Not on file    Physically abused: Not on file    Forced sexual activity: Not on file  Other Topics Concern  . Not on file  Social History Narrative  . Not on file    Review of Systems: All negative except as stated above in HPI.  Physical Exam: Vital signs: Vitals:   06/07/19 0453 06/07/19 0856  BP: (!) 144/72 (!) 142/78  Pulse: 81 73  Resp: 17 18  Temp: (!) 97.5 F (36.4 C) 97.9 F (36.6 C)  SpO2: 92% 95%     General:   Lethargic, elderly, thin, pleasant, no acute distress Head: normocephalic, atraumatic Eyes: anicteric sclera ENT: oropharynx clear Neck: supple, nontender Lungs:  Clear throughout to auscultation.   No wheezes, crackles, or rhonchi. No acute distress. Heart:  Regular rate and rhythm; no murmurs, clicks, rubs,  or gallops. Abdomen: minimal epigastric tenderness without guarding, soft, nondistended, +BS  Rectal:  Deferred Ext: no edema  GI:  Lab Results: Recent Labs    06/06/19 0043 06/07/19 0749  WBC 13.5* 19.1*  HGB 13.6 13.7  HCT 41.4 40.6  PLT 230 230   BMET Recent Labs    06/06/19 0108 06/07/19 0749  NA 137 136  K 3.8 3.5  CL 97* 94*  CO2 24 29  GLUCOSE 215* 146*  BUN 15 13  CREATININE 0.94 1.10  CALCIUM 9.8 9.4   LFT Recent Labs     06/06/19 0108  PROT 7.5  ALBUMIN 4.0  AST 15  ALT 13  ALKPHOS 91  BILITOT 1.1   PT/INR No results for input(s): LABPROT, INR in the last 72 hours.   Studies/Results: Ct Angio Abd/pel W And/or Wo Contrast  Result Date: 06/06/2019 CLINICAL DATA:  85 year old male with abdominal pain and vomiting. EXAM: CTA ABDOMEN AND PELVIS wITHOUT AND WITH CONTRAST TECHNIQUE: Multidetector CT imaging of the abdomen and pelvis was performed using the standard protocol during bolus administration of intravenous contrast. Multiplanar reconstructed images and MIPs were obtained and reviewed to evaluate the vascular anatomy. CONTRAST:  158mL OMNIPAQUE IOHEXOL 350 MG/ML SOLN COMPARISON:  None. FINDINGS: VASCULAR Aorta: There is advanced atherosclerotic calcification. No aneurysmal dilatation or dissection. Celiac: Patent without evidence of aneurysm, dissection, vasculitis or significant stenosis. SMA: Patent without evidence of aneurysm, dissection, vasculitis or significant stenosis. Renals: Atherosclerotic calcification of the renal ostia. The renal arteries are patent. IMA: Patent without evidence of aneurysm, dissection, vasculitis or significant stenosis. Inflow: Patent without evidence of aneurysm, dissection, vasculitis or significant stenosis. Atherosclerotic calcification. Proximal Outflow: Bilateral common femoral and visualized portions of the superficial and profunda femoral arteries are patent without evidence of aneurysm, dissection, vasculitis or significant stenosis. There is atherosclerotic calcification. Veins: No obvious venous abnormality within the limitations of this arterial phase study. Review of the MIP images confirms the above findings. NON-VASCULAR Lower chest: The visualized lung bases are clear. There is coronary vascular calcification. No intra-abdominal free air or free fluid. Hepatobiliary: Diffuse fatty infiltration of the liver. No intrahepatic biliary ductal dilatation. Gallbladder is  mildly distended. Faint focus of high attenuation in the gallbladder neck may represent a vessel although a small stone in the gall bladder neck is not excluded. No pericholecystic fluid.  There is however slight apparent hyperemia gallbladder wall. Correlation with ultrasound is recommended Pancreas: The pancreas is moderately atrophic. There is a 13 x 13 mm hypodense lesion in the body of the pancreas which is not characterized but may represent a side branch IPMN. Further evaluation with MRI on a nonemergent basis recommended. No active inflammatory changes. Spleen: Normal in size without focal abnormality. Adrenals/Urinary Tract: Mild left adrenal thickening. The right adrenal gland is unremarkable. Several scattered punctate nonobstructing bilateral renal calculi versus renal vascular calcification. No hydronephrosis. Subcentimeter left renal upper pole hypodensity is not characterized. The visualized ureters and urinary bladder appear unremarkable. Stomach/Bowel: There is severe sigmoid and diffuse colonic diverticulosis without active inflammatory changes. There is no bowel obstruction or active inflammation. The appendix is normal. Lymphatic: No adenopathy. Reproductive: The prostate and seminal vesicles are grossly unremarkable. Other: There is an area of skin thickening and subcutaneous induration of the upper anterior abdominal wall. No fluid collection. Musculoskeletal: Osteopenia with degenerative changes of the spine. No acute osseous pathology. IMPRESSION: 1. Mildly distended gallbladder with apparent hyperemia of the gallbladder wall. Correlation with ultrasound recommended to exclude acute cholecystitis. 2. Fatty liver. 3. Severe colonic diverticulosis. No bowel obstruction or active inflammation. Normal appendix. 4. A 13 x 13 mm hypodense lesion in the body of the pancreas may represent a side branch IPMN. Further evaluation with MRI on a nonemergent basis recommended. 5. No abdominal aortic  aneurysm dissection. Aortic Atherosclerosis (ICD10-I70.0). Electronically Signed   By: Anner Crete M.D.   On: 06/06/2019 02:16   US Abdomen Limited Ruq  Result Date: 06/06/2019 CLINICAL DATA:  Right upper quadrant pain for 2 days. EXAM: ULTRASOUND ABDOMEN LIMITED RIGHT UPPER QUADRANT COMPARISON:  None. FINDINGS: Gallbladder: A tiny mobile gallstone is seen measuring approximately 5 mm. No evidence of gallbladder wall thickening or pericholecystic fluid. No sonographic Murphy sign noted by sonographer. Common bile duct: Diameter: 2 mm, within normal limits. Liver: No focal lesion identified. Within normal limits in parenchymal echogenicity. Portal vein is patent on color Doppler imaging with normal direction of blood flow towards the liver. Other: None. IMPRESSION: Tiny gallstone. No sonographic evidence of cholecystitis or biliary ductal dilatation. Electronically Signed   By: Marlaine Hind M.D.   On: 06/06/2019 05:21    Impression/Plan: Epigastric pain with N/V and radiation to the back - no signs of bleeding; remote history of peptic ulcer disease. EGD tomorrow to further evaluate and if unrevealing will need an MRCP to further characterize non-specific pancreatic lesion seen on CT angiogram (daughter reports a history of pancreatic "cysts"). Clear liquid diet today. NPO p MN for EGD.    LOS: 0 days   [name]  06/07/2019, 2:44 PM  Questions please call [phone]

## 2019-06-07 NOTE — Consult Note (Signed)
Referring Provider: Dr. Eliseo Squires Primary Care Physician:  [name], MD Primary Gastroenterologist:  Dr. Oletta Lamas  Reason for Consultation:  Epigastric pain  HPI: [name] is a 85 y.o. male with a remote history of peptic ulcer disease in 1970's and 1995 (bleeding ulcer that he attributes to NSAIDs at that time). Reported history of pancreatitis from his daughter (by phone) and wife at bedside. This past Friday he had the acute onset of severe sharp pain across his upper abdomen mainly in the epigastric region along with radiation to his back. Had N/V with the abdominal pain. Denies melena or hematochezia. Denies dysphagia, heartburn, or weight loss. Denies NSAIDs. Hgb 13.7, WBC 19.1. LFTs, lipase wnl. U/S with normal GB and no CBD dilation. Tiny gallstone seen. CT Angio showed non-specific hypodense lesion (13 mm X 13 mm) in pancreatic body without pancreatitis. Mildly distended GB, fatty liver. No abdominal aortic aneurysm seen.  Past Medical History:  Diagnosis Date  . Anemia    related to frequent nosebleeds-right nostril.  . Arthritis    arthritis -back knees, most joints  . Breathing difficulty 11-10-12   difficult to breath if laying posteriorly, right nare difficulty  . Carotid artery occlusion   . Change in hearing   . Diabetes mellitus age 73  . Dysrhythmia 07/2012   hx. Atrial Flutter x1, converted spontaneously-no ploblems.   . H/O hiatal hernia   . HOH (hard of hearing) 11-10-12   bilaterally  . Hyperlipidemia   . Hypertension   . Ulcer 1995   bleeding ulcer, none since    Past Surgical History:  Procedure Laterality Date  . CAROTID DUPLEX  06/30/2012   PATENT RIGHT CAROTID ENDARTERECTOMY. 1%-39% LEFT ICA. STABLE  . CAROTID ENDARTERECTOMY  11/242008   right with Dacron patch angioplasty  . CATARACT EXTRACTION  1998 bilateral  done 3 months apart  . EYE SURGERY    . JOINT REPLACEMENT Right 11-18-12  . KNEE BURSECTOMY  11/18/2012   Procedure: KNEE BURSECTOMY;  Surgeon:  Tobi Bastos, MD;  Location: [facility];  Service: Orthopedics;  Laterality: Right;  . NM MYOCAR MULTIPLE W/SPECT  10/08/2012   NORMAL STRESS NUCLEAR STUDY.  . TOTAL KNEE ARTHROPLASTY  11/18/2012   Procedure: TOTAL KNEE ARTHROPLASTY;  Surgeon: Tobi Bastos, MD;  Location: [facility];  Service: Orthopedics;  Laterality: Right;  . TRANSTHORACIC ECHOCARDIOGRAM  10/08/2012   MODERATE LVH. MODERATE CONCENTRIC HYPERTROPHY.EF 65 TO 70%. GRADE 1 DYSTOLIC DYSFUNCTION. ELEVATED VENTRICULAR END-DIASTOLIC FILLING PRESSURE AND LEFT ATRIAL FILLING PRESSURE. AV- MILD TO MODERATE CALCIFIED ANNULUS. MV- CALCIFIC DEGENERATION.Marland Kitchen LA-MILDLY DILATED.    Prior to Admission medications   Medication Sig Start Date End Date Taking? Authorizing Provider  acetaminophen (TYLENOL) 500 MG tablet Take 500 mg by mouth every 6 (six) hours as needed. Pain   Yes [provider]  atorvastatin (LIPITOR) 80 MG tablet Take 80 mg by mouth every evening.    Yes [provider]  diltiazem (DILACOR XR) 180 MG 24 hr capsule Take 180 mg by mouth daily.   Yes [provider]  gabapentin (NEURONTIN) 100 MG capsule Take 200-400 mg by mouth See admin instructions. 200 mg  in the morning and 400 mg at night   Yes [provider]  gemfibrozil (LOPID) 600 MG tablet Take 600 mg by mouth daily.    Yes [provider]  insulin detemir (LEVEMIR) 100 UNIT/ML injection Inject 35-45 Units into the skin See admin instructions. Takes 35 units in the morning and 35 units  at night   Yes [provider]  losartan (COZAAR) 100 MG tablet Take 100 mg by mouth daily before breakfast.    Yes [provider]  metFORMIN (GLUCOPHAGE) 1000 MG tablet Take 1,000 mg by mouth 2 (two) times daily with a meal.   Yes [provider]  Omega-3 1000 MG CAPS Take 2 g by mouth 2 (two) times daily.   Yes [provider]  omeprazole (PRILOSEC) 20 MG capsule Take 20 mg by mouth daily.   Yes [provider]  traZODone (DESYREL) 100 MG tablet Take 100 mg by mouth at bedtime as needed for sleep.    Yes [provider]    Scheduled Meds: . aspirin EC  81 mg Oral Daily  . atorvastatin  40 mg Oral QPM  . diltiazem  180 mg Oral Daily  . docusate sodium  100 mg Oral BID  . enoxaparin (LOVENOX) injection  40 mg Subcutaneous Q24H  . gabapentin  100 mg Oral TID  . hydrochlorothiazide  25 mg Oral Daily  . insulin aspart  0-5 Units Subcutaneous QHS  . insulin aspart  0-9 Units Subcutaneous TID WC  . [START ON 06/08/2019] insulin detemir  35 Units Subcutaneous q morning - 10a  . losartan  100 mg Oral QAC breakfast  . pantoprazole (PROTONIX) IV  40 mg Intravenous Q12H  . sodium chloride flush  3 mL Intravenous Q12H  . sucralfate  1 g Oral TID WC & HS  . traZODone  100 mg Oral QHS   Continuous Infusions: . sodium chloride    . lactated ringers 75 mL/hr at 06/06/19 2230   PRN Meds:.acetaminophen **OR** acetaminophen, fentaNYL (SUBLIMAZE) injection, ondansetron **OR** ondansetron (ZOFRAN) IV  Allergies as of 06/06/2019  . (No Known Allergies)    Family History  Problem Relation Age of Onset  . Cancer Mother        Pancreatic  . Heart disease Mother   . Cancer Brother        pancreactic  . Cancer Daughter        Breast    Social History   Socioeconomic History  . Marital status: Married    Spouse name: Not on file  . Number of children: Not on file  . Years of education: Not on file  . Highest education level: Not on file  Occupational History  . Occupation: retired  Scientific laboratory technician  . Financial resource strain: Not on file  . Food insecurity    Worry: Not on file    Inability: Not on file  . Transportation needs    Medical: Not on file    Non-medical: Not on file  Tobacco Use  . Smoking status: Former Smoker    Types: Cigarettes    Quit date: 10/21/1976    Years since quitting: 42.6  . Smokeless tobacco: Never Used  Substance and Sexual Activity  .  Alcohol use: Yes    Alcohol/week: 1.0 standard drinks    Types: 1 Glasses of wine per week    Comment: 1-2 alcoholic drinks daily  . Drug use: No  . Sexual activity: Not on file  Lifestyle  . Physical activity    Days per week: Not on file    Minutes per session: Not on file  . Stress: Not on file  Relationships  . Social Herbalist on phone: Not on file    Gets together: Not on file    Attends religious service: Not on file  Active member of club or organization: Not on file    Attends meetings of clubs or organizations: Not on file    Relationship status: Not on file  . Intimate partner violence    Fear of current or ex partner: Not on file    Emotionally abused: Not on file    Physically abused: Not on file    Forced sexual activity: Not on file  Other Topics Concern  . Not on file  Social History Narrative  . Not on file    Review of Systems: All negative except as stated above in HPI.  Physical Exam: Vital signs: Vitals:   06/07/19 0453 06/07/19 0856  BP: (!) 144/72 (!) 142/78  Pulse: 81 73  Resp: 17 18  Temp: (!) 97.5 F (36.4 C) 97.9 F (36.6 C)  SpO2: 92% 95%     General:   Lethargic, elderly, thin, pleasant, no acute distress Head: normocephalic, atraumatic Eyes: anicteric sclera ENT: oropharynx clear Neck: supple, nontender Lungs:  Clear throughout to auscultation.   No wheezes, crackles, or rhonchi. No acute distress. Heart:  Regular rate and rhythm; no murmurs, clicks, rubs,  or gallops. Abdomen: minimal epigastric tenderness without guarding, soft, nondistended, +BS  Rectal:  Deferred Ext: no edema  GI:  Lab Results: Recent Labs    06/06/19 0043 06/07/19 0749  WBC 13.5* 19.1*  HGB 13.6 13.7  HCT 41.4 40.6  PLT 230 230   BMET Recent Labs    06/06/19 0108 06/07/19 0749  NA 137 136  K 3.8 3.5  CL 97* 94*  CO2 24 29  GLUCOSE 215* 146*  BUN 15 13  CREATININE 0.94 1.10  CALCIUM 9.8 9.4   LFT Recent Labs     06/06/19 0108  PROT 7.5  ALBUMIN 4.0  AST 15  ALT 13  ALKPHOS 91  BILITOT 1.1   PT/INR No results for input(s): LABPROT, INR in the last 72 hours.   Studies/Results: Ct Angio Abd/pel W And/or Wo Contrast  Result Date: 06/06/2019 CLINICAL DATA:  85 year old male with abdominal pain and vomiting. EXAM: CTA ABDOMEN AND PELVIS wITHOUT AND WITH CONTRAST TECHNIQUE: Multidetector CT imaging of the abdomen and pelvis was performed using the standard protocol during bolus administration of intravenous contrast. Multiplanar reconstructed images and MIPs were obtained and reviewed to evaluate the vascular anatomy. CONTRAST:  118mL OMNIPAQUE IOHEXOL 350 MG/ML SOLN COMPARISON:  None. FINDINGS: VASCULAR Aorta: There is advanced atherosclerotic calcification. No aneurysmal dilatation or dissection. Celiac: Patent without evidence of aneurysm, dissection, vasculitis or significant stenosis. SMA: Patent without evidence of aneurysm, dissection, vasculitis or significant stenosis. Renals: Atherosclerotic calcification of the renal ostia. The renal arteries are patent. IMA: Patent without evidence of aneurysm, dissection, vasculitis or significant stenosis. Inflow: Patent without evidence of aneurysm, dissection, vasculitis or significant stenosis. Atherosclerotic calcification. Proximal Outflow: Bilateral common femoral and visualized portions of the superficial and profunda femoral arteries are patent without evidence of aneurysm, dissection, vasculitis or significant stenosis. There is atherosclerotic calcification. Veins: No obvious venous abnormality within the limitations of this arterial phase study. Review of the MIP images confirms the above findings. NON-VASCULAR Lower chest: The visualized lung bases are clear. There is coronary vascular calcification. No intra-abdominal free air or free fluid. Hepatobiliary: Diffuse fatty infiltration of the liver. No intrahepatic biliary ductal dilatation. Gallbladder is  mildly distended. Faint focus of high attenuation in the gallbladder neck may represent a vessel although a small stone in the gall bladder neck is not excluded. No pericholecystic fluid.  There is however slight apparent hyperemia gallbladder wall. Correlation with ultrasound is recommended Pancreas: The pancreas is moderately atrophic. There is a 13 x 13 mm hypodense lesion in the body of the pancreas which is not characterized but may represent a side branch IPMN. Further evaluation with MRI on a nonemergent basis recommended. No active inflammatory changes. Spleen: Normal in size without focal abnormality. Adrenals/Urinary Tract: Mild left adrenal thickening. The right adrenal gland is unremarkable. Several scattered punctate nonobstructing bilateral renal calculi versus renal vascular calcification. No hydronephrosis. Subcentimeter left renal upper pole hypodensity is not characterized. The visualized ureters and urinary bladder appear unremarkable. Stomach/Bowel: There is severe sigmoid and diffuse colonic diverticulosis without active inflammatory changes. There is no bowel obstruction or active inflammation. The appendix is normal. Lymphatic: No adenopathy. Reproductive: The prostate and seminal vesicles are grossly unremarkable. Other: There is an area of skin thickening and subcutaneous induration of the upper anterior abdominal wall. No fluid collection. Musculoskeletal: Osteopenia with degenerative changes of the spine. No acute osseous pathology. IMPRESSION: 1. Mildly distended gallbladder with apparent hyperemia of the gallbladder wall. Correlation with ultrasound recommended to exclude acute cholecystitis. 2. Fatty liver. 3. Severe colonic diverticulosis. No bowel obstruction or active inflammation. Normal appendix. 4. A 13 x 13 mm hypodense lesion in the body of the pancreas may represent a side branch IPMN. Further evaluation with MRI on a nonemergent basis recommended. 5. No abdominal aortic  aneurysm dissection. Aortic Atherosclerosis (ICD10-I70.0). Electronically Signed   By: Anner Crete M.D.   On: 06/06/2019 02:16   US Abdomen Limited Ruq  Result Date: 06/06/2019 CLINICAL DATA:  Right upper quadrant pain for 2 days. EXAM: ULTRASOUND ABDOMEN LIMITED RIGHT UPPER QUADRANT COMPARISON:  None. FINDINGS: Gallbladder: A tiny mobile gallstone is seen measuring approximately 5 mm. No evidence of gallbladder wall thickening or pericholecystic fluid. No sonographic Murphy sign noted by sonographer. Common bile duct: Diameter: 2 mm, within normal limits. Liver: No focal lesion identified. Within normal limits in parenchymal echogenicity. Portal vein is patent on color Doppler imaging with normal direction of blood flow towards the liver. Other: None. IMPRESSION: Tiny gallstone. No sonographic evidence of cholecystitis or biliary ductal dilatation. Electronically Signed   By: Marlaine Hind M.D.   On: 06/06/2019 05:21    Impression/Plan: Epigastric pain with N/V and radiation to the back - no signs of bleeding; remote history of peptic ulcer disease. EGD tomorrow to further evaluate and if unrevealing will need an MRCP to further characterize non-specific pancreatic lesion seen on CT angiogram (daughter reports a history of pancreatic "cysts"). Clear liquid diet today. NPO p MN for EGD.    LOS: 0 days   [name]  06/07/2019, 2:44 PM  Questions please call [phone]

## 2019-06-07 NOTE — Progress Notes (Signed)
[facility] PROGRESS NOTE  [name] DUK:[id number] DOB: 1930-04-18 DOA: 06/06/2019 PCP: [name], MD  Assessment/Plan:  Abdominal pain. Etiology uncertain. Reports pain worse during night and a "little better" after clear liquid breakfast. Patient with remote h/o gastritis and ulcer. Initial CT was concerning for cholecystitis, but RUQ shows no ductal dilation and only a "tiny" gallstone. He did receive one dose of Zosyn, but due to low suspicion for infection this was stopped. Concern for gastritis/ulcer. CT angio reveals hypodense lesion in the body of the pancreas.  Evaluated by GI who recommend EGD tomorrow to further evaluate. If unrevealing may need MRCP - continue Protonix IV BID and Carafate -Clear liquids -appreciate gi input -NPO past midnight   PAF The patient reports one prior episode of aflutter/afib for which he saw Dr. Gwenlyn Found. He was never on [facility]. He is rate controlled on Diltiazem. Discussed AC with patient and family. He has hx falls with hitting his head. Declining AC at this time -continue low dose asa -continue dilt  Dehydration Persistent n/v prior to admission led to dehydration. Creatinine within limits of norma. Tolerating clear liquids -gently IV fluids -bmet in am  HTN fair control -Continue Diltiazem, Cozaar  HLD -Continue Lipitor -Hold Lopid - unable to have significant benefit while hospitalized.  DM home meds include levimir BID and metformin. Had episode hypoglycemia  -continue to hold Glucophage -stop pm levemir -decrease day levemir to 35u -Will cover with moderate-scale-scale SSI without qhs coverage   Code Status: dnr Family Communication: family/daughter per phone Disposition Plan: to be determined   Consultants:  schooler GI  Procedures:    Antibiotics:    HPI/Subjective: Awake alert no acute distress. Complains of continued abdominal pain worse at night  Objective: Vitals:   06/07/19 0453 06/07/19 0856   BP: (!) 144/72 (!) 142/78  Pulse: 81 73  Resp: 17 18  Temp: (!) 97.5 F (36.4 C) 97.9 F (36.6 C)  SpO2: 92% 95%    Intake/Output Summary (Last 24 hours) at 06/07/2019 1504 Last data filed at 06/07/2019 1357 Gross per 24 hour  Intake 1680 ml  Output 400 ml  Net 1280 ml   Filed Weights   06/06/19 0025  Weight: 86.2 kg    Exam:   General:  Frail somewhat pale and chronically ill appearing  Cardiovascular: irregularly irregular no mgr no LE edema  Respiratory: normal effort BS clear bilaterally no wheeze  Abdomen: slightly distended +BS mild tenderness epigastric area  Musculoskeletal: joints without erythema or swelling   Data Reviewed: Basic Metabolic Panel: Recent Labs  Lab 06/06/19 0108 06/07/19 0749  NA 137 136  K 3.8 3.5  CL 97* 94*  CO2 24 29  GLUCOSE 215* 146*  BUN 15 13  CREATININE 0.94 1.10  CALCIUM 9.8 9.4   Liver Function Tests: Recent Labs  Lab 06/06/19 0108  AST 15  ALT 13  ALKPHOS 91  BILITOT 1.1  PROT 7.5  ALBUMIN 4.0   Recent Labs  Lab 06/06/19 0108  LIPASE 21   No results for input(s): AMMONIA in the last 168 hours. CBC: Recent Labs  Lab 06/06/19 0043 06/07/19 0749  WBC 13.5* 19.1*  NEUTROABS 11.8*  --   HGB 13.6 13.7  HCT 41.4 40.6  MCV 96.5 95.1  PLT 230 230   Cardiac Enzymes: No results for input(s): CKTOTAL, CKMB, CKMBINDEX, TROPONINI in the last 168 hours. BNP (last 3 results) No results for input(s): BNP in the last 8760 hours.  ProBNP (last 3 results)  No results for input(s): PROBNP in the last 8760 hours.  CBG: Recent Labs  Lab 06/06/19 2108 06/06/19 2154 06/07/19 0450 06/07/19 0649 06/07/19 1113  GLUCAP 50* 79 127* 125* 120*    Recent Results (from the past 240 hour(s))  SARS CORONAVIRUS 2 Nasal Swab Aptima Multi Swab     Status: None   Collection Time: 06/06/19  2:03 AM   Specimen: Aptima Multi Swab; Nasal Swab  Result Value Ref Range Status   SARS Coronavirus 2 NEGATIVE NEGATIVE Final     Comment: (NOTE) SARS-CoV-2 target nucleic acids are NOT DETECTED. The SARS-CoV-2 RNA is generally detectable in upper and lower respiratory specimens during the acute phase of infection. Negative results do not preclude SARS-CoV-2 infection, do not rule out co-infections with other pathogens, and should not be used as the sole basis for treatment or other patient management decisions. Negative results must be combined with clinical observations, patient history, and epidemiological information. The expected result is Negative. Fact Sheet for Patients: SugarRoll.be Fact Sheet for Healthcare Providers: https://www.woods-mathews.com/ This test is not yet approved or cleared by the [place] FDA and  has been authorized for detection and/or diagnosis of SARS-CoV-2 by FDA under an Emergency Use Authorization (EUA). This EUA will remain  in effect (meaning this test can be used) for the duration of the COVID-19 declaration under Section 56 4(b)(1) of the Act, 21 U.S.C. section 360bbb-3(b)(1), unless the authorization is terminated or revoked sooner. Performed at [facility], [place] [address], [place], [place] [address]      Studies: Ct Angio Abd/pel W And/or Wo Contrast  Result Date: 06/06/2019 CLINICAL DATA:  85 year old male with abdominal pain and vomiting. EXAM: CTA ABDOMEN AND PELVIS wITHOUT AND WITH CONTRAST TECHNIQUE: Multidetector CT imaging of the abdomen and pelvis was performed using the standard protocol during bolus administration of intravenous contrast. Multiplanar reconstructed images and MIPs were obtained and reviewed to evaluate the vascular anatomy. CONTRAST:  18mL OMNIPAQUE IOHEXOL 350 MG/ML SOLN COMPARISON:  None. FINDINGS: VASCULAR Aorta: There is advanced atherosclerotic calcification. No aneurysmal dilatation or dissection. Celiac: Patent without evidence of aneurysm, dissection, vasculitis or significant stenosis.  SMA: Patent without evidence of aneurysm, dissection, vasculitis or significant stenosis. Renals: Atherosclerotic calcification of the renal ostia. The renal arteries are patent. IMA: Patent without evidence of aneurysm, dissection, vasculitis or significant stenosis. Inflow: Patent without evidence of aneurysm, dissection, vasculitis or significant stenosis. Atherosclerotic calcification. Proximal Outflow: Bilateral common femoral and visualized portions of the superficial and profunda femoral arteries are patent without evidence of aneurysm, dissection, vasculitis or significant stenosis. There is atherosclerotic calcification. Veins: No obvious venous abnormality within the limitations of this arterial phase study. Review of the MIP images confirms the above findings. NON-VASCULAR Lower chest: The visualized lung bases are clear. There is coronary vascular calcification. No intra-abdominal free air or free fluid. Hepatobiliary: Diffuse fatty infiltration of the liver. No intrahepatic biliary ductal dilatation. Gallbladder is mildly distended. Faint focus of high attenuation in the gallbladder neck may represent a vessel although a small stone in the gall bladder neck is not excluded. No pericholecystic fluid. There is however slight apparent hyperemia gallbladder wall. Correlation with ultrasound is recommended Pancreas: The pancreas is moderately atrophic. There is a 13 x 13 mm hypodense lesion in the body of the pancreas which is not characterized but may represent a side branch IPMN. Further evaluation with MRI on a nonemergent basis recommended. No active inflammatory changes. Spleen: Normal in size without focal abnormality. Adrenals/Urinary  Tract: Mild left adrenal thickening. The right adrenal gland is unremarkable. Several scattered punctate nonobstructing bilateral renal calculi versus renal vascular calcification. No hydronephrosis. Subcentimeter left renal upper pole hypodensity is not characterized.  The visualized ureters and urinary bladder appear unremarkable. Stomach/Bowel: There is severe sigmoid and diffuse colonic diverticulosis without active inflammatory changes. There is no bowel obstruction or active inflammation. The appendix is normal. Lymphatic: No adenopathy. Reproductive: The prostate and seminal vesicles are grossly unremarkable. Other: There is an area of skin thickening and subcutaneous induration of the upper anterior abdominal wall. No fluid collection. Musculoskeletal: Osteopenia with degenerative changes of the spine. No acute osseous pathology. IMPRESSION: 1. Mildly distended gallbladder with apparent hyperemia of the gallbladder wall. Correlation with ultrasound recommended to exclude acute cholecystitis. 2. Fatty liver. 3. Severe colonic diverticulosis. No bowel obstruction or active inflammation. Normal appendix. 4. A 13 x 13 mm hypodense lesion in the body of the pancreas may represent a side branch IPMN. Further evaluation with MRI on a nonemergent basis recommended. 5. No abdominal aortic aneurysm dissection. Aortic Atherosclerosis (ICD10-I70.0). Electronically Signed   By: Anner Crete M.D.   On: 06/06/2019 02:16   US Abdomen Limited Ruq  Result Date: 06/06/2019 CLINICAL DATA:  Right upper quadrant pain for 2 days. EXAM: ULTRASOUND ABDOMEN LIMITED RIGHT UPPER QUADRANT COMPARISON:  None. FINDINGS: Gallbladder: A tiny mobile gallstone is seen measuring approximately 5 mm. No evidence of gallbladder wall thickening or pericholecystic fluid. No sonographic Murphy sign noted by sonographer. Common bile duct: Diameter: 2 mm, within normal limits. Liver: No focal lesion identified. Within normal limits in parenchymal echogenicity. Portal vein is patent on color Doppler imaging with normal direction of blood flow towards the liver. Other: None. IMPRESSION: Tiny gallstone. No sonographic evidence of cholecystitis or biliary ductal dilatation. Electronically Signed   By: Marlaine Hind  M.D.   On: 06/06/2019 05:21    Scheduled Meds: . aspirin EC  81 mg Oral Daily  . atorvastatin  40 mg Oral QPM  . diltiazem  180 mg Oral Daily  . docusate sodium  100 mg Oral BID  . enoxaparin (LOVENOX) injection  40 mg Subcutaneous Q24H  . gabapentin  100 mg Oral TID  . hydrochlorothiazide  25 mg Oral Daily  . insulin aspart  0-5 Units Subcutaneous QHS  . insulin aspart  0-9 Units Subcutaneous TID WC  . [START ON 06/08/2019] insulin detemir  35 Units Subcutaneous q morning - 10a  . losartan  100 mg Oral QAC breakfast  . pantoprazole (PROTONIX) IV  40 mg Intravenous Q12H  . sodium chloride flush  3 mL Intravenous Q12H  . sucralfate  1 g Oral TID WC & HS  . traZODone  100 mg Oral QHS   Continuous Infusions: . sodium chloride    . lactated ringers 75 mL/hr at 06/06/19 2230    Principal Problem:   Abdominal pain Active Problems:   Diabetes (HCC)   AF (paroxysmal atrial fibrillation) (HCC)   Essential hypertension   Dehydration, mild   Hyperlipidemia    Time spent: 45 minutes    [place] NP  [facility]  If 7PM-7AM, please contact night-coverage at [email or web address], password [facility] 06/07/2019, 3:04 PM  LOS: 0 days

## 2019-06-07 NOTE — Progress Notes (Signed)
Occupational Therapy Treatment Patient Details Name: [name] MRN: [id number] DOB: 07-23-1930 Today's Date: 06/07/2019    History of present illness Patient is a 85 y/o male presenting with abdominal pain. Past medical history significant of HTN; HLD; and DM. Admitted for further work-up.    OT comments  Pt continues to present with poor strength and balance; pt reporting he can tell he is weaker. Pt agreeable to therapy and OOB activity. Pt performing BLE exercises supine in bed in preparation for functional mobility. Pt requiring Mod A for bed mobility to EOB and presents with poor sitting balance. Challenging pt's sitting balance at EOB with lateral leans. Pt performing stand pivot to recliner with Min Guard A and RW presenting with slow, small steps. Continue to recommend dc to home with HHOT and will continue to follow acutely as admitted.    Follow Up Recommendations  Home health OT;Supervision/Assistance - 24 hour    Equipment Recommendations  3 in 1 bedside commode    Recommendations for Other Services      Precautions / Restrictions Precautions Precautions: Fall Restrictions Weight Bearing Restrictions: No       Mobility Bed Mobility Overal bed mobility: Needs Assistance Bed Mobility: Supine to Sit     Supine to sit: Mod assist     General bed mobility comments: Mod A to bring hips towards EOB and elevate trunk  Transfers Overall transfer level: Needs assistance Equipment used: Rolling walker (2 wheeled) Transfers: Sit to/from Stand Sit to Stand: Min guard;Min assist;+2 safety/equipment         General transfer comment: light Min A to rise from bedside and chair    Balance Overall balance assessment: Needs assistance Sitting-balance support: No upper extremity supported;Feet supported Sitting balance-Leahy Scale: Poor Sitting balance - Comments: Pt initially poor with posterior lean and lateral lean to the right; transitioning to fair sitting balance with  exercises   Standing balance support: Bilateral upper extremity supported;During functional activity Standing balance-Leahy Scale: Poor Standing balance comment: Relient on UE support                           ADL either performed or assessed with clinical judgement   ADL Overall ADL's : Needs assistance/impaired                         Toilet Transfer: RW;Stand-pivot;Min guard(Simulated to recliner) Toilet Transfer Details (indicate cue type and reason): Min Guard A for safety. Pt taking small, shuffling steps.          Functional mobility during ADLs: Rolling walker;Min guard(stand pivot) General ADL Comments: Pt limited in mobility due to elevated HR in A-fib     Vision       Perception     Praxis      Cognition Arousal/Alertness: Awake/alert Behavior During Therapy: WFL for tasks assessed/performed Overall Cognitive Status: Within Functional Limits for tasks assessed                                 General Comments: HOH and following commands. Pt at times will not respond and pt's wife reporting this is part of his personality        Exercises Exercises: General Lower Extremity;Other exercises General Exercises - Lower Extremity Straight Leg Raises: AAROM;Both;Supine(3 reps; hold for 30 secs) Hip Flexion/Marching: Both;Supine(3 reps; hold for 30 secs each) Other Exercises  Other Exercises: Pt bringing knees towards chest and then rotating at hips to bring kness left and right; 10 reps. Other Exercises: While sitting at EOB, pt laterally leaning to the right ot reach towards bed rail. Challenging sitting balance and righting reactions   Shoulder Instructions       General Comments Wife present throughout    Pertinent Vitals/ Pain       Pain Assessment: Faces Faces Pain Scale: Hurts little more Pain Location: Back and bilateral knees Pain Descriptors / Indicators: Discomfort;Grimacing;Pressure Pain Intervention(s):  Monitored during session;Limited activity within patient's tolerance;Repositioned  Home Living                                          Prior Functioning/Environment              Frequency  Min 2X/week        Progress Toward Goals  OT Goals(current goals can now be found in the care plan section)  Progress towards OT goals: Progressing toward goals  Acute Rehab OT Goals Patient Stated Goal: return home soon OT Goal Formulation: With patient Time For Goal Achievement: 06/20/19 Potential to Achieve Goals: Good ADL Goals Pt Will Perform Upper Body Dressing: with modified independence;sitting Pt Will Perform Lower Body Dressing: with modified independence;sit to/from stand Pt Will Transfer to Toilet: with modified independence;ambulating;regular height toilet(with RW) Pt Will Perform Toileting - Clothing Manipulation and hygiene: with modified independence;sit to/from stand Pt Will Perform Tub/Shower Transfer: with supervision;ambulating;Tub transfer;shower seat;rolling walker  Plan Discharge plan remains appropriate    Co-evaluation                 AM-PAC OT "6 Clicks" Daily Activity     Outcome Measure   Help from another person eating meals?: None Help from another person taking care of personal grooming?: A Little Help from another person toileting, which includes using toliet, bedpan, or urinal?: A Little Help from another person bathing (including washing, rinsing, drying)?: A Little Help from another person to put on and taking off regular upper body clothing?: A Little Help from another person to put on and taking off regular lower body clothing?: A Little 6 Click Score: 19    End of Session Equipment Utilized During Treatment: Rolling walker;Gait belt  OT Visit Diagnosis: Other abnormalities of gait and mobility (R26.89);Muscle weakness (generalized) (M62.81)   Activity Tolerance Patient tolerated treatment well   Patient Left  with family/visitor present;in chair;with chair alarm set;with call bell/phone within reach   Nurse Communication Mobility status        Time: 1638-4665 OT Time Calculation (min): 32 min  Charges: OT General Charges $OT Visit: 1 Visit OT Treatments $Self Care/Home Management : 8-22 mins $Therapeutic Activity: 8-22 mins  Kirra Verga MSOT, OTR/L Acute Rehab Pager: [phone] Office: [place] 06/07/2019, 11:59 AM

## 2019-06-08 ENCOUNTER — Inpatient Hospital Stay (HOSPITAL_COMMUNITY): Payer: Medicare Other | Admitting: Certified Registered Nurse Anesthetist

## 2019-06-08 ENCOUNTER — Inpatient Hospital Stay (HOSPITAL_COMMUNITY): Payer: Medicare Other

## 2019-06-08 ENCOUNTER — Encounter (HOSPITAL_COMMUNITY): Payer: Self-pay | Admitting: Certified Registered Nurse Anesthetist

## 2019-06-08 ENCOUNTER — Encounter (HOSPITAL_COMMUNITY)
Admission: EM | Disposition: A | Discharge status: Home Health | Payer: Self-pay | Source: Home / Self Care | Attending: Internal Medicine

## 2019-06-08 DIAGNOSIS — N179 Acute kidney failure, unspecified: Secondary | ICD-10-CM | POA: Diagnosis not present

## 2019-06-08 DIAGNOSIS — K8020 Calculus of gallbladder without cholecystitis without obstruction: Secondary | ICD-10-CM

## 2019-06-08 DIAGNOSIS — K2970 Gastritis, unspecified, without bleeding: Secondary | ICD-10-CM | POA: Diagnosis present

## 2019-06-08 DIAGNOSIS — R112 Nausea with vomiting, unspecified: Secondary | ICD-10-CM

## 2019-06-08 HISTORY — PX: ESOPHAGOGASTRODUODENOSCOPY (EGD) WITH PROPOFOL: SHX5813

## 2019-06-08 LAB — BASIC METABOLIC PANEL
Anion gap: 12 (ref 5–15)
BUN: 18 mg/dL (ref 8–23)
CO2: 29 mmol/L (ref 22–32)
Calcium: 8.9 mg/dL (ref 8.9–10.3)
Chloride: 91 mmol/L — ABNORMAL LOW (ref 98–111)
Creatinine, Ser: 1.53 mg/dL — ABNORMAL HIGH (ref 0.61–1.24)
GFR calc Af Amer: 47 mL/min — ABNORMAL LOW (ref >60)
GFR calc non Af Amer: 40 mL/min — ABNORMAL LOW (ref >60)
Glucose, Bld: 160 mg/dL — ABNORMAL HIGH (ref 70–99)
Potassium: 3.2 mmol/L — ABNORMAL LOW (ref 3.5–5.1)
Sodium: 132 mmol/L — ABNORMAL LOW (ref 135–145)

## 2019-06-08 LAB — CBC
HCT: 42.9 % (ref 39.0–52.0)
Hemoglobin: 14.2 g/dL (ref 13.0–17.0)
MCH: 31.6 pg (ref 26.0–34.0)
MCHC: 33.1 g/dL (ref 30.0–36.0)
MCV: 95.3 fL (ref 80.0–100.0)
Platelets: 219 10*3/uL (ref 150–400)
RBC: 4.5 MIL/uL (ref 4.22–5.81)
RDW: 13.5 % (ref 11.5–15.5)
WBC: 19.2 10*3/uL — ABNORMAL HIGH (ref 4.0–10.5)
nRBC: 0 % (ref 0.0–0.2)

## 2019-06-08 LAB — GLUCOSE, CAPILLARY
Glucose-Capillary: 153 mg/dL — ABNORMAL HIGH (ref 70–99)
Glucose-Capillary: 167 mg/dL — ABNORMAL HIGH (ref 70–99)
Glucose-Capillary: 179 mg/dL — ABNORMAL HIGH (ref 70–99)
Glucose-Capillary: 95 mg/dL (ref 70–99)
Glucose-Capillary: 82 mg/dL (ref 70–99)

## 2019-06-08 SURGERY — ESOPHAGOGASTRODUODENOSCOPY (EGD) WITH PROPOFOL
Anesthesia: Monitor Anesthesia Care

## 2019-06-08 MED ORDER — PROPOFOL 10 MG/ML IV BOLUS
INTRAVENOUS | Status: DC | PRN
  Administered 2019-06-08: 10 mg via INTRAVENOUS
  Administered 2019-06-08: 20 mg via INTRAVENOUS

## 2019-06-08 MED ORDER — LIDOCAINE 2% (20 MG/ML) 5 ML SYRINGE
INTRAMUSCULAR | Status: DC | PRN
  Administered 2019-06-08: 100 mg via INTRAVENOUS

## 2019-06-08 MED ORDER — POTASSIUM CHLORIDE CRYS ER 20 MEQ PO TBCR
40.0000 meq | EXTENDED_RELEASE_TABLET | Freq: Once | ORAL | Status: AC
  Administered 2019-06-08: 40 meq via ORAL
  Filled 2019-06-08 (×2): qty 2

## 2019-06-08 MED ORDER — DEXTROSE-NACL 5-0.9 % IV SOLN
INTRAVENOUS | Status: DC
  Administered 2019-06-08 – 2019-06-12 (×7): via INTRAVENOUS

## 2019-06-08 MED ORDER — LACTATED RINGERS IV SOLN
INTRAVENOUS | Status: DC
  Administered 2019-06-08: 08:00:00 via INTRAVENOUS

## 2019-06-08 MED ORDER — GADOBUTROL 1 MMOL/ML IV SOLN
9.0000 mL | Freq: Once | INTRAVENOUS | Status: AC | PRN
  Administered 2019-06-08: 9 mL via INTRAVENOUS

## 2019-06-08 MED ORDER — PROPOFOL 500 MG/50ML IV EMUL
INTRAVENOUS | Status: DC | PRN
  Administered 2019-06-08: 75 ug/kg/min via INTRAVENOUS

## 2019-06-08 SURGICAL SUPPLY — 15 items

## 2019-06-08 NOTE — Progress Notes (Signed)
PT Cancellation Note  Patient Details Name: [name] MRN: [id number] DOB: 01-Mar-1930   Cancelled Treatment:    Reason Eval/Treat Not Completed: Patient at procedure or test/unavailable   Currently at Endo;   Will follow up later today as time allows;  Otherwise, will follow up for PT tomorrow;   Thank you,  Roney Marion, PT  Acute Rehabilitation Services Pager [phone] Office [phone]     [place] 06/08/2019, 8:28 AM

## 2019-06-08 NOTE — Anesthesia Procedure Notes (Signed)
Procedure Name: MAC Date/Time: 06/08/2019 8:15 AM Performed by: Colin Benton, CRNA Pre-anesthesia Checklist: Patient identified, Emergency Drugs available, Suction available and Patient being monitored Patient Re-evaluated:Patient Re-evaluated prior to induction Oxygen Delivery Method: Simple face mask Preoxygenation: Pre-oxygenation with 100% oxygen Induction Type: IV induction Placement Confirmation: positive ETCO2 Dental Injury: Teeth and Oropharynx as per pre-operative assessment

## 2019-06-08 NOTE — Progress Notes (Addendum)
[facility] PROGRESS NOTE  [name] TMH:[id number] DOB: 12-Oct-1930 DOA: 06/06/2019 PCP: [name], MD  Assessment/Plan:  Abdominal pain. Etiology uncertain. EGD today reveals mild gastritis otherwise normal EGD and per GI no source of abdominal pain seen.max temp 100.6.  GI recommending MRI/MRCP. CT angio reveals hypodense lesion in the body of the pancreas. If mrcp unrevealing may need to consider updated colonoscopy if pain persists.  - continue Protonix IV BID and Carafate -will obtain chest xray -Clear liquids -appreciate gi input -MRCP pending    PAF  Fair rate control. The patient reports one prior episode of aflutter/afib for which he saw Dr. Gwenlyn Found. He was never on [facility]. He is rate controlled on Diltiazem. Discussed AC with patient and family. He has hx falls with hitting his head. Declining AC at this time -resume low dose asa when work up complete -continue dilt    Acute kidney injury/Dehydration Persistent n/v prior to admission led to dehydration.  -change  IV fluids -hold nephrotoxins -monitor urine output -bmet in am   HTN fair control -Continue Diltiazem -hold hctz and arb secondary to above   HLD -Continue Lipitor -Hold Lopid - unable to have significant benefit while hospitalized.   DM home meds include levimir BID and metformin. Had episode hypoglycemia 8/17.  -continue to hold Glucophage -stop pm levemir -decrease day levemir to 35u -Will cover with moderate-scale-scale SSI without qhs coverage      Code Status: full Family Communication: patient Disposition Plan: home when ready   Consultants: schooler  Procedures: EGD 06/08/19  Antibiotics:   HPI/Subjective: Awake alert no acute distress  Objective: Vitals:   06/08/19 0841 06/08/19 0901  BP: 139/74 (!) 146/64  Pulse: (!) 110 92  Resp: (!) 21   Temp:  98.9 F (37.2 C)  SpO2: 93% 91%    Intake/Output Summary (Last 24 hours) at 06/08/2019 1251 Last data filed at 06/08/2019  1100 Gross per 24 hour  Intake 1901.23 ml  Output 600 ml  Net 1301.23 ml   Filed Weights   06/06/19 0025  Weight: 86.2 kg    Exam:  General:  Awake alert oriented  Cardiovascular: rrr no mgr no LE edema Respiratory: normal effort BS clear bilaterally no wheeze Abdomen: soft +BS no guarding or rebounding Musculoskeletal: joints without swelling/erthema   Data Reviewed: Basic Metabolic Panel: Recent Labs  Lab 06/06/19 0108 06/07/19 0749 06/08/19 0655  NA 137 136 132*  K 3.8 3.5 3.2*  CL 97* 94* 91*  CO2 24 29 29   GLUCOSE 215* 146* 160*  BUN 15 13 18   CREATININE 0.94 1.10 1.53*  CALCIUM 9.8 9.4 8.9   Liver Function Tests: Recent Labs  Lab 06/06/19 0108  AST 15  ALT 13  ALKPHOS 91  BILITOT 1.1  PROT 7.5  ALBUMIN 4.0   Recent Labs  Lab 06/06/19 0108  LIPASE 21   No results for input(s): AMMONIA in the last 168 hours. CBC: Recent Labs  Lab 06/06/19 0043 06/07/19 0749 06/08/19 0655  WBC 13.5* 19.1* 19.2*  NEUTROABS 11.8*  --   --   HGB 13.6 13.7 14.2  HCT 41.4 40.6 42.9  MCV 96.5 95.1 95.3  PLT 230 230 219   Cardiac Enzymes: No results for input(s): CKTOTAL, CKMB, CKMBINDEX, TROPONINI in the last 168 hours. BNP (last 3 results) No results for input(s): BNP in the last 8760 hours.  ProBNP (last 3 results) No results for input(s): PROBNP in the last 8760 hours.  CBG: Recent Labs  Lab 06/07/19  2252 06/07/19 2337 06/08/19 0336 06/08/19 0903 06/08/19 1218  GLUCAP 53* 89 153* 167* 179*    Recent Results (from the past 240 hour(s))  SARS CORONAVIRUS 2 Nasal Swab Aptima Multi Swab     Status: None   Collection Time: 06/06/19  2:03 AM   Specimen: Aptima Multi Swab; Nasal Swab  Result Value Ref Range Status   SARS Coronavirus 2 NEGATIVE NEGATIVE Final    Comment: (NOTE) SARS-CoV-2 target nucleic acids are NOT DETECTED. The SARS-CoV-2 RNA is generally detectable in upper and lower respiratory specimens during the acute phase of infection.  Negative results do not preclude SARS-CoV-2 infection, do not rule out co-infections with other pathogens, and should not be used as the sole basis for treatment or other patient management decisions. Negative results must be combined with clinical observations, patient history, and epidemiological information. The expected result is Negative. Fact Sheet for Patients: SugarRoll.be Fact Sheet for Healthcare Providers: https://www.woods-mathews.com/ This test is not yet approved or cleared by the [place] FDA and  has been authorized for detection and/or diagnosis of SARS-CoV-2 by FDA under an Emergency Use Authorization (EUA). This EUA will remain  in effect (meaning this test can be used) for the duration of the COVID-19 declaration under Section 56 4(b)(1) of the Act, 21 U.S.C. section 360bbb-3(b)(1), unless the authorization is terminated or revoked sooner. Performed at [facility], [place] [address] [place], [place] [address]      Studies: No results found.  Scheduled Meds:  atorvastatin  40 mg Oral QPM   diltiazem  180 mg Oral Daily   docusate sodium  100 mg Oral BID   enoxaparin (LOVENOX) injection  40 mg Subcutaneous Q24H   gabapentin  100 mg Oral TID   insulin aspart  0-5 Units Subcutaneous QHS   insulin aspart  0-9 Units Subcutaneous TID WC   insulin detemir  35 Units Subcutaneous q morning - 10a   pantoprazole (PROTONIX) IV  40 mg Intravenous Q12H   sodium chloride flush  3 mL Intravenous Q12H   sucralfate  1 g Oral TID WC & HS   traZODone  100 mg Oral QHS   Continuous Infusions:  dextrose 5 % and 0.9% NaCl 75 mL/hr at 06/08/19 1242    Principal Problem:   Abdominal pain Active Problems:   Diabetes (HCC)   AF (paroxysmal atrial fibrillation) (HCC)   Gastritis   Essential hypertension   Dehydration, mild   Pancreatic lesion   Acute kidney injury ([place])   Hyperlipidemia    Time spent: 40  minutes    [facility] M NP  [facility]  If 7PM-7AM, please contact night-coverage at [email or web address], password [facility] 06/08/2019, 12:51 PM  LOS: 1 day

## 2019-06-08 NOTE — Anesthesia Postprocedure Evaluation (Signed)
Anesthesia Post Note  Patient: Kastiel Ogden  Procedure(s) Performed: ESOPHAGOGASTRODUODENOSCOPY (EGD) WITH PROPOFOL (N/A )     Patient location during evaluation: PACU Anesthesia Type: MAC Level of consciousness: awake and alert Pain management: pain level controlled Vital Signs Assessment: post-procedure vital signs reviewed and stable Respiratory status: spontaneous breathing, nonlabored ventilation, respiratory function stable and patient connected to nasal cannula oxygen Cardiovascular status: stable and blood pressure returned to baseline Postop Assessment: no apparent nausea or vomiting Anesthetic complications: no    Last Vitals:  Vitals:   06/08/19 0841 06/08/19 0901  BP: 139/74 (!) 146/64  Pulse: (!) 110 92  Resp: (!) 21   Temp:  37.2 C  SpO2: 93% 91%    Last Pain:  Vitals:   06/08/19 1000  TempSrc:   PainSc: 0-No pain                 Alfreida Steffenhagen DAVID

## 2019-06-08 NOTE — Anesthesia Preprocedure Evaluation (Signed)
Anesthesia Evaluation  Patient identified by MRN, date of birth, ID band Patient awake    Reviewed: Allergy & Precautions, NPO status , Patient's Chart, lab work & pertinent test results  Airway Mallampati: I  TM Distance: >3 FB Neck ROM: Full    Dental   Pulmonary former smoker,    Pulmonary exam normal        Cardiovascular hypertension, Pt. on medications Normal cardiovascular exam     Neuro/Psych    GI/Hepatic   Endo/Other  diabetes, Type 2, Insulin Dependent  Renal/GU      Musculoskeletal   Abdominal   Peds  Hematology   Anesthesia Other Findings   Reproductive/Obstetrics                             Anesthesia Physical Anesthesia Plan  ASA: III  Anesthesia Plan: MAC   Post-op Pain Management:    Induction: Intravenous  PONV Risk Score and Plan: 1  Airway Management Planned: Simple Face Mask  Additional Equipment:   Intra-op Plan:   Post-operative Plan: Extubation in OR  Informed Consent: I have reviewed the patients History and Physical, chart, labs and discussed the procedure including the risks, benefits and alternatives for the proposed anesthesia with the patient or authorized representative who has indicated his/her understanding and acceptance.       Plan Discussed with: CRNA and Surgeon  Anesthesia Plan Comments:         Anesthesia Quick Evaluation

## 2019-06-08 NOTE — Transfer of Care (Signed)
Immediate Anesthesia Transfer of Care Note  Patient: [name]  Procedure(s) Performed: ESOPHAGOGASTRODUODENOSCOPY (EGD) WITH PROPOFOL (N/A )  Patient Location: Endoscopy Unit  Anesthesia Type:MAC  Level of Consciousness: awake, alert , oriented and patient cooperative  Airway & Oxygen Therapy: Patient Spontanous Breathing and Patient connected to face mask oxygen  Post-op Assessment: Report given to RN and Post -op Vital signs reviewed and stable  Post vital signs: Reviewed and stable  Last Vitals:  Vitals Value Taken Time  BP 133/83 06/08/19 0831  Temp 37.1 C 06/08/19 0831  Pulse 112 06/08/19 0833  Resp 24 06/08/19 0833  SpO2 100 % 06/08/19 0833  Vitals shown include unvalidated device data.  Last Pain:  Vitals:   06/08/19 0831  TempSrc: Oral  PainSc: 0-No pain      Patients Stated Pain Goal: 0 (10/13/81 5003)  Complications: No apparent anesthesia complications

## 2019-06-08 NOTE — Brief Op Note (Addendum)
Mild gastritis otherwise normal EGD. No source of abdominal pain seen. Will need additional imaging of the pancreas to further evaluate the hypodense lesion seen on the CT angiogram. MRI/MRCP ordered if radiology able to do despite his knee replacement history. Clear liquid diet. If MRI is unrevealing, then will need an updated colonoscopy to further evaluate the abdominal pain if it persists.

## 2019-06-08 NOTE — Op Note (Signed)
[facility] Patient Name: [name] Procedure Date : 06/08/2019 MRN: [id number] Attending MD: [name] , MD Date of Birth: [birth year] CSN: [id number] Age: 85 Admit Type: Inpatient Procedure:                Upper GI endoscopy Indications:              Epigastric abdominal pain Providers:                [name], MD, Raynelle Bring, RN, Elspeth Cho Tech., Technician, Pearline Cables, CRNA,                            Kaylyn Layer, CRNA Referring MD:             hospital team Medicines:                Propofol per Anesthesia, Monitored Anesthesia Care Complications:            No immediate complications. Estimated Blood Loss:     Estimated blood loss: none. Procedure:                Pre-Anesthesia Assessment:                           - Prior to the procedure, a History and Physical                            was performed, and patient medications and                            allergies were reviewed. The patient's tolerance of                            previous anesthesia was also reviewed. The risks                            and benefits of the procedure and the sedation                            options and risks were discussed with the patient.                            All questions were answered, and informed consent                            was obtained. Prior Anticoagulants: The patient has                            taken no previous anticoagulant or antiplatelet                            agents. ASA Grade Assessment: III - A patient with  severe systemic disease. After reviewing the risks                            and benefits, the patient was deemed in                            satisfactory condition to undergo the procedure.                           After obtaining informed consent, the endoscope was                            passed under direct vision. Throughout the                  procedure, the patient's blood pressure, pulse, and                            oxygen saturations were monitored continuously. The                            GIF-H190 ([id number] Olympus gastroscope was                            introduced through the mouth, and advanced to the                            second part of duodenum. The upper GI endoscopy was                            accomplished without difficulty. The patient                            tolerated the procedure well. Scope In: Scope Out: Findings:      The examined esophagus was normal.      Segmental mild inflammation characterized by congestion (edema),       erythema and linear erosions was found in the gastric body, in the       gastric antrum and in the prepyloric region of the stomach.      The exam of the stomach was otherwise normal.      The examined duodenum was normal. Impression:               - Normal esophagus.                           - Acute gastritis.                           - Normal examined duodenum.                           - No specimens collected. Recommendation:           - Clear liquid diet.                           - Observe patient's clinical course.  Procedure Code(s):        --- Professional ---                           [phone], Esophagogastroduodenoscopy, flexible,                            transoral; diagnostic, including collection of                            specimen(s) by brushing or washing, when performed                            (separate procedure) Diagnosis Code(s):        --- Professional ---                           R10.13, Epigastric pain                           K29.00, Acute gastritis without bleeding CPT copyright 2019 American Medical Association. All rights reserved. The codes documented in this report are preliminary and upon coder review may  be revised to meet current compliance requirements. [name], MD 06/08/2019 8:40:22 AM This  report has been signed electronically. Number of Addenda: 0

## 2019-06-08 NOTE — Interval H&P Note (Signed)
History and Physical Interval Note:  06/08/2019 7:58 AM  [name]  has presented today for surgery, with the diagnosis of Epigastric pain.  The various methods of treatment have been discussed with the patient and family. After consideration of risks, benefits and other options for treatment, the patient has consented to  Procedure(s): ESOPHAGOGASTRODUODENOSCOPY (EGD) WITH PROPOFOL (N/A) as a surgical intervention.  The patient's history has been reviewed, patient examined, no change in status, stable for surgery.  I have reviewed the patient's chart and labs.  Questions were answered to the patient's satisfaction.     [name]

## 2019-06-09 ENCOUNTER — Inpatient Hospital Stay (HOSPITAL_COMMUNITY): Payer: Medicare Other

## 2019-06-09 DIAGNOSIS — K2970 Gastritis, unspecified, without bleeding: Secondary | ICD-10-CM

## 2019-06-09 DIAGNOSIS — E1169 Type 2 diabetes mellitus with other specified complication: Secondary | ICD-10-CM

## 2019-06-09 DIAGNOSIS — K869 Disease of pancreas, unspecified: Secondary | ICD-10-CM

## 2019-06-09 DIAGNOSIS — E785 Hyperlipidemia, unspecified: Secondary | ICD-10-CM

## 2019-06-09 LAB — BASIC METABOLIC PANEL
Anion gap: 10 (ref 5–15)
BUN: 17 mg/dL (ref 8–23)
CO2: 27 mmol/L (ref 22–32)
Calcium: 8.5 mg/dL — ABNORMAL LOW (ref 8.9–10.3)
Chloride: 98 mmol/L (ref 98–111)
Creatinine, Ser: 1.28 mg/dL — ABNORMAL HIGH (ref 0.61–1.24)
GFR calc Af Amer: 58 mL/min — ABNORMAL LOW (ref >60)
GFR calc non Af Amer: 50 mL/min — ABNORMAL LOW (ref >60)
Glucose, Bld: 164 mg/dL — ABNORMAL HIGH (ref 70–99)
Potassium: 3.1 mmol/L — ABNORMAL LOW (ref 3.5–5.1)
Sodium: 135 mmol/L (ref 135–145)

## 2019-06-09 LAB — CBC
HCT: 39.4 % (ref 39.0–52.0)
Hemoglobin: 13 g/dL (ref 13.0–17.0)
MCH: 31.7 pg (ref 26.0–34.0)
MCHC: 33 g/dL (ref 30.0–36.0)
MCV: 96.1 fL (ref 80.0–100.0)
Platelets: 229 10*3/uL (ref 150–400)
RBC: 4.1 MIL/uL — ABNORMAL LOW (ref 4.22–5.81)
RDW: 13.4 % (ref 11.5–15.5)
WBC: 15.4 10*3/uL — ABNORMAL HIGH (ref 4.0–10.5)
nRBC: 0 % (ref 0.0–0.2)

## 2019-06-09 LAB — GLUCOSE, CAPILLARY
Glucose-Capillary: 127 mg/dL — ABNORMAL HIGH (ref 70–99)
Glucose-Capillary: 177 mg/dL — ABNORMAL HIGH (ref 70–99)
Glucose-Capillary: 170 mg/dL — ABNORMAL HIGH (ref 70–99)
Glucose-Capillary: 21 mg/dL — CL (ref 70–99)
Glucose-Capillary: 180 mg/dL — ABNORMAL HIGH (ref 70–99)

## 2019-06-09 MED ORDER — MORPHINE SULFATE (PF) 2 MG/ML IV SOLN
INTRAVENOUS | Status: AC
  Administered 2019-06-09: 3 mg via INTRAVENOUS
  Filled 2019-06-09: qty 2

## 2019-06-09 MED ORDER — PIPERACILLIN-TAZOBACTAM 3.375 G IVPB
3.3750 g | Freq: Three times a day (TID) | INTRAVENOUS | Status: DC
  Administered 2019-06-09 – 2019-06-15 (×16): 3.375 g via INTRAVENOUS
  Filled 2019-06-09 (×19): qty 50

## 2019-06-09 MED ORDER — MORPHINE SULFATE 4 MG/ML IJ SOLN: 3.0000 mg | Freq: Once | INTRAMUSCULAR | Status: AC

## 2019-06-09 NOTE — Progress Notes (Signed)
[name] Gastroenterology Progress Note  [name] 85 y.o. 1930-08-25   Subjective: Mild abdominal pain. Feels ok. Wife at bedside.  Objective: Vital signs: Vitals:   06/09/19 0356 06/09/19 0955  BP: (!) 144/62 119/70  Pulse: 77 (!) 103  Resp: 18 18  Temp: 98.6 F (37 C) 99.3 F (37.4 C)  SpO2: 95% 96%    Physical Exam: Gen: elderly, hard of hearing, frail, lethargic, pleasant, no acute distress  HEENT: anicteric sclera CV: RRR Chest: CTA B Abd: diffuse tenderness with guarding, soft, nondistended, +BS Ext: no edema  Lab Results: Recent Labs    06/08/19 0655 06/09/19 0814  NA 132* 135  K 3.2* 3.1*  CL 91* 98  CO2 29 27  GLUCOSE 160* 164*  BUN 18 17  CREATININE 1.53* 1.28*  CALCIUM 8.9 8.5*   No results for input(s): AST, ALT, ALKPHOS, BILITOT, PROT, ALBUMIN in the last 72 hours. Recent Labs    06/08/19 0655 06/09/19 0814  WBC 19.2* 15.4*  HGB 14.2 13.0  HCT 42.9 39.4  MCV 95.3 96.1  PLT 219 229      Assessment/Plan: Abdominal pain with Acute cholecystitis seen on MRCP (U/S negative for cholecystitis). 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 Discussed with daughter on phone in room and she and her mother with discuss further with him but she said in the past he did not want any treatment done for cystic pancreatic lesions that were seen in his pancreas. HIDA scan ordered to further evaluate for cholecystitis and will need a surgery consult following HIDA scan results. Supportive care.   [name] 06/09/2019, 10:46 AM  Questions please call [phone] ID: [name], male   DOB: 06-Jul-1930, 85 y.o.   MRN: [id number]

## 2019-06-09 NOTE — Progress Notes (Signed)
CRITICAL VALUE ALERT  Critical Value: CBG:21  Date & Time Notied: 06/09/19 1654  Orders Received/Actions taken: 1 Amp D50 given along with 120 oz. Of cranberry juice given. No hypoglycemic symptoms reported or observed. CBG now 170. MD notified and made aware.

## 2019-06-09 NOTE — Progress Notes (Signed)
PROGRESS NOTE    [name]  ONG:[id number] DOB: [birth year] DOA: 06/06/2019 PCP: [name], MD   Brief Narrative:  HPI on 06/06/2019 by Dr. Karmen Bongo Isais Klipfel is a 85 y.o. male with medical history significant of HTN; HLD; and DM presenting as transfer from [facility] for n/v.  He developed abdominal pain yesterday before lunch.  It worsened and he developed n/v.  Epigastric TTP.  His last emesis was last night about 11PM.  He is still having pain.  He had a normal BM yesterday AM.  He does not have chest pain, no palpitations.  Denies known h/o afib.  I spoke with his wife.  He had knee replacement in 2014.  Dr. Gwenlyn Found did a pre-op examination and he did have afib at that time.  He is taking Diltiazem.   His wife has afib and was previously on Plavix but now has a pacer.  He has never been on [facility]  His wife requests that I discuss AC with their RN daughter, Tavoris Brisk, teaches nursing at [employer], [phone].   He has a h/o gastritis and pancreatitis.  He did a bleeding ulcer in 1978.  The last time he fell, he hit his head hard and hard a big gash on his head.  He felt like his hip gave out, which has happened before.  She would like to discuss this further with her parents and siblings and will let us know. Assessment & Plan   Abdominal pain/acute cholecystitis -Gastroenterology consulted and appreciated -EGD showed mild gastritis otherwise normal EGD -CTA A/P reveals hypodense lesion in the body of pancreas -GI recommended MRCP-positive for acute cholecystitis.  No evidence of biliary ductal dilatation or choledocholithiasis.  2.3 cm complex cystic lesion in the pancreatic tail with enhancing central nodule consistent with cystic pancreatic neoplasm -GI now recommends HIDA scan with conservative management. -Continue PPI, pain control, antiemetics, IV fluid  Paroxysmal atrial fibrillation -Patient states he has seen Dr. Alvester Chou in the past -Currently not on anticoagulation due  to history of falls and hitting his head -Currently rate controlled on diltiazem  Acute kidney injury/dehydration -Likely due to persistent nausea and vomiting prior to admission -Continue IV fluids  Essential hypertension -Continue Cardizem -HCTZ held  Hyperlipidemia -Continue statin  Diabetes mellitus, type II -Continue Levemir, insulin sliding scale with CBG monitoring -Metformin held  DVT Prophylaxis Lovenox  Code Status: DNR  Family Communication: None at bedside  Disposition Plan: Admitted.  Pending HIDA scan and further GI recommendations  Consultants Gastroenterology  Procedures  EGD MRCP  Antibiotics   Anti-infectives (From admission, onward)   Start     Dose/Rate Route Frequency Ordered Stop   06/06/19 0730  piperacillin-tazobactam (ZOSYN) IVPB 3.375 g     3.375 g 100 mL/hr over 30 Minutes Intravenous  Once 06/06/19 0715 06/06/19 6644      Subjective:   Maxamus Coyne seen and examined today.  Continues to complain of abdominal pain with some nausea.  Denies chest pain or shortness of breath, dizziness or headache. Objective:   Vitals:   06/08/19 1635 06/08/19 2220 06/09/19 0356 06/09/19 0955  BP: (!) 145/58 (!) 113/53 (!) 144/62 119/70  Pulse: 100 80 77 (!) 103  Resp: 18 18 18 18   Temp: 100 F (37.8 C) 99 F (37.2 C) 98.6 F (37 C) 99.3 F (37.4 C)  TempSrc: Oral Oral Oral Tympanic  SpO2: 92% 95% 95% 96%  Weight:      Height:  Intake/Output Summary (Last 24 hours) at 06/09/2019 1405 Last data filed at 06/09/2019 1223 Gross per 24 hour  Intake 2725.25 ml  Output 250 ml  Net 2475.25 ml   Filed Weights   06/06/19 0025  Weight: 86.2 kg    Exam  General: Well developed, well nourished, NAD, appears stated age  66: NCAT, mucous membranes moist.   Cardiovascular: S1 S2 auscultated, irregular  Respiratory: Clear to auscultation bilaterally with equal chest rise  Abdomen: Soft, diffusely TTP, nondistended, + bowel  sounds  Extremities: warm dry without cyanosis clubbing or edema  Neuro: AAOx3, hard of hearing otherwise nonfocal  Psych: Pleasant, appropriate mood and affect   Data Reviewed: I have personally reviewed following labs and imaging studies  CBC: Recent Labs  Lab 06/06/19 0043 06/07/19 0749 06/08/19 0655 06/09/19 0814  WBC 13.5* 19.1* 19.2* 15.4*  NEUTROABS 11.8*  --   --   --   HGB 13.6 13.7 14.2 13.0  HCT 41.4 40.6 42.9 39.4  MCV 96.5 95.1 95.3 96.1  PLT 230 230 219 659   Basic Metabolic Panel: Recent Labs  Lab 06/06/19 0108 06/07/19 0749 06/08/19 0655 06/09/19 0814  NA 137 136 132* 135  K 3.8 3.5 3.2* 3.1*  CL 97* 94* 91* 98  CO2 24 29 29 27   GLUCOSE 215* 146* 160* 164*  BUN 15 13 18 17   CREATININE 0.94 1.10 1.53* 1.28*  CALCIUM 9.8 9.4 8.9 8.5*   GFR: Estimated Creatinine Clearance: 44.6 mL/min (A) (by C-G formula based on SCr of 1.28 mg/dL (H)). Liver Function Tests: Recent Labs  Lab 06/06/19 0108  AST 15  ALT 13  ALKPHOS 91  BILITOT 1.1  PROT 7.5  ALBUMIN 4.0   Recent Labs  Lab 06/06/19 0108  LIPASE 21   No results for input(s): AMMONIA in the last 168 hours. Coagulation Profile: No results for input(s): INR, PROTIME in the last 168 hours. Cardiac Enzymes: No results for input(s): CKTOTAL, CKMB, CKMBINDEX, TROPONINI in the last 168 hours. BNP (last 3 results) No results for input(s): PROBNP in the last 8760 hours. HbA1C: Recent Labs    06/06/19 1720  HGBA1C 7.0*   CBG: Recent Labs  Lab 06/08/19 1218 06/08/19 1633 06/08/19 2219 06/09/19 0658 06/09/19 1145  GLUCAP 179* 95 82 127* 177*   Lipid Profile: No results for input(s): CHOL, HDL, LDLCALC, TRIG, CHOLHDL, LDLDIRECT in the last 72 hours. Thyroid Function Tests: No results for input(s): TSH, T4TOTAL, FREET4, T3FREE, THYROIDAB in the last 72 hours. Anemia Panel: No results for input(s): VITAMINB12, FOLATE, FERRITIN, TIBC, IRON, RETICCTPCT in the last 72 hours. Urine  analysis:    Component Value Date/Time   COLORURINE YELLOW 06/06/2019 0043   APPEARANCEUR CLEAR 06/06/2019 0043   LABSPEC >1.030 (H) 06/06/2019 0043   PHURINE 6.5 06/06/2019 0043   GLUCOSEU >=500 (A) 06/06/2019 0043   HGBUR TRACE (A) 06/06/2019 0043   BILIRUBINUR NEGATIVE 06/06/2019 0043   KETONESUR 40 (A) 06/06/2019 0043   PROTEINUR >300 (A) 06/06/2019 0043   UROBILINOGEN 0.2 11/10/2012 1432   NITRITE NEGATIVE 06/06/2019 0043   LEUKOCYTESUR NEGATIVE 06/06/2019 0043   Sepsis Labs: @LABRCNTIP (procalcitonin:4,lacticidven:4)  ) Recent Results (from the past 240 hour(s))  SARS CORONAVIRUS 2 Nasal Swab Aptima Multi Swab     Status: None   Collection Time: 06/06/19  2:03 AM   Specimen: Aptima Multi Swab; Nasal Swab  Result Value Ref Range Status   SARS Coronavirus 2 NEGATIVE NEGATIVE Final    Comment: (NOTE) SARS-CoV-2 target nucleic acids  are NOT DETECTED. The SARS-CoV-2 RNA is generally detectable in upper and lower respiratory specimens during the acute phase of infection. Negative results do not preclude SARS-CoV-2 infection, do not rule out co-infections with other pathogens, and should not be used as the sole basis for treatment or other patient management decisions. Negative results must be combined with clinical observations, patient history, and epidemiological information. The expected result is Negative. Fact Sheet for Patients: SugarRoll.be Fact Sheet for Healthcare Providers: https://www.woods-mathews.com/ This test is not yet approved or cleared by the [place] FDA and  has been authorized for detection and/or diagnosis of SARS-CoV-2 by FDA under an Emergency Use Authorization (EUA). This EUA will remain  in effect (meaning this test can be used) for the duration of the COVID-19 declaration under Section 56 4(b)(1) of the Act, 21 U.S.C. section 360bbb-3(b)(1), unless the authorization is terminated or revoked  sooner. Performed at [facility], [place] [address], [place], [place] [address]       Radiology Studies: Dg Chest 2 View  Result Date: 06/08/2019 CLINICAL DATA:  Fever. EXAM: CHEST - 2 VIEW COMPARISON:  January 10, 2017 FINDINGS: The cardiac silhouette is mildly enlarged. Calcific atherosclerotic disease of the aorta. Scattered peribronchial airspace opacities in the left lower and mid thorax. No definite pleural effusion. Chronic mild compression deformity of one of the midthoracic vertebral bodies, likely T7, stable from the prior radiograph. Soft tissues are grossly normal. IMPRESSION: Scattered peribronchial airspace opacities with central predominance in the left lower and mid thorax may represent early bronchopneumonia or developing pulmonary vascular congestion. Electronically Signed   By: Fidela Salisbury M.D.   On: 06/08/2019 15:15   Mr 3d Recon At Scanner  Result Date: 06/09/2019 CLINICAL DATA:  Abdominal pain. Pancreatic lesion seen on recent abdomen CTA. EXAM: MRI ABDOMEN WITHOUT AND WITH CONTRAST (INCLUDING MRCP) TECHNIQUE: Multiplanar multisequence MR imaging of the abdomen was performed both before and after the administration of intravenous contrast. Heavily T2-weighted images of the biliary and pancreatic ducts were obtained, and three-dimensional MRCP images were rendered by post processing. CONTRAST:  9 mL Gadavist COMPARISON:  Abdomen CT on 06/06/2019 FINDINGS: Lower chest: Mild bibasilar atelectasis and tiny right pleural effusion. Hepatobiliary: No hepatic masses identified. No evidence of steatosis. The gallbladder is distended with a small gallstone seen in the neck. Diffuse gallbladder wall thickening and contrast enhancement are seen, consistent with acute cholecystitis. No evidence biliary ductal dilatation or choledocholithiasis. Pancreas: A multiloculated cystic lesion is seen in pancreatic tail which measures 2.3 cm, and shows a small solid areas centrally which  demonstrates contrast enhancement. In addition, there are other scattered tiny sub-cm cysts in the pancreatic body and tail. No evidence of main pancreatic ductal dilatation or pancreas divisum. Spleen:  Within normal limits in size and appearance. Adrenals/Urinary Tract: No masses identified. A few tiny renal cysts are noted. No evidence of hydronephrosis. Stomach/Bowel: Diffuse colonic diverticulosis is seen, however there is no evidence of diverticulitis involving visualized portion of colon. Vascular/Lymphatic: No pathologically enlarged lymph nodes identified. No abdominal aortic aneurysm. Other:  None. Musculoskeletal:  No suspicious bone lesions identified. IMPRESSION: Positive for acute cholecystitis. No evidence of biliary ductal dilatation or choledocholithiasis. 2.3 cm complex cystic lesion in pancreatic tail with enhancing central nodule, consistent with cystic pancreatic neoplasm. Other tiny less than 1 cm cystic foci are also noted within the pancreatic body and tail. Recommend further evaluation with EUS/FNA. Bibasilar atelectasis and tiny right pleural effusion. Colonic diverticulosis. These results will be called  to the ordering clinician or representative by the Radiologist Assistant, and communication documented in the PACS or zVision Dashboard. Electronically Signed   By: Marlaine Hind M.D.   On: 06/09/2019 08:56   Mr Abdomen Mrcp Moise Boring Contast  Result Date: 06/09/2019 CLINICAL DATA:  Abdominal pain. Pancreatic lesion seen on recent abdomen CTA. EXAM: MRI ABDOMEN WITHOUT AND WITH CONTRAST (INCLUDING MRCP) TECHNIQUE: Multiplanar multisequence MR imaging of the abdomen was performed both before and after the administration of intravenous contrast. Heavily T2-weighted images of the biliary and pancreatic ducts were obtained, and three-dimensional MRCP images were rendered by post processing. CONTRAST:  9 mL Gadavist COMPARISON:  Abdomen CT on 06/06/2019 FINDINGS: Lower chest: Mild bibasilar  atelectasis and tiny right pleural effusion. Hepatobiliary: No hepatic masses identified. No evidence of steatosis. The gallbladder is distended with a small gallstone seen in the neck. Diffuse gallbladder wall thickening and contrast enhancement are seen, consistent with acute cholecystitis. No evidence biliary ductal dilatation or choledocholithiasis. Pancreas: A multiloculated cystic lesion is seen in pancreatic tail which measures 2.3 cm, and shows a small solid areas centrally which demonstrates contrast enhancement. In addition, there are other scattered tiny sub-cm cysts in the pancreatic body and tail. No evidence of main pancreatic ductal dilatation or pancreas divisum. Spleen:  Within normal limits in size and appearance. Adrenals/Urinary Tract: No masses identified. A few tiny renal cysts are noted. No evidence of hydronephrosis. Stomach/Bowel: Diffuse colonic diverticulosis is seen, however there is no evidence of diverticulitis involving visualized portion of colon. Vascular/Lymphatic: No pathologically enlarged lymph nodes identified. No abdominal aortic aneurysm. Other:  None. Musculoskeletal:  No suspicious bone lesions identified. IMPRESSION: Positive for acute cholecystitis. No evidence of biliary ductal dilatation or choledocholithiasis. 2.3 cm complex cystic lesion in pancreatic tail with enhancing central nodule, consistent with cystic pancreatic neoplasm. Other tiny less than 1 cm cystic foci are also noted within the pancreatic body and tail. Recommend further evaluation with EUS/FNA. Bibasilar atelectasis and tiny right pleural effusion. Colonic diverticulosis. These results will be called to the ordering clinician or representative by the Radiologist Assistant, and communication documented in the PACS or zVision Dashboard. Electronically Signed   By: Marlaine Hind M.D.   On: 06/09/2019 08:56     Scheduled Meds:  atorvastatin  40 mg Oral QPM   diltiazem  180 mg Oral Daily   docusate  sodium  100 mg Oral BID   enoxaparin (LOVENOX) injection  40 mg Subcutaneous Q24H   gabapentin  100 mg Oral TID   insulin aspart  0-5 Units Subcutaneous QHS   insulin aspart  0-9 Units Subcutaneous TID WC   insulin detemir  35 Units Subcutaneous q morning - 10a   pantoprazole (PROTONIX) IV  40 mg Intravenous Q12H   sodium chloride flush  3 mL Intravenous Q12H   sucralfate  1 g Oral TID WC & HS   traZODone  100 mg Oral QHS   Continuous Infusions:  dextrose 5 % and 0.9% NaCl 75 mL/hr at 06/09/19 0552     LOS: 2 days   Time Spent in minutes   30 minutes  Valorie Mcgrory D.O. on 06/09/2019 at 2:05 PM  Between 7am to 7pm - Please see pager noted on [email or web address]  After 7pm go to [email or web address]  And look for the night coverage person covering for me after hours  [facility] Office  [phone]

## 2019-06-09 NOTE — Progress Notes (Signed)
Pharmacy Antibiotic Note  [name] is a 85 y.o. male admitted on 06/06/2019 by transfer from [facility] ER with abdominal pain and N/V and now s/p MRCP for choledocholithiasis on 8/18. Adding antibiotic coverage for possible intra-abdominal infection.  Pharmacy has been consulted for Zosyn dosing.  WBC is down some today at 15.4. SCr is trending back down at 1.28. Patient is afebrile.  Last dose of Zosyn was documented on 8/17.   Plan: Zosyn 3.375g IV evry 8 hours - extended infusion. Monitor renal function and clinical status  Height: 6' (182.9 cm) Weight: 190 lb (86.2 kg) IBW/kg (Calculated) : 77.6  Temp (24hrs), Avg:98.6 F (37 C), Min:98 F (36.7 C), Max:99.3 F (37.4 C)  Recent Labs  Lab 06/06/19 0043 06/06/19 0108 06/07/19 0749 06/08/19 0655 06/09/19 0814  WBC 13.5*  --  19.1* 19.2* 15.4*  CREATININE  --  0.94 1.10 1.53* 1.28*    Estimated Creatinine Clearance: 44.6 mL/min (A) (by C-G formula based on SCr of 1.28 mg/dL (H)).    No Known Allergies  Antimicrobials this admission: Zosyn 8/17 x1; 8/19 >>  Dose adjustments this admission:   Microbiology results: 8/16 COVID negative  Thank you for allowing pharmacy to be a part of this patient's care.  Brain Hilts 06/09/2019 10:10 PM

## 2019-06-09 NOTE — Consult Note (Signed)
Reason for Consult: Acute cholecystitis Referring Physician: Dr. Zadie Cleverly Robert [name] is an 85 y.o. [name].  HPI: Patient is an 85 year old [name] with a history of hypertension, diabetes, A. fib on no anticoagulation.  Patient was seen in outside ER several days ago secondary to abdominal pain, nausea, vomiting.  Patient states that this was the first occurrence.  Patient was just transferred to [facility] for further evaluation and management.  Patient underwent MRCP for possible choledocholithiasis.  Patient was found to have acute cholecystitis on MRCP with a mass in the tail of the pancreas, 2.3 cm that was cystic appearing.  Patient went HIDA scan which revealed no filling of the gallbladder consistent with acute cholecystitis.  Patient's laboratory studies revealed normal LFTs, and a leukocytosis of 13.5 elevated to 19.2 and currently trending down at 15.4.  Patient was consulted by GI.  He underwent endoscopy.  Endoscopy revealed no significant findings.  General surgery was consulted for further evaluation and management.  Past Medical History:  Diagnosis Date  . Anemia    related to frequent nosebleeds-right nostril.  . Arthritis    arthritis -back knees, most joints  . Breathing difficulty 11-10-12   difficult to breath if laying posteriorly, right nare difficulty  . Carotid artery occlusion   . Change in hearing   . Diabetes mellitus age 18  . Dysrhythmia 07/2012   hx. Atrial Flutter x1, converted spontaneously-no ploblems.   . H/O hiatal hernia   . HOH (hard of hearing) 11-10-12   bilaterally  . Hyperlipidemia   . Hypertension   . Ulcer 1995   bleeding ulcer, none since    Past Surgical History:  Procedure Laterality Date  . CAROTID DUPLEX  06/30/2012   PATENT RIGHT CAROTID ENDARTERECTOMY. 1%-39% LEFT ICA. STABLE  . CAROTID ENDARTERECTOMY  11/242008   right with Dacron patch angioplasty  . CATARACT EXTRACTION  1998 bilateral  done 3 months apart  .  ESOPHAGOGASTRODUODENOSCOPY (EGD) WITH PROPOFOL N/A 06/08/2019   Procedure: ESOPHAGOGASTRODUODENOSCOPY (EGD) WITH PROPOFOL;  Surgeon: Wilford Corner, MD;  Location: [facility];  Service: Endoscopy;  Laterality: N/A;  . EYE SURGERY    . JOINT REPLACEMENT Right 11-18-12  . KNEE BURSECTOMY  11/18/2012   Procedure: KNEE BURSECTOMY;  Surgeon: Tobi Bastos, MD;  Location: [facility];  Service: Orthopedics;  Laterality: Right;  . NM MYOCAR MULTIPLE W/SPECT  10/08/2012   NORMAL STRESS NUCLEAR STUDY.  . TOTAL KNEE ARTHROPLASTY  11/18/2012   Procedure: TOTAL KNEE ARTHROPLASTY;  Surgeon: Tobi Bastos, MD;  Location: [facility];  Service: Orthopedics;  Laterality: Right;  . TRANSTHORACIC ECHOCARDIOGRAM  10/08/2012   MODERATE LVH. MODERATE CONCENTRIC HYPERTROPHY.EF 65 TO 70%. GRADE 1 DYSTOLIC DYSFUNCTION. ELEVATED VENTRICULAR END-DIASTOLIC FILLING PRESSURE AND LEFT ATRIAL FILLING PRESSURE. AV- MILD TO MODERATE CALCIFIED ANNULUS. MV- CALCIFIC DEGENERATION.Marland Kitchen LA-MILDLY DILATED.    Family History  Problem Relation Age of Onset  . Cancer Mother        Pancreatic  . Heart disease Mother   . Cancer Brother        pancreactic  . Cancer Daughter        Breast    Social History:  reports that he quit smoking about 42 years ago. His smoking use included cigarettes. He has never used smokeless tobacco. He reports current alcohol use of about 1.0 standard drinks of alcohol per week. He reports that he does not use drugs.  Allergies: No Known Allergies  Medications: I have reviewed the patient's current medications.  Results for orders placed or performed during the hospital encounter of 06/06/19 (from the past 48 hour(s))  Glucose, capillary     Status: Abnormal   Collection Time: 06/07/19 10:28 PM  Result Value Ref Range   Glucose-Capillary 61 (L) 70 - 99 mg/dL  Glucose, capillary     Status: Abnormal   Collection Time: 06/07/19 10:52 PM  Result Value Ref Range   Glucose-Capillary 53 (L) 70 - 99 mg/dL   Glucose, capillary     Status: None   Collection Time: 06/07/19 11:37 PM  Result Value Ref Range   Glucose-Capillary 89 70 - 99 mg/dL  Glucose, capillary     Status: Abnormal   Collection Time: 06/08/19  3:36 AM  Result Value Ref Range   Glucose-Capillary 153 (H) 70 - 99 mg/dL  Basic metabolic panel     Status: Abnormal   Collection Time: 06/08/19  6:55 AM  Result Value Ref Range   Sodium 132 (L) 135 - 145 mmol/L   Potassium 3.2 (L) 3.5 - 5.1 mmol/L   Chloride 91 (L) 98 - 111 mmol/L   CO2 29 22 - 32 mmol/L   Glucose, Bld 160 (H) 70 - 99 mg/dL   BUN 18 8 - 23 mg/dL   Creatinine, Ser 1.53 (H) 0.61 - 1.24 mg/dL   Calcium 8.9 8.9 - 10.3 mg/dL   GFR calc non Af Amer 40 (L) >60 mL/min   GFR calc Af Amer 47 (L) >60 mL/min   Anion gap 12 5 - 15    Comment: Performed at [facility], [address], [place], [place] [address]  CBC     Status: Abnormal   Collection Time: 06/08/19  6:55 AM  Result Value Ref Range   WBC 19.2 (H) 4.0 - 10.5 K/uL   RBC 4.50 4.22 - 5.81 MIL/uL   Hemoglobin 14.2 13.0 - 17.0 g/dL   HCT 42.9 39.0 - 52.0 %   MCV 95.3 80.0 - 100.0 fL   MCH 31.6 26.0 - 34.0 pg   MCHC 33.1 30.0 - 36.0 g/dL   RDW 13.5 11.5 - 15.5 %   Platelets 219 150 - 400 K/uL   nRBC 0.0 0.0 - 0.2 %    Comment: Performed at [facility], [place] [address] [place], [place] [address]  Glucose, capillary     Status: Abnormal   Collection Time: 06/08/19  9:03 AM  Result Value Ref Range   Glucose-Capillary 167 (H) 70 - 99 mg/dL  Glucose, capillary     Status: Abnormal   Collection Time: 06/08/19 12:18 PM  Result Value Ref Range   Glucose-Capillary 179 (H) 70 - 99 mg/dL  Glucose, capillary     Status: None   Collection Time: 06/08/19  4:33 PM  Result Value Ref Range   Glucose-Capillary 95 70 - 99 mg/dL  Glucose, capillary     Status: None   Collection Time: 06/08/19 10:19 PM  Result Value Ref Range   Glucose-Capillary 82 70 - 99 mg/dL  Glucose, capillary     Status: Abnormal    Collection Time: 06/09/19  6:58 AM  Result Value Ref Range   Glucose-Capillary 127 (H) 70 - 99 mg/dL  Basic metabolic panel     Status: Abnormal   Collection Time: 06/09/19  8:14 AM  Result Value Ref Range   Sodium 135 135 - 145 mmol/L   Potassium 3.1 (L) 3.5 - 5.1 mmol/L   Chloride 98 98 - 111 mmol/L   CO2  27 22 - 32 mmol/L   Glucose, Bld 164 (H) 70 - 99 mg/dL   BUN 17 8 - 23 mg/dL   Creatinine, Ser 1.28 (H) 0.61 - 1.24 mg/dL   Calcium 8.5 (L) 8.9 - 10.3 mg/dL   GFR calc non Af Amer 50 (L) >60 mL/min   GFR calc Af Amer 58 (L) >60 mL/min   Anion gap 10 5 - 15    Comment: Performed at [facility] [address], [place], [place] [address]  CBC     Status: Abnormal   Collection Time: 06/09/19  8:14 AM  Result Value Ref Range   WBC 15.4 (H) 4.0 - 10.5 K/uL   RBC 4.10 (L) 4.22 - 5.81 MIL/uL   Hemoglobin 13.0 13.0 - 17.0 g/dL   HCT 39.4 39.0 - 52.0 %   MCV 96.1 80.0 - 100.0 fL   MCH 31.7 26.0 - 34.0 pg   MCHC 33.0 30.0 - 36.0 g/dL   RDW 13.4 11.5 - 15.5 %   Platelets 229 150 - 400 K/uL   nRBC 0.0 0.0 - 0.2 %    Comment: Performed at [facility], [place] [address] [place], [place] [address]  Glucose, capillary     Status: Abnormal   Collection Time: 06/09/19 11:45 AM  Result Value Ref Range   Glucose-Capillary 177 (H) 70 - 99 mg/dL  Glucose, capillary     Status: Abnormal   Collection Time: 06/09/19  4:36 PM  Result Value Ref Range   Glucose-Capillary 21 (LL) 70 - 99 mg/dL  Glucose, capillary     Status: Abnormal   Collection Time: 06/09/19  5:17 PM  Result Value Ref Range   Glucose-Capillary 170 (H) 70 - 99 mg/dL  Glucose, capillary     Status: Abnormal   Collection Time: 06/09/19  8:34 PM  Result Value Ref Range   Glucose-Capillary 180 (H) 70 - 99 mg/dL    Dg Chest 2 View  Result Date: 06/08/2019 CLINICAL DATA:  Fever. EXAM: CHEST - 2 VIEW COMPARISON:  January 10, 2017 FINDINGS: The cardiac silhouette is mildly enlarged. Calcific atherosclerotic disease of  the aorta. Scattered peribronchial airspace opacities in the left lower and mid thorax. No definite pleural effusion. Chronic mild compression deformity of one of the midthoracic vertebral bodies, likely T7, stable from the prior radiograph. Soft tissues are grossly normal. IMPRESSION: Scattered peribronchial airspace opacities with central predominance in the left lower and mid thorax may represent early bronchopneumonia or developing pulmonary vascular congestion. Electronically Signed   By: Fidela Salisbury M.D.   On: 06/08/2019 15:15   Nm Hepatobiliary Liver Func  Result Date: 06/09/2019 CLINICAL DATA:  Right upper quadrant pain and history of pancreatitis EXAM: NUCLEAR MEDICINE HEPATOBILIARY IMAGING TECHNIQUE: Sequential images of the abdomen were obtained out to 60 minutes following intravenous administration of radiopharmaceutical. RADIOPHARMACEUTICALS:  5.2 mCi Tc-30m  Choletec IV COMPARISON:  MRI from 06/08/2019, ultrasound from 06/06/2019 FINDINGS: Prompt uptake and biliary excretion of activity by the liver is seen. Biliary activity passes into small bowel, consistent with patent common bile duct. No visualization of the gallbladder is noted in the first 60 minutes. 3 mg of morphine was then administered and repeat imaging was performed. Gallbladder again did not visualized. IMPRESSION: Nonvisualization of the gallbladder despite morphine augmentation. This is likely related to cystic duct obstruction and changes of acute cholecystitis. This would correspond with that seen on the recent MRI. These results will be called to the ordering clinician or  representative by the Radiologist Assistant, and communication documented in the PACS or zVision Dashboard. Electronically Signed   By: Inez Catalina M.D.   On: 06/09/2019 16:13   Mr 3d Recon At Scanner  Result Date: 06/09/2019 CLINICAL DATA:  Abdominal pain. Pancreatic lesion seen on recent abdomen CTA. EXAM: MRI ABDOMEN WITHOUT AND WITH CONTRAST  (INCLUDING MRCP) TECHNIQUE: Multiplanar multisequence MR imaging of the abdomen was performed both before and after the administration of intravenous contrast. Heavily T2-weighted images of the biliary and pancreatic ducts were obtained, and three-dimensional MRCP images were rendered by post processing. CONTRAST:  9 mL Gadavist COMPARISON:  Abdomen CT on 06/06/2019 FINDINGS: Lower chest: Mild bibasilar atelectasis and tiny right pleural effusion. Hepatobiliary: No hepatic masses identified. No evidence of steatosis. The gallbladder is distended with a small gallstone seen in the neck. Diffuse gallbladder wall thickening and contrast enhancement are seen, consistent with acute cholecystitis. No evidence biliary ductal dilatation or choledocholithiasis. Pancreas: A multiloculated cystic lesion is seen in pancreatic tail which measures 2.3 cm, and shows a small solid areas centrally which demonstrates contrast enhancement. In addition, there are other scattered tiny sub-cm cysts in the pancreatic body and tail. No evidence of main pancreatic ductal dilatation or pancreas divisum. Spleen:  Within normal limits in size and appearance. Adrenals/Urinary Tract: No masses identified. A few tiny renal cysts are noted. No evidence of hydronephrosis. Stomach/Bowel: Diffuse colonic diverticulosis is seen, however there is no evidence of diverticulitis involving visualized portion of colon. Vascular/Lymphatic: No pathologically enlarged lymph nodes identified. No abdominal aortic aneurysm. Other:  None. Musculoskeletal:  No suspicious bone lesions identified. IMPRESSION: Positive for acute cholecystitis. No evidence of biliary ductal dilatation or choledocholithiasis. 2.3 cm complex cystic lesion in pancreatic tail with enhancing central nodule, consistent with cystic pancreatic neoplasm. Other tiny less than 1 cm cystic foci are also noted within the pancreatic body and tail. Recommend further evaluation with EUS/FNA. Bibasilar  atelectasis and tiny right pleural effusion. Colonic diverticulosis. These results will be called to the ordering clinician or representative by the Radiologist Assistant, and communication documented in the PACS or zVision Dashboard. Electronically Signed   By: Marlaine Hind M.D.   On: 06/09/2019 08:56   Mr Abdomen Mrcp Moise Boring Contast  Result Date: 06/09/2019 CLINICAL DATA:  Abdominal pain. Pancreatic lesion seen on recent abdomen CTA. EXAM: MRI ABDOMEN WITHOUT AND WITH CONTRAST (INCLUDING MRCP) TECHNIQUE: Multiplanar multisequence MR imaging of the abdomen was performed both before and after the administration of intravenous contrast. Heavily T2-weighted images of the biliary and pancreatic ducts were obtained, and three-dimensional MRCP images were rendered by post processing. CONTRAST:  9 mL Gadavist COMPARISON:  Abdomen CT on 06/06/2019 FINDINGS: Lower chest: Mild bibasilar atelectasis and tiny right pleural effusion. Hepatobiliary: No hepatic masses identified. No evidence of steatosis. The gallbladder is distended with a small gallstone seen in the neck. Diffuse gallbladder wall thickening and contrast enhancement are seen, consistent with acute cholecystitis. No evidence biliary ductal dilatation or choledocholithiasis. Pancreas: A multiloculated cystic lesion is seen in pancreatic tail which measures 2.3 cm, and shows a small solid areas centrally which demonstrates contrast enhancement. In addition, there are other scattered tiny sub-cm cysts in the pancreatic body and tail. No evidence of main pancreatic ductal dilatation or pancreas divisum. Spleen:  Within normal limits in size and appearance. Adrenals/Urinary Tract: No masses identified. A few tiny renal cysts are noted. No evidence of hydronephrosis. Stomach/Bowel: Diffuse colonic diverticulosis is seen, however there is no evidence of diverticulitis  involving visualized portion of colon. Vascular/Lymphatic: No pathologically enlarged lymph nodes  identified. No abdominal aortic aneurysm. Other:  None. Musculoskeletal:  No suspicious bone lesions identified. IMPRESSION: Positive for acute cholecystitis. No evidence of biliary ductal dilatation or choledocholithiasis. 2.3 cm complex cystic lesion in pancreatic tail with enhancing central nodule, consistent with cystic pancreatic neoplasm. Other tiny less than 1 cm cystic foci are also noted within the pancreatic body and tail. Recommend further evaluation with EUS/FNA. Bibasilar atelectasis and tiny right pleural effusion. Colonic diverticulosis. These results will be called to the ordering clinician or representative by the Radiologist Assistant, and communication documented in the PACS or zVision Dashboard. Electronically Signed   By: Marlaine Hind M.D.   On: 06/09/2019 08:56    Review of Systems  Constitutional: Negative for chills, fever and malaise/fatigue.  HENT: Negative for ear discharge, hearing loss and sore throat.   Eyes: Negative for blurred vision and discharge.  Respiratory: Negative for cough and shortness of breath.   Cardiovascular: Negative for chest pain, orthopnea and leg swelling.  Gastrointestinal: Positive for abdominal pain, nausea and vomiting. Negative for constipation, diarrhea and heartburn.  Musculoskeletal: Negative for myalgias and neck pain.  Skin: Negative for itching and rash.  Neurological: Negative for dizziness, focal weakness, seizures and loss of consciousness.  Endo/Heme/Allergies: Negative for environmental allergies. Does not bruise/bleed easily.  Psychiatric/Behavioral: Negative for depression and suicidal ideas.  All other systems reviewed and are negative.  Blood pressure 140/66, pulse 93, temperature 98 F (36.7 C), resp. rate 18, height 6' (1.829 m), weight 86.2 kg, SpO2 100 %. Physical Exam  Constitutional: He is oriented to person, place, and time. Vital signs are normal. He appears well-developed and well-nourished.  Conversant No acute  distress  Eyes: Lids are normal. No scleral icterus.  No lid lag Moist conjunctiva  Neck: No tracheal tenderness present. No thyromegaly present.  No cervical lymphadenopathy  Cardiovascular: Normal rate, regular rhythm and intact distal pulses.  No murmur heard. Respiratory: Effort normal and breath sounds normal. He has no wheezes. He has no rales.  GI: Soft. Bowel sounds are normal. He exhibits distension. He exhibits no mass. There is no hepatosplenomegaly. There is abdominal tenderness (RUQ). There is no rebound and no guarding. No hernia.  Musculoskeletal: Normal range of motion.  Neurological: He is alert and oriented to person, place, and time.  Normal gait and station  Skin: Skin is warm. No rash noted. No cyanosis. Nails show no clubbing.  Normal skin turgor  Psychiatric: Judgment normal.  Appropriate affect    Assessment/Plan: 85 year old [name] with acute cholecystitis Diabetes Hypertension A. fib.  Plan:  1.  I discussed with the patient, his wife, and his daughter who is an Therapist, sports in [place].  I discussed with him there is a high likelihood he has acute cholecystitis secondary to the above findings.  I discussed with him that due to his age and multiple comorbidities he would likely benefit from cardiac evaluation prior to surgery.  If the patient was felt to be a high risk for surgery patient would likely be a candidate for percutaneous cholecystostomy tube. 2.  Would recommend antibiotics will place on Zosyn. 3.  We will continue to follow along.  Ralene Ok 06/09/2019, 10:03 PM

## 2019-06-09 NOTE — Progress Notes (Signed)
PT Cancellation Note  Patient Details Name: [name] MRN: [id number] DOB: October 06, 1930   Cancelled Treatment:    Reason Eval/Treat Not Completed: Patient at procedure or test/unavailable.  Wife in the room waiting, will retry at another time.   [name] 06/09/2019, 2:46 PM   [name], PT MS Acute Rehab Dept. Number: [place] [facility] [place]

## 2019-06-09 NOTE — Progress Notes (Signed)
OT Cancellation Note  Patient Details Name: [name] MRN: [id number] DOB: [birth year]   Cancelled Treatment:    Reason Eval/Treat Not Completed: Patient at procedure or test/ unavailable(Off the floor. Discussed with RN about getting pt OOB after test. Discussed with wife that pt would benefit from performing his BLEs exercises and she was agreeable to perform them with him later today. Will return as schedule allows. Thank you.)  [place], OTR/L Acute Rehab Pager: [phone] Office: [phone] 06/09/2019, 3:47 PM

## 2019-06-10 ENCOUNTER — Encounter (HOSPITAL_COMMUNITY): Payer: Self-pay | Admitting: Physician Assistant

## 2019-06-10 DIAGNOSIS — Z01810 Encounter for preprocedural cardiovascular examination: Secondary | ICD-10-CM

## 2019-06-10 DIAGNOSIS — I10 Essential (primary) hypertension: Secondary | ICD-10-CM

## 2019-06-10 DIAGNOSIS — I4891 Unspecified atrial fibrillation: Secondary | ICD-10-CM

## 2019-06-10 DIAGNOSIS — N179 Acute kidney failure, unspecified: Secondary | ICD-10-CM

## 2019-06-10 DIAGNOSIS — I480 Paroxysmal atrial fibrillation: Secondary | ICD-10-CM

## 2019-06-10 LAB — CBC
HCT: 36.4 % — ABNORMAL LOW (ref 39.0–52.0)
Hemoglobin: 12 g/dL — ABNORMAL LOW (ref 13.0–17.0)
MCH: 31.4 pg (ref 26.0–34.0)
MCHC: 33 g/dL (ref 30.0–36.0)
MCV: 95.3 fL (ref 80.0–100.0)
Platelets: 258 10*3/uL (ref 150–400)
RBC: 3.82 MIL/uL — ABNORMAL LOW (ref 4.22–5.81)
RDW: 13.5 % (ref 11.5–15.5)
WBC: 11.7 10*3/uL — ABNORMAL HIGH (ref 4.0–10.5)
nRBC: 0 % (ref 0.0–0.2)

## 2019-06-10 LAB — GLUCOSE, CAPILLARY
Glucose-Capillary: 111 mg/dL — ABNORMAL HIGH (ref 70–99)
Glucose-Capillary: 239 mg/dL — ABNORMAL HIGH (ref 70–99)
Glucose-Capillary: 77 mg/dL (ref 70–99)
Glucose-Capillary: 66 mg/dL — ABNORMAL LOW (ref 70–99)
Glucose-Capillary: 76 mg/dL (ref 70–99)

## 2019-06-10 LAB — BASIC METABOLIC PANEL
Anion gap: 9 (ref 5–15)
BUN: 13 mg/dL (ref 8–23)
CO2: 30 mmol/L (ref 22–32)
Calcium: 8.2 mg/dL — ABNORMAL LOW (ref 8.9–10.3)
Chloride: 100 mmol/L (ref 98–111)
Creatinine, Ser: 1.19 mg/dL (ref 0.61–1.24)
GFR calc Af Amer: >60 mL/min (ref >60)
GFR calc non Af Amer: 55 mL/min — ABNORMAL LOW (ref >60)
Glucose, Bld: 87 mg/dL (ref 70–99)
Potassium: 2.7 mmol/L — CL (ref 3.5–5.1)
Sodium: 139 mmol/L (ref 135–145)

## 2019-06-10 LAB — POTASSIUM: Potassium: 2.9 mmol/L — ABNORMAL LOW (ref 3.5–5.1)

## 2019-06-10 LAB — MAGNESIUM: Magnesium: 1.6 mg/dL — ABNORMAL LOW (ref 1.7–2.4)

## 2019-06-10 MED ORDER — POTASSIUM CHLORIDE CRYS ER 20 MEQ PO TBCR
30.0000 meq | EXTENDED_RELEASE_TABLET | ORAL | Status: AC
  Administered 2019-06-10 (×2): 30 meq via ORAL
  Filled 2019-06-10 (×2): qty 1

## 2019-06-10 NOTE — Progress Notes (Signed)
Pt potassium-2.7. On call provider made aware. Awaiting orders. RN will continue to monitor.

## 2019-06-10 NOTE — Progress Notes (Signed)
[facility] Progress Note  2 Days Post-Op  Subjective: CC-  Overall feeling better than upon admission. Still a little sore in his right hemiabdomen. Denies n/v. Tolerating clears. WBC down to 11.7  Objective: Vital signs in last 24 hours: Temp:  [98 F (36.7 C)-99.3 F (37.4 C)] 98 F (36.7 C) (08/20 0559) Pulse Rate:  [62-103] 62 (08/20 0559) Resp:  [18] 18 (08/20 0559) BP: (119-156)/(62-70) 156/62 (08/20 0559) SpO2:  [93 %-100 %] 97 % (08/20 0559) Last BM Date: 06/09/19  Intake/Output from previous day: 08/19 0701 - 08/20 0700 In: 2577.2 [P.O.:1450; I.V.:1077.2; IV Piggyback:50] Out: 450 [Urine:450] Intake/Output this shift: No intake/output data recorded.  PE: Gen:  Alert, NAD, pleasant HEENT: EOM's intact, pupils equal and round Card: irregular Pulm:  CTAB, no W/R/R, rate and effort normal Abd: Soft, ND, +BS, no HSM, mild subjective right upper and lower TTP without rebound or guarding Skin: no rashes noted, warm and dry  Lab Results:  Recent Labs    06/09/19 0814 06/10/19 0427  WBC 15.4* 11.7*  HGB 13.0 12.0*  HCT 39.4 36.4*  PLT 229 258   BMET Recent Labs    06/09/19 0814 06/10/19 0427  NA 135 139  K 3.1* 2.7*  CL 98 100  CO2 27 30  GLUCOSE 164* 87  BUN 17 13  CREATININE 1.28* 1.19  CALCIUM 8.5* 8.2*   PT/INR No results for input(s): LABPROT, INR in the last 72 hours. CMP     Component Value Date/Time   NA 139 06/10/2019 0427   K 2.7 (LL) 06/10/2019 0427   CL 100 06/10/2019 0427   CO2 30 06/10/2019 0427   GLUCOSE 87 06/10/2019 0427   BUN 13 06/10/2019 0427   CREATININE 1.19 06/10/2019 0427   CALCIUM 8.2 (L) 06/10/2019 0427   PROT 7.5 06/06/2019 0108   ALBUMIN 4.0 06/06/2019 0108   AST 15 06/06/2019 0108   ALT 13 06/06/2019 0108   ALKPHOS 91 06/06/2019 0108   BILITOT 1.1 06/06/2019 0108   GFRNONAA 55 (L) 06/10/2019 0427   GFRAA >60 06/10/2019 0427   Lipase     Component Value Date/Time   LIPASE 21 06/06/2019  0108       Studies/Results: Dg Chest 2 View  Result Date: 06/08/2019 CLINICAL DATA:  Fever. EXAM: CHEST - 2 VIEW COMPARISON:  January 10, 2017 FINDINGS: The cardiac silhouette is mildly enlarged. Calcific atherosclerotic disease of the aorta. Scattered peribronchial airspace opacities in the left lower and mid thorax. No definite pleural effusion. Chronic mild compression deformity of one of the midthoracic vertebral bodies, likely T7, stable from the prior radiograph. Soft tissues are grossly normal. IMPRESSION: Scattered peribronchial airspace opacities with central predominance in the left lower and mid thorax may represent early bronchopneumonia or developing pulmonary vascular congestion. Electronically Signed   By: Fidela Salisbury M.D.   On: 06/08/2019 15:15   Nm Hepatobiliary Liver Func  Result Date: 06/09/2019 CLINICAL DATA:  Right upper quadrant pain and history of pancreatitis EXAM: NUCLEAR MEDICINE HEPATOBILIARY IMAGING TECHNIQUE: Sequential images of the abdomen were obtained out to 60 minutes following intravenous administration of radiopharmaceutical. RADIOPHARMACEUTICALS:  5.2 mCi Tc-22m  Choletec IV COMPARISON:  MRI from 06/08/2019, ultrasound from 06/06/2019 FINDINGS: Prompt uptake and biliary excretion of activity by the liver is seen. Biliary activity passes into small bowel, consistent with patent common bile duct. No visualization of the gallbladder is noted in the first 60 minutes. 3 mg of morphine was then administered and repeat imaging was  performed. Gallbladder again did not visualized. IMPRESSION: Nonvisualization of the gallbladder despite morphine augmentation. This is likely related to cystic duct obstruction and changes of acute cholecystitis. This would correspond with that seen on the recent MRI. These results will be called to the ordering clinician or representative by the Radiologist Assistant, and communication documented in the PACS or zVision Dashboard.  Electronically Signed   By: Inez Catalina M.D.   On: 06/09/2019 16:13   Mr 3d Recon At Scanner  Result Date: 06/09/2019 CLINICAL DATA:  Abdominal pain. Pancreatic lesion seen on recent abdomen CTA. EXAM: MRI ABDOMEN WITHOUT AND WITH CONTRAST (INCLUDING MRCP) TECHNIQUE: Multiplanar multisequence MR imaging of the abdomen was performed both before and after the administration of intravenous contrast. Heavily T2-weighted images of the biliary and pancreatic ducts were obtained, and three-dimensional MRCP images were rendered by post processing. CONTRAST:  9 mL Gadavist COMPARISON:  Abdomen CT on 06/06/2019 FINDINGS: Lower chest: Mild bibasilar atelectasis and tiny right pleural effusion. Hepatobiliary: No hepatic masses identified. No evidence of steatosis. The gallbladder is distended with a small gallstone seen in the neck. Diffuse gallbladder wall thickening and contrast enhancement are seen, consistent with acute cholecystitis. No evidence biliary ductal dilatation or choledocholithiasis. Pancreas: A multiloculated cystic lesion is seen in pancreatic tail which measures 2.3 cm, and shows a small solid areas centrally which demonstrates contrast enhancement. In addition, there are other scattered tiny sub-cm cysts in the pancreatic body and tail. No evidence of main pancreatic ductal dilatation or pancreas divisum. Spleen:  Within normal limits in size and appearance. Adrenals/Urinary Tract: No masses identified. A few tiny renal cysts are noted. No evidence of hydronephrosis. Stomach/Bowel: Diffuse colonic diverticulosis is seen, however there is no evidence of diverticulitis involving visualized portion of colon. Vascular/Lymphatic: No pathologically enlarged lymph nodes identified. No abdominal aortic aneurysm. Other:  None. Musculoskeletal:  No suspicious bone lesions identified. IMPRESSION: Positive for acute cholecystitis. No evidence of biliary ductal dilatation or choledocholithiasis. 2.3 cm complex  cystic lesion in pancreatic tail with enhancing central nodule, consistent with cystic pancreatic neoplasm. Other tiny less than 1 cm cystic foci are also noted within the pancreatic body and tail. Recommend further evaluation with EUS/FNA. Bibasilar atelectasis and tiny right pleural effusion. Colonic diverticulosis. These results will be called to the ordering clinician or representative by the Radiologist Assistant, and communication documented in the PACS or zVision Dashboard. Electronically Signed   By: Marlaine Hind M.D.   On: 06/09/2019 08:56   Mr Abdomen Mrcp Moise Boring Contast  Result Date: 06/09/2019 CLINICAL DATA:  Abdominal pain. Pancreatic lesion seen on recent abdomen CTA. EXAM: MRI ABDOMEN WITHOUT AND WITH CONTRAST (INCLUDING MRCP) TECHNIQUE: Multiplanar multisequence MR imaging of the abdomen was performed both before and after the administration of intravenous contrast. Heavily T2-weighted images of the biliary and pancreatic ducts were obtained, and three-dimensional MRCP images were rendered by post processing. CONTRAST:  9 mL Gadavist COMPARISON:  Abdomen CT on 06/06/2019 FINDINGS: Lower chest: Mild bibasilar atelectasis and tiny right pleural effusion. Hepatobiliary: No hepatic masses identified. No evidence of steatosis. The gallbladder is distended with a small gallstone seen in the neck. Diffuse gallbladder wall thickening and contrast enhancement are seen, consistent with acute cholecystitis. No evidence biliary ductal dilatation or choledocholithiasis. Pancreas: A multiloculated cystic lesion is seen in pancreatic tail which measures 2.3 cm, and shows a small solid areas centrally which demonstrates contrast enhancement. In addition, there are other scattered tiny sub-cm cysts in the pancreatic body and tail. No  evidence of main pancreatic ductal dilatation or pancreas divisum. Spleen:  Within normal limits in size and appearance. Adrenals/Urinary Tract: No masses identified. A few tiny renal  cysts are noted. No evidence of hydronephrosis. Stomach/Bowel: Diffuse colonic diverticulosis is seen, however there is no evidence of diverticulitis involving visualized portion of colon. Vascular/Lymphatic: No pathologically enlarged lymph nodes identified. No abdominal aortic aneurysm. Other:  None. Musculoskeletal:  No suspicious bone lesions identified. IMPRESSION: Positive for acute cholecystitis. No evidence of biliary ductal dilatation or choledocholithiasis. 2.3 cm complex cystic lesion in pancreatic tail with enhancing central nodule, consistent with cystic pancreatic neoplasm. Other tiny less than 1 cm cystic foci are also noted within the pancreatic body and tail. Recommend further evaluation with EUS/FNA. Bibasilar atelectasis and tiny right pleural effusion. Colonic diverticulosis. These results will be called to the ordering clinician or representative by the Radiologist Assistant, and communication documented in the PACS or zVision Dashboard. Electronically Signed   By: Marlaine Hind M.D.   On: 06/09/2019 08:56    Anti-infectives: Anti-infectives (From admission, onward)   Start     Dose/Rate Route Frequency Ordered Stop   06/09/19 2300  piperacillin-tazobactam (ZOSYN) IVPB 3.375 g     3.375 g 12.5 mL/hr over 240 Minutes Intravenous Every 8 hours 06/09/19 2221     06/06/19 0730  piperacillin-tazobactam (ZOSYN) IVPB 3.375 g     3.375 g 100 mL/hr over 30 Minutes Intravenous  Once 06/06/19 0715 06/06/19 0817       Assessment/Plan Diabetes HTN HLD A. Fib. Not on anticoagulation Pancreatic tail neoplasm Code status DNR AKI - resolved Hypokalemia - K 2.7, being replaced  Acute cholecystitis - HIDA + - Will await cardiology recommendations for preoperative risk stratification. Lap chole vs perc chole. Continue IV zosyn.  ID - zosyn 8/19>>. WBC down 11.7, afebrile FEN - IVF, CLD VTE - SCDs, lovenox Foley - none Follow up - TBD   LOS: 3 days    [name] ,  [facility] 06/10/2019, 9:15 AM Pager: [phone] Mon-Thurs 7:00 am-4:30 pm Fri 7:00 am -11:30 AM Sat-Sun 7:00 am-11:30 am

## 2019-06-10 NOTE — Progress Notes (Signed)
Occupational Therapy Treatment Patient Details Name: [name] MRN: [id number] DOB: 01-15-1930 Today's Date: 06/10/2019    History of present illness 85 y/o male presenting with abdominal pain. Past medical history significant of HTN; HLD; and DM. Admitted for further work-up.    OT comments  Pt making good progress with functional goals. Pt is participated in UB ADLs sitting EOB, OOB activity RW for mobility to [facility] then to recliner. Pt's wife present. OT will continue to follow acutely  Follow Up Recommendations  Home health OT;Supervision/Assistance - 24 hour    Equipment Recommendations  3 in 1 bedside commode;Other (comment)(reacher)    Recommendations for Other Services      Precautions / Restrictions Precautions Precautions: Fall Restrictions Weight Bearing Restrictions: No       Mobility Bed Mobility Overal bed mobility: Needs Assistance Bed Mobility: Supine to Sit     Supine to sit: Mod assist Sit to supine: Mod assist   General bed mobility comments: mod to lift trunk  Transfers Overall transfer level: Needs assistance Equipment used: Rolling walker (2 wheeled) Transfers: Sit to/from Stand Sit to Stand: Mod assist;Min assist         General transfer comment: mod assist initially then gradually min assist    Balance Overall balance assessment: Needs assistance Sitting-balance support: Feet supported Sitting balance-Leahy Scale: Fair Sitting balance - Comments: fair with abiltiy to control once up to side of bed Postural control: Posterior lean Standing balance support: Bilateral upper extremity supported;During functional activity Standing balance-Leahy Scale: Poor Standing balance comment: requires RW for control of standing, but has need for dense cues for posture and grip on walker                            ADL either performed or assessed with clinical judgement   ADL Overall ADL's : Needs assistance/impaired Eating/Feeding:  Sitting;Independent               Upper Body Dressing : Supervision/safety;Sitting;Set up       Toilet Transfer: RW;Moderate assistance;Minimal assistance;BSC;Ambulation   Toileting- Clothing Manipulation and Hygiene: Moderate assistance;Sit to/from stand       Functional mobility during ADLs: Rolling walker;Moderate assistance;Minimal assistance;Cueing for safety       Vision Patient Visual Report: No change from baseline     Perception     Praxis      Cognition Arousal/Alertness: Awake/alert Behavior During Therapy: WFL for tasks assessed/performed Overall Cognitive Status: Within Functional Limits for tasks assessed                                 General Comments: HOH, attending to PT instructions, sits impulsively bedside        Exercises Exercises: General Lower Extremity General Exercises - Lower Extremity Long Arc Quad: Strengthening;Both;10 reps   Shoulder Instructions       General Comments Pt is sitting with impulsive quality bedside, talked with him about slowing down to reach back for the bed    Pertinent Vitals/ Pain       Pain Assessment: No/denies pain Pain Intervention(s): Monitored during session  Home Living                                          Prior Functioning/Environment  Frequency  Min 2X/week        Progress Toward Goals  OT Goals(current goals can now be found in the care plan section)  Progress towards OT goals: Progressing toward goals  Acute Rehab OT Goals Patient Stated Goal: return home soon  Plan Discharge plan remains appropriate    Co-evaluation                 AM-PAC OT "6 Clicks" Daily Activity     Outcome Measure   Help from another person eating meals?: None Help from another person taking care of personal grooming?: A Little Help from another person toileting, which includes using toliet, bedpan, or urinal?: A Little Help from another  person bathing (including washing, rinsing, drying)?: A Little Help from another person to put on and taking off regular upper body clothing?: A Little Help from another person to put on and taking off regular lower body clothing?: A Little 6 Click Score: 19    End of Session Equipment Utilized During Treatment: Rolling walker;Gait belt;Other (comment)(BSC)  OT Visit Diagnosis: Other abnormalities of gait and mobility (R26.89);Muscle weakness (generalized) (M62.81)   Activity Tolerance Patient tolerated treatment well   Patient Left with family/visitor present;in chair;with call bell/phone within reach   Nurse Communication          Time: 3614-4315 OT Time Calculation (min): 24 min  Charges: OT General Charges $OT Visit: 1 Visit OT Treatments $Self Care/Home Management : 8-22 mins $Therapeutic Activity: 8-22 mins     Britt Bottom 06/10/2019, 3:03 PM

## 2019-06-10 NOTE — Progress Notes (Signed)
Physical Therapy Treatment Patient Details Name: [name] MRN: [id number] DOB: 11-07-1929 Today's Date: 06/10/2019    History of Present Illness 85 y/o male presenting with abdominal pain. Past medical history significant of HTN; HLD; and DM. Admitted for further work-up.     PT Comments    Pt was seen by PT for exercises and short bouts of gait with a significant change since last PT session.  However he is much declined from last PT session, and the concern now is whether home is still appropriate.  Plan to follow up with him to see if he is going to need rehab vs SNF since his testing and procedures are still ongoing.  Acute therapy for exercises, balance control and gait as tolerated and will determine his needs for follow up rehab at dc next session.      Follow Up Recommendations  Home health PT;Supervision/Assistance - 24 hour     Equipment Recommendations  None recommended by PT    Recommendations for Other Services       Precautions / Restrictions Precautions Precautions: Fall Restrictions Weight Bearing Restrictions: No    Mobility  Bed Mobility Overal bed mobility: Needs Assistance Bed Mobility: Supine to Sit;Sit to Supine     Supine to sit: Mod assist Sit to supine: Mod assist   General bed mobility comments: mod to lift trunk up and mod to lift legs back  Transfers Overall transfer level: Needs assistance Equipment used: Rolling walker (2 wheeled) Transfers: Sit to/from Stand Sit to Stand: Mod assist;Min assist         General transfer comment: mod assist initially then gradually min assist with repetition  Ambulation/Gait Ambulation/Gait assistance: Min assist Gait Distance (Feet): 12 Feet(4+4+4) Assistive device: Rolling walker (2 wheeled) Gait Pattern/deviations: Step-to pattern;Decreased stride length;Shuffle;Trunk flexed Gait velocity: decreased Gait velocity interpretation: <1.8 ft/sec, indicate of risk for recurrent falls General Gait  Details: pt is able to move walker with cues and practice   Stairs             Wheelchair Mobility    Modified Rankin (Stroke Patients Only)       Balance Overall balance assessment: Needs assistance Sitting-balance support: Feet supported Sitting balance-Leahy Scale: Fair Sitting balance - Comments: fair with abiltiy to control once up to side of bed Postural control: Posterior lean Standing balance support: Bilateral upper extremity supported;During functional activity Standing balance-Leahy Scale: Poor Standing balance comment: requires RW for control of standing, but has need for dense cues for posture and grip on walker                             Cognition Arousal/Alertness: Awake/alert Behavior During Therapy: WFL for tasks assessed/performed Overall Cognitive Status: Within Functional Limits for tasks assessed                                 General Comments: HOH, attending to PT instructions, sits impulsively bedside      Exercises General Exercises - Lower Extremity Long Arc Quad: Strengthening;Both;10 reps    General Comments General comments (skin integrity, edema, etc.): Pt is sitting with impulsive quality bedside, talked with him about slowing down to reach back for the bed      Pertinent Vitals/Pain Pain Assessment: No/denies pain    Home Living  Prior Function            PT Goals (current goals can now be found in the care plan section) Acute Rehab PT Goals Patient Stated Goal: return home soon Progress towards PT goals: Progressing toward goals    Frequency    Min 3X/week      PT Plan Current plan remains appropriate    Co-evaluation              AM-PAC PT "6 Clicks" Mobility   Outcome Measure  Help needed turning from your back to your side while in a flat bed without using bedrails?: A Little Help needed moving from lying on your back to sitting on the side of a  flat bed without using bedrails?: A Lot Help needed moving to and from a bed to a chair (including a wheelchair)?: A Lot Help needed standing up from a chair using your arms (e.g., wheelchair or bedside chair)?: A Lot Help needed to walk in hospital room?: A Little Help needed climbing 3-5 steps with a railing? : Total 6 Click Score: 13    End of Session Equipment Utilized During Treatment: Gait belt Activity Tolerance: Patient tolerated treatment well;Patient limited by fatigue Patient left: in bed;with call bell/phone within reach;with bed alarm set;with family/visitor present Nurse Communication: Mobility status PT Visit Diagnosis: Unsteadiness on feet (R26.81);Other abnormalities of gait and mobility (R26.89);Muscle weakness (generalized) (M62.81);History of falling (Z91.81)     Time: 7035-0093 PT Time Calculation (min) (ACUTE ONLY): 25 min  Charges:  $Gait Training: 8-22 mins $Therapeutic Exercise: 8-22 mins                    [name] 06/10/2019, 1:28 PM   [name], PT MS Acute Rehab Dept. Number: [place] and [place]

## 2019-06-10 NOTE — Progress Notes (Signed)
Hypoglycemic Event  CBG: Results for [name], [name] (MRN [id number]) as of 06/10/2019 21:34  Ref. Range 06/10/2019 21:20  Glucose-Capillary Latest Ref Range: 70 - 99 mg/dL 66 (L)    Treatment: 4 oz juice/soda  Symptoms: None  Follow-up CBG: Time: CBG Result: Results for [name], [name] (MRN [id number]) as of 06/10/2019 22:09  Ref. Range 06/10/2019 22:05  Glucose-Capillary Latest Ref Range: 70 - 99 mg/dL 76    Possible Reasons for Event: Inadequate meal intake  Comments/MD notified:    Viviano Simas

## 2019-06-10 NOTE — Progress Notes (Signed)
PROGRESS NOTE    Alquan Morrish  XVQ:[id number] DOB: 08-05-1930 DOA: 06/06/2019 PCP: [name], MD   Brief Narrative:  HPI on 06/06/2019 by Dr. Karmen Bongo Jermarcus Mcfadyen is a 85 y.o. male with medical history significant of HTN; HLD; and DM presenting as transfer from [facility] for n/v.  He developed abdominal pain yesterday before lunch.  It worsened and he developed n/v.  Epigastric TTP.  His last emesis was last night about 11PM.  He is still having pain.  He had a normal BM yesterday AM.  He does not have chest pain, no palpitations.  Denies known h/o afib.  I spoke with his wife.  He had knee replacement in 2014.  Dr. Gwenlyn Found did a pre-op examination and he did have afib at that time.  He is taking Diltiazem.   His wife has afib and was previously on Plavix but now has a pacer.  He has never been on [facility].  His wife requests that I discuss AC with their RN daughter, Ashaun Gaughan, teaches nursing at [employer], [phone].   He has a h/o gastritis and pancreatitis.  He did a bleeding ulcer in 1978.  The last time he fell, he hit his head hard and hard a big gash on his head.  He felt like his hip gave out, which has happened before.  She would like to discuss this further with her parents and siblings and will let us know.  Interim history Patient admitted with abdominal pain.  Gastroenterology consulted and appreciated, status post EGD showing mild gastritis.  MRCP and HIDA showed acute cholecystitis.  General surgery consulted. Assessment & Plan   Abdominal pain/acute cholecystitis -Gastroenterology consulted and appreciated -EGD showed mild gastritis otherwise normal EGD -CTA A/P reveals hypodense lesion in the body of pancreas -GI recommended MRCP-positive for acute cholecystitis.  No evidence of biliary ductal dilatation or choledocholithiasis.  2.3 cm complex cystic lesion in the pancreatic tail with enhancing central nodule consistent with cystic pancreatic neoplasm -GI now recommends  HIDA scan with conservative management. -HIDA scan showed nonvisualization of gallbladder despite morphine augmentation, likely related to cystic duct obstruction and changes of acute cholecystitis. -Continue PPI, pain control, antiemetics, IV fluid -General surgery consulted and appreciated has requested cardiology clearance  Paroxysmal atrial fibrillation -Patient states he has seen Dr. Alvester Chou in the past -Currently not on anticoagulation due to history of falls and hitting his head -Currently rate controlled on diltiazem  Acute kidney injury/dehydration -Likely due to persistent nausea and vomiting prior to admission -Improved, Creatinine currently 1.19 -Continue IV fluids  Essential hypertension -Continue Cardizem -HCTZ held  Hyperlipidemia -Continue statin  Diabetes mellitus, type II -Continue Levemir, insulin sliding scale with CBG monitoring -Metformin held  Hypokalemia -Potassium down to 2.7, will continue to replace. -Obtain magnesium level  DVT Prophylaxis Lovenox  Code Status: DNR  Family Communication: None at bedside  Disposition Plan: Admitted.  Pending cardiology preop assessment and further recommendations from general surgery.  Consultants Gastroenterology General surgery Cardiology  Procedures  EGD MRCP HIDA  Antibiotics   Anti-infectives (From admission, onward)   Start     Dose/Rate Route Frequency Ordered Stop   06/09/19 2300  piperacillin-tazobactam (ZOSYN) IVPB 3.375 g     3.375 g 12.5 mL/hr over 240 Minutes Intravenous Every 8 hours 06/09/19 2221     06/06/19 0730  piperacillin-tazobactam (ZOSYN) IVPB 3.375 g     3.375 g 100 mL/hr over 30 Minutes Intravenous  Once 06/06/19 0715 06/06/19 0817  Subjective:   [name] seen and examined today.  Patient continues to have some abdominal pain.  Denies current nausea or vomiting.  Denies chest pain or shortness of breath, dizziness or headache. Objective:   Vitals:   06/09/19 1633  06/09/19 2035 06/10/19 0559 06/10/19 0916  BP: (!) 151/66 140/66 (!) 156/62 (!) 152/58  Pulse: (!) 102 93 62 93  Resp: 18 18 18 19   Temp: 98.1 F (36.7 C) 98 F (36.7 C) 98 F (36.7 C) 97.9 F (36.6 C)  TempSrc: Oral  Oral Oral  SpO2: 93% 100% 97% 95%  Weight:      Height:        Intake/Output Summary (Last 24 hours) at 06/10/2019 1000 Last data filed at 06/10/2019 0845 Gross per 24 hour  Intake 2161.69 ml  Output 450 ml  Net 1711.69 ml   Filed Weights   06/06/19 0025  Weight: 86.2 kg   Exam  General: Well developed, well nourished, NAD, appears stated age  1: NCAT, mucous membranes moist.   Cardiovascular: S1 S2 auscultated, irregular  Respiratory: Clear to auscultation bilaterally with equal chest rise  Abdomen: Soft, diffusely TTP, nondistended, + bowel sounds  Extremities: warm dry without cyanosis clubbing or edema  Neuro: AAOx3, nonfocal  Psych: Pleasant, appropriate mood and affect  Data Reviewed: I have personally reviewed following labs and imaging studies  CBC: Recent Labs  Lab 06/06/19 0043 06/07/19 0749 06/08/19 0655 06/09/19 0814 06/10/19 0427  WBC 13.5* 19.1* 19.2* 15.4* 11.7*  NEUTROABS 11.8*  --   --   --   --   HGB 13.6 13.7 14.2 13.0 12.0*  HCT 41.4 40.6 42.9 39.4 36.4*  MCV 96.5 95.1 95.3 96.1 95.3  PLT 230 230 219 229 665   Basic Metabolic Panel: Recent Labs  Lab 06/06/19 0108 06/07/19 0749 06/08/19 0655 06/09/19 0814 06/10/19 0427  NA 137 136 132* 135 139  K 3.8 3.5 3.2* 3.1* 2.7*  CL 97* 94* 91* 98 100  CO2 24 29 29 27 30   GLUCOSE 215* 146* 160* 164* 87  BUN 15 13 18 17 13   CREATININE 0.94 1.10 1.53* 1.28* 1.19  CALCIUM 9.8 9.4 8.9 8.5* 8.2*   GFR: Estimated Creatinine Clearance: 48 mL/min (by C-G formula based on SCr of 1.19 mg/dL). Liver Function Tests: Recent Labs  Lab 06/06/19 0108  AST 15  ALT 13  ALKPHOS 91  BILITOT 1.1  PROT 7.5  ALBUMIN 4.0   Recent Labs  Lab 06/06/19 0108  LIPASE 21   No  results for input(s): AMMONIA in the last 168 hours. Coagulation Profile: No results for input(s): INR, PROTIME in the last 168 hours. Cardiac Enzymes: No results for input(s): CKTOTAL, CKMB, CKMBINDEX, TROPONINI in the last 168 hours. BNP (last 3 results) No results for input(s): PROBNP in the last 8760 hours. HbA1C: No results for input(s): HGBA1C in the last 72 hours. CBG: Recent Labs  Lab 06/09/19 1145 06/09/19 1636 06/09/19 1717 06/09/19 2034 06/10/19 0701  GLUCAP 177* 21* 170* 180* 111*   Lipid Profile: No results for input(s): CHOL, HDL, LDLCALC, TRIG, CHOLHDL, LDLDIRECT in the last 72 hours. Thyroid Function Tests: No results for input(s): TSH, T4TOTAL, FREET4, T3FREE, THYROIDAB in the last 72 hours. Anemia Panel: No results for input(s): VITAMINB12, FOLATE, FERRITIN, TIBC, IRON, RETICCTPCT in the last 72 hours. Urine analysis:    Component Value Date/Time   COLORURINE YELLOW 06/06/2019 [place] 06/06/2019 0043   LABSPEC >1.030 (H) 06/06/2019 9935  PHURINE 6.5 06/06/2019 0043   GLUCOSEU >=500 (A) 06/06/2019 0043   HGBUR TRACE (A) 06/06/2019 0043   BILIRUBINUR NEGATIVE 06/06/2019 0043   KETONESUR 40 (A) 06/06/2019 0043   PROTEINUR >300 (A) 06/06/2019 0043   UROBILINOGEN 0.2 11/10/2012 1432   NITRITE NEGATIVE 06/06/2019 0043   LEUKOCYTESUR NEGATIVE 06/06/2019 0043   Sepsis Labs: @LABRCNTIP (procalcitonin:4,lacticidven:4)  ) Recent Results (from the past 240 hour(s))  SARS CORONAVIRUS 2 Nasal Swab Aptima Multi Swab     Status: None   Collection Time: 06/06/19  2:03 AM   Specimen: Aptima Multi Swab; Nasal Swab  Result Value Ref Range Status   SARS Coronavirus 2 NEGATIVE NEGATIVE Final    Comment: (NOTE) SARS-CoV-2 target nucleic acids are NOT DETECTED. The SARS-CoV-2 RNA is generally detectable in upper and lower respiratory specimens during the acute phase of infection. Negative results do not preclude SARS-CoV-2 infection, do not rule  out co-infections with other pathogens, and should not be used as the sole basis for treatment or other patient management decisions. Negative results must be combined with clinical observations, patient history, and epidemiological information. The expected result is Negative. Fact Sheet for Patients: SugarRoll.be Fact Sheet for Healthcare Providers: https://www.woods-mathews.com/ This test is not yet approved or cleared by the [place] FDA and  has been authorized for detection and/or diagnosis of SARS-CoV-2 by FDA under an Emergency Use Authorization (EUA). This EUA will remain  in effect (meaning this test can be used) for the duration of the COVID-19 declaration under Section 56 4(b)(1) of the Act, 21 U.S.C. section 360bbb-3(b)(1), unless the authorization is terminated or revoked sooner. Performed at [facility], [place] [address] [place], [place] [address]       Radiology Studies: Dg Chest 2 View  Result Date: 06/08/2019 CLINICAL DATA:  Fever. EXAM: CHEST - 2 VIEW COMPARISON:  January 10, 2017 FINDINGS: The cardiac silhouette is mildly enlarged. Calcific atherosclerotic disease of the aorta. Scattered peribronchial airspace opacities in the left lower and mid thorax. No definite pleural effusion. Chronic mild compression deformity of one of the midthoracic vertebral bodies, likely T7, stable from the prior radiograph. Soft tissues are grossly normal. IMPRESSION: Scattered peribronchial airspace opacities with central predominance in the left lower and mid thorax may represent early bronchopneumonia or developing pulmonary vascular congestion. Electronically Signed   By: Fidela Salisbury M.D.   On: 06/08/2019 15:15   Nm Hepatobiliary Liver Func  Result Date: 06/09/2019 CLINICAL DATA:  Right upper quadrant pain and history of pancreatitis EXAM: NUCLEAR MEDICINE HEPATOBILIARY IMAGING TECHNIQUE: Sequential images of the abdomen were  obtained out to 60 minutes following intravenous administration of radiopharmaceutical. RADIOPHARMACEUTICALS:  5.2 mCi Tc-68m  Choletec IV COMPARISON:  MRI from 06/08/2019, ultrasound from 06/06/2019 FINDINGS: Prompt uptake and biliary excretion of activity by the liver is seen. Biliary activity passes into small bowel, consistent with patent common bile duct. No visualization of the gallbladder is noted in the first 60 minutes. 3 mg of morphine was then administered and repeat imaging was performed. Gallbladder again did not visualized. IMPRESSION: Nonvisualization of the gallbladder despite morphine augmentation. This is likely related to cystic duct obstruction and changes of acute cholecystitis. This would correspond with that seen on the recent MRI. These results will be called to the ordering clinician or representative by the Radiologist Assistant, and communication documented in the PACS or zVision Dashboard. Electronically Signed   By: Inez Catalina M.D.   On: 06/09/2019 16:13   Mr 3d Recon At Scanner  Result Date: 06/09/2019 CLINICAL DATA:  Abdominal pain. Pancreatic lesion seen on recent abdomen CTA. EXAM: MRI ABDOMEN WITHOUT AND WITH CONTRAST (INCLUDING MRCP) TECHNIQUE: Multiplanar multisequence MR imaging of the abdomen was performed both before and after the administration of intravenous contrast. Heavily T2-weighted images of the biliary and pancreatic ducts were obtained, and three-dimensional MRCP images were rendered by post processing. CONTRAST:  9 mL Gadavist COMPARISON:  Abdomen CT on 06/06/2019 FINDINGS: Lower chest: Mild bibasilar atelectasis and tiny right pleural effusion. Hepatobiliary: No hepatic masses identified. No evidence of steatosis. The gallbladder is distended with a small gallstone seen in the neck. Diffuse gallbladder wall thickening and contrast enhancement are seen, consistent with acute cholecystitis. No evidence biliary ductal dilatation or choledocholithiasis. Pancreas:  A multiloculated cystic lesion is seen in pancreatic tail which measures 2.3 cm, and shows a small solid areas centrally which demonstrates contrast enhancement. In addition, there are other scattered tiny sub-cm cysts in the pancreatic body and tail. No evidence of main pancreatic ductal dilatation or pancreas divisum. Spleen:  Within normal limits in size and appearance. Adrenals/Urinary Tract: No masses identified. A few tiny renal cysts are noted. No evidence of hydronephrosis. Stomach/Bowel: Diffuse colonic diverticulosis is seen, however there is no evidence of diverticulitis involving visualized portion of colon. Vascular/Lymphatic: No pathologically enlarged lymph nodes identified. No abdominal aortic aneurysm. Other:  None. Musculoskeletal:  No suspicious bone lesions identified. IMPRESSION: Positive for acute cholecystitis. No evidence of biliary ductal dilatation or choledocholithiasis. 2.3 cm complex cystic lesion in pancreatic tail with enhancing central nodule, consistent with cystic pancreatic neoplasm. Other tiny less than 1 cm cystic foci are also noted within the pancreatic body and tail. Recommend further evaluation with EUS/FNA. Bibasilar atelectasis and tiny right pleural effusion. Colonic diverticulosis. These results will be called to the ordering clinician or representative by the Radiologist Assistant, and communication documented in the PACS or zVision Dashboard. Electronically Signed   By: Marlaine Hind M.D.   On: 06/09/2019 08:56   Mr Abdomen Mrcp Moise Boring Contast  Result Date: 06/09/2019 CLINICAL DATA:  Abdominal pain. Pancreatic lesion seen on recent abdomen CTA. EXAM: MRI ABDOMEN WITHOUT AND WITH CONTRAST (INCLUDING MRCP) TECHNIQUE: Multiplanar multisequence MR imaging of the abdomen was performed both before and after the administration of intravenous contrast. Heavily T2-weighted images of the biliary and pancreatic ducts were obtained, and three-dimensional MRCP images were rendered  by post processing. CONTRAST:  9 mL Gadavist COMPARISON:  Abdomen CT on 06/06/2019 FINDINGS: Lower chest: Mild bibasilar atelectasis and tiny right pleural effusion. Hepatobiliary: No hepatic masses identified. No evidence of steatosis. The gallbladder is distended with a small gallstone seen in the neck. Diffuse gallbladder wall thickening and contrast enhancement are seen, consistent with acute cholecystitis. No evidence biliary ductal dilatation or choledocholithiasis. Pancreas: A multiloculated cystic lesion is seen in pancreatic tail which measures 2.3 cm, and shows a small solid areas centrally which demonstrates contrast enhancement. In addition, there are other scattered tiny sub-cm cysts in the pancreatic body and tail. No evidence of main pancreatic ductal dilatation or pancreas divisum. Spleen:  Within normal limits in size and appearance. Adrenals/Urinary Tract: No masses identified. A few tiny renal cysts are noted. No evidence of hydronephrosis. Stomach/Bowel: Diffuse colonic diverticulosis is seen, however there is no evidence of diverticulitis involving visualized portion of colon. Vascular/Lymphatic: No pathologically enlarged lymph nodes identified. No abdominal aortic aneurysm. Other:  None. Musculoskeletal:  No suspicious bone lesions identified. IMPRESSION: Positive for acute cholecystitis. No evidence of biliary ductal  dilatation or choledocholithiasis. 2.3 cm complex cystic lesion in pancreatic tail with enhancing central nodule, consistent with cystic pancreatic neoplasm. Other tiny less than 1 cm cystic foci are also noted within the pancreatic body and tail. Recommend further evaluation with EUS/FNA. Bibasilar atelectasis and tiny right pleural effusion. Colonic diverticulosis. These results will be called to the ordering clinician or representative by the Radiologist Assistant, and communication documented in the PACS or zVision Dashboard. Electronically Signed   By: Marlaine Hind M.D.    On: 06/09/2019 08:56     Scheduled Meds:  atorvastatin  40 mg Oral QPM   diltiazem  180 mg Oral Daily   docusate sodium  100 mg Oral BID   enoxaparin (LOVENOX) injection  40 mg Subcutaneous Q24H   gabapentin  100 mg Oral TID   insulin aspart  0-5 Units Subcutaneous QHS   insulin aspart  0-9 Units Subcutaneous TID WC   insulin detemir  35 Units Subcutaneous q morning - 10a   pantoprazole (PROTONIX) IV  40 mg Intravenous Q12H   potassium chloride  30 mEq Oral Q4H   sodium chloride flush  3 mL Intravenous Q12H   sucralfate  1 g Oral TID WC & HS   traZODone  100 mg Oral QHS   Continuous Infusions:  dextrose 5 % and 0.9% NaCl 75 mL/hr at 06/09/19 2215   piperacillin-tazobactam (ZOSYN)  IV 3.375 g (06/10/19 0908)     LOS: 3 days   Time Spent in minutes   30 minutes  Oiva Dibari D.O. on 06/10/2019 at 10:00 AM  Between 7am to 7pm - Please see pager noted on [email or web address]  After 7pm go to [email or web address]  And look for the night coverage person covering for me after hours  [facility] Office  [phone]

## 2019-06-10 NOTE — Care Management Important Message (Signed)
Important Message  Patient Details  Name: Crit Obremski MRN: [id number] Date of Birth: 1929/11/20   Medicare Important Message Given:  Yes     Craige Patel 06/10/2019, 2:57 PM

## 2019-06-10 NOTE — Progress Notes (Signed)
[facility] Gastroenterology Progress Note  [name] 85 y.o. 01/24/1930   Subjective: Feels ok. Denies abdominal pain, nausea, or vomiting.  Objective: Vital signs: Vitals:   06/10/19 0559 06/10/19 0916  BP: (!) 156/62 (!) 152/58  Pulse: 62 93  Resp: 18 19  Temp: 98 F (36.7 C) 97.9 F (36.6 C)  SpO2: 97% 95%    Physical Exam: Gen: lethargic, elderly, frail, no acute distress, pleasant HEENT: anicteric sclera CV: RRR Chest: CTA B Abd: upper quadrant tenderness with guarding, soft, nondistended, +BS Ext: no edema  Lab Results: Recent Labs    06/09/19 0814 06/10/19 0427  NA 135 139  K 3.1* 2.7*  CL 98 100  CO2 27 30  GLUCOSE 164* 87  BUN 17 13  CREATININE 1.28* 1.19  CALCIUM 8.5* 8.2*   No results for input(s): AST, ALT, ALKPHOS, BILITOT, PROT, ALBUMIN in the last 72 hours. Recent Labs    06/09/19 0814 06/10/19 0427  WBC 15.4* 11.7*  HGB 13.0 12.0*  HCT 39.4 36.4*  MCV 96.1 95.3  PLT 229 258      Assessment/Plan: Acute cholecystitis seen on MRCP and confirmed on HIDA scan. Surgery has seen and awaiting cardiac clearance. Pancreatic lesion also seen on MRCP and I would recommend conservative management. If patient and family want to pursue evaluation of this lesion then would need referral to a tertiary care center as an outpt for possible EUS/FNA. Will sign off. Please call back if questions.   [name] 06/10/2019, 10:10 AM  Questions please call [phone] ID: [name], male   DOB: [birth year], 85 y.o.   MRN: [id number]

## 2019-06-10 NOTE — Consult Note (Addendum)
Cardiology Consultation:   Patient ID: [name]; [id number]; 11-29-1929   Admit date: 06/06/2019 Date of Consult: 06/10/2019  Primary Care Provider: [name], MD Primary Cardiologist: Quay Burow, MD [phone]) Primary Electrophysiologist:  None  Chief Complaint: abdominal pain  Patient Profile:   [name] is a 85 y.o. male with a hx of Carotid artery disease s/p remote R CEA, atrial flutter in 2013, mild AI by echo 2013, HTN, DM, HLD, remote bleeding ulcer, recent falls, knee arthritis, hiatal hernia who is being seen today for the evaluation of pre-op clearance/atrial fib at the request of [name] as well as general surgery team.  Wife, [name], at bedside sees Dr. Erick Alley.  History of Present Illness:   The patient had a remote normal nuclear stress test in 2013. His echocardiogram in 09/2012 showed moderate LVH, EF 65-70%, grade 1 DD, mild AI, small mobile, echodense structure on the left ventricular aspect of the anterior mitral leaflet (most likely calcification, possible old calcified vegetation cannot be excluded), moderate LAE. Dr. Gwenlyn [name] evaluated the patient in 2015 for pre-op evaluation prior to knee surgery. His note references that the patient had had documentation of atrial flutter with variable block at the [facility] in 07/2012 but patient was unaware of this. At [place] 2015, he was in NSR and no further recommendations made at that time regarding anticoagulation. He has not followed up with our office since that time and does not recall being told of further issues with atrial fib. He has been followed by the [place]. He does report they have been following his carotid disease. He had a remote bleeding ulcer over 3 decades ago without bleeding issue since. He did have 2 falls this summer which were mechanical in nature, no syncope - one landed him in the ER due to striking his head and getting tangled up in his walker. He sustained multiple skin tears.  He  presented this admission with abdominal pain, nausea, and vomiting. He was [name] to have acute cholecystitis seen on MRCP and confirmed on HIDA scan. He was also [name] to have a 2.3 cm complex cystic lesion in the pancreatic tail with enhancing central nodule consistent with cystic pancreatic neoplasm. He has been evaluated by general surgery with recommendation lap chole vs percutaneous drain based on operative risk. Conservative management of pancreatic lesion has been advised. He has also been noted to have leukocytosis, AKI, hypokalemia, and hyponatremia. Internal medicine has reached out to family to discuss anticoagulation - daughter is a Marine scientist and will be discussing amongst family and return with a decision. EGD does show acute gastritis so he is on sucralfate. Cardiology consulted for pre-op clearance. The patient denies any recent cardiac symptoms of CP, SOB, palpitations, syncope, dizziness. He has been in AF this admission without awareness of this. Rate is controlled on oral diltiazem. He is being followed by PT/OT who recommend home health and 24 hour supervision vs SNF depending on surgical plan.  Past Medical History:  Diagnosis Date   Anemia    related to frequent nosebleeds-right nostril.   Aortic insufficiency    a. mild by echo 2013.   Arthritis    arthritis -back knees, most joints   Atrial flutter ([place])    a. per report in 2013.   Breathing difficulty 11-10-12   difficult to breath if laying posteriorly, right nare difficulty   Carotid artery disease ([place])    a. Remote R CEA in the 1990s per pt.   Change in  hearing    Diabetes mellitus age 58   Dysrhythmia 07/2012   hx. Atrial Flutter x1, converted spontaneously-no ploblems.    H/O hiatal hernia    HOH (hard of hearing) 11-10-12   bilaterally   Hyperlipidemia    Hypertension    Ulcer 1995   bleeding ulcer, none since    Past Surgical History:  Procedure Laterality Date   CAROTID DUPLEX  06/30/2012    PATENT RIGHT CAROTID ENDARTERECTOMY. 1%-39% LEFT ICA. STABLE   CAROTID ENDARTERECTOMY  11/242008   right with Dacron patch angioplasty   CATARACT EXTRACTION  1998 bilateral  done 3 months apart   ESOPHAGOGASTRODUODENOSCOPY (EGD) WITH PROPOFOL N/A 06/08/2019   Procedure: ESOPHAGOGASTRODUODENOSCOPY (EGD) WITH PROPOFOL;  Surgeon: Wilford Corner, MD;  Location: [facility];  Service: Endoscopy;  Laterality: N/A;   EYE SURGERY     JOINT REPLACEMENT Right 11-18-12   KNEE BURSECTOMY  11/18/2012   Procedure: KNEE BURSECTOMY;  Surgeon: Tobi Bastos, MD;  Location: [facility];  Service: Orthopedics;  Laterality: Right;   NM MYOCAR MULTIPLE W/SPECT  10/08/2012   NORMAL STRESS NUCLEAR STUDY.   TOTAL KNEE ARTHROPLASTY  11/18/2012   Procedure: TOTAL KNEE ARTHROPLASTY;  Surgeon: Tobi Bastos, MD;  Location: [facility];  Service: Orthopedics;  Laterality: Right;   TRANSTHORACIC ECHOCARDIOGRAM  10/08/2012   MODERATE LVH. MODERATE CONCENTRIC HYPERTROPHY.EF 65 TO 70%. GRADE 1 DYSTOLIC DYSFUNCTION. ELEVATED VENTRICULAR END-DIASTOLIC FILLING PRESSURE AND LEFT ATRIAL FILLING PRESSURE. AV- MILD TO MODERATE CALCIFIED ANNULUS. MV- CALCIFIC DEGENERATION.Marland Kitchen LA-MILDLY DILATED.     Inpatient Medications: Scheduled Meds:  atorvastatin  40 mg Oral QPM   diltiazem  180 mg Oral Daily   docusate sodium  100 mg Oral BID   enoxaparin (LOVENOX) injection  40 mg Subcutaneous Q24H   gabapentin  100 mg Oral TID   insulin aspart  0-5 Units Subcutaneous QHS   insulin aspart  0-9 Units Subcutaneous TID WC   insulin detemir  35 Units Subcutaneous q morning - 10a   pantoprazole (PROTONIX) IV  40 mg Intravenous Q12H   sodium chloride flush  3 mL Intravenous Q12H   sucralfate  1 g Oral TID WC & HS   traZODone  100 mg Oral QHS   Continuous Infusions:  dextrose 5 % and 0.9% NaCl 75 mL/hr at 06/09/19 2215   piperacillin-tazobactam (ZOSYN)  IV 3.375 g (06/10/19 0908)   PRN Meds: acetaminophen **OR**  acetaminophen, fentaNYL (SUBLIMAZE) injection, ondansetron **OR** ondansetron (ZOFRAN) IV  Home Meds: Prior to Admission medications   Medication Sig Start Date End Date Taking? Authorizing Provider  acetaminophen (TYLENOL) 500 MG tablet Take 500 mg by mouth every 6 (six) hours as needed. Pain   Yes [provider]  atorvastatin (LIPITOR) 80 MG tablet Take 80 mg by mouth every evening.    Yes [provider]  diltiazem (DILACOR XR) 180 MG 24 hr capsule Take 180 mg by mouth daily.   Yes [provider]  gabapentin (NEURONTIN) 100 MG capsule Take 200-400 mg by mouth See admin instructions. 200 mg  in the morning and 400 mg at night   Yes [provider]  gemfibrozil (LOPID) 600 MG tablet Take 600 mg by mouth daily.    Yes [provider]  insulin detemir (LEVEMIR) 100 UNIT/ML injection Inject 35-45 Units into the skin See admin instructions. Takes 35 units in the morning and 35 units at night   Yes [provider]  losartan (COZAAR) 100 MG tablet Take 100 mg by mouth  daily before breakfast.    Yes [provider]  metFORMIN (GLUCOPHAGE) 1000 MG tablet Take 1,000 mg by mouth 2 (two) times daily with a meal.   Yes [provider]  Omega-3 1000 MG CAPS Take 2 g by mouth 2 (two) times daily.   Yes [provider]  omeprazole (PRILOSEC) 20 MG capsule Take 20 mg by mouth daily.   Yes [provider]  traZODone (DESYREL) 100 MG tablet Take 100 mg by mouth at bedtime as needed for sleep.    Yes [provider]    Allergies:   No Known Allergies  Social History:   Social History   Socioeconomic History   Marital status: Married    Spouse name: Not on file   Number of children: Not on file   Years of education: Not on file   Highest education level: Not on file  Occupational History   Occupation: retired  Scientist, product/process development strain: Not on file   Food insecurity    Worry: Not  on file    Inability: Not on file   Transportation needs    Medical: Not on file    Non-medical: Not on file  Tobacco Use   Smoking status: Former Smoker    Types: Cigarettes    Quit date: 10/21/1976    Years since quitting: 42.6   Smokeless tobacco: Never Used  Substance and Sexual Activity   Alcohol use: Yes    Alcohol/week: 1.0 standard drinks    Types: 1 Glasses of wine per week    Comment: 1-2 alcoholic drinks daily   Drug use: No   Sexual activity: Not on file  Lifestyle   Physical activity    Days per week: Not on file    Minutes per session: Not on file   Stress: Not on file  Relationships   Social connections    Talks on phone: Not on file    Gets together: Not on file    Attends religious service: Not on file    Active member of club or organization: Not on file    Attends meetings of clubs or organizations: Not on file    Relationship status: Not on file   Intimate partner violence    Fear of current or ex partner: Not on file    Emotionally abused: Not on file    Physically abused: Not on file    Forced sexual activity: Not on file  Other Topics Concern   Not on file  Social History Narrative   Not on file     Family History:    Family History  Problem Relation Age of Onset   Cancer Mother        Pancreatic   Heart disease Mother    Cancer Brother        pancreactic   Cancer Daughter        Breast      ROS:  Please see the history of present illness.  All other ROS reviewed and negative.     Physical Exam/Data:   Vitals:   06/09/19 1633 06/09/19 2035 06/10/19 0559 06/10/19 0916  BP: (!) 151/66 140/66 (!) 156/62 (!) 152/58  Pulse: (!) 102 93 62 93  Resp: 18 18 18 19   Temp: 98.1 F (36.7 C) 98 F (36.7 C) 98 F (36.7 C) 97.9 F (36.6 C)  TempSrc: Oral  Oral Oral  SpO2: 93% 100% 97% 95%  Weight:  Height:        Intake/Output Summary (Last 24 hours) at 06/10/2019 1538 Last data filed at 06/10/2019 1158 Gross per  24 hour  Intake 1885.45 ml  Output 400 ml  Net 1485.45 ml   Last 3 Weights 06/06/2019 04/30/2019 11/05/2013  Weight (lbs) 190 lb 190 lb 205 lb  Weight (kg) 86.183 kg 86.183 kg 92.987 kg     Body mass index is 25.77 kg/m.  General: Well developed, well nourished elderly WM, in no acute distress. Lying flat in bed without dyspnea Head: Normocephalic, atraumatic, sclera non-icteric, no xanthomas, nares are without discharge. Neck: Negative for carotid bruits. JVD not elevated. Lungs: Clear bilaterally to auscultation without wheezes, rales, or rhonchi. Breathing is unlabored. Heart: Irregularly irregular, rate controlled, with S1 S2. No murmurs, rubs, or gallops appreciated. Abdomen: Soft, non-tender, non-distended with normoactive bowel sounds. No hepatomegaly. No rebound/guarding. No obvious abdominal masses. Msk:  Strength and tone appear normal for age. Extremities: No clubbing or cyanosis. No edema.  Distal pedal pulses are 2+ and equal bilaterally. Neuro: Alert and oriented X 3. No facial asymmetry. No focal deficit. Moves all extremities spontaneously. Psych:  Responds to questions appropriately with a normal affect.  EKG:  The EKG was personally reviewed and demonstrates:  Atrial fib 95bpm, RBBB, nonspecific STT changes  Telemetry:  Telemetry was personally reviewed and demonstrates:  AF  Relevant CV Studies: Most recent pertinent cardiac studies are outlined above.  Laboratory Data:  High Sensitivity Troponin:  No results for input(s): TROPONINIHS in the last 720 hours.   Cardiac EnzymesNo results for input(s): TROPONINI in the last 168 hours. No results for input(s): TROPIPOC in the last 168 hours.  Chemistry Recent Labs  Lab 06/08/19 0655 06/09/19 0814 06/10/19 0427 06/10/19 1322  NA 132* 135 139  --   K 3.2* 3.1* 2.7* 2.9*  CL 91* 98 100  --   CO2 29 27 30   --   GLUCOSE 160* 164* 87  --   BUN 18 17 13   --   CREATININE 1.53* 1.28* 1.19  --   CALCIUM 8.9 8.5* 8.2*   --   GFRNONAA 40* 50* 55*  --   GFRAA 47* 58* >60  --   ANIONGAP 12 10 9   --     Recent Labs  Lab 06/06/19 0108  PROT 7.5  ALBUMIN 4.0  AST 15  ALT 13  ALKPHOS 91  BILITOT 1.1   Hematology Recent Labs  Lab 06/08/19 0655 06/09/19 0814 06/10/19 0427  WBC 19.2* 15.4* 11.7*  RBC 4.50 4.10* 3.82*  HGB 14.2 13.0 12.0*  HCT 42.9 39.4 36.4*  MCV 95.3 96.1 95.3  MCH 31.6 31.7 31.4  MCHC 33.1 33.0 33.0  RDW 13.5 13.4 13.5  PLT 219 229 258   BNPNo results for input(s): BNP, PROBNP in the last 168 hours.  DDimer No results for input(s): DDIMER in the last 168 hours.   Radiology/Studies:  Dg Chest 2 View  Result Date: 06/08/2019 CLINICAL DATA:  Fever. EXAM: CHEST - 2 VIEW COMPARISON:  January 10, 2017 FINDINGS: The cardiac silhouette is mildly enlarged. Calcific atherosclerotic disease of the aorta. Scattered peribronchial airspace opacities in the left lower and mid thorax. No definite pleural effusion. Chronic mild compression deformity of one of the midthoracic vertebral bodies, likely T7, stable from the prior radiograph. Soft tissues are grossly normal. IMPRESSION: Scattered peribronchial airspace opacities with central predominance in the left lower and mid thorax may represent early bronchopneumonia or developing pulmonary vascular congestion.  Electronically Signed   By: Fidela Salisbury M.D.   On: 06/08/2019 15:15   Nm Hepatobiliary Liver Func  Result Date: 06/09/2019 CLINICAL DATA:  Right upper quadrant pain and history of pancreatitis EXAM: NUCLEAR MEDICINE HEPATOBILIARY IMAGING TECHNIQUE: Sequential images of the abdomen were obtained out to 60 minutes following intravenous administration of radiopharmaceutical. RADIOPHARMACEUTICALS:  5.2 mCi Tc-50m  Choletec IV COMPARISON:  MRI from 06/08/2019, ultrasound from 06/06/2019 FINDINGS: Prompt uptake and biliary excretion of activity by the liver is seen. Biliary activity passes into small bowel, consistent with patent common bile  duct. No visualization of the gallbladder is noted in the first 60 minutes. 3 mg of morphine was then administered and repeat imaging was performed. Gallbladder again did not visualized. IMPRESSION: Nonvisualization of the gallbladder despite morphine augmentation. This is likely related to cystic duct obstruction and changes of acute cholecystitis. This would correspond with that seen on the recent MRI. These results will be called to the ordering clinician or representative by the Radiologist Assistant, and communication documented in the PACS or zVision Dashboard. Electronically Signed   By: Inez Catalina M.D.   On: 06/09/2019 16:13   Mr 3d Recon At Scanner  Result Date: 06/09/2019 CLINICAL DATA:  Abdominal pain. Pancreatic lesion seen on recent abdomen CTA. EXAM: MRI ABDOMEN WITHOUT AND WITH CONTRAST (INCLUDING MRCP) TECHNIQUE: Multiplanar multisequence MR imaging of the abdomen was performed both before and after the administration of intravenous contrast. Heavily T2-weighted images of the biliary and pancreatic ducts were obtained, and three-dimensional MRCP images were rendered by post processing. CONTRAST:  9 mL Gadavist COMPARISON:  Abdomen CT on 06/06/2019 FINDINGS: Lower chest: Mild bibasilar atelectasis and tiny right pleural effusion. Hepatobiliary: No hepatic masses identified. No evidence of steatosis. The gallbladder is distended with a small gallstone seen in the neck. Diffuse gallbladder wall thickening and contrast enhancement are seen, consistent with acute cholecystitis. No evidence biliary ductal dilatation or choledocholithiasis. Pancreas: A multiloculated cystic lesion is seen in pancreatic tail which measures 2.3 cm, and shows a small solid areas centrally which demonstrates contrast enhancement. In addition, there are other scattered tiny sub-cm cysts in the pancreatic body and tail. No evidence of main pancreatic ductal dilatation or pancreas divisum. Spleen:  Within normal limits in  size and appearance. Adrenals/Urinary Tract: No masses identified. A few tiny renal cysts are noted. No evidence of hydronephrosis. Stomach/Bowel: Diffuse colonic diverticulosis is seen, however there is no evidence of diverticulitis involving visualized portion of colon. Vascular/Lymphatic: No pathologically enlarged lymph nodes identified. No abdominal aortic aneurysm. Other:  None. Musculoskeletal:  No suspicious bone lesions identified. IMPRESSION: Positive for acute cholecystitis. No evidence of biliary ductal dilatation or choledocholithiasis. 2.3 cm complex cystic lesion in pancreatic tail with enhancing central nodule, consistent with cystic pancreatic neoplasm. Other tiny less than 1 cm cystic foci are also noted within the pancreatic body and tail. Recommend further evaluation with EUS/FNA. Bibasilar atelectasis and tiny right pleural effusion. Colonic diverticulosis. These results will be called to the ordering clinician or representative by the Radiologist Assistant, and communication documented in the PACS or zVision Dashboard. Electronically Signed   By: Marlaine Hind M.D.   On: 06/09/2019 08:56   Mr Abdomen Mrcp Moise Boring Contast  Result Date: 06/09/2019 CLINICAL DATA:  Abdominal pain. Pancreatic lesion seen on recent abdomen CTA. EXAM: MRI ABDOMEN WITHOUT AND WITH CONTRAST (INCLUDING MRCP) TECHNIQUE: Multiplanar multisequence MR imaging of the abdomen was performed both before and after the administration of intravenous contrast. Heavily T2-weighted images  of the biliary and pancreatic ducts were obtained, and three-dimensional MRCP images were rendered by post processing. CONTRAST:  9 mL Gadavist COMPARISON:  Abdomen CT on 06/06/2019 FINDINGS: Lower chest: Mild bibasilar atelectasis and tiny right pleural effusion. Hepatobiliary: No hepatic masses identified. No evidence of steatosis. The gallbladder is distended with a small gallstone seen in the neck. Diffuse gallbladder wall thickening and contrast  enhancement are seen, consistent with acute cholecystitis. No evidence biliary ductal dilatation or choledocholithiasis. Pancreas: A multiloculated cystic lesion is seen in pancreatic tail which measures 2.3 cm, and shows a small solid areas centrally which demonstrates contrast enhancement. In addition, there are other scattered tiny sub-cm cysts in the pancreatic body and tail. No evidence of main pancreatic ductal dilatation or pancreas divisum. Spleen:  Within normal limits in size and appearance. Adrenals/Urinary Tract: No masses identified. A few tiny renal cysts are noted. No evidence of hydronephrosis. Stomach/Bowel: Diffuse colonic diverticulosis is seen, however there is no evidence of diverticulitis involving visualized portion of colon. Vascular/Lymphatic: No pathologically enlarged lymph nodes identified. No abdominal aortic aneurysm. Other:  None. Musculoskeletal:  No suspicious bone lesions identified. IMPRESSION: Positive for acute cholecystitis. No evidence of biliary ductal dilatation or choledocholithiasis. 2.3 cm complex cystic lesion in pancreatic tail with enhancing central nodule, consistent with cystic pancreatic neoplasm. Other tiny less than 1 cm cystic foci are also noted within the pancreatic body and tail. Recommend further evaluation with EUS/FNA. Bibasilar atelectasis and tiny right pleural effusion. Colonic diverticulosis. These results will be called to the ordering clinician or representative by the Radiologist Assistant, and communication documented in the PACS or zVision Dashboard. Electronically Signed   By: Marlaine Hind M.D.   On: 06/09/2019 08:56    Assessment and Plan:   1. Cholecystitis, for possible surgical management.  2. Pre-operative cardiovascular examination - revised cardiac risk index is actually quite low at 0.4% indicating low risk of CV events related to surgery based on his risk factors alone. Obviously his advanced age and other comorbidities do increase  his surgical risk. It does not appear he's had any recent unstable cardiac symptoms therefore is not felt to require ischemic workup from my standpoint, but will discuss with MD.  3. Newly recognized atrial fibrillation - prior reported diagnosis of atrial flutter in 2013, but in NSR at last cardiology [place] in 2015. He does not recall being told in the interim that he had atrial fibrillation and has not previously been on anticoagulation. No easy answer regarding this - CHADSVASC 5 for HTN, age, vascular disease (carotids), DM which typically warrants anticoagulation. However, patient has history of gastritis and recent falling (including striking his head) which certainly poses a bleeding risk. In addition he has been [name] to have a possible pancreatic malignancy as well, unclear whether this would increase bleeding risk further. Per IM note, there are ongoing discussions amongst family how they feel about anticoagulation. He also has acute gastritis by EGD this admission so would need GI clearance as well. Until it is proven that he can tolerate anticoagulation, would not pursue plan for cardioversion - he is asymptomatic with controlled HR. There is room to titrate diltiazem if needed.  4. New RBBB - no evidence of bradycardia on telemetry. Follow clinically. No acute pulmonary complaints. Further management of CXR findings per IM.   5. Carotid artery disease - patient reports this is being followed by the [place].  6. Hypomagnesemia/hypokalemia - per primary team.  7. H/o mild AI - repeat echo this  admission given atrial fib.  stions or updates, please contact [place] Please consult [email or web address] for contact info under   Signed, Charlie Pitter, PA-C  06/10/2019 3:38 PM    The patient was seen, examined and discussed with Melina Copa, PA-C and I agree with the above.   85 y.o. male with a hx of Carotid artery disease s/p remote R-CEA, atrial flutter in 2013, mild AI by echo 2013, HTN, DM, HLD,  remote bleeding ulcer, recent falls with head injury, knee arthritis, hiatal hernia who is being seen today for the evaluation of pre-op clearance/atrial fib. His echocardiogram in 09/2012 showed moderate LVH, EF 65-70%, grade 1 DD, mild AI, moderate LAE.  Dr. Gwenlyn [name] evaluated the patient in 2015 for pre-op evaluation prior to knee surgery. His note references that the patient had had documentation of atrial flutter with variable block at the [facility] in 07/2012 but patient was unaware of this. At [place] 2015, he was in NSR and no further recommendations made at that time regarding anticoagulation. He has not followed up with our office since that time.He had a remote bleeding ulcer over 3 decades ago without bleeding issue since. He did have 2 falls this summer which were mechanical in nature, no syncope - one landed him in the ER due to striking his head and getting tangled up in his walker. He sustained multiple skin tears.  The patient presented with abdominal pain nausea and vomiting and was diagnosed with acute cholecystitis on MRCP confirmed by HIDA scan.  He is awaiting cardiac clearance prior to surgery.  Physical exam reveals a very pleasant gentleman, in no acute distress, no carotid bruit bilaterally, S1-S2 irregular with minimal systolic murmur, clear lungs, extremities warm with palpable pulses.  Assessment and plan: New diagnosis of atrial fibrillation Acute cholecystitis Carotid disease Hypertension  Preoperative clearance: The patient was fairly active prior to the admission and can certainly achieve at least 4 METS.  He denies any symptoms of angina and on physical exam he has no signs of fluid overload.  There is currently no contraindication from cardiac standpoint for this gentleman to undergo cholecystectomy.  New diagnosis of atrial fibrillation: Unknown chronicity, last EKG in epic from 2015 when he was in sinus rhythm, the patient is rate controlled and has no signs of heart  failure, long-term plan will be rate control.  Regarding anticoagulation , he is CHADS-VASc 5, however considering his age, prior history of GI bleed and multiple falls including head injury his risk of bleeding is higher than risk of stroke.  This was discussed with his daughter who is a Marine scientist in [place] and she agrees.  We will order baseline echocardiogram, however we do need to wait for echocardiogram prior to the surgery, this is just a baseline echocardiogram as a reference for the future.  We would recommend to start aspirin 81 mg p.o. daily sometimes in the future when it is considered to be safe from GI standpoint.  Ena Dawley, MD 06/10/2019

## 2019-06-11 ENCOUNTER — Inpatient Hospital Stay (HOSPITAL_COMMUNITY): Payer: Medicare Other | Admitting: Certified Registered Nurse Anesthetist

## 2019-06-11 ENCOUNTER — Encounter (HOSPITAL_COMMUNITY)
Admission: EM | Disposition: A | Discharge status: Home Health | Payer: Self-pay | Source: Home / Self Care | Attending: Internal Medicine

## 2019-06-11 ENCOUNTER — Encounter (HOSPITAL_COMMUNITY): Payer: Self-pay

## 2019-06-11 ENCOUNTER — Inpatient Hospital Stay (HOSPITAL_COMMUNITY): Payer: Medicare Other

## 2019-06-11 DIAGNOSIS — I480 Paroxysmal atrial fibrillation: Secondary | ICD-10-CM

## 2019-06-11 HISTORY — PX: CHOLECYSTECTOMY: SHX55

## 2019-06-11 LAB — BASIC METABOLIC PANEL
Anion gap: 10 (ref 5–15)
BUN: 6 mg/dL — ABNORMAL LOW (ref 8–23)
CO2: 28 mmol/L (ref 22–32)
Calcium: 8.5 mg/dL — ABNORMAL LOW (ref 8.9–10.3)
Chloride: 102 mmol/L (ref 98–111)
Creatinine, Ser: 1.09 mg/dL (ref 0.61–1.24)
GFR calc Af Amer: >60 mL/min (ref >60)
GFR calc non Af Amer: >60 mL/min (ref >60)
Glucose, Bld: 162 mg/dL — ABNORMAL HIGH (ref 70–99)
Potassium: 3 mmol/L — ABNORMAL LOW (ref 3.5–5.1)
Sodium: 140 mmol/L (ref 135–145)

## 2019-06-11 LAB — ECHOCARDIOGRAM COMPLETE
Height: 72 in
Weight: 3033.53 oz

## 2019-06-11 LAB — CBC
HCT: 37.8 % — ABNORMAL LOW (ref 39.0–52.0)
Hemoglobin: 12.7 g/dL — ABNORMAL LOW (ref 13.0–17.0)
MCH: 31.6 pg (ref 26.0–34.0)
MCHC: 33.6 g/dL (ref 30.0–36.0)
MCV: 94 fL (ref 80.0–100.0)
Platelets: 306 10*3/uL (ref 150–400)
RBC: 4.02 MIL/uL — ABNORMAL LOW (ref 4.22–5.81)
RDW: 13.3 % (ref 11.5–15.5)
WBC: 11.8 10*3/uL — ABNORMAL HIGH (ref 4.0–10.5)
nRBC: 0 % (ref 0.0–0.2)

## 2019-06-11 LAB — GLUCOSE, CAPILLARY
Glucose-Capillary: 64 mg/dL — ABNORMAL LOW (ref 70–99)
Glucose-Capillary: 161 mg/dL — ABNORMAL HIGH (ref 70–99)
Glucose-Capillary: 167 mg/dL — ABNORMAL HIGH (ref 70–99)
Glucose-Capillary: 192 mg/dL — ABNORMAL HIGH (ref 70–99)
Glucose-Capillary: 169 mg/dL — ABNORMAL HIGH (ref 70–99)
Glucose-Capillary: 163 mg/dL — ABNORMAL HIGH (ref 70–99)
Glucose-Capillary: 94 mg/dL (ref 70–99)

## 2019-06-11 LAB — TSH: TSH: 2.308 u[IU]/mL (ref 0.350–4.500)

## 2019-06-11 LAB — SURGICAL PCR SCREEN
MRSA, PCR: NEGATIVE
Staphylococcus aureus: NEGATIVE

## 2019-06-11 LAB — T4, FREE: Free T4: 1.17 ng/dL — ABNORMAL HIGH (ref 0.61–1.12)

## 2019-06-11 LAB — MAGNESIUM: Magnesium: 1.6 mg/dL — ABNORMAL LOW (ref 1.7–2.4)

## 2019-06-11 SURGERY — LAPAROSCOPIC CHOLECYSTECTOMY
Anesthesia: General

## 2019-06-11 MED ORDER — CEFAZOLIN SODIUM 1 G IJ SOLR
INTRAMUSCULAR | Status: AC
  Filled 2019-06-11: qty 20

## 2019-06-11 MED ORDER — ONDANSETRON HCL 4 MG/2ML IJ SOLN
INTRAMUSCULAR | Status: AC
  Filled 2019-06-11: qty 2

## 2019-06-11 MED ORDER — SUGAMMADEX SODIUM 200 MG/2ML IV SOLN
INTRAVENOUS | Status: DC | PRN
  Administered 2019-06-11: 180 mg via INTRAVENOUS

## 2019-06-11 MED ORDER — PROPOFOL 10 MG/ML IV BOLUS
INTRAVENOUS | Status: DC | PRN
  Administered 2019-06-11: 120 mg via INTRAVENOUS
  Administered 2019-06-11 (×2): 40 mg via INTRAVENOUS

## 2019-06-11 MED ORDER — CEFAZOLIN SODIUM-DEXTROSE 2-3 GM-%(50ML) IV SOLR
INTRAVENOUS | Status: DC | PRN
  Administered 2019-06-11: 2 g via INTRAVENOUS

## 2019-06-11 MED ORDER — POTASSIUM CHLORIDE 10 MEQ/100ML IV SOLN: 10.0000 meq | INTRAVENOUS | Status: AC

## 2019-06-11 MED ORDER — PROPOFOL 10 MG/ML IV BOLUS
INTRAVENOUS | Status: AC
  Filled 2019-06-11: qty 20

## 2019-06-11 MED ORDER — FENTANYL CITRATE (PF) 100 MCG/2ML IJ SOLN: 25.0000 ug | INTRAMUSCULAR | Status: DC | PRN

## 2019-06-11 MED ORDER — OXYCODONE HCL 5 MG PO TABS: 5.0000 mg | ORAL_TABLET | Freq: Once | ORAL | Status: DC | PRN

## 2019-06-11 MED ORDER — CHLORHEXIDINE GLUCONATE CLOTH 2 % EX PADS: 6.0000 | MEDICATED_PAD | Freq: Once | CUTANEOUS | Status: DC

## 2019-06-11 MED ORDER — LACTATED RINGERS IV SOLN
INTRAVENOUS | Status: DC | PRN
  Administered 2019-06-11: 12:00:00 via INTRAVENOUS

## 2019-06-11 MED ORDER — SODIUM CHLORIDE 0.9 % IR SOLN
Status: DC | PRN
  Administered 2019-06-11: 1000 mL

## 2019-06-11 MED ORDER — BUPIVACAINE-EPINEPHRINE 0.25% -1:200000 IJ SOLN
INTRAMUSCULAR | Status: DC | PRN
  Administered 2019-06-11: 8 mL

## 2019-06-11 MED ORDER — ESMOLOL HCL 100 MG/10ML IV SOLN
INTRAVENOUS | Status: DC | PRN
  Administered 2019-06-11: 20 mg via INTRAVENOUS

## 2019-06-11 MED ORDER — PROMETHAZINE HCL 25 MG/ML IJ SOLN: 6.2500 mg | INTRAMUSCULAR | Status: DC | PRN

## 2019-06-11 MED ORDER — ROCURONIUM BROMIDE 10 MG/ML (PF) SYRINGE
PREFILLED_SYRINGE | INTRAVENOUS | Status: DC | PRN
  Administered 2019-06-11: 50 mg via INTRAVENOUS

## 2019-06-11 MED ORDER — LIDOCAINE 2% (20 MG/ML) 5 ML SYRINGE
INTRAMUSCULAR | Status: DC | PRN
  Administered 2019-06-11 (×2): 40 mg via INTRAVENOUS

## 2019-06-11 MED ORDER — FENTANYL CITRATE (PF) 250 MCG/5ML IJ SOLN
INTRAMUSCULAR | Status: DC | PRN
  Administered 2019-06-11 (×3): 50 ug via INTRAVENOUS

## 2019-06-11 MED ORDER — DEXAMETHASONE SODIUM PHOSPHATE 10 MG/ML IJ SOLN
INTRAMUSCULAR | Status: DC | PRN
  Administered 2019-06-11: 5 mg via INTRAVENOUS

## 2019-06-11 MED ORDER — MAGNESIUM SULFATE 4 GM/100ML IV SOLN
4.0000 g | Freq: Once | INTRAVENOUS | Status: AC
  Administered 2019-06-12: 4 g via INTRAVENOUS
  Filled 2019-06-11: qty 100

## 2019-06-11 MED ORDER — DEXTROSE 50 % IV SOLN
INTRAVENOUS | Status: AC
  Filled 2019-06-11: qty 50

## 2019-06-11 MED ORDER — FENTANYL CITRATE (PF) 250 MCG/5ML IJ SOLN
INTRAMUSCULAR | Status: AC
  Filled 2019-06-11: qty 5

## 2019-06-11 MED ORDER — 0.9 % SODIUM CHLORIDE (POUR BTL) OPTIME
TOPICAL | Status: DC | PRN
  Administered 2019-06-11: 1000 mL

## 2019-06-11 MED ORDER — ROCURONIUM BROMIDE 10 MG/ML (PF) SYRINGE
PREFILLED_SYRINGE | INTRAVENOUS | Status: AC
  Filled 2019-06-11: qty 10

## 2019-06-11 MED ORDER — INSULIN DETEMIR 100 UNIT/ML ~~LOC~~ SOLN
20.0000 [IU] | Freq: Every morning | SUBCUTANEOUS | Status: DC
  Administered 2019-06-11 – 2019-06-15 (×5): 20 [IU] via SUBCUTANEOUS
  Filled 2019-06-11 (×5): qty 0.2

## 2019-06-11 MED ORDER — DEXAMETHASONE SODIUM PHOSPHATE 10 MG/ML IJ SOLN
INTRAMUSCULAR | Status: AC
  Filled 2019-06-11: qty 1

## 2019-06-11 MED ORDER — CHLORHEXIDINE GLUCONATE CLOTH 2 % EX PADS
6.0000 | MEDICATED_PAD | Freq: Once | CUTANEOUS | Status: AC
  Administered 2019-06-11: 6 via TOPICAL

## 2019-06-11 MED ORDER — LIDOCAINE 2% (20 MG/ML) 5 ML SYRINGE
INTRAMUSCULAR | Status: AC
  Filled 2019-06-11: qty 5

## 2019-06-11 MED ORDER — POTASSIUM CHLORIDE 10 MEQ/100ML IV SOLN
10.0000 meq | INTRAVENOUS | Status: AC
  Administered 2019-06-11: 10 meq via INTRAVENOUS
  Filled 2019-06-11: qty 100

## 2019-06-11 MED ORDER — PHENYLEPHRINE 40 MCG/ML (10ML) SYRINGE FOR IV PUSH (FOR BLOOD PRESSURE SUPPORT)
PREFILLED_SYRINGE | INTRAVENOUS | Status: DC | PRN
  Administered 2019-06-11: 80 ug via INTRAVENOUS
  Administered 2019-06-11: 120 ug via INTRAVENOUS

## 2019-06-11 MED ORDER — OXYCODONE HCL 5 MG/5ML PO SOLN: 5.0000 mg | Freq: Once | ORAL | Status: DC | PRN

## 2019-06-11 MED ORDER — ACETAMINOPHEN 10 MG/ML IV SOLN: 1000.0000 mg | Freq: Once | INTRAVENOUS | Status: DC | PRN

## 2019-06-11 MED ORDER — DEXTROSE 50 % IV SOLN
12.5000 g | INTRAVENOUS | Status: AC
  Administered 2019-06-11: 12.5 g via INTRAVENOUS

## 2019-06-11 SURGICAL SUPPLY — 44 items
APL PRP STRL LF DISP 70% ISPRP (MISCELLANEOUS) ×1
APPLIER CLIP ROT 10 11.4 M/L (STAPLE) ×3
APR CLP MED LRG 11.4X10 (STAPLE) ×1
BLADE CLIPPER SURG (BLADE) IMPLANT
CANISTER SUCT 3000ML PPV (MISCELLANEOUS) ×3 IMPLANT
CHLORAPREP W/TINT 26 (MISCELLANEOUS) ×3 IMPLANT
CLIP APPLIE ROT 10 11.4 M/L (STAPLE) ×1 IMPLANT
COVER SURGICAL LIGHT HANDLE (MISCELLANEOUS) ×3 IMPLANT
COVER WAND RF STERILE (DRAPES) IMPLANT
DERMABOND ADVANCED (GAUZE/BANDAGES/DRESSINGS) ×2
DERMABOND ADVANCED .7 DNX12 (GAUZE/BANDAGES/DRESSINGS) ×1 IMPLANT
DRAIN CHANNEL 19F RND (DRAIN) ×3 IMPLANT
DRAPE WARM FLUID 44X44 (DRAPES) IMPLANT
ELECT REM PT RETURN 9FT ADLT (ELECTROSURGICAL) ×3
ELECTRODE REM PT RTRN 9FT ADLT (ELECTROSURGICAL) ×1 IMPLANT
EVACUATOR SILICONE 100CC (DRAIN) ×3 IMPLANT
GLOVE BIO SURGEON STRL SZ8 (GLOVE) ×3 IMPLANT
GLOVE BIOGEL PI IND STRL 8 (GLOVE) ×1 IMPLANT
GLOVE BIOGEL PI INDICATOR 8 (GLOVE) ×2
GOWN STRL REUS W/ TWL LRG LVL3 (GOWN DISPOSABLE) ×2 IMPLANT
GOWN STRL REUS W/ TWL XL LVL3 (GOWN DISPOSABLE) ×1 IMPLANT
GOWN STRL REUS W/TWL LRG LVL3 (GOWN DISPOSABLE) ×6
GOWN STRL REUS W/TWL XL LVL3 (GOWN DISPOSABLE) ×3
KIT BASIN OR (CUSTOM PROCEDURE TRAY) ×3 IMPLANT
KIT TURNOVER KIT B (KITS) ×3 IMPLANT
NS IRRIG 1000ML POUR BTL (IV SOLUTION) ×3 IMPLANT
PAD ARMBOARD 7.5X6 YLW CONV (MISCELLANEOUS) ×3 IMPLANT
POUCH RETRIEVAL ECOSAC 10 (ENDOMECHANICALS) ×1 IMPLANT
POUCH RETRIEVAL ECOSAC 10MM (ENDOMECHANICALS) ×2
SCISSORS LAP 5X35 DISP (ENDOMECHANICALS) ×3 IMPLANT
SET IRRIG TUBING LAPAROSCOPIC (IRRIGATION / IRRIGATOR) ×3 IMPLANT
SET TUBE SMOKE EVAC HIGH FLOW (TUBING) ×3 IMPLANT
SLEEVE ENDOPATH XCEL 5M (ENDOMECHANICALS) ×3 IMPLANT
SPECIMEN JAR SMALL (MISCELLANEOUS) ×3 IMPLANT
SUT ETHILON 2 0 FS 18 (SUTURE) ×3 IMPLANT
SUT MNCRL AB 4-0 PS2 18 (SUTURE) ×3 IMPLANT
SUT VICRYL 0 UR6 27IN ABS (SUTURE) ×3 IMPLANT
TOWEL GREEN STERILE (TOWEL DISPOSABLE) ×3 IMPLANT
TOWEL GREEN STERILE FF (TOWEL DISPOSABLE) ×3 IMPLANT
TRAY LAPAROSCOPIC MC (CUSTOM PROCEDURE TRAY) ×3 IMPLANT
TROCAR XCEL BLUNT TIP 100MML (ENDOMECHANICALS) ×3 IMPLANT
TROCAR XCEL NON-BLD 11X100MML (ENDOMECHANICALS) ×3 IMPLANT
TROCAR XCEL NON-BLD 5MMX100MML (ENDOMECHANICALS) ×3 IMPLANT
WATER STERILE IRR 1000ML POUR (IV SOLUTION) ×3 IMPLANT

## 2019-06-11 NOTE — Anesthesia Postprocedure Evaluation (Signed)
Anesthesia Post Note  Patient: [name]  Procedure(s) Performed: LAPAROSCOPIC CHOLECYSTECTOMY (N/A )     Patient location during evaluation: PACU Anesthesia Type: General Level of consciousness: awake and alert Pain management: pain level controlled Vital Signs Assessment: post-procedure vital signs reviewed and stable Respiratory status: spontaneous breathing, nonlabored ventilation and respiratory function stable Cardiovascular status: blood pressure returned to baseline and stable Postop Assessment: no apparent nausea or vomiting Anesthetic complications: no    Last Vitals:  Vitals:   06/11/19 1429 06/11/19 1430  BP: (!) 163/77   Pulse: 87 87  Resp: 17 16  Temp:  36.6 C  SpO2: 99% 100%    Last Pain:  Vitals:   06/11/19 0827  TempSrc: Oral  PainSc:                  Audry Pili

## 2019-06-11 NOTE — Progress Notes (Signed)
PROGRESS NOTE    [name]  [id number] DOB: [birth year] DOA: 06/06/2019 PCP: [name], MD   Brief Narrative:  HPI on 06/06/2019 by Dr. Karmen Bongo [name] is a 85 y.o. male with medical history significant of HTN; HLD; and DM presenting as transfer from [facility] for n/v.  He developed abdominal pain yesterday before lunch.  It worsened and he developed n/v.  Epigastric TTP.  His last emesis was last night about 11PM.  He is still having pain.  He had a normal BM yesterday AM.  He does not have chest pain, no palpitations.  Denies known h/o afib.  I spoke with his wife.  He had knee replacement in 2014.  Dr. Gwenlyn Found did a pre-op examination and he did have afib at that time.  He is taking Diltiazem.   His wife has afib and was previously on Plavix but now has a pacer.  He has never been on [facility].  His wife requests that I discuss AC with their RN daughter, Yuxuan Spadaro, teaches nursing at [employer], [phone].   He has a h/o gastritis and pancreatitis.  He did a bleeding ulcer in 1978.  The last time he fell, he hit his head hard and hard a big gash on his head.  He felt like his hip gave out, which has happened before.  She would like to discuss this further with her parents and siblings and will let us know.  Interim history Patient admitted with abdominal pain.  Gastroenterology consulted and appreciated, status post EGD showing mild gastritis.  MRCP and HIDA showed acute cholecystitis.  General surgery consulted. Cardiology consulted for clearance.  Assessment & Plan   Abdominal pain/acute cholecystitis -Gastroenterology consulted and appreciated -EGD showed mild gastritis otherwise normal EGD -CTA A/P reveals hypodense lesion in the body of pancreas -GI recommended MRCP-positive for acute cholecystitis.  No evidence of biliary ductal dilatation or choledocholithiasis.  2.3 cm complex cystic lesion in the pancreatic tail with enhancing central nodule consistent with cystic  pancreatic neoplasm -GI now recommends HIDA scan with conservative management. -HIDA scan showed nonvisualization of gallbladder despite morphine augmentation, likely related to cystic duct obstruction and changes of acute cholecystitis. -Continue PPI, pain control, antiemetics, IV fluid -General surgery consulted and appreciated has requested cardiology clearance- per cardiology: "There is currently no contraindication from cardiac standpoint for this gentleman to undergo cholecystectomy." -pending further recommendations from surgery today  Paroxysmal atrial fibrillation -Patient states he has seen Dr. Alvester Chou in the past -Currently not on anticoagulation due to history of falls and hitting his head -Currently rate controlled on diltiazem -Cardiology would like to obtain a baseline echocardiogram and would like to start aspirin in the future if ok with gastroenterology   Acute kidney injury/dehydration -Likely due to persistent nausea and vomiting prior to admission -Improved, Creatinine currently 1.09 -Continue IV fluids  Essential hypertension -Continue Cardizem -HCTZ held  Hyperlipidemia -Continue statin  Diabetes mellitus, type II -Continue Levemir, insulin sliding scale with CBG monitoring -Metformin held  Hypokalemia/hypomagnesemia  -Replace and continue to monitor   DVT Prophylaxis Lovenox  Code Status: DNR  Family Communication: None at bedside  Disposition Plan: Admitted.  Pending further recommendations from surgery  Consultants Gastroenterology General surgery Cardiology  Procedures  EGD MRCP HIDA Echocardiogram  Antibiotics   Anti-infectives (From admission, onward)   Start     Dose/Rate Route Frequency Ordered Stop   06/09/19 2300  [MAR Hold]  piperacillin-tazobactam (ZOSYN) IVPB 3.375 g     (MAR Hold  since Fri 06/11/2019 at 1109.Hold Reason: Transfer to a Procedural area.)   3.375 g 12.5 mL/hr over 240 Minutes Intravenous Every 8 hours 06/09/19  2221     06/06/19 0730  piperacillin-tazobactam (ZOSYN) IVPB 3.375 g     3.375 g 100 mL/hr over 30 Minutes Intravenous  Once 06/06/19 0715 06/06/19 [id number]      Subjective:   [name] seen and examined today.  Patient denies any abdominal pain, nausea or vomiting, diarrhea or constipation at this time.  Would like to move forward with surgery.  Denies current chest pain or shortness of breath. Objective:   Vitals:   06/10/19 1712 06/10/19 2034 06/11/19 0306 06/11/19 0827  BP: (!) 158/67 (!) 151/59 136/72 (!) 155/72  Pulse: 84 73 84 86  Resp: 18 18 16 18   Temp: (!) 97.5 F (36.4 C) 98.6 F (37 C) 98.5 F (36.9 C) 98.9 F (37.2 C)  TempSrc: Oral Oral Oral Oral  SpO2: 100% 98% 95% 99%  Weight:  86 kg    Height:        Intake/Output Summary (Last 24 hours) at 06/11/2019 1110 Last data filed at 06/11/2019 0945 Gross per 24 hour  Intake 1778.96 ml  Output 1151 ml  Net 627.96 ml   Filed Weights   06/06/19 0025 06/10/19 2034  Weight: 86.2 kg 86 kg   Exam  General: Well developed, well nourished, NAD, appears stated age  HEENT: NCAT, mucous membranes moist.   Cardiovascular: S1 S2 auscultated, RRR, no murmur  Respiratory: Clear to auscultation bilaterally with equal chest rise  Abdomen: Soft, TTP on right, nondistended, + bowel sounds  Extremities: warm dry without cyanosis clubbing or edema  Neuro: AAOx3, nonfocal  Psych: Pleasant, appropriate mood and affect  Data Reviewed: I have personally reviewed following labs and imaging studies  CBC: Recent Labs  Lab 06/06/19 0043 06/07/19 0749 06/08/19 0655 06/09/19 0814 06/10/19 0427 06/11/19 0642  WBC 13.5* 19.1* 19.2* 15.4* 11.7* 11.8*  NEUTROABS 11.8*  --   --   --   --   --   HGB 13.6 13.7 14.2 13.0 12.0* 12.7*  HCT 41.4 40.6 42.9 39.4 36.4* 37.8*  MCV 96.5 95.1 95.3 96.1 95.3 94.0  PLT 230 230 219 229 258 [id number]   Basic Metabolic Panel: Recent Labs  Lab 06/07/19 0749 06/08/19 0655 06/09/19 0814 06/10/19  0427 06/10/19 1322 06/11/19 0642 06/11/19 0703  NA 136 132* 135 139  --  140  --   K 3.5 3.2* 3.1* 2.7* 2.9* 3.0*  --   CL 94* 91* 98 100  --  102  --   CO2 29 29 27 30   --  28  --   GLUCOSE 146* 160* 164* 87  --  162*  --   BUN 13 18 17 13   --  6*  --   CREATININE 1.10 1.53* 1.28* 1.19  --  1.09  --   CALCIUM 9.4 8.9 8.5* 8.2*  --  8.5*  --   MG  --   --   --   --  1.6*  --  1.6*   GFR: Estimated Creatinine Clearance: 52.4 mL/min (by C-G formula based on SCr of 1.09 mg/dL). Liver Function Tests: Recent Labs  Lab 06/06/19 0108  AST 15  ALT 13  ALKPHOS 91  BILITOT 1.1  PROT 7.5  ALBUMIN 4.0   Recent Labs  Lab 06/06/19 0108  LIPASE 21   No results for input(s): AMMONIA in the last 168 hours. Coagulation  Profile: No results for input(s): INR, PROTIME in the last 168 hours. Cardiac Enzymes: No results for input(s): CKTOTAL, CKMB, CKMBINDEX, TROPONINI in the last 168 hours. BNP (last 3 results) No results for input(s): PROBNP in the last 8760 hours. HbA1C: No results for input(s): HGBA1C in the last 72 hours. CBG: Recent Labs  Lab 06/10/19 2120 06/10/19 2205 06/11/19 0305 06/11/19 0337 06/11/19 0654  GLUCAP 66* 76 64* 161* 167*   Lipid Profile: No results for input(s): CHOL, HDL, LDLCALC, TRIG, CHOLHDL, LDLDIRECT in the last 72 hours. Thyroid Function Tests: Recent Labs    06/11/19 0642  TSH 2.308  FREET4 1.17*   Anemia Panel: No results for input(s): VITAMINB12, FOLATE, FERRITIN, TIBC, IRON, RETICCTPCT in the last 72 hours. Urine analysis:    Component Value Date/Time   COLORURINE YELLOW 06/06/2019 0043   APPEARANCEUR CLEAR 06/06/2019 0043   LABSPEC >1.030 (H) 06/06/2019 0043   PHURINE 6.5 06/06/2019 0043   GLUCOSEU >=500 (A) 06/06/2019 0043   HGBUR TRACE (A) 06/06/2019 0043   BILIRUBINUR NEGATIVE 06/06/2019 0043   KETONESUR 40 (A) 06/06/2019 0043   PROTEINUR >300 (A) 06/06/2019 0043   UROBILINOGEN 0.2 11/10/2012 1432   NITRITE NEGATIVE  06/06/2019 0043   LEUKOCYTESUR NEGATIVE 06/06/2019 0043   Sepsis Labs: @LABRCNTIP (procalcitonin:4,lacticidven:4)  ) Recent Results (from the past 240 hour(s))  SARS CORONAVIRUS 2 Nasal Swab Aptima Multi Swab     Status: None   Collection Time: 06/06/19  2:03 AM   Specimen: Aptima Multi Swab; Nasal Swab  Result Value Ref Range Status   SARS Coronavirus 2 NEGATIVE NEGATIVE Final    Comment: (NOTE) SARS-CoV-2 target nucleic acids are NOT DETECTED. The SARS-CoV-2 RNA is generally detectable in upper and lower respiratory specimens during the acute phase of infection. Negative results do not preclude SARS-CoV-2 infection, do not rule out co-infections with other pathogens, and should not be used as the sole basis for treatment or other patient management decisions. Negative results must be combined with clinical observations, patient history, and epidemiological information. The expected result is Negative. Fact Sheet for Patients: SugarRoll.be Fact Sheet for Healthcare Providers: https://www.woods-mathews.com/ This test is not yet approved or cleared by the [place] FDA and  has been authorized for detection and/or diagnosis of SARS-CoV-2 by FDA under an Emergency Use Authorization (EUA). This EUA will remain  in effect (meaning this test can be used) for the duration of the COVID-19 declaration under Section 56 4(b)(1) of the Act, 21 U.S.C. section 360bbb-3(b)(1), unless the authorization is terminated or revoked sooner. Performed at [facility], [place] [address] [place], [place] [address]   Surgical pcr screen     Status: None   Collection Time: 06/11/19  5:18 AM   Specimen: Nasal Mucosa; Nasal Swab  Result Value Ref Range Status   MRSA, PCR NEGATIVE NEGATIVE Final   Staphylococcus aureus NEGATIVE NEGATIVE Final    Comment: (NOTE) The Xpert SA Assay (FDA approved for NASAL specimens in patients 13 years of age and older),  is one component of a comprehensive surveillance program. It is not intended to diagnose infection nor to guide or monitor treatment. Performed at [facility], [place] [address] [place], [place] [address]       Radiology Studies: Nm Hepatobiliary Liver Func  Result Date: 06/09/2019 CLINICAL DATA:  Right upper quadrant pain and history of pancreatitis EXAM: NUCLEAR MEDICINE HEPATOBILIARY IMAGING TECHNIQUE: Sequential images of the abdomen were obtained out to 60 minutes following intravenous administration of radiopharmaceutical. RADIOPHARMACEUTICALS:  5.2 mCi Tc-11m  Choletec IV COMPARISON:  MRI from 06/08/2019, ultrasound from 06/06/2019 FINDINGS: Prompt uptake and biliary excretion of activity by the liver is seen. Biliary activity passes into small bowel, consistent with patent common bile duct. No visualization of the gallbladder is noted in the first 60 minutes. 3 mg of morphine was then administered and repeat imaging was performed. Gallbladder again did not visualized. IMPRESSION: Nonvisualization of the gallbladder despite morphine augmentation. This is likely related to cystic duct obstruction and changes of acute cholecystitis. This would correspond with that seen on the recent MRI. These results will be called to the ordering clinician or representative by the Radiologist Assistant, and communication documented in the PACS or zVision Dashboard. Electronically Signed   By: Inez Catalina M.D.   On: 06/09/2019 16:13     Scheduled Meds: . [MAR Hold] atorvastatin  40 mg Oral QPM  . dextrose      . [MAR Hold] diltiazem  180 mg Oral Daily  . [MAR Hold] docusate sodium  100 mg Oral BID  . [MAR Hold] enoxaparin (LOVENOX) injection  40 mg Subcutaneous Q24H  . [MAR Hold] gabapentin  100 mg Oral TID  . [MAR Hold] insulin aspart  0-5 Units Subcutaneous QHS  . [MAR Hold] insulin aspart  0-9 Units Subcutaneous TID WC  . [MAR Hold] insulin detemir  20 Units Subcutaneous q morning - 10a  .  [MAR Hold] pantoprazole (PROTONIX) IV  40 mg Intravenous Q12H  . [MAR Hold] sodium chloride flush  3 mL Intravenous Q12H  . [MAR Hold] sucralfate  1 g Oral TID WC & HS  . [MAR Hold] traZODone  100 mg Oral QHS   Continuous Infusions: . dextrose 5 % and 0.9% NaCl 75 mL/hr at 06/10/19 2128  . [MAR Hold] piperacillin-tazobactam (ZOSYN)  IV 3.375 g (06/11/19 0556)  . potassium chloride 10 mEq (06/11/19 1014)     LOS: 4 days   Time Spent in minutes   30 minutes  Mellany Dinsmore D.O. on 06/11/2019 at 11:10 AM  Between 7am to 7pm - Please see pager noted on [email or web address]  After 7pm go to [email or web address]  And look for the night coverage person covering for me after hours  [facility] Office  [phone]

## 2019-06-11 NOTE — Progress Notes (Signed)
[facility] Progress Note  3 Days Post-Op  Subjective: CC-  Feels about the same as yesterday. Continues to complain of upper abdominal pain. Denies n/v.  WBC still elevated 11.8, afebrile Cleared by cardiology for surgery.  Objective: Vital signs in last 24 hours: Temp:  [97.5 F (36.4 C)-98.6 F (37 C)] 98.5 F (36.9 C) (08/21 0306) Pulse Rate:  [73-93] 84 (08/21 0306) Resp:  [16-19] 16 (08/21 0306) BP: (136-158)/(58-72) 136/72 (08/21 0306) SpO2:  [95 %-100 %] 95 % (08/21 0306) Weight:  [86 kg] 86 kg (08/20 2034) Last BM Date: 06/10/19  Intake/Output from previous day: 08/20 0701 - 08/21 0700 In: 2343.9 [P.O.:1320; I.V.:873.3; IV Piggyback:150.6] Out: 1001 [Urine:1000; Stool:1] Intake/Output this shift: No intake/output data recorded.  PE: Gen:  Alert, NAD, pleasant HEENT: EOM's intact, pupils equal and round Card: irregular Pulm:  CTAB, no W/R/R, rate and effort normal Abd: Soft, ND, +BS, no HSM, mild right upper and lower TTP without rebound or guarding Psych: A&Ox3 Skin: no rashes noted, warm and dry   Lab Results:  Recent Labs    06/10/19 0427 06/11/19 0642  WBC 11.7* 11.8*  HGB 12.0* 12.7*  HCT 36.4* 37.8*  PLT 258 306   BMET Recent Labs    06/10/19 0427 06/10/19 1322 06/11/19 0642  NA 139  --  140  K 2.7* 2.9* 3.0*  CL 100  --  102  CO2 30  --  28  GLUCOSE 87  --  162*  BUN 13  --  6*  CREATININE 1.19  --  1.09  CALCIUM 8.2*  --  8.5*   PT/INR No results for input(s): LABPROT, INR in the last 72 hours. CMP     Component Value Date/Time   NA 140 06/11/2019 0642   K 3.0 (L) 06/11/2019 0642   CL 102 06/11/2019 0642   CO2 28 06/11/2019 0642   GLUCOSE 162 (H) 06/11/2019 0642   BUN 6 (L) 06/11/2019 0642   CREATININE 1.09 06/11/2019 0642   CALCIUM 8.5 (L) 06/11/2019 0642   PROT 7.5 06/06/2019 0108   ALBUMIN 4.0 06/06/2019 0108   AST 15 06/06/2019 0108   ALT 13 06/06/2019 0108   ALKPHOS 91 06/06/2019 0108   BILITOT 1.1  06/06/2019 0108   GFRNONAA >60 06/11/2019 0642   GFRAA >60 06/11/2019 0642   Lipase     Component Value Date/Time   LIPASE 21 06/06/2019 0108       Studies/Results: Nm Hepatobiliary Liver Func  Result Date: 06/09/2019 CLINICAL DATA:  Right upper quadrant pain and history of pancreatitis EXAM: NUCLEAR MEDICINE HEPATOBILIARY IMAGING TECHNIQUE: Sequential images of the abdomen were obtained out to 60 minutes following intravenous administration of radiopharmaceutical. RADIOPHARMACEUTICALS:  5.2 mCi Tc-6m  Choletec IV COMPARISON:  MRI from 06/08/2019, ultrasound from 06/06/2019 FINDINGS: Prompt uptake and biliary excretion of activity by the liver is seen. Biliary activity passes into small bowel, consistent with patent common bile duct. No visualization of the gallbladder is noted in the first 60 minutes. 3 mg of morphine was then administered and repeat imaging was performed. Gallbladder again did not visualized. IMPRESSION: Nonvisualization of the gallbladder despite morphine augmentation. This is likely related to cystic duct obstruction and changes of acute cholecystitis. This would correspond with that seen on the recent MRI. These results will be called to the ordering clinician or representative by the Radiologist Assistant, and communication documented in the PACS or zVision Dashboard. Electronically Signed   By: Inez Catalina M.D.   On:  06/09/2019 16:13    Anti-infectives: Anti-infectives (From admission, onward)   Start     Dose/Rate Route Frequency Ordered Stop   06/09/19 2300  piperacillin-tazobactam (ZOSYN) IVPB 3.375 g     3.375 g 12.5 mL/hr over 240 Minutes Intravenous Every 8 hours 06/09/19 2221     06/06/19 0730  piperacillin-tazobactam (ZOSYN) IVPB 3.375 g     3.375 g 100 mL/hr over 30 Minutes Intravenous  Once 06/06/19 0715 06/06/19 0817       Assessment/Plan Diabetes HTN HLD A. Fib. Not on anticoagulation Pancreatic tail neoplasm Code status DNR AKI -  resolved Hypokalemia - K 3, replace  Acute cholecystitis - HIDA + - cleared by cardiology for surgery - Will plan for laparoscopic cholecystectomy today. Continue IV zosyn and keep NPO.  ID - zosyn 8/19>>day#3 FEN - IVF, NPO VTE - SCDs, lovenox Foley - none Follow up - TBD    LOS: 4 days    Wellington Hampshire , [facility] 06/11/2019, 7:55 AM Pager: [phone] Mon-Thurs 7:00 am-4:30 pm Fri 7:00 am -11:30 AM Sat-Sun 7:00 am-11:30 am

## 2019-06-11 NOTE — Significant Event (Signed)
Rapid Response Event Note  Overview: Abdominal Distention - Post Op Lap Chole  Initial Focused Assessment: Called to assess the patient for abdominal distention. Upon arrival, upper portion of the abdomen was distended and slightly taut, lower abdomen was soft. Upper abdomen - hypoactive bowel sounds and lower abdomen sounds - normal bowel sounds. Skin warm and dry. JP site intact - serosanguinous drainage. SBP in the 170-180s, HR 90-100s, 95% on RA, RR - 24 - labored at first while laying in bed.   After sitting the patient on the side of the bed, he was able to burp and after a few minutes of sitting up on the side of the bed, patient was then transferred to the chair. Breathing improved after patient was sitting upright in the chair.    RN paged CCS MD for KUB orders, awaiting page back  Interventions: - Mobilized OOB to chair   Plan of Care: - Promote ambulation early - Pain control - start with low doses of narcotics if needed and as ordered given patient's age and co-morbidities.  - Encourage IS  Event Summary:  Call Time [place] [id number] End Time 1905   Nilo Fallin R

## 2019-06-11 NOTE — Op Note (Signed)
Laparoscopic Cholecystectomy subtotal  Procedure Note  Indications: This patient presents with symptomatic gallbladder disease and will undergo laparoscopic cholecystectomy.The procedure has been discussed with the patient. Operative and non operative treatments have been discussed. Risks of surgery include bleeding, infection,  Common bile duct injury,  Injury to the stomach,liver, colon,small intestine, abdominal wall,  Diaphragm,  Major blood vessels,  And the need for an open procedure.  Other risks include worsening of medical problems, death,  DVT and pulmonary embolism, and cardiovascular events.   Medical options have also been discussed. The patient has been informed of long term expectations of surgery and non surgical options,  The patient agrees to proceed.    Pre-operative Diagnosis: Acute and chronic cholecystitis  Post-operative Diagnosis: Same with gangrene of the gallbladder   Surgeon: Turner Daniels   MD   Assistants: Dr Dema Severin MD   Anesthesia: General endotracheal anesthesia and Local anesthesia 0.25.% bupivacaine, with epinephrine  ASA Class: 3  Procedure Details  The patient was seen again in the Holding Room. The risks, benefits, complications, treatment options, and expected outcomes were discussed with the patient. The possibilities of reaction to medication, pulmonary aspiration, perforation of viscus, bleeding, recurrent infection, finding a normal gallbladder, the need for additional procedures, failure to diagnose a condition, the possible need to convert to an open procedure, and creating a complication requiring transfusion or operation were discussed with the patient. The patient and/or family concurred with the proposed plan, giving informed consent. The site of surgery properly noted/marked. The patient was taken to Operating Room, identified as [name] and the procedure verified as Laparoscopic Cholecystectomy with Intraoperative Cholangiograms. A Time Out was  held and the above information confirmed.  Prior to the induction of general anesthesia, antibiotic prophylaxis was administered. General endotracheal anesthesia was then administered and tolerated well. After the induction, the abdomen was prepped in the usual sterile fashion. The patient was positioned in the supine position with the left arm comfortably tucked, along with some reverse Trendelenburg.  Local anesthetic agent was injected into the skin near the umbilicus and an incision made. The midline fascia was incised and the Hasson technique was used to introduce a 12 mm port under direct vision. It was secured with a figure of eight Vicryl suture placed in the usual fashion. Pneumoperitoneum was then created with CO2 and tolerated well without any adverse changes in the patient's vital signs. Additional trocars were introduced under direct vision with an 11 mm trocar in the epigastrium and 2 5 mm trocars in the right upper quadrant. All skin incisions were infiltrated with a local anesthetic agent before making the incision and placing the trocars.   The gallbladder was identified, the fundus grasped and retracted cephalad. Adhesions were lysed bluntly and with the electrocautery where indicated, taking care not to injure any adjacent organs or viscus.  There was  severe inflammation involving the body of the gallbladder.  There were  signs of gangrene.  The anatomy at the infundibulum was not easily identified therefore opted for subtotal cholecystectomy given obscured landmarks and severe acute on chronic inflammation with gangrene.  The gallbladder was divided mid body.  It was debrided away with the posterior wall left intact.  Posterior wall was fulgurated with cautery.  There is no bile leakage from the residual gallbladder tissue.  This was placed into a extraction bag.  It was extracted.  19 round drain placed into the gallbladder fossa.  This is to the most lateral port site and  secured with 2  0 NYLON.    Pneumoperitoneum was completely reduced after viewing removal of the trocars under direct vision. The wound was thoroughly irrigated and the fascia was then closed with a figure of eight suture; the skin was then closed with  4- 0 monocryl  and a sterile dressing was applied.  Instrument, sponge, and needle counts were correct at closure and at the conclusion of the case.   Findings: Cholecystitis with Cholelithiasis  Estimated Blood Loss: less than 50 mL         Drains: 19 ROUND          Total IV Fluids: per record          Specimens: Gallbladder           Complications: None; patient tolerated the procedure well.         Disposition: PACU - hemodynamically stable.         Condition: stable

## 2019-06-11 NOTE — Progress Notes (Signed)
Hypoglycemic Event  CBG: Results for [name], [name] (MRN [id number]) as of 06/11/2019 03:18  Ref. Range 06/11/2019 03:05  Glucose-Capillary Latest Ref Range: 70 - 99 mg/dL 64 (L)    Treatment: D50 25 mL (12.5 gm)  Symptoms: None  Follow-up CBG: Time: CBG Result:  Results for [name], [name] (MRN [id number]) as of 06/11/2019 03:42  Ref. Range 06/11/2019 03:37  Glucose-Capillary Latest Ref Range: 70 - 99 mg/dL 161 (H)   Possible Reasons for Event: Inadequate meal intake  Comments/MD notified:    Viviano Simas

## 2019-06-11 NOTE — Anesthesia Procedure Notes (Signed)
Procedure Name: Intubation Performed by: Jimie Kuwahara H, CRNA Pre-anesthesia Checklist: Patient identified, Emergency Drugs available, Suction available and Patient being monitored Patient Re-evaluated:Patient Re-evaluated prior to induction Oxygen Delivery Method: Circle System Utilized Preoxygenation: Pre-oxygenation with 100% oxygen Induction Type: IV induction Ventilation: Mask ventilation without difficulty and Oral airway inserted - appropriate to patient size Laryngoscope Size: Mac and 4 Grade View: Grade I Tube type: Oral Tube size: 7.5 mm Number of attempts: 1 Airway Equipment and Method: Stylet and Oral airway Placement Confirmation: ETT inserted through vocal cords under direct vision,  positive ETCO2 and breath sounds checked- equal and bilateral Secured at: 23 cm Tube secured with: Tape Dental Injury: Teeth and Oropharynx as per pre-operative assessment        

## 2019-06-11 NOTE — Progress Notes (Signed)
Echocardiogram 2D Echocardiogram has been performed.  [name] 06/11/2019, 10:50 AM

## 2019-06-11 NOTE — Anesthesia Preprocedure Evaluation (Addendum)
Anesthesia Evaluation  Patient identified by MRN, date of birth, ID band Patient awake    Reviewed: Allergy & Precautions, NPO status , Patient's Chart, lab work & pertinent test results  History of Anesthesia Complications Negative for: history of anesthetic complications  Airway Mallampati: II  TM Distance: >3 FB Neck ROM: Full    Dental no notable dental hx.    Pulmonary former smoker,    Pulmonary exam normal        Cardiovascular hypertension, Pt. on medications Normal cardiovascular exam+ dysrhythmias Atrial Fibrillation   Carotid stenosis s/p right CEA '90s  EKG 06/07/19: Afib, RBBB   Neuro/Psych negative neurological ROS     GI/Hepatic Neg liver ROS, hiatal hernia, GERD  Medicated,Acute cholecystitis   Endo/Other  diabetes, Type 2, Insulin Dependent, Oral Hypoglycemic Agents  Renal/GU negative Renal ROS     Musculoskeletal  (+) Arthritis ,   Abdominal   Peds  Hematology  (+) anemia , Hgb 12.7   Anesthesia Other Findings Day of surgery medications reviewed with the patient.  Reproductive/Obstetrics                          Anesthesia Physical Anesthesia Plan  ASA: III  Anesthesia Plan: General   Post-op Pain Management:    Induction: Intravenous  PONV Risk Score and Plan: 3 and Treatment may vary due to age or medical condition and Ondansetron  Airway Management Planned: Oral ETT  Additional Equipment:   Intra-op Plan:   Post-operative Plan: Extubation in OR  Informed Consent: I have reviewed the patients History and Physical, chart, labs and discussed the procedure including the risks, benefits and alternatives for the proposed anesthesia with the patient or authorized representative who has indicated his/her understanding and acceptance.   Patient has DNR.  Discussed DNR with patient and Continue DNR.   Dental advisory given  Plan Discussed with:  CRNA  Anesthesia Plan Comments:       Anesthesia Quick Evaluation

## 2019-06-11 NOTE — Transfer of Care (Signed)
Immediate Anesthesia Transfer of Care Note  Patient: [name]  Procedure(s) Performed: LAPAROSCOPIC CHOLECYSTECTOMY (N/A )  Patient Location: PACU  Anesthesia Type:General  Level of Consciousness: awake  Airway & Oxygen Therapy: Patient Spontanous Breathing and Patient connected to nasal cannula oxygen  Post-op Assessment: Report given to RN and Post -op Vital signs reviewed and stable  Post vital signs: Reviewed and stable  Last Vitals:  Vitals Value Taken Time  BP 152/70 06/11/19 1326  Temp    Pulse 87 06/11/19 1327  Resp 14 06/11/19 1327  SpO2 97 % 06/11/19 1327  Vitals shown include unvalidated device data.  Last Pain:  Vitals:   06/11/19 0827  TempSrc: Oral  PainSc:       Patients Stated Pain Goal: 0 ([id number])  Complications: No apparent anesthesia complications

## 2019-06-12 ENCOUNTER — Encounter (HOSPITAL_COMMUNITY): Payer: Self-pay | Admitting: Surgery

## 2019-06-12 DIAGNOSIS — R1013 Epigastric pain: Secondary | ICD-10-CM

## 2019-06-12 LAB — GLUCOSE, CAPILLARY
Glucose-Capillary: 220 mg/dL — ABNORMAL HIGH (ref 70–99)
Glucose-Capillary: 161 mg/dL — ABNORMAL HIGH (ref 70–99)
Glucose-Capillary: 100 mg/dL — ABNORMAL HIGH (ref 70–99)
Glucose-Capillary: 47 mg/dL — ABNORMAL LOW (ref 70–99)

## 2019-06-12 LAB — CBC
HCT: 34.7 % — ABNORMAL LOW (ref 39.0–52.0)
Hemoglobin: 11.7 g/dL — ABNORMAL LOW (ref 13.0–17.0)
MCH: 31.8 pg (ref 26.0–34.0)
MCHC: 33.7 g/dL (ref 30.0–36.0)
MCV: 94.3 fL (ref 80.0–100.0)
Platelets: 295 10*3/uL (ref 150–400)
RBC: 3.68 MIL/uL — ABNORMAL LOW (ref 4.22–5.81)
RDW: 13.2 % (ref 11.5–15.5)
WBC: 16.5 10*3/uL — ABNORMAL HIGH (ref 4.0–10.5)
nRBC: 0 % (ref 0.0–0.2)

## 2019-06-12 LAB — BASIC METABOLIC PANEL
Anion gap: 12 (ref 5–15)
BUN: 8 mg/dL (ref 8–23)
CO2: 24 mmol/L (ref 22–32)
Calcium: 8.2 mg/dL — ABNORMAL LOW (ref 8.9–10.3)
Chloride: 101 mmol/L (ref 98–111)
Creatinine, Ser: 1.08 mg/dL (ref 0.61–1.24)
GFR calc Af Amer: >60 mL/min (ref >60)
GFR calc non Af Amer: >60 mL/min (ref >60)
Glucose, Bld: 183 mg/dL — ABNORMAL HIGH (ref 70–99)
Potassium: 3.5 mmol/L (ref 3.5–5.1)
Sodium: 137 mmol/L (ref 135–145)

## 2019-06-12 LAB — MAGNESIUM: Magnesium: 1.4 mg/dL — ABNORMAL LOW (ref 1.7–2.4)

## 2019-06-12 MED ORDER — DEXTROSE 50 % IV SOLN: 25.0000 g | INTRAVENOUS | Status: AC

## 2019-06-12 MED ORDER — MAGNESIUM SULFATE 4 GM/100ML IV SOLN
4.0000 g | Freq: Once | INTRAVENOUS | Status: AC
  Filled 2019-06-12: qty 100

## 2019-06-12 MED ORDER — DEXTROSE 50 % IV SOLN
INTRAVENOUS | Status: AC
  Administered 2019-06-12: 25 mL
  Filled 2019-06-12: qty 50

## 2019-06-12 NOTE — Progress Notes (Signed)
Patient ID: [name], male   DOB: 11-20-1929, 85 y.o.   MRN: [id number]  Patient doing ok s/p subtotal lap chole yesterday. Wife in room. He feels ok. Asked him again about the lesion in the pancreas as to whether he wanted to pursue further workup or possible treatment of that and he again stated that he did NOT want to pursue any type of diagnostic workup or treatment of the pancreatic lesion and knows that it appears to be a cancer. I agree with conservative management of the pancreatic lesion. Appreciate surgery's help for this very nice gentleman. F/U with GI prn. Will sign off. Call if questions.

## 2019-06-12 NOTE — Progress Notes (Signed)
Occupational Therapy Treatment Patient Details Name: [name] MRN: [id number] DOB: 1930/06/01 Today's Date: 06/12/2019    History of present illness 85 y/o male presenting with abdominal pain. Past medical history significant of HTN; HLD; and DM. Admitted for further work-up.    OT comments  Pt. Seen for skilled OT treatment session.  Able to complete bed mobility and simulated toileting tasks.  C/o pain, rn aware but agreeable to sit up in recliner.   Follow Up Recommendations  Home health OT;Supervision/Assistance - 24 hour    Equipment Recommendations  3 in 1 bedside commode;Other (comment)    Recommendations for Other Services      Precautions / Restrictions Precautions Precautions: Fall Restrictions Weight Bearing Restrictions: No       Mobility Bed Mobility Overal bed mobility: Needs Assistance Bed Mobility: Supine to Sit     Supine to sit: Mod assist     General bed mobility comments: mod to lift trunk  Transfers Overall transfer level: Needs assistance Equipment used: Rolling walker (2 wheeled) Transfers: Sit to/from [facility] Sit to Stand: Mod assist Stand pivot transfers: Mod assist            Balance                                           ADL either performed or assessed with clinical judgement   ADL Overall ADL's : Needs assistance/impaired                         Toilet Transfer: RW;Moderate assistance;Minimal assistance;BSC;Ambulation Toilet Transfer Details (indicate cue type and reason): simulated during eob to recliner transfer (pt. currently has a condom cath, does not like it. rn aware and taking it off and provided pt. with a urinal to use) Toileting- Clothing Manipulation and Hygiene: Moderate assistance;Sit to/from stand       Functional mobility during ADLs: Rolling walker;Moderate assistance;Minimal assistance;Cueing for safety General ADL Comments: pt. limited by pain but  agreeable to oob     Vision       Perception     Praxis      Cognition Arousal/Alertness: Awake/alert   Overall Cognitive Status: Within Functional Limits for tasks assessed                                 General Comments: HOH even with B hearing aides in, communication and following directions notably difficult for pt.        Exercises     Shoulder Instructions       General Comments      Pertinent Vitals/ Pain       Pain Assessment: Faces Faces Pain Scale: Hurts even more Pain Location: back, stomach Pain Descriptors / Indicators: Discomfort;Grimacing;Pressure Pain Intervention(s): Limited activity within patient's tolerance;Monitored during session;Repositioned;RN gave pain meds during session  Home Living                                          Prior Functioning/Environment              Frequency  Min 2X/week        Progress Toward Goals  OT Goals(current goals can now be  found in the care plan section)  Progress towards OT goals: Progressing toward goals     Plan Discharge plan remains appropriate    Co-evaluation                 AM-PAC OT "6 Clicks" Daily Activity     Outcome Measure   Help from another person eating meals?: None Help from another person taking care of personal grooming?: A Little Help from another person toileting, which includes using toliet, bedpan, or urinal?: A Little Help from another person bathing (including washing, rinsing, drying)?: A Little Help from another person to put on and taking off regular upper body clothing?: A Little Help from another person to put on and taking off regular lower body clothing?: A Little 6 Click Score: 19    End of Session Equipment Utilized During Treatment: Rolling walker;Gait belt  OT Visit Diagnosis: Other abnormalities of gait and mobility (R26.89);Muscle weakness (generalized) (M62.81)   Activity Tolerance Patient tolerated  treatment well   Patient Left in chair;with call bell/phone within reach   Nurse Communication Other (comment)(reviewed with rn pt. only wants to sit up in recliner 30 min. to 1 hr. Also pt. Requests drainage be emptied as he felt it was getting to full)        Time: [id number] OT Time Calculation (min): 22 min  Charges: OT General Charges $OT Visit: 1 Visit OT Treatments $Self Care/Home Management : 8-22 mins  Janice Coffin, COTA/L 06/12/2019, 12:11 PM

## 2019-06-12 NOTE — Progress Notes (Signed)
Hypoglycemic Event  CBG: Results for [name], [name] (MRN [id number]) as of 06/12/2019 20:50  Ref. Range 06/12/2019 20:42  Glucose-Capillary Latest Ref Range: 70 - 99 mg/dL 47 (L)    Treatment: D50 25 mL (12.5 gm)  Symptoms: None  Follow-up CBG: Time: CBG Result:  Results for [name], [name] (MRN [id number]) as of 06/12/2019 23:06  Ref. Range 06/12/2019 21:14  Glucose-Capillary Latest Ref Range: 70 - 99 mg/dL 100 (H)   Possible Reasons for Event: Inadequate meal intake  Comments/MD notified:    Viviano Simas

## 2019-06-12 NOTE — Plan of Care (Signed)
  Problem: Health Behavior/Discharge Planning: Goal: Ability to manage health-related needs will improve Outcome: Progressing   Problem: Coping: Goal: Level of anxiety will decrease Outcome: Progressing   Problem: Pain Managment: Goal: General experience of comfort will improve Outcome: Progressing   Problem: Safety: Goal: Ability to remain free from injury will improve Outcome: Progressing   Problem: Education: Goal: Knowledge of General Education information will improve Description: Including pain rating scale, medication(s)/side effects and non-pharmacologic comfort measures Outcome: Completed/Met   Problem: Clinical Measurements: Goal: Diagnostic test results will improve Outcome: Completed/Met Goal: Respiratory complications will improve Outcome: Completed/Met Goal: Cardiovascular complication will be avoided Outcome: Completed/Met   Problem: Elimination: Goal: Will not experience complications related to bowel motility Outcome: Completed/Met Goal: Will not experience complications related to urinary retention Outcome: Completed/Met   Problem: Skin Integrity: Goal: Risk for impaired skin integrity will decrease Outcome: Completed/Met

## 2019-06-12 NOTE — Progress Notes (Signed)
PROGRESS NOTE    [name]  [id number] DOB: 11/12/1929 DOA: 06/06/2019 PCP: [name], MD   Brief Narrative:  HPI on 06/06/2019 by Dr. Karmen Bongo Jabdiel Halgren is a 85 y.o. male with medical history significant of HTN; HLD; and DM presenting as transfer from [facility] for n/v.  He developed abdominal pain yesterday before lunch.  It worsened and he developed n/v.  Epigastric TTP.  His last emesis was last night about 11PM.  He is still having pain.  He had a normal BM yesterday AM.  He does not have chest pain, no palpitations.  Denies known h/o afib.  I spoke with his wife.  He had knee replacement in 2014.  Dr. Gwenlyn Found did a pre-op examination and he did have afib at that time.  He is taking Diltiazem.   His wife has afib and was previously on Plavix but now has a pacer.  He has never been on [facility]  His wife requests that I discuss AC with their RN daughter, Spero Wehe, teaches nursing at [employer], [phone].   He has a h/o gastritis and pancreatitis.  He did a bleeding ulcer in 1978.  The last time he fell, he hit his head hard and hard a big gash on his head.  He felt like his hip gave out, which has happened before.  She would like to discuss this further with her parents and siblings and will let us know.  Interim history Patient admitted with abdominal pain.  Gastroenterology consulted and appreciated, status post EGD showing mild gastritis.  MRCP and HIDA showed acute cholecystitis.  General surgery consulted. Cardiology consulted for clearance.  Assessment & Plan   Abdominal pain/acute cholecystitis -Gastroenterology consulted and appreciated -EGD showed mild gastritis otherwise normal EGD -CTA A/P reveals hypodense lesion in the body of pancreas -GI recommended MRCP-positive for acute cholecystitis.  No evidence of biliary ductal dilatation or choledocholithiasis.  2.3 cm complex cystic lesion in the pancreatic tail with enhancing central nodule consistent with cystic  pancreatic neoplasm -GI now recommends HIDA scan with conservative management. -HIDA scan showed nonvisualization of gallbladder despite morphine augmentation, likely related to cystic duct obstruction and changes of acute cholecystitis. -Continue PPI, pain control, antiemetics, IV fluid -General surgery consulted and appreciated has requested cardiology clearance- per cardiology: "There is currently no contraindication from cardiac standpoint for this gentleman to undergo cholecystectomy." -s/p subtotal cholecystectomy as patient had obscured landmarks, signs of gangrene  Paroxysmal atrial fibrillation -Patient states he has seen Dr. Alvester Chou in the past -Currently not on anticoagulation due to history of falls and hitting his head -Currently rate controlled on diltiazem -Cardiology would like to obtain a baseline echocardiogram and would like to start aspirin in the future if ok with gastroenterology   Acute kidney injury/dehydration -Likely due to persistent nausea and vomiting prior to admission -Improved, Creatinine currently 1.08 -Continue IV fluids  Essential hypertension -Continue Cardizem -HCTZ held  Hyperlipidemia -Continue statin  Diabetes mellitus, type II -Continue Levemir, insulin sliding scale with CBG monitoring -Metformin held  Hypokalemia/hypomagnesemia  -hypokalemia resolved with replacement -will continue to replace magnesium   DVT Prophylaxis Lovenox  Code Status: DNR  Family Communication: None at bedside  Disposition Plan: Admitted.  Pending further recommendations from surgery  Consultants Gastroenterology General surgery Cardiology  Procedures  EGD MRCP HIDA Echocardiogram  Antibiotics   Anti-infectives (From admission, onward)   Start     Dose/Rate Route Frequency Ordered Stop   06/09/19 2300  piperacillin-tazobactam (ZOSYN) IVPB 3.375 g  3.375 g 12.5 mL/hr over 240 Minutes Intravenous Every 8 hours 06/09/19 2221     06/06/19 0730   piperacillin-tazobactam (ZOSYN) IVPB 3.375 g     3.375 g 100 mL/hr over 30 Minutes Intravenous  Once 06/06/19 0715 06/06/19 [id number]      Subjective:   [name] seen and examined today.  Patient complains of abdominal soreness.  Denies current nausea or vomiting, diarrhea or constipation.  Does have some indigestion.  Denies current chest pain or shortness of breath. Objective:   Vitals:   06/11/19 1638 06/11/19 1953 06/11/19 2107 06/12/19 0519  BP: (!) 186/72 108/71 (!) 165/73 (!) 121/58  Pulse: 90 79 (!) 104 79  Resp: 18 18 19 19   Temp: 98.1 F (36.7 C) 99.7 F (37.6 C) 98.2 F (36.8 C) 98.3 F (36.8 C)  TempSrc: Oral Oral Oral Oral  SpO2: 96% 100% 96% 95%  Weight:   87.2 kg   Height:        Intake/Output Summary (Last 24 hours) at 06/12/2019 1049 Last data filed at 06/12/2019 0900 Gross per 24 hour  Intake 2067.72 ml  Output 965 ml  Net 1102.72 ml   Filed Weights   06/06/19 0025 06/10/19 2034 06/11/19 2107  Weight: 86.2 kg 86 kg 87.2 kg   Exam  General: Well developed, well nourished, NAD, appears stated age  83: NCAT, mucous membranes moist.   Cardiovascular: S1 S2 auscultated, RRR, no murmur  Respiratory: Clear to auscultation bilaterally   Abdomen: Soft, nontender, nondistended, + bowel sounds  Extremities: warm dry without cyanosis clubbing or edema  Neuro: AAOx3, nonfocal  Psych: Appropriate mood and affect, pleasant   Data Reviewed: I have personally reviewed following labs and imaging studies  CBC: Recent Labs  Lab 06/06/19 0043  06/08/19 0655 06/09/19 0814 06/10/19 0427 06/11/19 0642 06/12/19 0429  WBC 13.5*   < > 19.2* 15.4* 11.7* 11.8* 16.5*  NEUTROABS 11.8*  --   --   --   --   --   --   HGB 13.6   < > 14.2 13.0 12.0* 12.7* 11.7*  HCT 41.4   < > 42.9 39.4 36.4* 37.8* 34.7*  MCV 96.5   < > 95.3 96.1 95.3 94.0 94.3  PLT 230   < > 219 229 258 306 295   < > = values in this interval not displayed.   Basic Metabolic Panel: Recent Labs   Lab 06/08/19 0655 06/09/19 0814 06/10/19 0427 06/10/19 1322 06/11/19 0642 06/11/19 0703 06/12/19 0429  NA 132* 135 139  --  140  --  137  K 3.2* 3.1* 2.7* 2.9* 3.0*  --  3.5  CL 91* 98 100  --  102  --  101  CO2 29 27 30   --  28  --  24  GLUCOSE 160* 164* 87  --  162*  --  183*  BUN 18 17 13   --  6*  --  8  CREATININE 1.53* 1.28* 1.19  --  1.09  --  1.08  CALCIUM 8.9 8.5* 8.2*  --  8.5*  --  8.2*  MG  --   --   --  1.6*  --  1.6* 1.4*   GFR: Estimated Creatinine Clearance: 52.9 mL/min (by C-G formula based on SCr of 1.08 mg/dL). Liver Function Tests: Recent Labs  Lab 06/06/19 0108  AST 15  ALT 13  ALKPHOS 91  BILITOT 1.1  PROT 7.5  ALBUMIN 4.0   Recent Labs  Lab 06/06/19  0108  LIPASE 21   No results for input(s): AMMONIA in the last 168 hours. Coagulation Profile: No results for input(s): INR, PROTIME in the last 168 hours. Cardiac Enzymes: No results for input(s): CKTOTAL, CKMB, CKMBINDEX, TROPONINI in the last 168 hours. BNP (last 3 results) No results for input(s): PROBNP in the last 8760 hours. HbA1C: No results for input(s): HGBA1C in the last 72 hours. CBG: Recent Labs  Lab 06/11/19 1117 06/11/19 1327 06/11/19 1633 06/11/19 2045 06/12/19 0650  GLUCAP 192* 169* 163* 94 220*   Lipid Profile: No results for input(s): CHOL, HDL, LDLCALC, TRIG, CHOLHDL, LDLDIRECT in the last 72 hours. Thyroid Function Tests: Recent Labs    06/11/19 0642  TSH 2.308  FREET4 1.17*   Anemia Panel: No results for input(s): VITAMINB12, FOLATE, FERRITIN, TIBC, IRON, RETICCTPCT in the last 72 hours. Urine analysis:    Component Value Date/Time   COLORURINE YELLOW 06/06/2019 0043   APPEARANCEUR CLEAR 06/06/2019 0043   LABSPEC >1.030 (H) 06/06/2019 0043   PHURINE 6.5 06/06/2019 0043   GLUCOSEU >=500 (A) 06/06/2019 0043   HGBUR TRACE (A) 06/06/2019 0043   BILIRUBINUR NEGATIVE 06/06/2019 0043   KETONESUR 40 (A) 06/06/2019 0043   PROTEINUR >300 (A) 06/06/2019 0043    UROBILINOGEN 0.2 11/10/2012 1432   NITRITE NEGATIVE 06/06/2019 0043   LEUKOCYTESUR NEGATIVE 06/06/2019 0043   Sepsis Labs: @LABRCNTIP (procalcitonin:4,lacticidven:4)  ) Recent Results (from the past 240 hour(s))  SARS CORONAVIRUS 2 Nasal Swab Aptima Multi Swab     Status: None   Collection Time: 06/06/19  2:03 AM   Specimen: Aptima Multi Swab; Nasal Swab  Result Value Ref Range Status   SARS Coronavirus 2 NEGATIVE NEGATIVE Final    Comment: (NOTE) SARS-CoV-2 target nucleic acids are NOT DETECTED. The SARS-CoV-2 RNA is generally detectable in upper and lower respiratory specimens during the acute phase of infection. Negative results do not preclude SARS-CoV-2 infection, do not rule out co-infections with other pathogens, and should not be used as the sole basis for treatment or other patient management decisions. Negative results must be combined with clinical observations, patient history, and epidemiological information. The expected result is Negative. Fact Sheet for Patients: SugarRoll.be Fact Sheet for Healthcare Providers: https://www.woods-mathews.com/ This test is not yet approved or cleared by the [place] FDA and  has been authorized for detection and/or diagnosis of SARS-CoV-2 by FDA under an Emergency Use Authorization (EUA). This EUA will remain  in effect (meaning this test can be used) for the duration of the COVID-19 declaration under Section 56 4(b)(1) of the Act, 21 U.S.C. section 360bbb-3(b)(1), unless the authorization is terminated or revoked sooner. Performed at [facility], [place] [address], [place], [place] [address]   Surgical pcr screen     Status: None   Collection Time: 06/11/19  5:18 AM   Specimen: Nasal Mucosa; Nasal Swab  Result Value Ref Range Status   MRSA, PCR NEGATIVE NEGATIVE Final   Staphylococcus aureus NEGATIVE NEGATIVE Final    Comment: (NOTE) The Xpert SA Assay (FDA approved for  NASAL specimens in patients 29 years of age and older), is one component of a comprehensive surveillance program. It is not intended to diagnose infection nor to guide or monitor treatment. Performed at [facility], [place] [address], [place], [place] [address]       Radiology Studies: No results found.   Scheduled Meds: . atorvastatin  40 mg Oral QPM  . diltiazem  180 mg Oral Daily  . docusate  sodium  100 mg Oral BID  . enoxaparin (LOVENOX) injection  40 mg Subcutaneous Q24H  . gabapentin  100 mg Oral TID  . insulin aspart  0-5 Units Subcutaneous QHS  . insulin aspart  0-9 Units Subcutaneous TID WC  . insulin detemir  20 Units Subcutaneous q morning - 10a  . pantoprazole (PROTONIX) IV  40 mg Intravenous Q12H  . sodium chloride flush  3 mL Intravenous Q12H  . sucralfate  1 g Oral TID WC & HS  . traZODone  100 mg Oral QHS   Continuous Infusions: . dextrose 5 % and 0.9% NaCl 75 mL/hr at 06/12/19 0541  . magnesium sulfate bolus IVPB    . piperacillin-tazobactam (ZOSYN)  IV 3.375 g (06/12/19 0540)     LOS: 5 days   Time Spent in minutes   30 minutes  Alegria Dominique D.O. on 06/12/2019 at 10:49 AM  Between 7am to 7pm - Please see pager noted on [email or web address]  After 7pm go to [email or web address]  And look for the night coverage person covering for me after hours  [facility] Office  [phone]

## 2019-06-12 NOTE — Progress Notes (Signed)
Pharmacy Antibiotic Note  [name] is a 85 y.o. male admitted on 06/06/2019 by transfer from OSH ER with abdominal pain and N/V and now s/p MRCP for choledocholithiasis on 8/18. Adding antibiotic coverage for possible intra-abdominal infection.  Pharmacy has been consulted for Zosyn dosing.  WBC is elevated to 16.5 from 11.8.  Scr 1.08. Patient is afebrile.  Per surgrey, continue abx for 7 days post-op (Zosyn day 4/7 since restart on 8/19).   Plan: Continue Zosyn 3.375g IV evry 8 hours - extended infusion. Monitor renal function and clinical status  Height: 6' (182.9 cm) Weight: 192 lb 3.9 oz (87.2 kg) IBW/kg (Calculated) : 77.6  Temp (24hrs), Avg:98.5 F (36.9 C), Min:98 F (36.7 C), Max:99.7 F (37.6 C)  Recent Labs  Lab 06/08/19 0655 06/09/19 0814 06/10/19 0427 06/11/19 0642 06/12/19 0429  WBC 19.2* 15.4* 11.7* 11.8* 16.5*  CREATININE 1.53* 1.28* 1.19 1.09 1.08    Estimated Creatinine Clearance: 52.9 mL/min (by C-G formula based on SCr of 1.08 mg/dL).    No Known Allergies  Antimicrobials this admission: Zosyn 8/17 x1; 8/19 >>  Microbiology results: 8/16 COVID negative 8/21 MRSA PCR: negative   Thank you for allowing pharmacy to be a part of this patient's care.  Acey Lav, PharmD  PGY1 Acute Care Pharmacy Resident [phone] 06/12/2019 3:39 PM

## 2019-06-12 NOTE — Discharge Instructions (Signed)
LAPAROSCOPIC SURGERY: POST OP INSTRUCTIONS  ######################################################################  EAT Gradually transition to a high fiber diet with a fiber supplement over the next few weeks after discharge.  Start with a pureed / full liquid diet (see below)  WALK Walk an hour a day.  Control your pain to do that.    CONTROL PAIN Control pain so that you can walk, sleep, tolerate sneezing/coughing, go up/down stairs.  HAVE A BOWEL MOVEMENT DAILY Keep your bowels regular to avoid problems.  OK to try a laxative to override constipation.  OK to use an antidairrheal to slow down diarrhea.  Call if not better after 2 tries  CALL IF YOU HAVE PROBLEMS/CONCERNS Call if you are still struggling despite following these instructions. Call if you have concerns not answered by these instructions  ######################################################################    1. DIET: Follow a light bland diet & liquids the first 24 hours after arrival home, such as soup, liquids, starches, etc.  Be sure to drink plenty of fluids.  Quickly advance to a usual solid diet within a few days.  Avoid fast food or heavy meals as your are more likely to get nauseated or have irregular bowels.  A low-fat, high-fiber diet for the rest of your life is ideal.  2. Take your usually prescribed home medications unless otherwise directed.  3. PAIN CONTROL: a. Pain is best controlled by a usual combination of three different methods TOGETHER: i. Ice/Heat ii. Over the counter pain medication iii. Prescription pain medication b. Most patients will experience some swelling and bruising around the incisions.  Ice packs or heating pads (30-60 minutes up to 6 times a day) will help. Use ice for the first few days to help decrease swelling and bruising, then switch to heat to help relax tight/sore spots and speed recovery.  Some people prefer to use ice alone, heat alone, alternating between ice & heat.   Experiment to what works for you.  Swelling and bruising can take several weeks to resolve.   c. It is helpful to take an over-the-counter pain medication regularly for the first few weeks.  Choose one of the following that works best for you: i. Naproxen (Aleve, etc)  Two 237m tabs twice a day ii. Ibuprofen (Advil, etc) Three 2013mtabs four times a day (every meal & bedtime) iii. Acetaminophen (Tylenol, etc) 500-6508mour times a day (every meal & bedtime) d. A  prescription for pain medication (such as oxycodone, hydrocodone, tramadol, gabapentin, methocarbamol, etc) should be given to you upon discharge.  Take your pain medication as prescribed.  i. If you are having problems/concerns with the prescription medicine (does not control pain, nausea, vomiting, rash, itching, etc), please call us [phone] see if we need to switch you to a different pain medicine that will work better for you and/or control your side effect better. ii. If you need a refill on your pain medication, please give us [place] hour notice.  contact your pharmacy.  They will contact our office to request authorization. Prescriptions will not be filled after 5 pm or on week-ends  4. Avoid getting constipated.   a. Between the surgery and the pain medications, it is common to experience some constipation.   b. Increasing fluid intake and taking a fiber supplement (such as Metamucil, Citrucel, FiberCon, MiraLax, etc) 1-2 times a day regularly will usually help prevent this problem from occurring.   c. A mild laxative (prune juice, Milk of Magnesia, MiraLax, etc) should be taken according to  package directions if there are no bowel movements after 48 hours.   5. Watch out for diarrhea.   a. If you have many loose bowel movements, simplify your diet to bland foods & liquids for a few days.   b. Stop any stool softeners and decrease your fiber supplement.   c. Switching to mild anti-diarrheal medications (Kayopectate, Pepto  Bismol) can help.   d. If this worsens or does not improve, please call us.  6. Wash / shower every day.  You may shower over the dressings as they are waterproof.  Continue to shower over incision(s) after the dressing is off.  7. Remove your waterproof bandages 5 days after surgery.  You may leave the incision open to air.  You may replace a dressing/Band-Aid to cover the incision for comfort if you wish.   8. ACTIVITIES as tolerated:   a. You may resume regular (light) daily activities beginning the next day--such as daily self-care, walking, climbing stairs--gradually increasing activities as tolerated.  If you can walk 30 minutes without difficulty, it is safe to try more intense activity such as jogging, treadmill, bicycling, low-impact aerobics, swimming, etc. b. Save the most intensive and strenuous activity for last such as sit-ups, heavy lifting, contact sports, etc  Refrain from any heavy lifting or straining until you are off narcotics for pain control.   c. DO NOT PUSH THROUGH PAIN.  Let pain be your guide: If it hurts to do something, don't do it.  Pain is your body warning you to avoid that activity for another week until the pain goes down. d. You may drive when you are no longer taking prescription pain medication, you can comfortably wear a seatbelt, and you can safely maneuver your car and apply brakes. e. Dennis Bast may have sexual intercourse when it is comfortable.  9. FOLLOW UP in our office a. Please call [facility] at [phone] to set up an appointment to see your surgeon in the office for a follow-up appointment approximately 2-3 weeks after your surgery. b. Make sure that you call for this appointment the day you arrive home to insure a convenient appointment time.  10. IF YOU HAVE DISABILITY OR FAMILY LEAVE FORMS, BRING THEM TO THE OFFICE FOR PROCESSING.  DO NOT GIVE THEM TO YOUR DOCTOR.   WHEN TO CALL us [phone]: 1. Poor pain control 2. Reactions / problems with new  medications (rash/itching, nausea, etc)  3. Fever over 101.5 F (38.5 C) 4. Inability to urinate 5. Nausea and/or vomiting 6. Worsening swelling or bruising 7. Continued bleeding from incision. 8. Increased pain, redness, or drainage from the incision   The clinic staff is available to answer your questions during regular business hours (8:30am-5pm).  Please dont hesitate to call and ask to speak to one of our nurses for clinical concerns.   If you have a medical emergency, go to the nearest emergency room or call 911.  A surgeon from [facility] is always on call at the [facility], [place], [place], [place], [place]  [address] ? MAIN: [phone] ? TOLL FREE: [phone] ?  FAX [phone] [email or web address]  Please record the amount & characteristic (color) of the fluid from your drain. Please record the date and time when emptying your drain. You will need to bring this information to your follow up appointment for a drain check in ~10 days. Please call our office if the drainage becomes very dark  in color or green.

## 2019-06-12 NOTE — Progress Notes (Signed)
1 Day Post-Op  Subjective: CC: Abdominal pain Patient doing well. Some soreness around drain site and incisions. Tolerating cld without n/v. Has gotten out of bed to the chair. Wife at bedside.   Objective: Vital signs in last 24 hours: Temp:  [97.8 F (36.6 C)-99.7 F (37.6 C)] 98.3 F (36.8 C) (08/22 0519) Pulse Rate:  [77-104] 79 (08/22 0519) Resp:  [14-20] 19 (08/22 0519) BP: (108-186)/(58-77) 121/58 (08/22 0519) SpO2:  [95 %-100 %] 95 % (08/22 0519) Weight:  [87.2 kg] 87.2 kg (08/21 2107) Last BM Date: 06/10/19  Intake/Output from previous day: 08/21 0701 - 08/22 0700 In: 2067.7 [P.O.:120; I.V.:1669.5; IV Piggyback:103.2] Out: 1140 [Urine:700; Drains:390; Blood:50] Intake/Output this shift: Total I/O In: -  Out: 75 [Drains:75]  PE: Gen:  Alert, NAD, pleasant Pulm:  Normal rate and effort Abd: Soft, appropriately tender around incisions and drain site. Drain site clean with SS output. 390 recorded since surgery. Incisions with glue intact appears well and are without drainage, bleeding, or signs of infection, Skin: no rashes noted, warm and dry  Lab Results:  Recent Labs    06/11/19 0642 06/12/19 0429  WBC 11.8* 16.5*  HGB 12.7* 11.7*  HCT 37.8* 34.7*  PLT 306 295   BMET Recent Labs    06/11/19 0642 06/12/19 0429  NA 140 137  K 3.0* 3.5  CL 102 101  CO2 28 24  GLUCOSE 162* 183*  BUN 6* 8  CREATININE 1.09 1.08  CALCIUM 8.5* 8.2*   PT/INR No results for input(s): LABPROT, INR in the last 72 hours. CMP     Component Value Date/Time   NA 137 06/12/2019 0429   K 3.5 06/12/2019 0429   CL 101 06/12/2019 0429   CO2 24 06/12/2019 0429   GLUCOSE 183 (H) 06/12/2019 0429   BUN 8 06/12/2019 0429   CREATININE 1.08 06/12/2019 0429   CALCIUM 8.2 (L) 06/12/2019 0429   PROT 7.5 06/06/2019 0108   ALBUMIN 4.0 06/06/2019 0108   AST 15 06/06/2019 0108   ALT 13 06/06/2019 0108   ALKPHOS 91 06/06/2019 0108   BILITOT 1.1 06/06/2019 0108   GFRNONAA >60  06/12/2019 0429   GFRAA >60 06/12/2019 0429   Lipase     Component Value Date/Time   LIPASE 21 06/06/2019 0108       Studies/Results: No results found.  Anti-infectives: Anti-infectives (From admission, onward)   Start     Dose/Rate Route Frequency Ordered Stop   06/09/19 2300  piperacillin-tazobactam (ZOSYN) IVPB 3.375 g     3.375 g 12.5 mL/hr over 240 Minutes Intravenous Every 8 hours 06/09/19 2221     06/06/19 0730  piperacillin-tazobactam (ZOSYN) IVPB 3.375 g     3.375 g 100 mL/hr over 30 Minutes Intravenous  Once 06/06/19 0715 06/06/19 0817       Assessment/Plan Diabetes HTN HLD A. Fib.Not on anticoagulation Pancreatic tail neoplasm Code status DNR AKI - resolved Hypokalemia - K 3.5. resolved.   Acute cholecystitis - s/p Laparoscopic Subtotal Cholecystectomy with drain placement - Dr. Brantley Stage - 8/21 - Keep drain in place till follow up - Continue abx for 7 days post-op - Advance to [place] for d/c when cleared from medicine if tolerates diet   ID -zosyn 8/19>>day#4 WBC likely reactive from surgery FEN -IVF, FLD VTE -SCDs, lovenox Foley -none Follow up -Dr. Brantley Stage in 10 days.     LOS: 5 days    Jillyn Ledger , [facility] 06/12/2019, 10:51 AM  Pager: [phone]

## 2019-06-13 LAB — CBC
HCT: 34.5 % — ABNORMAL LOW (ref 39.0–52.0)
Hemoglobin: 11.4 g/dL — ABNORMAL LOW (ref 13.0–17.0)
MCH: 31.5 pg (ref 26.0–34.0)
MCHC: 33 g/dL (ref 30.0–36.0)
MCV: 95.3 fL (ref 80.0–100.0)
Platelets: 309 10*3/uL (ref 150–400)
RBC: 3.62 MIL/uL — ABNORMAL LOW (ref 4.22–5.81)
RDW: 13.5 % (ref 11.5–15.5)
WBC: 13.8 10*3/uL — ABNORMAL HIGH (ref 4.0–10.5)
nRBC: 0 % (ref 0.0–0.2)

## 2019-06-13 LAB — BASIC METABOLIC PANEL
Anion gap: 8 (ref 5–15)
BUN: 6 mg/dL — ABNORMAL LOW (ref 8–23)
CO2: 26 mmol/L (ref 22–32)
Calcium: 7.9 mg/dL — ABNORMAL LOW (ref 8.9–10.3)
Chloride: 104 mmol/L (ref 98–111)
Creatinine, Ser: 1 mg/dL (ref 0.61–1.24)
GFR calc Af Amer: >60 mL/min (ref >60)
GFR calc non Af Amer: >60 mL/min (ref >60)
Glucose, Bld: 196 mg/dL — ABNORMAL HIGH (ref 70–99)
Potassium: 2.9 mmol/L — ABNORMAL LOW (ref 3.5–5.1)
Sodium: 138 mmol/L (ref 135–145)

## 2019-06-13 LAB — GLUCOSE, CAPILLARY
Glucose-Capillary: 161 mg/dL — ABNORMAL HIGH (ref 70–99)
Glucose-Capillary: 193 mg/dL — ABNORMAL HIGH (ref 70–99)
Glucose-Capillary: 149 mg/dL — ABNORMAL HIGH (ref 70–99)
Glucose-Capillary: 92 mg/dL (ref 70–99)

## 2019-06-13 LAB — MAGNESIUM: Magnesium: 2.2 mg/dL (ref 1.7–2.4)

## 2019-06-13 MED ORDER — POTASSIUM CHLORIDE CRYS ER 20 MEQ PO TBCR
40.0000 meq | EXTENDED_RELEASE_TABLET | ORAL | Status: AC
  Administered 2019-06-13 (×2): 40 meq via ORAL
  Filled 2019-06-13 (×2): qty 2

## 2019-06-13 NOTE — Plan of Care (Signed)
  Problem: Clinical Measurements: Goal: Ability to maintain clinical measurements within normal limits will improve Outcome: Progressing Goal: Will remain free from infection Outcome: Progressing   Problem: Activity: Goal: Risk for activity intolerance will decrease Outcome: Progressing   Problem: Pain Managment: Goal: General experience of comfort will improve Outcome: Progressing   

## 2019-06-13 NOTE — Progress Notes (Signed)
Got patient out of bed and walked into hallway turned around and got him back in chair.

## 2019-06-13 NOTE — Plan of Care (Signed)
  Problem: Clinical Measurements: Goal: Ability to maintain clinical measurements within normal limits will improve Outcome: Progressing Goal: Will remain free from infection Outcome: Progressing   Problem: Activity: Goal: Risk for activity intolerance will decrease Outcome: Progressing   Problem: Nutrition: Goal: Adequate nutrition will be maintained Outcome: Progressing   Problem: Pain Managment: Goal: General experience of comfort will improve Outcome: Completed/Met   Problem: Activity: Goal: Ability to tolerate increased activity will improve Outcome: Completed/Met   Problem: Cardiac: Goal: Ability to achieve and maintain adequate cardiopulmonary perfusion will improve Outcome: Completed/Met

## 2019-06-13 NOTE — Progress Notes (Signed)
Progress Note: General Surgery Service   Assessment/Plan: Principal Problem:   Abdominal pain Active Problems:   Essential hypertension   Hyperlipidemia   Diabetes (HCC)   AF (paroxysmal atrial fibrillation) (HCC)   Dehydration, mild   Pancreatic lesion   Gastritis   Acute kidney injury ([place])  s/p Procedure(s): LAPAROSCOPIC CHOLECYSTECTOMY 06/11/2019  Acute cholecystitis - s/p Laparoscopic Subtotal Cholecystectomy with drain placement - Dr. Brantley Stage - 8/21 -Keep drain in place till follow up - Continue abx for 7 days post-op - continue current diet - continue to monitor for drain output, current serosanguinous  ID -zosyn 8/19>>day#4 WBC likely reactive from surgery FEN -IVF,FLD VTE -SCDs, lovenox Foley -none Follow up -Dr. Brantley Stage in 10 days.     LOS: 6 days  Chief Complaint/Subjective: Pain moderate, no Bowel movement, tolerating diet  Objective: Vital signs in last 24 hours: Temp:  [97.5 F (36.4 C)-98.5 F (36.9 C)] 98.5 F (36.9 C) (08/23 0914) Pulse Rate:  [55-91] 84 (08/23 0914) Resp:  [18] 18 (08/23 0914) BP: (135-175)/(55-80) 175/80 (08/23 0914) SpO2:  [92 %-100 %] 95 % (08/23 0914) Weight:  [87.5 kg] 87.5 kg (08/22 2044) Last BM Date: 06/10/19  Intake/Output from previous day: 08/22 0701 - 08/23 0700 In: 2199.6 [P.O.:600; I.V.:1449.6; IV Piggyback:150] Out: 675 [Urine:300; Drains:375] Intake/Output this shift: No intake/output data recorded.  Lungs: nonlabored  Cardiovascular: RRR  Abd: soft, ATTP, incisions c/d/i, drain serosanguinous  Extremities: no edema  Neuro: AOx4  Lab Results: CBC  Recent Labs    06/12/19 0429 06/13/19 0550  WBC 16.5* 13.8*  HGB 11.7* 11.4*  HCT 34.7* 34.5*  PLT 295 309   BMET Recent Labs    06/12/19 0429 06/13/19 0550  NA 137 138  K 3.5 2.9*  CL 101 104  CO2 24 26  GLUCOSE 183* 196*  BUN 8 6*  CREATININE 1.08 1.00  CALCIUM 8.2* 7.9*   PT/INR No results for input(s): LABPROT, INR in  the last 72 hours. ABG No results for input(s): PHART, HCO3 in the last 72 hours.  Invalid input(s): PCO2, PO2  Studies/Results:  Anti-infectives: Anti-infectives (From admission, onward)   Start     Dose/Rate Route Frequency Ordered Stop   06/09/19 2300  piperacillin-tazobactam (ZOSYN) IVPB 3.375 g     3.375 g 12.5 mL/hr over 240 Minutes Intravenous Every 8 hours 06/09/19 2221     06/06/19 0730  piperacillin-tazobactam (ZOSYN) IVPB 3.375 g     3.375 g 100 mL/hr over 30 Minutes Intravenous  Once 06/06/19 0715 06/06/19 0817      Medications: Scheduled Meds: . atorvastatin  40 mg Oral QPM  . dextrose  25 g Intravenous STAT  . diltiazem  180 mg Oral Daily  . docusate sodium  100 mg Oral BID  . enoxaparin (LOVENOX) injection  40 mg Subcutaneous Q24H  . gabapentin  100 mg Oral TID  . insulin aspart  0-5 Units Subcutaneous QHS  . insulin aspart  0-9 Units Subcutaneous TID WC  . insulin detemir  20 Units Subcutaneous q morning - 10a  . pantoprazole (PROTONIX) IV  40 mg Intravenous Q12H  . sodium chloride flush  3 mL Intravenous Q12H  . sucralfate  1 g Oral TID WC & HS  . traZODone  100 mg Oral QHS   Continuous Infusions: . dextrose 5 % and 0.9% NaCl 75 mL/hr at 06/12/19 2126  . piperacillin-tazobactam (ZOSYN)  IV 3.375 g (06/13/19 0520)   PRN Meds:.acetaminophen **OR** acetaminophen, fentaNYL (SUBLIMAZE) injection, ondansetron **OR** ondansetron (ZOFRAN)  IV  Mickeal Skinner, MD [facility]

## 2019-06-13 NOTE — Progress Notes (Signed)
PROGRESS NOTE    [name]  [id number] DOB: [birth year] DOA: 06/06/2019 PCP: [name], MD   Brief Narrative:  HPI on 06/06/2019 by Dr. Karmen Bongo Tanav [name] is a 85 y.o. male with medical history significant of HTN; HLD; and DM presenting as transfer from [facility] for n/v.  He developed abdominal pain yesterday before lunch.  It worsened and he developed n/v.  Epigastric TTP.  His last emesis was last night about 11PM.  He is still having pain.  He had a normal BM yesterday AM.  He does not have chest pain, no palpitations.  Denies known h/o afib.  I spoke with his wife.  He had knee replacement in 2014.  Dr. Gwenlyn Found did a pre-op examination and he did have afib at that time.  He is taking Diltiazem.   His wife has afib and was previously on Plavix but now has a pacer.  He has never been on [facility].  His wife requests that I discuss AC with their RN daughter, Nikolas Bulow, teaches nursing at [employer], [phone].   He has a h/o gastritis and pancreatitis.  He did a bleeding ulcer in 1978.  The last time he fell, he hit his head hard and hard a big gash on his head.  He felt like his hip gave out, which has happened before.  She would like to discuss this further with her parents and siblings and will let us know.  Interim history Patient admitted with abdominal pain.  Gastroenterology consulted and appreciated, status post EGD showing mild gastritis.  MRCP and HIDA showed acute cholecystitis.  General surgery consulted. Cardiology consulted for clearance.  Assessment & Plan   Abdominal pain/acute cholecystitis -Gastroenterology consulted and appreciated -EGD showed mild gastritis otherwise normal EGD -CTA A/P reveals hypodense lesion in the body of pancreas -GI recommended MRCP-positive for acute cholecystitis.  No evidence of biliary ductal dilatation or choledocholithiasis.  2.3 cm complex cystic lesion in the pancreatic tail with enhancing central nodule consistent with cystic  pancreatic neoplasm -GI now recommends HIDA scan with conservative management. -HIDA scan showed nonvisualization of gallbladder despite morphine augmentation, likely related to cystic duct obstruction and changes of acute cholecystitis. -Continue PPI, pain control, antiemetics, IV fluid -General surgery consulted and appreciated has requested cardiology clearance- per cardiology: "There is currently no contraindication from cardiac standpoint for this gentleman to undergo cholecystectomy." -s/p subtotal cholecystectomy as patient had obscured landmarks, signs of gangrene -General surgery recommending 7 days of antibiotics postop.  Keep drain in place. -Currently on diet and tolerating  Paroxysmal atrial fibrillation -Patient states he has seen Dr. Alvester Chou in the past -Currently not on anticoagulation due to history of falls and hitting his head -Currently rate controlled on diltiazem -Cardiology would like to obtain a baseline echocardiogram and would like to start aspirin in the future if ok with gastroenterology   Acute kidney injury/dehydration -Likely due to persistent nausea and vomiting prior to admission -Improved, Creatinine currently 1.00 -was on IV fluids  Essential hypertension -Continue Cardizem -HCTZ held  Hyperlipidemia -Continue statin  Diabetes mellitus, type II -Continue Levemir, insulin sliding scale with CBG monitoring -Metformin held  Hypokalemia/hypomagnesemia  -continue to replace potassium and monitor  -magnesium now 2.2 after replacement  DVT Prophylaxis Lovenox  Code Status: DNR  Family Communication: None at bedside  Disposition Plan: Admitted.  Pending further recommendations from surgery  Consultants Gastroenterology General surgery Cardiology  Procedures  EGD MRCP HIDA Echocardiogram  Antibiotics   Anti-infectives (From admission, onward)  Start     Dose/Rate Route Frequency Ordered Stop   06/09/19 2300  piperacillin-tazobactam  (ZOSYN) IVPB 3.375 g     3.375 g 12.5 mL/hr over 240 Minutes Intravenous Every 8 hours 06/09/19 2221     06/06/19 0730  piperacillin-tazobactam (ZOSYN) IVPB 3.375 g     3.375 g 100 mL/hr over 30 Minutes Intravenous  Once 06/06/19 0715 06/06/19 [id number]      Subjective:   [name] seen and examined today.  Patient with mild abdominal soreness.  Denies current nausea or vomiting, chest pain, shortness of breath, dizziness or headache. Objective:   Vitals:   06/12/19 0910 06/12/19 2044 06/13/19 0526 06/13/19 0914  BP: 118/60 138/80 (!) 135/55 (!) 175/80  Pulse: 82 91 (!) 55 84  Resp: 18 18 18 18   Temp: 98 F (36.7 C) (!) 97.5 F (36.4 C) 97.7 F (36.5 C) 98.5 F (36.9 C)  TempSrc: Oral Oral  Oral  SpO2: 96% 100% 92% 95%  Weight:  87.5 kg    Height:        Intake/Output Summary (Last 24 hours) at 06/13/2019 1150 Last data filed at 06/13/2019 1000 Gross per 24 hour  Intake 1959.61 ml  Output 650 ml  Net 1309.61 ml   Filed Weights   06/10/19 2034 06/11/19 2107 06/12/19 2044  Weight: 86 kg 87.2 kg 87.5 kg   Exam  General: Well developed, well nourished, NAD, appears stated age  25: NCAT, mucous membranes moist.   Cardiovascular: S1 S2 auscultated, RRR, no murmur  Respiratory: Clear to auscultation bilaterally with equal chest rise  Abdomen: Soft, mildly TTP, nondistended, + bowel sounds, +drain with serosanguineous drainage  Extremities: warm dry without cyanosis clubbing or edema  Neuro: AAOx3, nonfocal  Psych: Pleasant, appropriate mood and affect  Data Reviewed: I have personally reviewed following labs and imaging studies  CBC: Recent Labs  Lab 06/09/19 0814 06/10/19 0427 06/11/19 0642 06/12/19 0429 06/13/19 0550  WBC 15.4* 11.7* 11.8* 16.5* 13.8*  HGB 13.0 12.0* 12.7* 11.7* 11.4*  HCT 39.4 36.4* 37.8* 34.7* 34.5*  MCV 96.1 95.3 94.0 94.3 95.3  PLT 229 258 306 295 [id number]   Basic Metabolic Panel: Recent Labs  Lab 06/09/19 0814 06/10/19 0427  06/10/19 1322 06/11/19 0642 06/11/19 0703 06/12/19 0429 06/13/19 0550  NA 135 139  --  140  --  137 138  K 3.1* 2.7* 2.9* 3.0*  --  3.5 2.9*  CL 98 100  --  102  --  101 104  CO2 27 30  --  28  --  24 26  GLUCOSE 164* 87  --  162*  --  183* 196*  BUN 17 13  --  6*  --  8 6*  CREATININE 1.28* 1.19  --  1.09  --  1.08 1.00  CALCIUM 8.5* 8.2*  --  8.5*  --  8.2* 7.9*  MG  --   --  1.6*  --  1.6* 1.4* 2.2   GFR: Estimated Creatinine Clearance: 57.1 mL/min (by C-G formula based on SCr of 1 mg/dL). Liver Function Tests: No results for input(s): AST, ALT, ALKPHOS, BILITOT, PROT, ALBUMIN in the last 168 hours. No results for input(s): LIPASE, AMYLASE in the last 168 hours. No results for input(s): AMMONIA in the last 168 hours. Coagulation Profile: No results for input(s): INR, PROTIME in the last 168 hours. Cardiac Enzymes: No results for input(s): CKTOTAL, CKMB, CKMBINDEX, TROPONINI in the last 168 hours. BNP (last 3 results) No results for  input(s): PROBNP in the last 8760 hours. HbA1C: No results for input(s): HGBA1C in the last 72 hours. CBG: Recent Labs  Lab 06/12/19 0650 06/12/19 1326 06/12/19 2042 06/12/19 2114 06/13/19 0655  GLUCAP 220* 161* 47* 100* 161*   Lipid Profile: No results for input(s): CHOL, HDL, LDLCALC, TRIG, CHOLHDL, LDLDIRECT in the last 72 hours. Thyroid Function Tests: Recent Labs    06/11/19 0642  TSH 2.308  FREET4 1.17*   Anemia Panel: No results for input(s): VITAMINB12, FOLATE, FERRITIN, TIBC, IRON, RETICCTPCT in the last 72 hours. Urine analysis:    Component Value Date/Time   COLORURINE YELLOW 06/06/2019 0043   APPEARANCEUR CLEAR 06/06/2019 0043   LABSPEC >1.030 (H) 06/06/2019 0043   PHURINE 6.5 06/06/2019 0043   GLUCOSEU >=500 (A) 06/06/2019 0043   HGBUR TRACE (A) 06/06/2019 0043   BILIRUBINUR NEGATIVE 06/06/2019 0043   KETONESUR 40 (A) 06/06/2019 0043   PROTEINUR >300 (A) 06/06/2019 0043   UROBILINOGEN 0.2 11/10/2012 1432    NITRITE NEGATIVE 06/06/2019 0043   LEUKOCYTESUR NEGATIVE 06/06/2019 0043   Sepsis Labs: @LABRCNTIP (procalcitonin:4,lacticidven:4)  ) Recent Results (from the past 240 hour(s))  SARS CORONAVIRUS 2 Nasal Swab Aptima Multi Swab     Status: None   Collection Time: 06/06/19  2:03 AM   Specimen: Aptima Multi Swab; Nasal Swab  Result Value Ref Range Status   SARS Coronavirus 2 NEGATIVE NEGATIVE Final    Comment: (NOTE) SARS-CoV-2 target nucleic acids are NOT DETECTED. The SARS-CoV-2 RNA is generally detectable in upper and lower respiratory specimens during the acute phase of infection. Negative results do not preclude SARS-CoV-2 infection, do not rule out co-infections with other pathogens, and should not be used as the sole basis for treatment or other patient management decisions. Negative results must be combined with clinical observations, patient history, and epidemiological information. The expected result is Negative. Fact Sheet for Patients: SugarRoll.be Fact Sheet for Healthcare Providers: https://www.woods-mathews.com/ This test is not yet approved or cleared by the [place] FDA and  has been authorized for detection and/or diagnosis of SARS-CoV-2 by FDA under an Emergency Use Authorization (EUA). This EUA will remain  in effect (meaning this test can be used) for the duration of the COVID-19 declaration under Section 56 4(b)(1) of the Act, 21 U.S.C. section 360bbb-3(b)(1), unless the authorization is terminated or revoked sooner. Performed at [facility], [place] [address], [place], [place] [address]   Surgical pcr screen     Status: None   Collection Time: 06/11/19  5:18 AM   Specimen: Nasal Mucosa; Nasal Swab  Result Value Ref Range Status   MRSA, PCR NEGATIVE NEGATIVE Final   Staphylococcus aureus NEGATIVE NEGATIVE Final    Comment: (NOTE) The Xpert SA Assay (FDA approved for NASAL specimens in patients 55 years of  age and older), is one component of a comprehensive surveillance program. It is not intended to diagnose infection nor to guide or monitor treatment. Performed at [facility], [place] [address] [place], [place] [address]       Radiology Studies: No results found.   Scheduled Meds: . atorvastatin  40 mg Oral QPM  . dextrose  25 g Intravenous STAT  . diltiazem  180 mg Oral Daily  . docusate sodium  100 mg Oral BID  . enoxaparin (LOVENOX) injection  40 mg Subcutaneous Q24H  . gabapentin  100 mg Oral TID  . insulin aspart  0-5 Units Subcutaneous QHS  . insulin aspart  0-9 Units Subcutaneous TID WC  .  insulin detemir  20 Units Subcutaneous q morning - 10a  . pantoprazole (PROTONIX) IV  40 mg Intravenous Q12H  . sodium chloride flush  3 mL Intravenous Q12H  . sucralfate  1 g Oral TID WC & HS  . traZODone  100 mg Oral QHS   Continuous Infusions: . dextrose 5 % and 0.9% NaCl 75 mL/hr at 06/12/19 2126  . piperacillin-tazobactam (ZOSYN)  IV 3.375 g (06/13/19 0520)     LOS: 6 days   Time Spent in minutes   30 minutes  Gessica Jawad D.O. on 06/13/2019 at 11:50 AM  Between 7am to 7pm - Please see pager noted on [email or web address]  After 7pm go to [email or web address]  And look for the night coverage person covering for me after hours  [facility] Office  [phone]

## 2019-06-14 DIAGNOSIS — K810 Acute cholecystitis: Secondary | ICD-10-CM

## 2019-06-14 LAB — BASIC METABOLIC PANEL
Anion gap: 8 (ref 5–15)
BUN: 10 mg/dL (ref 8–23)
CO2: 27 mmol/L (ref 22–32)
Calcium: 8.1 mg/dL — ABNORMAL LOW (ref 8.9–10.3)
Chloride: 105 mmol/L (ref 98–111)
Creatinine, Ser: 1.47 mg/dL — ABNORMAL HIGH (ref 0.61–1.24)
GFR calc Af Amer: 49 mL/min — ABNORMAL LOW (ref >60)
GFR calc non Af Amer: 42 mL/min — ABNORMAL LOW (ref >60)
Glucose, Bld: 118 mg/dL — ABNORMAL HIGH (ref 70–99)
Potassium: 3.5 mmol/L (ref 3.5–5.1)
Sodium: 140 mmol/L (ref 135–145)

## 2019-06-14 LAB — CBC
HCT: 33.4 % — ABNORMAL LOW (ref 39.0–52.0)
Hemoglobin: 11.4 g/dL — ABNORMAL LOW (ref 13.0–17.0)
MCH: 31.9 pg (ref 26.0–34.0)
MCHC: 34.1 g/dL (ref 30.0–36.0)
MCV: 93.6 fL (ref 80.0–100.0)
Platelets: 355 10*3/uL (ref 150–400)
RBC: 3.57 MIL/uL — ABNORMAL LOW (ref 4.22–5.81)
RDW: 13.2 % (ref 11.5–15.5)
WBC: 11.9 10*3/uL — ABNORMAL HIGH (ref 4.0–10.5)
nRBC: 0 % (ref 0.0–0.2)

## 2019-06-14 LAB — GLUCOSE, CAPILLARY
Glucose-Capillary: 108 mg/dL — ABNORMAL HIGH (ref 70–99)
Glucose-Capillary: 240 mg/dL — ABNORMAL HIGH (ref 70–99)
Glucose-Capillary: 145 mg/dL — ABNORMAL HIGH (ref 70–99)
Glucose-Capillary: 176 mg/dL — ABNORMAL HIGH (ref 70–99)

## 2019-06-14 MED ORDER — POTASSIUM CHLORIDE CRYS ER 20 MEQ PO TBCR
40.0000 meq | EXTENDED_RELEASE_TABLET | Freq: Once | ORAL | Status: AC
  Administered 2019-06-14: 40 meq via ORAL
  Filled 2019-06-14: qty 2

## 2019-06-14 MED ORDER — SODIUM CHLORIDE 0.9 % IV SOLN
INTRAVENOUS | Status: DC
  Administered 2019-06-14 (×2): via INTRAVENOUS

## 2019-06-14 NOTE — TOC Initial Note (Signed)
Transition of Care [facility]) - Initial/Assessment Note    Patient Details  Name: [name] MRN: [id number] Date of Birth: 1930/03/21  Transition of Care [facility]) CM/SW Contact:    Robert Crews, RN Phone Number: [phone] 06/14/2019, 4:48 PM  Clinical Narrative:                 Spoke with patient at bedside. PTA home with spouse. Was attending outpatient PT at [facility] PCP: Dr. Olen Pel, but also has PCP: Dr. Carie Caddy at [facility]. Discussed recommendations for [facility] PT, patient is agreeable. Offered choice for [facility] agency, patient called his wife, [name], who advised that she had used [facility] 3 years ago. Spoke with daughter, [name], who is a Marine scientist in [place]. She is visiting and is pleased that patient will be receiving [place] services after transition home. CM contact information provided to spouse for follow up calls if needed.  Referral placed to [facility] for RN, PT, OT, and SW - pending acceptance - Will need home health orders for RN, PT, OT, and SW with Face to Face. For DME, patient has RW and cane, but does not have beside commode as recommended by OT. Referral placed to [facility]. Will need DME order for 3N1. No concerns about management of medications which he gets from the [place]. CM to follow for transition of care needs.   Expected Discharge Plan: [place] Barriers to Discharge: Continued Medical Work up   Patient Goals and CMS Choice Patient states their goals for this hospitalization and ongoing recovery are:: return home CMS Medicare.gov Compare Post Acute Care list provided to:: Patient Choice offered to / list presented to : Patient, Spouse  Expected Discharge Plan and Services Expected Discharge Plan: [place]   Discharge Planning Services: CM Consult Post Acute Care Choice: Home Health, Durable Medical Equipment Living arrangements for the past 2 months: [place]                 DME Arranged: 3-N-1 DME  Agency: [facility] Date DME Agency Contacted: 06/14/19 Time DME Agency Contacted: [phone] Representative spoke with at DME Agency: [place]: RN, PT, OT, Social Work [employer] Agency: [place]) Date [place]: 06/14/19 Time [place]: 1647 Representative spoke with at [place]: [place] - Pending acceptance  Prior Living Arrangements/Services Living arrangements for the past 2 months: [place] with:: Self, Spouse Patient language and need for interpreter reviewed:: Yes Do you feel safe going back to the place where you live?: Yes      Need for Family Participation in Patient Care: Yes (Comment) Care giver support system in place?: Yes (comment) Current home services: DME, Other (comment)(outpatient PT at [facility]) Criminal Activity/Legal Involvement Pertinent to Current Situation/Hospitalization: No - Comment as needed  Activities of Daily Living Home Assistive Devices/Equipment: Environmental consultant (specify type), Hearing aid ADL Screening (condition at time of admission) Patient's cognitive ability adequate to safely complete daily activities?: Yes Is the patient deaf or have difficulty hearing?: Yes Does the patient have difficulty seeing, even when wearing glasses/contacts?: No Does the patient have difficulty concentrating, remembering, or making decisions?: No Patient able to express need for assistance with ADLs?: Yes Does the patient have difficulty dressing or bathing?: No Independently performs ADLs?: Yes (appropriate for developmental age) Does the patient have difficulty walking or climbing stairs?: Yes Weakness of Legs: Both Weakness of Arms/Hands: None  Permission Sought/Granted Permission sought to  share information with : Family Supports Permission granted to share information with : Yes, Verbal Permission Granted  Share Information with NAME: [name]     Permission granted to share info w Relationship:  spouse  Permission granted to share info w Contact Information: [phone]  Emotional Assessment Appearance:: Appears stated age Attitude/Demeanor/Rapport: Engaged Affect (typically observed): Accepting Orientation: : Oriented to Self, Oriented to Place, Oriented to  Time, Oriented to Situation Alcohol / Substance Use: Not Applicable Psych Involvement: No (comment)  Admission diagnosis:  Paroxysmal atrial fibrillation ([place]) [I48.0] RUQ pain [R10.11] New onset atrial fibrillation (H[facility]) [I48.91] Pancreatic mass [K86.89] Calculus of gallbladder without cholecystitis without obstruction [K80.20] Upper abdominal pain [R10.10] Nausea and vomiting in adult [R11.2] Patient Active Problem List   Diagnosis Date Noted  . Gastritis 06/08/2019  . Acute kidney injury ([place]) 06/08/2019  . Pancreatic lesion 06/07/2019  . Abdominal pain 06/06/2019  . AF (paroxysmal atrial fibrillation) ([place]) 06/06/2019  . Dehydration, mild 06/06/2019  . Essential hypertension 11/05/2013  . Hyperlipidemia 11/05/2013  . Diabetes ([place]) 11/05/2013  . Aftercare following surgery of the circulatory system, [place] 06/30/2013  . Primary osteoarthritis of right knee 11/18/2012  . Occlusion and stenosis of carotid artery without mention of cerebral infarction 06/30/2012   PCP:  [name], MD Pharmacy:   [id number] [place], [place] [address] Phone: [phone] Fax: [phone]     Social Determinants of Health (SDOH) Interventions    Readmission Risk Interventions No flowsheet data found.

## 2019-06-14 NOTE — Progress Notes (Signed)
Physical Therapy Treatment Patient Details Name: [name] MRN: [id number] DOB: [birth year] Today's Date: 06/14/2019    History of Present Illness 85 y/o male presenting with abdominal pain. Past medical history significant of HTN; HLD; and DM. Admitted for further work-up.     PT Comments    Pt was tired from just walking with staff in the hall but agreed to LE strengthening in the chair.  Pt is moving along with all mobility, and was safely able to do the exercises with cues from PT.  Follow along acute therapy to make progress with all needs.  Pt is making progress with all these goals in acute plan.  Follow Up Recommendations  Home health PT;Supervision/Assistance - 24 hour     Equipment Recommendations  None recommended by PT    Recommendations for Other Services       Precautions / Restrictions Precautions Precautions: Fall Restrictions Weight Bearing Restrictions: No    Mobility  Bed Mobility               General bed mobility comments: up in chair when PT arrived  Transfers Overall transfer level: Needs assistance               General transfer comment: up in chair when PT arrived  Ambulation/Gait             General Gait Details: declined   Stairs             Wheelchair Mobility    Modified Rankin (Stroke Patients Only)       Balance                                            Cognition Arousal/Alertness: Awake/alert Behavior During Therapy: WFL for tasks assessed/performed Overall Cognitive Status: Within Functional Limits for tasks assessed                                 General Comments: [facility]      Exercises General Exercises - Lower Extremity Ankle Circles/Pumps: AROM;Both;5 reps Quad Sets: AROM;Both;10 reps Gluteal Sets: AROM;Both;10 reps Long Arc Quad: Strengthening;Both;10 reps Heel Slides: Strengthening;Both Straight Leg Raises: Strengthening;Both;10 reps Hip Flexion/Marching:  Strengthening;Both;10 reps    General Comments        Pertinent Vitals/Pain Pain Assessment: No/denies pain    Home Living                      Prior Function            PT Goals (current goals can now be found in the care plan section) Acute Rehab PT Goals Patient Stated Goal: return home soon Progress towards PT goals: Progressing toward goals    Frequency    Min 3X/week      PT Plan Current plan remains appropriate    Co-evaluation              AM-PAC PT "6 Clicks" Mobility   Outcome Measure  Help needed turning from your back to your side while in a flat bed without using bedrails?: None Help needed moving from lying on your back to sitting on the side of a flat bed without using bedrails?: A Little Help needed moving to and from a bed to a chair (including a wheelchair)?: A Little Help needed  standing up from a chair using your arms (e.g., wheelchair or bedside chair)?: A Little Help needed to walk in hospital room?: A Little Help needed climbing 3-5 steps with a railing? : Total 6 Click Score: 17    End of Session   Activity Tolerance: Patient tolerated treatment well Patient left: in chair;with call bell/phone within reach Nurse Communication: Mobility status PT Visit Diagnosis: Unsteadiness on feet (R26.81);Other abnormalities of gait and mobility (R26.89);Muscle weakness (generalized) (M62.81);History of falling (Z91.81)     Time: [id number] PT Time Calculation (min) (ACUTE ONLY): 22 min  Charges:  $Therapeutic Exercise: 8-22 mins                  [name] 06/14/2019, 6:43 PM   [name], PT MS Acute Rehab Dept. Number: [place]

## 2019-06-14 NOTE — Plan of Care (Signed)
  Problem: Clinical Measurements: Goal: Ability to maintain clinical measurements within normal limits will improve Outcome: Progressing   

## 2019-06-14 NOTE — Progress Notes (Signed)
PROGRESS NOTE    [name]  [id number] DOB: Mar 11, 1930 DOA: 06/06/2019 PCP: [name], MD   Brief Narrative:  HPI on 06/06/2019 by Dr. Karmen Bongo [name] is a 85 y.o. male with medical history significant of HTN; HLD; and DM presenting as transfer from [facility] for n/v.  He developed abdominal pain yesterday before lunch.  It worsened and he developed n/v.  Epigastric TTP.  His last emesis was last night about 11PM.  He is still having pain.  He had a normal BM yesterday AM.  He does not have chest pain, no palpitations.  Denies known h/o afib.  I spoke with his wife.  He had knee replacement in 2014.  Dr. Gwenlyn Found did a pre-op examination and he did have afib at that time.  He is taking Diltiazem.   His wife has afib and was previously on Plavix but now has a pacer.  He has never been on [facility].  His wife requests that I discuss AC with their RN daughter, [name], teaches nursing at [employer], [phone].   He has a h/o gastritis and pancreatitis.  He did a bleeding ulcer in 1978.  The last time he fell, he hit his head hard and hard a big gash on his head.  He felt like his hip gave out, which has happened before.  She would like to discuss this further with her parents and siblings and will let us know.  Interim history Patient admitted with abdominal pain.  Gastroenterology consulted and appreciated, status post EGD showing mild gastritis.  MRCP and HIDA showed acute cholecystitis.  General surgery consulted. Cardiology consulted for clearance.  Assessment & Plan   Abdominal pain/acute cholecystitis -Gastroenterology consulted and appreciated -EGD showed mild gastritis otherwise normal EGD -CTA A/P reveals hypodense lesion in the body of pancreas -GI recommended MRCP-positive for acute cholecystitis.  No evidence of biliary ductal dilatation or choledocholithiasis.  2.3 cm complex cystic lesion in the pancreatic tail with enhancing central nodule consistent with cystic  pancreatic neoplasm -GI now recommends HIDA scan with conservative management. -HIDA scan showed nonvisualization of gallbladder despite morphine augmentation, likely related to cystic duct obstruction and changes of acute cholecystitis. -Continue PPI, pain control, antiemetics, IV fluid -General surgery consulted and appreciated has requested cardiology clearance- per cardiology: "There is currently no contraindication from cardiac standpoint for this gentleman to undergo cholecystectomy." -s/p subtotal cholecystectomy as patient had obscured landmarks, signs of gangrene -General surgery recommending 7 days of antibiotics postop.  Keep drain in place. -Currently on diet and tolerating -General surgery feels patent can be discharged with outpatient follow up  Paroxysmal atrial fibrillation -Patient states he has seen Dr. Alvester Chou in the past -Currently not on anticoagulation due to history of falls and hitting his head -Currently rate controlled on diltiazem -Cardiology would like to obtain a baseline echocardiogram and would like to start aspirin in the future if ok with gastroenterology  -recommended holding aspirin until drain is removed  Acute kidney injury/dehydration -Likely due to persistent nausea and vomiting prior to admission -creatinine 1.47 -placed back on IVF  -Continue to monitor BMP  Essential hypertension -Continue Cardizem -HCTZ held  Hyperlipidemia -Continue statin  Diabetes mellitus, type II -Continue Levemir, insulin sliding scale with CBG monitoring -Metformin held  Hypokalemia/hypomagnesemia  -continue to replace potassium and monitor  -magnesium now 2.2 after replacement  DVT Prophylaxis Lovenox  Code Status: DNR  Family Communication: None at bedside. Discussed with daughter via phone.  Disposition Plan: Admitted.  Home  with home health PT/OT when creatinine has improved, hopefully within the next 24 hours.  Consultants Gastroenterology General  surgery Cardiology  Procedures  EGD MRCP HIDA Echocardiogram  Antibiotics   Anti-infectives (From admission, onward)   Start     Dose/Rate Route Frequency Ordered Stop   06/09/19 2300  piperacillin-tazobactam (ZOSYN) IVPB 3.375 g     3.375 g 12.5 mL/hr over 240 Minutes Intravenous Every 8 hours 06/09/19 2221 06/17/19 2359   06/06/19 0730  piperacillin-tazobactam (ZOSYN) IVPB 3.375 g     3.375 g 100 mL/hr over 30 Minutes Intravenous  Once 06/06/19 0715 06/06/19 0817      Subjective:   [name] seen and examined today.  Patient feeling better.  Has mild abdominal soreness.  Denies current nausea or vomiting.  Denies chest pain, shortness of breath, or headache.  Had a bowel movement.  Objective:   Vitals:   06/13/19 1647 06/13/19 2006 06/14/19 0418 06/14/19 0857  BP: (!) 150/72 (!) 158/78 (!) 170/80 (!) 146/66  Pulse: 89 (!) 128 87 83  Resp: 18 18 19 18   Temp: 99.1 F (37.3 C) 98.2 F (36.8 C) 98.9 F (37.2 C) 98.9 F (37.2 C)  TempSrc: Oral Oral Oral Oral  SpO2: 100% 100% 98% 97%  Weight:      Height:        Intake/Output Summary (Last 24 hours) at 06/14/2019 1220 Last data filed at 06/14/2019 1049 Gross per 24 hour  Intake 832.73 ml  Output 1500 ml  Net -667.27 ml   Filed Weights   06/10/19 2034 06/11/19 2107 06/12/19 2044  Weight: 86 kg 87.2 kg 87.5 kg   Exam  General: Well developed, well nourished, NAD, appears stated age  HEENT: NCAT, mucous membranes moist.   Cardiovascular: S1 S2 auscultated, no rubs, murmurs or gallops. Regular rate and rhythm.  Respiratory: Clear to auscultation bilaterally   Abdomen: Soft, mild TTP, nondistended, + bowel sounds, + drain with serosanguineous fluid  Extremities: warm dry without cyanosis clubbing or edema  Neuro: AAOx3, Nonfocal  Psych: Pleasant, appropriate mood and affect  Data Reviewed: I have personally reviewed following labs and imaging studies  CBC: Recent Labs  Lab 06/10/19 0427 06/11/19 0642  06/12/19 0429 06/13/19 0550 06/14/19 0341  WBC 11.7* 11.8* 16.5* 13.8* 11.9*  HGB 12.0* 12.7* 11.7* 11.4* 11.4*  HCT 36.4* 37.8* 34.7* 34.5* 33.4*  MCV 95.3 94.0 94.3 95.3 93.6  PLT 258 306 295 309 [id number]   Basic Metabolic Panel: Recent Labs  Lab 06/10/19 0427 06/10/19 1322 06/11/19 0642 06/11/19 0703 06/12/19 0429 06/13/19 0550 06/14/19 0341  NA 139  --  140  --  137 138 140  K 2.7* 2.9* 3.0*  --  3.5 2.9* 3.5  CL 100  --  102  --  101 104 105  CO2 30  --  28  --  24 26 27   GLUCOSE 87  --  162*  --  183* 196* 118*  BUN 13  --  6*  --  8 6* 10  CREATININE 1.19  --  1.09  --  1.08 1.00 1.47*  CALCIUM 8.2*  --  8.5*  --  8.2* 7.9* 8.1*  MG  --  1.6*  --  1.6* 1.4* 2.2  --    GFR: Estimated Creatinine Clearance: 38.9 mL/min (A) (by C-G formula based on SCr of 1.47 mg/dL (H)). Liver Function Tests: No results for input(s): AST, ALT, ALKPHOS, BILITOT, PROT, ALBUMIN in the last 168 hours. No results for  input(s): LIPASE, AMYLASE in the last 168 hours. No results for input(s): AMMONIA in the last 168 hours. Coagulation Profile: No results for input(s): INR, PROTIME in the last 168 hours. Cardiac Enzymes: No results for input(s): CKTOTAL, CKMB, CKMBINDEX, TROPONINI in the last 168 hours. BNP (last 3 results) No results for input(s): PROBNP in the last 8760 hours. HbA1C: No results for input(s): HGBA1C in the last 72 hours. CBG: Recent Labs  Lab 06/13/19 1216 06/13/19 1647 06/13/19 2102 06/14/19 0649 06/14/19 1128  GLUCAP 193* 149* 92 108* 240*   Lipid Profile: No results for input(s): CHOL, HDL, LDLCALC, TRIG, CHOLHDL, LDLDIRECT in the last 72 hours. Thyroid Function Tests: No results for input(s): TSH, T4TOTAL, FREET4, T3FREE, THYROIDAB in the last 72 hours. Anemia Panel: No results for input(s): VITAMINB12, FOLATE, FERRITIN, TIBC, IRON, RETICCTPCT in the last 72 hours. Urine analysis:    Component Value Date/Time   COLORURINE YELLOW 06/06/2019 0043    APPEARANCEUR CLEAR 06/06/2019 0043   LABSPEC >1.030 (H) 06/06/2019 0043   PHURINE 6.5 06/06/2019 0043   GLUCOSEU >=500 (A) 06/06/2019 0043   HGBUR TRACE (A) 06/06/2019 0043   BILIRUBINUR NEGATIVE 06/06/2019 0043   KETONESUR 40 (A) 06/06/2019 0043   PROTEINUR >300 (A) 06/06/2019 0043   UROBILINOGEN 0.2 11/10/2012 1432   NITRITE NEGATIVE 06/06/2019 0043   LEUKOCYTESUR NEGATIVE 06/06/2019 0043   Sepsis Labs: @LABRCNTIP (procalcitonin:4,lacticidven:4)  ) Recent Results (from the past 240 hour(s))  SARS CORONAVIRUS 2 Nasal Swab Aptima Multi Swab     Status: None   Collection Time: 06/06/19  2:03 AM   Specimen: Aptima Multi Swab; Nasal Swab  Result Value Ref Range Status   SARS Coronavirus 2 NEGATIVE NEGATIVE Final    Comment: (NOTE) SARS-CoV-2 target nucleic acids are NOT DETECTED. The SARS-CoV-2 RNA is generally detectable in upper and lower respiratory specimens during the acute phase of infection. Negative results do not preclude SARS-CoV-2 infection, do not rule out co-infections with other pathogens, and should not be used as the sole basis for treatment or other patient management decisions. Negative results must be combined with clinical observations, patient history, and epidemiological information. The expected result is Negative. Fact Sheet for Patients: SugarRoll.be Fact Sheet for Healthcare Providers: https://www.woods-mathews.com/ This test is not yet approved or cleared by the [place] FDA and  has been authorized for detection and/or diagnosis of SARS-CoV-2 by FDA under an Emergency Use Authorization (EUA). This EUA will remain  in effect (meaning this test can be used) for the duration of the COVID-19 declaration under Section 56 4(b)(1) of the Act, 21 U.S.C. section 360bbb-3(b)(1), unless the authorization is terminated or revoked sooner. Performed at [facility], [place] [address], [place], [place] [address]    Surgical pcr screen     Status: None   Collection Time: 06/11/19  5:18 AM   Specimen: Nasal Mucosa; Nasal Swab  Result Value Ref Range Status   MRSA, PCR NEGATIVE NEGATIVE Final   Staphylococcus aureus NEGATIVE NEGATIVE Final    Comment: (NOTE) The Xpert SA Assay (FDA approved for NASAL specimens in patients 10 years of age and older), is one component of a comprehensive surveillance program. It is not intended to diagnose infection nor to guide or monitor treatment. Performed at [facility], [place] [address] [place], [place] [address]       Radiology Studies: No results found.   Scheduled Meds: . atorvastatin  40 mg Oral QPM  . diltiazem  180 mg Oral Daily  .  docusate sodium  100 mg Oral BID  . enoxaparin (LOVENOX) injection  40 mg Subcutaneous Q24H  . gabapentin  100 mg Oral TID  . insulin aspart  0-5 Units Subcutaneous QHS  . insulin aspart  0-9 Units Subcutaneous TID WC  . insulin detemir  20 Units Subcutaneous q morning - 10a  . pantoprazole (PROTONIX) IV  40 mg Intravenous Q12H  . sodium chloride flush  3 mL Intravenous Q12H  . sucralfate  1 g Oral TID WC & HS  . traZODone  100 mg Oral QHS   Continuous Infusions: . sodium chloride 75 mL/hr at 06/14/19 0750  . piperacillin-tazobactam (ZOSYN)  IV 3.375 g (06/14/19 0841)     LOS: 7 days   Time Spent in minutes   30 minutes  Douglas Smolinsky D.O. on 06/14/2019 at 12:20 PM  Between 7am to 7pm - Please see pager noted on [email or web address]  After 7pm go to [email or web address]  And look for the night coverage person covering for me after hours  [facility] Office  [phone]

## 2019-06-14 NOTE — Progress Notes (Signed)
3 Days Post-Op   Subjective/Chief Complaint: Feeling well.    Objective: Vital signs in last 24 hours: Temp:  [98.2 F (36.8 C)-99.1 F (37.3 C)] 98.9 F (37.2 C) (08/24 0418) Pulse Rate:  [84-128] 87 (08/24 0418) Resp:  [18-19] 19 (08/24 0418) BP: (150-175)/(72-80) 170/80 (08/24 0418) SpO2:  [95 %-100 %] 98 % (08/24 0418) Last BM Date: 06/10/19  Intake/Output from previous day: 08/23 0701 - 08/24 0700 In: 592.7 [P.O.:480; IV Piggyback:112.7] Out: 800 [Urine:650; Drains:150] Intake/Output this shift: No intake/output data recorded.  General appearance: alert and cooperative Resp: unlabored Cardio: regular rate and rhythm GI: soft, minimally tender at incisions which are c/d/i. No distention. JP output is light serosanguinous  Lab Results:  Recent Labs    06/13/19 0550 06/14/19 0341  WBC 13.8* 11.9*  HGB 11.4* 11.4*  HCT 34.5* 33.4*  PLT 309 355   BMET Recent Labs    06/13/19 0550 06/14/19 0341  NA 138 140  K 2.9* 3.5  CL 104 105  CO2 26 27  GLUCOSE 196* 118*  BUN 6* 10  CREATININE 1.00 1.47*  CALCIUM 7.9* 8.1*   PT/INR No results for input(s): LABPROT, INR in the last 72 hours. ABG No results for input(s): PHART, HCO3 in the last 72 hours.  Invalid input(s): PCO2, PO2  Studies/Results: No results found.  Anti-infectives: Anti-infectives (From admission, onward)   Start     Dose/Rate Route Frequency Ordered Stop   06/09/19 2300  piperacillin-tazobactam (ZOSYN) IVPB 3.375 g     3.375 g 12.5 mL/hr over 240 Minutes Intravenous Every 8 hours 06/09/19 2221     06/06/19 0730  piperacillin-tazobactam (ZOSYN) IVPB 3.375 g     3.375 g 100 mL/hr over 30 Minutes Intravenous  Once 06/06/19 0715 06/06/19 [id number]      Assessment/Plan:  Principal Problem:   Abdominal pain Active Problems:   Essential hypertension   Hyperlipidemia   Diabetes (HCC)   AF (paroxysmal atrial fibrillation) (HCC)   Dehydration, mild   Pancreatic lesion   Gastritis   Acute  kidney injury ([place])  s/p Procedure(s): LAPAROSCOPIC CHOLECYSTECTOMY 06/11/2019  Acute cholecystitis -s/p Laparoscopic Subtotal Cholecystectomy with drain placement - Dr. Brantley Stage - 8/21 -Keep drain in place till follow up - Continue abx for 7 days post-op - continue current diet - continue to monitor for drain output, current serosanguinous  ID -zosyn 8/19>>. WBC downtrending. FEN -IVF,soft diet. Cr up to 1.47 today from 1 yesterday VTE -SCDs, lovenox Foley -none Follow up -Dr. Brantley Stage next week.  Dispo- from standpoint of surgery can be discharged when medically ready. Defer management of incr Cr to primary team. Daughter requesting case management consult for home health needs- consult placed   LOS: 7 days    Clovis Riley 06/14/2019

## 2019-06-14 NOTE — Progress Notes (Signed)
Progress Note  Patient Name: [name] Date of Encounter: 06/14/2019  Primary Cardiologist: Quay Burow, MD   Subjective   Sitting comfortably in chair this AM. Not sleeping well due to positioning but otherwise feels ok. No chest pain. Breathing a little short in the bed but comfortable otherwise. Reviewed afib, plans for management, he is in agreement.  Inpatient Medications    Scheduled Meds: . atorvastatin  40 mg Oral QPM  . diltiazem  180 mg Oral Daily  . docusate sodium  100 mg Oral BID  . enoxaparin (LOVENOX) injection  40 mg Subcutaneous Q24H  . gabapentin  100 mg Oral TID  . insulin aspart  0-5 Units Subcutaneous QHS  . insulin aspart  0-9 Units Subcutaneous TID WC  . insulin detemir  20 Units Subcutaneous q morning - 10a  . pantoprazole (PROTONIX) IV  40 mg Intravenous Q12H  . sodium chloride flush  3 mL Intravenous Q12H  . sucralfate  1 g Oral TID WC & HS  . traZODone  100 mg Oral QHS   Continuous Infusions: . sodium chloride 75 mL/hr at 06/14/19 0750  . piperacillin-tazobactam (ZOSYN)  IV 3.375 g (06/14/19 0841)   PRN Meds: acetaminophen **OR** acetaminophen, fentaNYL (SUBLIMAZE) injection, ondansetron **OR** ondansetron (ZOFRAN) IV   Vital Signs    Vitals:   06/13/19 1647 06/13/19 2006 06/14/19 0418 06/14/19 0857  BP: (!) 150/72 (!) 158/78 (!) 170/80 (!) 146/66  Pulse: 89 (!) 128 87 83  Resp: 18 18 19 18   Temp: 99.1 F (37.3 C) 98.2 F (36.8 C) 98.9 F (37.2 C) 98.9 F (37.2 C)  TempSrc: Oral Oral Oral Oral  SpO2: 100% 100% 98% 97%  Weight:      Height:        Intake/Output Summary (Last 24 hours) at 06/14/2019 0916 Last data filed at 06/14/2019 0856 Gross per 24 hour  Intake 832.73 ml  Output 800 ml  Net 32.73 ml   Last 3 Weights 06/12/2019 06/11/2019 06/10/2019  Weight (lbs) 192 lb 14.4 oz 192 lb 3.9 oz 189 lb 9.5 oz  Weight (kg) 87.5 kg 87.2 kg 86 kg      Telemetry    Atrial fib, rate controlled, with intermittent PVCs - Personally  Reviewed  ECG    No new since 8/16 - Personally Reviewed  Physical Exam   GEN: No acute distress.   Neck: No JVD Cardiac: irregularly irregular S1 and S2 with 1/6 SM. Respiratory: Clear to auscultation bilaterally. GI: Soft, nontender, non-distended  MS: No edema; No deformity. Neuro:  Nonfocal  Psych: Normal affect   Labs    High Sensitivity Troponin:  No results for input(s): TROPONINIHS in the last 720 hours.    Cardiac EnzymesNo results for input(s): TROPONINI in the last 168 hours. No results for input(s): TROPIPOC in the last 168 hours.   Chemistry Recent Labs  Lab 06/12/19 0429 06/13/19 0550 06/14/19 0341  NA 137 138 140  K 3.5 2.9* 3.5  CL 101 104 105  CO2 24 26 27   GLUCOSE 183* 196* 118*  BUN 8 6* 10  CREATININE 1.08 1.00 1.47*  CALCIUM 8.2* 7.9* 8.1*  GFRNONAA >60 >60 42*  GFRAA >60 >60 49*  ANIONGAP 12 8 8      Hematology Recent Labs  Lab 06/12/19 0429 06/13/19 0550 06/14/19 0341  WBC 16.5* 13.8* 11.9*  RBC 3.68* 3.62* 3.57*  HGB 11.7* 11.4* 11.4*  HCT 34.7* 34.5* 33.4*  MCV 94.3 95.3 93.6  MCH 31.8 31.5 31.9  MCHC 33.7 33.0 34.1  RDW 13.2 13.5 13.2  PLT 295 309 355    BNPNo results for input(s): BNP, PROBNP in the last 168 hours.   DDimer No results for input(s): DDIMER in the last 168 hours.   Radiology    No results found.  Cardiac Studies   Echo 06/11/19  1. The left ventricle has normal systolic function with an ejection fraction of 60-65%. The cavity size was normal. There is mildly increased left ventricular wall thickness. Left ventricular diastolic function could not be evaluated secondary to atrial  fibrillation.  2. The right ventricle has normal systolic function. The cavity was normal. There is no increase in right ventricular wall thickness. Right ventricular systolic pressure is moderately elevated.  3. Trivial pericardial effusion is present.  4. The aortic valve is abnormal. Mild thickening of the aortic valve. Mild  calcification of the aortic valve. Aortic valve regurgitation is trivial by color flow Doppler. No stenosis of the aortic valve.  5. The aorta is normal unless otherwise noted.  6. The aortic root and ascending aorta are normal in size and structure.  7. Pulmonary hypertension is moderate.  8. The atrial septum is grossly normal.  Patient Profile     85 y.o. male with a hx of Carotid artery disease s/p remote R CEA, atrial flutter in 2013, mild AI by echo 2013, HTN, DM, HLD, remote bleeding ulcer, recent falls, knee arthritis, hiatal hernia who was seen this admission in consult at the request of Dr. Ree Kida for preoperative clearance and afib. Now s/p lap chole.  Assessment & Plan  Atrial fibrillation: chadsvasc=5, but history of gastritis, recent falls (including hitting his head), and currently with post-cholecystectomy drain in place with plans to keep in place until surgical follow up -though he is elevated stroke risk with a CV of 5, he just had surgery, has a drain in place, and has acute gastritis based on EGD from 06/08/19. Given this, the risk of anticoagulation in the short term is high.  -recommend recovery from surgery, with removal of drain, prior to reassessment for [facility]. Would also recommend GI input. With carotid disease, would typically recommend aspirin, but with gastritis would need GI to clear this. If anticoagulation planned as outpatient, would monitor for GI bleeding closely -well rate controlled on diltiazem 180 mg daily, continue -EF normal, moderate pHTN, no hemodynamically significant valve disease -appears euvolemic  Cholecystitis: s/p lap chole 06/11/19 with signs of gangrenous GB. On IV antibiotics, drain in place.  [facility] On atorvastatin. Aspirin on hold as above given gastritis, recent surgery, possible anticoagulation.  [facility] will sign off.   Medication Recommendations:  Continue diltiazem. No aspirin or anticoagulation until  drain removed, cleared by GI Other recommendations (labs, testing, etc):  None Follow up as an outpatient:  We will arrange for follow up with Dr. Gwenlyn Found.  For questions or updates, please contact [place] Please consult [email or web address] for contact info under     Signed, Buford Dresser, MD  06/14/2019, 9:16 AM

## 2019-06-14 NOTE — Care Management Important Message (Signed)
Important Message  Patient Details  Name: [name] MRN: [id number] Date of Birth: Oct 01, 1930   Medicare Important Message Given:  Yes     Jule Whitsel 06/14/2019, 3:41 PM

## 2019-06-15 ENCOUNTER — Inpatient Hospital Stay (HOSPITAL_COMMUNITY): Payer: Medicare Other

## 2019-06-15 LAB — BASIC METABOLIC PANEL
Anion gap: 12 (ref 5–15)
BUN: 10 mg/dL (ref 8–23)
CO2: 22 mmol/L (ref 22–32)
Calcium: 8.3 mg/dL — ABNORMAL LOW (ref 8.9–10.3)
Chloride: 103 mmol/L (ref 98–111)
Creatinine, Ser: 1.02 mg/dL (ref 0.61–1.24)
GFR calc Af Amer: >60 mL/min (ref >60)
GFR calc non Af Amer: >60 mL/min (ref >60)
Glucose, Bld: 204 mg/dL — ABNORMAL HIGH (ref 70–99)
Potassium: 3.4 mmol/L — ABNORMAL LOW (ref 3.5–5.1)
Sodium: 137 mmol/L (ref 135–145)

## 2019-06-15 LAB — GLUCOSE, CAPILLARY
Glucose-Capillary: 189 mg/dL — ABNORMAL HIGH (ref 70–99)
Glucose-Capillary: 210 mg/dL — ABNORMAL HIGH (ref 70–99)

## 2019-06-15 MED ORDER — POTASSIUM CHLORIDE CRYS ER 20 MEQ PO TBCR
40.0000 meq | EXTENDED_RELEASE_TABLET | Freq: Once | ORAL | Status: AC
  Administered 2019-06-15: 40 meq via ORAL
  Filled 2019-06-15: qty 2

## 2019-06-15 MED ORDER — AMOXICILLIN-POT CLAVULANATE 875-125 MG PO TABS: 1.0000 | ORAL_TABLET | Freq: Two times a day (BID) | ORAL | 0 refills | Status: AC

## 2019-06-15 NOTE — TOC Transition Note (Signed)
Transition of Care [facility]) - CM/SW Discharge Note   Patient Details  Name: [name] MRN: [id number] Date of Birth: [birth year]  Transition of Care [facility]) CM/SW Contact:  Bartholomew Crews, RN Phone Number: [phone] 06/15/2019, 1:42 PM   Clinical Narrative:    Patient to transition home today. Notified Butch Penny at [employer]. Spoke with spouse and patient at the bedside. Verified delivery of 3N1. No further transition of care needs identified.    Final next level of care: [place] Barriers to Discharge: No Barriers Identified   Patient Goals and CMS Choice Patient states their goals for this hospitalization and ongoing recovery are:: return home CMS Medicare.gov Compare Post Acute Care list provided to:: Patient Choice offered to / list presented to : Patient, Spouse  Discharge Placement                       Discharge Plan and Services   Discharge Planning Services: CM Consult Post Acute Care Choice: Home Health, Durable Medical Equipment          DME Arranged: 3-N-1 DME Agency: [employer] Date DME Agency Contacted: 06/14/19 Time DME Agency Contacted: [phone] Representative spoke with at DME Agency: [place]: RN, PT, OT, Social Work [employer] Agency: [place] ([place]) Date [place]: 06/15/19 Time [place]: 1342 Representative spoke with at [place]: [place] ([place]) Interventions     Readmission Risk Interventions No flowsheet data found.

## 2019-06-15 NOTE — Progress Notes (Signed)
4 Days Post-Op   Subjective/Chief Complaint: Reports sharp abdominal pains this morning. No nausea, eating well, had 3 BM last 24 h reports they were normal consistency    Objective: Vital signs in last 24 hours: Temp:  [97.6 F (36.4 C)-98.7 F (37.1 C)] 98.7 F (37.1 C) (08/25 0835) Pulse Rate:  [81-94] 91 (08/25 0835) Resp:  [18-20] 18 (08/25 0835) BP: (144-162)/(64-76) 157/69 (08/25 0835) SpO2:  [98 %-100 %] 98 % (08/25 0835) Weight:  [89.2 kg] 89.2 kg (08/24 2053) Last BM Date: 06/14/19  Intake/Output from previous day: 08/24 0701 - 08/25 0700 In: 2264.5 [P.O.:510; I.V.:1604.4; IV Piggyback:150] Out: 2035 [Urine:1950; Drains:85] Intake/Output this shift: No intake/output data recorded.  General appearance: alert and cooperative Resp: unlabored Cardio: regular rate and rhythm GI: soft, mild distention. Tender in upper mid and right abdomen. JP output is light serosanguinous, incisions are c/d/i  Lab Results:  Recent Labs    06/13/19 0550 06/14/19 0341  WBC 13.8* 11.9*  HGB 11.4* 11.4*  HCT 34.5* 33.4*  PLT 309 355   BMET Recent Labs    06/14/19 0341 06/15/19 0545  NA 140 137  K 3.5 3.4*  CL 105 103  CO2 27 22  GLUCOSE 118* 204*  BUN 10 10  CREATININE 1.47* 1.02  CALCIUM 8.1* 8.3*   PT/INR No results for input(s): LABPROT, INR in the last 72 hours. ABG No results for input(s): PHART, HCO3 in the last 72 hours.  Invalid input(s): PCO2, PO2  Studies/Results: No results found.  Anti-infectives: Anti-infectives (From admission, onward)   Start     Dose/Rate Route Frequency Ordered Stop   06/09/19 2300  piperacillin-tazobactam (ZOSYN) IVPB 3.375 g     3.375 g 12.5 mL/hr over 240 Minutes Intravenous Every 8 hours 06/09/19 2221 06/17/19 2359   06/06/19 0730  piperacillin-tazobactam (ZOSYN) IVPB 3.375 g     3.375 g 100 mL/hr over 30 Minutes Intravenous  Once 06/06/19 0715 06/06/19 [id number]      Assessment/Plan:  Principal Problem:   Abdominal  pain Active Problems:   Essential hypertension   Hyperlipidemia   Diabetes (HCC)   AF (paroxysmal atrial fibrillation) (HCC)   Dehydration, mild   Pancreatic lesion   Gastritis   Acute kidney injury ([place])  s/p Procedure(s): LAPAROSCOPIC CHOLECYSTECTOMY 06/11/2019  Acute cholecystitis -s/p Laparoscopic Subtotal Cholecystectomy with drain placement - Dr. Brantley Stage - 8/21 -Keep drain in place till follow up - Continue abx for 7 days post-op - continue current diet - continue to monitor for drain output, current serosanguinous  ID -zosyn 8/19>>. WBC downtrending. FEN -IVF,soft diet. Cr normal today. VTE -SCDs, lovenox Foley -none Follow up -Dr. Brantley Stage next week.  Dispo- plain films pending for abdominal pain.   LOS: 8 days    Clovis Riley 06/15/2019

## 2019-06-15 NOTE — Progress Notes (Signed)
DISCHARGE NOTE HOME [name] to be discharged Home per MD order. Discussed prescriptions and follow up appointments with the patient. Prescriptions given to patient; medication list explained in detail. Patient verbalized understanding.  Skin clean, dry and intact without evidence of skin break down, no evidence of skin tears noted. IV catheter discontinued intact. Site without signs and symptoms of complications. Dressing and pressure applied. Pt denies pain at the site currently. No complaints noted.  Patient went home with JP drain, Educated on how to care on his drain pt and wife verbalized understanding.   An After Visit Summary (AVS) was printed and given to the patient. Patient escorted via wheelchair, and discharged home via private auto.  Orville Govern, RN

## 2019-06-15 NOTE — Discharge Summary (Signed)
Physician Discharge Summary  [name] [id number] DOB: Nov 12, 1929 DOA: 06/06/2019  PCP: [name], MD  Admit date: 06/06/2019 Discharge date: 06/15/2019  Time spent: 45 minutes  Recommendations for Outpatient Follow-up:  Patient will be discharged to home with home health services.  Patient will need to follow up with primary care provider within one week of discharge, repeat BMP.  Follow up with general surgery, Dr. Brantley Stage. Patient should continue medications as prescribed.  Patient should follow a soft diet.   Discharge Diagnoses:  Abdominal pain/acute cholecystitis Paroxysmal atrial fibrillation Acute kidney injury/dehydration Essential hypertension Hyperlipidemia Diabetes mellitus, type II Hypokalemia/hypomagnesemia   Discharge Condition: Stable  Diet recommendation: Soft- advance to heart healthy/carb modified  Filed Weights   06/11/19 2107 06/12/19 2044 06/14/19 2053  Weight: 87.2 kg 87.5 kg 89.2 kg    History of present illness:  on 06/06/2019 by Dr. Karmen Bongo Xerxes [name] a 85 y.o.malewith medical history significant ofHTN; HLD; and DM presenting as transfer from [facility] for n/v.He developed abdominal pain yesterday before lunch. It worsened and he developed n/v. Epigastric TTP. His last emesis was last night about 11PM. He is still having pain. He had a normal BM yesterday AM. He does not have chest pain, no palpitations. Denies known h/o afib.  I spoke with his wife. He had knee replacement in 2014. Dr. Gwenlyn Found did a pre-op examination and he did have afib at that time. He is taking Diltiazem. His wife has afib and was previously on Plavix but now has a pacer. He has never been on [facility]. His wife requests that I discuss AC with their RN daughter, Denys Steitz, teaches nursing at [employer], [phone]. He has a h/o gastritis and pancreatitis. He did a bleeding ulcer in 1978. The last time he fell, he hit his head hard and hard a big gash  on his head. He felt like his hip gave out, which has happened before. She would like to discuss this further with her parents and siblings and will let us know.  Hospital Course:  Abdominal pain/acute cholecystitis -Gastroenterology consulted and appreciated -EGD showed mild gastritis otherwise normal EGD -CTA A/P reveals hypodense lesion in the body of pancreas -GI recommended MRCP-positive for acute cholecystitis.  No evidence of biliary ductal dilatation or choledocholithiasis.  2.3 cm complex cystic lesion in the pancreatic tail with enhancing central nodule consistent with cystic pancreatic neoplasm -GI now recommends HIDA scan with conservative management. -HIDA scan showed nonvisualization of gallbladder despite morphine augmentation, likely related to cystic duct obstruction and changes of acute cholecystitis. -Continue PPI, pain control, antiemetics, IV fluid -General surgery consulted and appreciated has requested cardiology clearance- per cardiology: "There is currently no contraindication from cardiac standpoint for this gentleman to undergo cholecystectomy." -s/p subtotal cholecystectomy as patient had obscured landmarks, signs of gangrene -General surgery recommending 7 days of antibiotics postop.  Keep drain in place. -Currently on diet and tolerating -Had sharp abdominal pain this morning, abd xray shows normal gas bowel movement -General surgery feels patent can be discharged with outpatient follow up  Paroxysmal atrial fibrillation -Patient states he has seen Dr. Alvester Chou in the past -Currently not on anticoagulation due to history of falls and hitting his head -Currently rate controlled on diltiazem -Cardiology would like to obtain a baseline echocardiogram and would like to start aspirin in the future if ok with gastroenterology  -recommended holding aspirin until drain is removed  Acute kidney injury/dehydration -Likely due to persistent nausea and vomiting prior to  admission -creatinine now  1.02 -was placed back on IVF  -repeat BMP  Essential hypertension -Continue Cardizem -Losartan and HCTZ held- may resume upon discharge  Hyperlipidemia -Continue statin  Diabetes mellitus, type II -Continue Levemir, insulin sliding scale with CBG monitoring -Metformin held- may resume on discharge  Hypokalemia/hypomagnesemia  -replaced repeat BMP in one week -magnesium now 2.2 after replacement  Consultants Gastroenterology General surgery Cardiology  Procedures  EGD MRCP HIDA Echocardiogram  Discharge Exam: Vitals:   06/15/19 0429 06/15/19 0835  BP: (!) 162/64 (!) 157/69  Pulse: 94 91  Resp: 20 18  Temp: 98.6 F (37 C) 98.7 F (37.1 C)  SpO2: 99% 98%     General: Well developed, well nourished, NAD, appears stated age  HEENT: NCAT,  mucous membranes moist.  Cardiovascular: S1 S2 auscultated, RRR, no murmur  Respiratory: Clear to auscultation bilaterally  Abdomen: Soft, appropriate TTP at incisional sites, nondistended, + bowel sounds  Extremities: warm dry without cyanosis clubbing.  Neuro: AAOx3, nonfocal  Psych: pleasant, appropriate mood and affect  Discharge Instructions Discharge Instructions    Discharge instructions   Complete by: As directed    Patient will be discharged to home with home health services.  Patient will need to follow up with primary care provider within one week of discharge, repeat BMP.  Follow up with general surgery, Dr. Brantley Stage. Patient should continue medications as prescribed.  Patient should follow a soft diet.     Allergies as of 06/15/2019   No Known Allergies     Medication List    TAKE these medications   acetaminophen 500 MG tablet Commonly known as: TYLENOL Take 500 mg by mouth every 6 (six) hours as needed. Pain   amoxicillin-clavulanate 875-125 MG tablet Commonly known as: Augmentin Take 1 tablet by mouth 2 (two) times daily for 3 days. Completion of hospital course    atorvastatin 80 MG tablet Commonly known as: LIPITOR Take 80 mg by mouth every evening.   diltiazem 180 MG 24 hr capsule Commonly known as: DILACOR XR Take 180 mg by mouth daily.   gabapentin 100 MG capsule Commonly known as: NEURONTIN Take 200-400 mg by mouth See admin instructions. 200 mg  in the morning and 400 mg at night   gemfibrozil 600 MG tablet Commonly known as: LOPID Take 600 mg by mouth daily.   insulin detemir 100 UNIT/ML injection Commonly known as: LEVEMIR Inject 35-45 Units into the skin See admin instructions. Takes 35 units in the morning and 35 units at night   losartan 100 MG tablet Commonly known as: COZAAR Take 100 mg by mouth daily before breakfast.   metFORMIN 1000 MG tablet Commonly known as: GLUCOPHAGE Take 1,000 mg by mouth 2 (two) times daily with a meal.   Omega-3 1000 MG Caps Take 2 g by mouth 2 (two) times daily.   omeprazole 20 MG capsule Commonly known as: PRILOSEC Take 20 mg by mouth daily.   traZODone 100 MG tablet Commonly known as: DESYREL Take 100 mg by mouth at bedtime as needed for sleep.            Durable Medical Equipment  (From admission, onward)         Start     Ordered   06/14/19 1700  For home use only DME 3 n 1  Once     06/14/19 1659         No Known Allergies Follow-up Information    Erroll Luna, MD. Go on 06/25/2019.   Specialty: General Surgery  Why: 09/04 at 11:20 You will need to arrive 30 minutes prior to your appointment for paperwork. Please bring a copy of your photo ID and insurance card to the appointment.  Contact information: [address] [place] [address] [phone]        [name], MD Follow up.   Specialties: Cardiology, Radiology Why: A follow-up visit with Dr. Gwenlyn Found has been scheduled for 07/13/2019 at 10:45am. Please arrive 15 minutes early for check-in. If this date/time does not work for you, please call our office to reschedule. Contact  information: [address] [place] [address] [phone]        [facility] Follow up.   Why: a clinician will contact you to schedule visits for skilled nurse, physical therapy, occupational therapy, and medical social worker Contact information: [place] [address] [place], [place] [facility] Follow up.   Why: provided bedside commode (3-N-1) Contact information: [address] [place] [address] [phone]        [name], MD. Schedule an appointment as soon as possible for a visit in 1 week(s).   Specialty: Family Medicine Why: Hospital follow up Contact information: [place] [address] [phone]            The results of significant diagnostics from this hospitalization (including imaging, microbiology, ancillary and laboratory) are listed below for reference.    Significant Diagnostic Studies: Dg Chest 2 View  Result Date: 06/08/2019 CLINICAL DATA:  Fever. EXAM: CHEST - 2 VIEW COMPARISON:  January 10, 2017 FINDINGS: The cardiac silhouette is mildly enlarged. Calcific atherosclerotic disease of the aorta. Scattered peribronchial airspace opacities in the left lower and mid thorax. No definite pleural effusion. Chronic mild compression deformity of one of the midthoracic vertebral bodies, likely T7, stable from the prior radiograph. Soft tissues are grossly normal. IMPRESSION: Scattered peribronchial airspace opacities with central predominance in the left lower and mid thorax may represent early bronchopneumonia or developing pulmonary vascular congestion. Electronically Signed   By: Fidela Salisbury M.D.   On: 06/08/2019 15:15   Dg Abd 1 View  Result Date: 06/15/2019 CLINICAL DATA:  Generalized abdominal pain. Status post cholecystectomy 3 days ago. EXAM: ABDOMEN - 1 VIEW COMPARISON:  MRI of the abdomen 06/08/2019. Abdominal ultrasound  06/06/2019. CTA abdomen 06/06/2019 FINDINGS: Bowel gas pattern is normal. Obstruction or free air is present. Atherosclerotic calcifications are present in the aorta. Degenerative changes are noted in the lower lumbar spine. Hips are unremarkable. IMPRESSION: 1. Atherosclerosis. 2. Degenerative changes of the lumbar spine. 3. Normal bowel gas pattern. Electronically Signed   By: San Morelle M.D.   On: 06/15/2019 10:58   Nm Hepatobiliary Liver Func  Result Date: 06/09/2019 CLINICAL DATA:  Right upper quadrant pain and history of pancreatitis EXAM: NUCLEAR MEDICINE HEPATOBILIARY IMAGING TECHNIQUE: Sequential images of the abdomen were obtained out to 60 minutes following intravenous administration of radiopharmaceutical. RADIOPHARMACEUTICALS:  5.2 mCi Tc-109m  Choletec IV COMPARISON:  MRI from 06/08/2019, ultrasound from 06/06/2019 FINDINGS: Prompt uptake and biliary excretion of activity by the liver is seen. Biliary activity passes into small bowel, consistent with patent common bile duct. No visualization of the gallbladder is noted in the first 60 minutes. 3 mg of morphine was then administered and repeat imaging was performed. Gallbladder again did not visualized. IMPRESSION: Nonvisualization of the gallbladder despite morphine augmentation. This is  likely related to cystic duct obstruction and changes of acute cholecystitis. This would correspond with that seen on the recent MRI. These results will be called to the ordering clinician or representative by the Radiologist Assistant, and communication documented in the PACS or zVision Dashboard. Electronically Signed   By: Inez Catalina M.D.   On: 06/09/2019 16:13   Mr 3d Recon At Scanner  Result Date: 06/09/2019 CLINICAL DATA:  Abdominal pain. Pancreatic lesion seen on recent abdomen CTA. EXAM: MRI ABDOMEN WITHOUT AND WITH CONTRAST (INCLUDING MRCP) TECHNIQUE: Multiplanar multisequence MR imaging of the abdomen was performed both before and after the  administration of intravenous contrast. Heavily T2-weighted images of the biliary and pancreatic ducts were obtained, and three-dimensional MRCP images were rendered by post processing. CONTRAST:  9 mL Gadavist COMPARISON:  Abdomen CT on 06/06/2019 FINDINGS: Lower chest: Mild bibasilar atelectasis and tiny right pleural effusion. Hepatobiliary: No hepatic masses identified. No evidence of steatosis. The gallbladder is distended with a small gallstone seen in the neck. Diffuse gallbladder wall thickening and contrast enhancement are seen, consistent with acute cholecystitis. No evidence biliary ductal dilatation or choledocholithiasis. Pancreas: A multiloculated cystic lesion is seen in pancreatic tail which measures 2.3 cm, and shows a small solid areas centrally which demonstrates contrast enhancement. In addition, there are other scattered tiny sub-cm cysts in the pancreatic body and tail. No evidence of main pancreatic ductal dilatation or pancreas divisum. Spleen:  Within normal limits in size and appearance. Adrenals/Urinary Tract: No masses identified. A few tiny renal cysts are noted. No evidence of hydronephrosis. Stomach/Bowel: Diffuse colonic diverticulosis is seen, however there is no evidence of diverticulitis involving visualized portion of colon. Vascular/Lymphatic: No pathologically enlarged lymph nodes identified. No abdominal aortic aneurysm. Other:  None. Musculoskeletal:  No suspicious bone lesions identified. IMPRESSION: Positive for acute cholecystitis. No evidence of biliary ductal dilatation or choledocholithiasis. 2.3 cm complex cystic lesion in pancreatic tail with enhancing central nodule, consistent with cystic pancreatic neoplasm. Other tiny less than 1 cm cystic foci are also noted within the pancreatic body and tail. Recommend further evaluation with EUS/FNA. Bibasilar atelectasis and tiny right pleural effusion. Colonic diverticulosis. These results will be called to the ordering  clinician or representative by the Radiologist Assistant, and communication documented in the PACS or zVision Dashboard. Electronically Signed   By: Marlaine Hind M.D.   On: 06/09/2019 08:56   Mr Abdomen Mrcp Moise Boring Contast  Result Date: 06/09/2019 CLINICAL DATA:  Abdominal pain. Pancreatic lesion seen on recent abdomen CTA. EXAM: MRI ABDOMEN WITHOUT AND WITH CONTRAST (INCLUDING MRCP) TECHNIQUE: Multiplanar multisequence MR imaging of the abdomen was performed both before and after the administration of intravenous contrast. Heavily T2-weighted images of the biliary and pancreatic ducts were obtained, and three-dimensional MRCP images were rendered by post processing. CONTRAST:  9 mL Gadavist COMPARISON:  Abdomen CT on 06/06/2019 FINDINGS: Lower chest: Mild bibasilar atelectasis and tiny right pleural effusion. Hepatobiliary: No hepatic masses identified. No evidence of steatosis. The gallbladder is distended with a small gallstone seen in the neck. Diffuse gallbladder wall thickening and contrast enhancement are seen, consistent with acute cholecystitis. No evidence biliary ductal dilatation or choledocholithiasis. Pancreas: A multiloculated cystic lesion is seen in pancreatic tail which measures 2.3 cm, and shows a small solid areas centrally which demonstrates contrast enhancement. In addition, there are other scattered tiny sub-cm cysts in the pancreatic body and tail. No evidence of main pancreatic ductal dilatation or pancreas divisum. Spleen:  Within normal limits in size  and appearance. Adrenals/Urinary Tract: No masses identified. A few tiny renal cysts are noted. No evidence of hydronephrosis. Stomach/Bowel: Diffuse colonic diverticulosis is seen, however there is no evidence of diverticulitis involving visualized portion of colon. Vascular/Lymphatic: No pathologically enlarged lymph nodes identified. No abdominal aortic aneurysm. Other:  None. Musculoskeletal:  No suspicious bone lesions identified.  IMPRESSION: Positive for acute cholecystitis. No evidence of biliary ductal dilatation or choledocholithiasis. 2.3 cm complex cystic lesion in pancreatic tail with enhancing central nodule, consistent with cystic pancreatic neoplasm. Other tiny less than 1 cm cystic foci are also noted within the pancreatic body and tail. Recommend further evaluation with EUS/FNA. Bibasilar atelectasis and tiny right pleural effusion. Colonic diverticulosis. These results will be called to the ordering clinician or representative by the Radiologist Assistant, and communication documented in the PACS or zVision Dashboard. Electronically Signed   By: Marlaine Hind M.D.   On: 06/09/2019 08:56   Ct Angio Abd/pel W And/or Wo Contrast  Result Date: 06/06/2019 CLINICAL DATA:  85 year old male with abdominal pain and vomiting. EXAM: CTA ABDOMEN AND PELVIS wITHOUT AND WITH CONTRAST TECHNIQUE: Multidetector CT imaging of the abdomen and pelvis was performed using the standard protocol during bolus administration of intravenous contrast. Multiplanar reconstructed images and MIPs were obtained and reviewed to evaluate the vascular anatomy. CONTRAST:  169mL OMNIPAQUE IOHEXOL 350 MG/ML SOLN COMPARISON:  None. FINDINGS: VASCULAR Aorta: There is advanced atherosclerotic calcification. No aneurysmal dilatation or dissection. Celiac: Patent without evidence of aneurysm, dissection, vasculitis or significant stenosis. SMA: Patent without evidence of aneurysm, dissection, vasculitis or significant stenosis. Renals: Atherosclerotic calcification of the renal ostia. The renal arteries are patent. IMA: Patent without evidence of aneurysm, dissection, vasculitis or significant stenosis. Inflow: Patent without evidence of aneurysm, dissection, vasculitis or significant stenosis. Atherosclerotic calcification. Proximal Outflow: Bilateral common femoral and visualized portions of the superficial and profunda femoral arteries are patent without evidence of  aneurysm, dissection, vasculitis or significant stenosis. There is atherosclerotic calcification. Veins: No obvious venous abnormality within the limitations of this arterial phase study. Review of the MIP images confirms the above findings. NON-VASCULAR Lower chest: The visualized lung bases are clear. There is coronary vascular calcification. No intra-abdominal free air or free fluid. Hepatobiliary: Diffuse fatty infiltration of the liver. No intrahepatic biliary ductal dilatation. Gallbladder is mildly distended. Faint focus of high attenuation in the gallbladder neck may represent a vessel although a small stone in the gall bladder neck is not excluded. No pericholecystic fluid. There is however slight apparent hyperemia gallbladder wall. Correlation with ultrasound is recommended Pancreas: The pancreas is moderately atrophic. There is a 13 x 13 mm hypodense lesion in the body of the pancreas which is not characterized but may represent a side branch IPMN. Further evaluation with MRI on a nonemergent basis recommended. No active inflammatory changes. Spleen: Normal in size without focal abnormality. Adrenals/Urinary Tract: Mild left adrenal thickening. The right adrenal gland is unremarkable. Several scattered punctate nonobstructing bilateral renal calculi versus renal vascular calcification. No hydronephrosis. Subcentimeter left renal upper pole hypodensity is not characterized. The visualized ureters and urinary bladder appear unremarkable. Stomach/Bowel: There is severe sigmoid and diffuse colonic diverticulosis without active inflammatory changes. There is no bowel obstruction or active inflammation. The appendix is normal. Lymphatic: No adenopathy. Reproductive: The prostate and seminal vesicles are grossly unremarkable. Other: There is an area of skin thickening and subcutaneous induration of the upper anterior abdominal wall. No fluid collection. Musculoskeletal: Osteopenia with degenerative changes of  the spine. No acute osseous  pathology. IMPRESSION: 1. Mildly distended gallbladder with apparent hyperemia of the gallbladder wall. Correlation with ultrasound recommended to exclude acute cholecystitis. 2. Fatty liver. 3. Severe colonic diverticulosis. No bowel obstruction or active inflammation. Normal appendix. 4. A 13 x 13 mm hypodense lesion in the body of the pancreas may represent a side branch IPMN. Further evaluation with MRI on a nonemergent basis recommended. 5. No abdominal aortic aneurysm dissection. Aortic Atherosclerosis (ICD10-I70.0). Electronically Signed   By: Anner Crete M.D.   On: 06/06/2019 02:16   US Abdomen Limited Ruq  Result Date: 06/06/2019 CLINICAL DATA:  Right upper quadrant pain for 2 days. EXAM: ULTRASOUND ABDOMEN LIMITED RIGHT UPPER QUADRANT COMPARISON:  None. FINDINGS: Gallbladder: A tiny mobile gallstone is seen measuring approximately 5 mm. No evidence of gallbladder wall thickening or pericholecystic fluid. No sonographic Murphy sign noted by sonographer. Common bile duct: Diameter: 2 mm, within normal limits. Liver: No focal lesion identified. Within normal limits in parenchymal echogenicity. Portal vein is patent on color Doppler imaging with normal direction of blood flow towards the liver. Other: None. IMPRESSION: Tiny gallstone. No sonographic evidence of cholecystitis or biliary ductal dilatation. Electronically Signed   By: Marlaine Hind M.D.   On: 06/06/2019 05:21    Microbiology: Recent Results (from the past 240 hour(s))  SARS CORONAVIRUS 2 Nasal Swab Aptima Multi Swab     Status: None   Collection Time: 06/06/19  2:03 AM   Specimen: Aptima Multi Swab; Nasal Swab  Result Value Ref Range Status   SARS Coronavirus 2 NEGATIVE NEGATIVE Final    Comment: (NOTE) SARS-CoV-2 target nucleic acids are NOT DETECTED. The SARS-CoV-2 RNA is generally detectable in upper and lower respiratory specimens during the acute phase of infection. Negative results do not  preclude SARS-CoV-2 infection, do not rule out co-infections with other pathogens, and should not be used as the sole basis for treatment or other patient management decisions. Negative results must be combined with clinical observations, patient history, and epidemiological information. The expected result is Negative. Fact Sheet for Patients: SugarRoll.be Fact Sheet for Healthcare Providers: https://www.woods-mathews.com/ This test is not yet approved or cleared by the [place] FDA and  has been authorized for detection and/or diagnosis of SARS-CoV-2 by FDA under an Emergency Use Authorization (EUA). This EUA will remain  in effect (meaning this test can be used) for the duration of the COVID-19 declaration under Section 56 4(b)(1) of the Act, 21 U.S.C. section 360bbb-3(b)(1), unless the authorization is terminated or revoked sooner. Performed at [facility], [place] [address] [place], [place] [address]   Surgical pcr screen     Status: None   Collection Time: 06/11/19  5:18 AM   Specimen: Nasal Mucosa; Nasal Swab  Result Value Ref Range Status   MRSA, PCR NEGATIVE NEGATIVE Final   Staphylococcus aureus NEGATIVE NEGATIVE Final    Comment: (NOTE) The Xpert SA Assay (FDA approved for NASAL specimens in patients 83 years of age and older), is one component of a comprehensive surveillance program. It is not intended to diagnose infection nor to guide or monitor treatment. Performed at [facility], [place] [address], [place], [place] [address]      Labs: Basic Metabolic Panel: Recent Labs  Lab 06/10/19 1322 06/11/19 [id number] 06/11/19 0703 06/12/19 0429 06/13/19 0550 06/14/19 0341 06/15/19 0545  NA  --  140  --  137 138 140 137  K 2.9* 3.0*  --  3.5 2.9* 3.5 3.4*  CL  --  102  --  101 104 105 103  CO2  --  28  --  24 26 27 22   GLUCOSE  --  162*  --  183* 196* 118* 204*  BUN  --  6*  --  8 6* 10 10  CREATININE  --   1.09  --  1.08 1.00 1.47* 1.02  CALCIUM  --  8.5*  --  8.2* 7.9* 8.1* 8.3*  MG 1.6*  --  1.6* 1.4* 2.2  --   --    Liver Function Tests: No results for input(s): AST, ALT, ALKPHOS, BILITOT, PROT, ALBUMIN in the last 168 hours. No results for input(s): LIPASE, AMYLASE in the last 168 hours. No results for input(s): AMMONIA in the last 168 hours. CBC: Recent Labs  Lab 06/10/19 0427 06/11/19 0642 06/12/19 0429 06/13/19 0550 06/14/19 0341  WBC 11.7* 11.8* 16.5* 13.8* 11.9*  HGB 12.0* 12.7* 11.7* 11.4* 11.4*  HCT 36.4* 37.8* 34.7* 34.5* 33.4*  MCV 95.3 94.0 94.3 95.3 93.6  PLT 258 306 295 309 355   Cardiac Enzymes: No results for input(s): CKTOTAL, CKMB, CKMBINDEX, TROPONINI in the last 168 hours. BNP: BNP (last 3 results) No results for input(s): BNP in the last 8760 hours.  ProBNP (last 3 results) No results for input(s): PROBNP in the last 8760 hours.  CBG: Recent Labs  Lab 06/14/19 1128 06/14/19 1616 06/14/19 2053 06/15/19 0717 06/15/19 1135  GLUCAP 240* 145* 176* 189* 210*       Signed:  Keron Koffman  [facility] 06/15/2019, 1:10 PM

## 2019-06-15 NOTE — Progress Notes (Signed)
Pharmacy Antibiotic Note  [name] is a 85 y.o. male admitted on 06/06/2019 by transfer from OSH ER with abdominal pain and N/V and found to have acute cholecystitis, now s/p lap chole on 8/21. Per surgery, to continue antibiotics for 7 days post-op. Pharmacy has been consulted for Zosyn dosing.   Today is Zosyn D#5/7 of post-op coverage (lap-chole on 8/21) as recommended by surgery. Afeb, WBC 11.9, SCr 1.02, CrCl~50-60 ml/min. Dosing remains appropriate at this time.   Plan: - Continue Zosyn 3.375g IV every 8 hours - extended infusion. - Stop date in place for 8/27 for ~7d post-op coverage as recommended by surgery - Will continue to follow renal function, culture results, LOT, and antibiotic de-escalation plans   Height: 6' (182.9 cm) Weight: 196 lb 10.4 oz (89.2 kg) IBW/kg (Calculated) : 77.6  Temp (24hrs), Avg:98.4 F (36.9 C), Min:97.6 F (36.4 C), Max:98.7 F (37.1 C)  Recent Labs  Lab 06/10/19 0427 06/11/19 0642 06/12/19 0429 06/13/19 0550 06/14/19 0341 06/15/19 0545  WBC 11.7* 11.8* 16.5* 13.8* 11.9*  --   CREATININE 1.19 1.09 1.08 1.00 1.47* 1.02    Estimated Creatinine Clearance: 56 mL/min (by C-G formula based on SCr of 1.02 mg/dL).    No Known Allergies  Antimicrobials this admission: Zosyn 8/17 x1; 8/19 >>  Microbiology results: 8/16 COVID negative 8/21 MRSA PCR: negative   Thank you for allowing pharmacy to be a part of this patient's care.  Alycia Rossetti, PharmD, BCPS Clinical Pharmacist Clinical phone for 06/15/2019: [phone] 06/15/2019 10:36 AM   **Pharmacist phone directory can now be found on [email or web address] ([email or web address]).  Listed under [place].

## 2019-06-22 NOTE — Progress Notes (Signed)
Received call from patient wife, Garen Mcclusky, asking about PT visits. Able to verify [facility] was agency. Verified with liaison that case was opened 8/27 and patient has had 2 RN visits with an OT visit pending for tomorrow. Wife advised and encouraged to ask OT to follow up with PT visit.   Manya Silvas, MSN RN CCM Transitions of Care [phone]

## 2019-07-13 ENCOUNTER — Encounter: Payer: Self-pay | Admitting: Cardiovascular Disease

## 2019-07-13 ENCOUNTER — Other Ambulatory Visit: Payer: Self-pay

## 2019-07-13 ENCOUNTER — Ambulatory Visit (INDEPENDENT_AMBULATORY_CARE_PROVIDER_SITE_OTHER): Payer: Medicare Other | Admitting: Cardiovascular Disease

## 2019-07-13 DIAGNOSIS — I10 Essential (primary) hypertension: Secondary | ICD-10-CM

## 2019-07-13 DIAGNOSIS — E782 Mixed hyperlipidemia: Secondary | ICD-10-CM | POA: Diagnosis not present

## 2019-07-13 DIAGNOSIS — I480 Paroxysmal atrial fibrillation: Secondary | ICD-10-CM

## 2019-07-13 MED ORDER — ASPIRIN EC 81 MG PO TBEC: 81.0000 mg | DELAYED_RELEASE_TABLET | Freq: Every day | ORAL | 3 refills | Status: DC

## 2019-07-13 NOTE — Patient Instructions (Signed)
Medication Instructions:  Your physician has recommended you make the following change in your medication:   START ASPIRIN 81 MG BY MOUTH DAILY  If you need a refill on your cardiac medications before your next appointment, please call your pharmacy.   Lab work: NONE If you have labs (blood work) drawn today and your tests are completely normal, you will receive your results only by: Marland Kitchen MyChart Message (if you have MyChart) OR . A paper copy in the mail If you have any lab test that is abnormal or we need to change your treatment, we will call you to review the results.  Testing/Procedures: NONE  Follow-Up: At [facility], you and your health needs are our priority.  As part of our continuing mission to provide you with exceptional heart care, we have created designated Provider Care Teams.  These Care Teams include your primary Cardiologist (physician) and Advanced Practice Providers (APPs -  Physician Assistants and Nurse Practitioners) who all work together to provide you with the care you need, when you need it. . You will need a follow up appointment in 6 months with an APP and in 12 months with Dr. Quay Burow.  Please call our office 2 months in advance to schedule this/each appointment.  You may see one of the following Advanced Practice Providers on your designated Care Team:   . Kerin Ransom, PA-C . Daleen Snook Kroeger, PA-C . Sande Rives, PA-C ____________________ . Almyra Deforest, PA-C . Fabian Sharp, PA-C . Jory Sims, DNP . Rosaria Ferries, PA-C

## 2019-07-13 NOTE — Assessment & Plan Note (Signed)
History of A. fib which she is asymptomatic from currently not on oral anticoagulation because of age, comorbidities and fall risk.

## 2019-07-13 NOTE — Progress Notes (Signed)
07/13/2019 [name]   Apr 03, 1930  [id number]  Primary Physician [name], MD Primary Cardiologist: [name] MD [name], [name]  HPI:  [name] is a 85 y.o.  mildly overweight, married Caucasian male, father of 8, grandfather to 2 grandchildren who is accompanied by his wife [name] today who is also a patient of mine. He was referred by Dr. Gladstone Lighter for cardiovascular clearance prior to elective total knee replacement.  I last saw him in the office 11/05/2013.  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   His cardiovascular risk factor profile is remarkable for remote tobacco abuse, treated hypertension, diabetes and hyperlipidemia. There is no family history. He did have an elective right carotid endarterectomy many years ago performed by Dr. Sherren Mocha Early who follows his carotid Dopplers. He is otherwise asymptomatic. He did go to the [facility] and had an EKG that showed atrial flutter with variable block on August 18, 2012. He was completely unaware of this. His lipid profile followed by his PCP. Since I saw him a year ago he denies chest pain or shortness of breath.  He was admitted with abdominal pain last month and was found to have acute cholecystitis.  He underwent preoperative clearance by Dr. Meda Coffee who cleared him for the procedure.  He had uncomplicated laparoscopic cholecystectomy.  Currently getting physical therapy.  We issue of atrial fibrillation was raised since he was in A. fib at the time and it was decided not to pursue anticoagulation because of his age, comorbidities and fall risk.  A 2D echo performed during the hospitalization 06/10/2019 revealed normal LV systolic function and normal valvular function.   Current Meds  Medication Sig  . acetaminophen (TYLENOL) 500 MG tablet Take 500 mg by mouth every 6 (six) hours as needed. Pain  . atorvastatin (LIPITOR) 80 MG tablet Take 80 mg by mouth  every evening.   . diltiazem (DILACOR XR) 180 MG 24 hr capsule Take 180 mg by mouth daily.  Marland Kitchen gabapentin (NEURONTIN) 100 MG capsule Take 200-400 mg by mouth See admin instructions. 200 mg  in the morning and 400 mg at night  . gemfibrozil (LOPID) 600 MG tablet Take 600 mg by mouth daily.   . insulin detemir (LEVEMIR) 100 UNIT/ML injection Inject 35-45 Units into the skin See admin instructions. Takes 35 units in the morning and 35 units at night  . losartan (COZAAR) 100 MG tablet Take 100 mg by mouth daily before breakfast.   . metFORMIN (GLUCOPHAGE) 1000 MG tablet Take 1,000 mg by mouth 2 (two) times daily with a meal.  . Omega-3 1000 MG CAPS Take 2 g by mouth 2 (two) times daily.  Marland Kitchen omeprazole (PRILOSEC) 20 MG capsule Take 20 mg by mouth daily.  . traZODone (DESYREL) 100 MG tablet Take 100 mg by mouth at bedtime as needed for sleep.      No Known Allergies  Social History   Socioeconomic History  . Marital status: Married    Spouse name: Not on file  . Number of children: Not on file  . Years of education: Not on file  . Highest education level: Not on file  Occupational History  . Occupation: retired  Scientific laboratory technician  . Financial resource strain: Not on file  . Food insecurity    Worry: Not on file    Inability: Not on file  . Transportation needs    Medical: Not on file  Non-medical: Not on file  Tobacco Use  . Smoking status: Former Smoker    Types: Cigarettes    Quit date: 10/21/1976    Years since quitting: 42.7  . Smokeless tobacco: Never Used  Substance and Sexual Activity  . Alcohol use: Yes    Alcohol/week: 1.0 standard drinks    Types: 1 Glasses of wine per week    Comment: 1-2 alcoholic drinks daily  . Drug use: No  . Sexual activity: Not on file  Lifestyle  . Physical activity    Days per week: Not on file    Minutes per session: Not on file  . Stress: Not on file  Relationships  . Social Herbalist on phone: Not on file    Gets together: Not  on file    Attends religious service: Not on file    Active member of club or organization: Not on file    Attends meetings of clubs or organizations: Not on file    Relationship status: Not on file  . Intimate partner violence    Fear of current or ex partner: Not on file    Emotionally abused: Not on file    Physically abused: Not on file    Forced sexual activity: Not on file  Other Topics Concern  . Not on file  Social History Narrative  . Not on file     Review of Systems: General: negative for chills, fever, night sweats or weight changes.  Cardiovascular: negative for chest pain, dyspnea on exertion, edema, orthopnea, palpitations, paroxysmal nocturnal dyspnea or shortness of breath Dermatological: negative for rash Respiratory: negative for cough or wheezing Urologic: negative for hematuria Abdominal: negative for nausea, vomiting, diarrhea, bright red blood per rectum, melena, or hematemesis Neurologic: negative for visual changes, syncope, or dizziness All other systems reviewed and are otherwise negative except as noted above.    Blood pressure 132/61, pulse 97, height 6' (1.829 m), weight 182 lb (82.6 kg), SpO2 98 %.  General appearance: alert and no distress Neck: no adenopathy, no carotid bruit, no JVD, supple, symmetrical, trachea midline and thyroid not enlarged, symmetric, no tenderness/mass/nodules Lungs: clear to auscultation bilaterally Heart: regular rate and rhythm, S1, S2 normal, no murmur, click, rub or gallop Extremities: extremities normal, atraumatic, no cyanosis or edema Pulses: 2+ and symmetric Skin: Skin color, texture, turgor normal. No rashes or lesions Neurologic: Alert and oriented X 3, normal strength and tone. Normal symmetric reflexes. Normal coordination and gait  EKG not performed today  ASSESSMENT AND PLAN:   Occlusion and stenosis of carotid artery without mention of cerebral infarction History of carotid artery disease status post  remote endarterectomy by Dr. early.  Essential hypertension History of essential hypertension with blood pressure measured today at 132/61.  He is on diltiazem and losartan.  Hyperlipidemia History of hyperlipidemia on high-dose statin therapy followed but at the [facility]  AF (paroxysmal atrial fibrillation) ([place]) History of A. fib which she is asymptomatic from currently not on oral anticoagulation because of age, comorbidities and fall risk.      [name] MD FACP,FACC,FAHA, [facility] 07/13/2019 11:10 AM

## 2019-07-13 NOTE — Assessment & Plan Note (Signed)
History of essential hypertension with blood pressure measured today at 132/61.  He is on diltiazem and losartan.

## 2019-07-13 NOTE — Assessment & Plan Note (Signed)
History of hyperlipidemia on high-dose statin therapy followed but at the [facility]

## 2019-07-13 NOTE — Assessment & Plan Note (Signed)
History of carotid artery disease status post remote endarterectomy by Dr. early.

## 2019-07-15 ENCOUNTER — Encounter (HOSPITAL_COMMUNITY): Payer: Self-pay

## 2019-07-15 ENCOUNTER — Emergency Department (HOSPITAL_COMMUNITY)
Admission: EM | Admit: 2019-07-15 | Discharge: 2019-07-16 | Disposition: A | Discharge status: Home / Self Care | Payer: Medicare Other | Attending: Emergency Medicine | Admitting: Emergency Medicine

## 2019-07-15 ENCOUNTER — Other Ambulatory Visit: Payer: Self-pay

## 2019-07-15 DIAGNOSIS — I10 Essential (primary) hypertension: Secondary | ICD-10-CM | POA: Diagnosis not present

## 2019-07-15 DIAGNOSIS — R748 Abnormal levels of other serum enzymes: Secondary | ICD-10-CM | POA: Diagnosis not present

## 2019-07-15 DIAGNOSIS — R17 Unspecified jaundice: Secondary | ICD-10-CM | POA: Diagnosis not present

## 2019-07-15 DIAGNOSIS — Z87891 Personal history of nicotine dependence: Secondary | ICD-10-CM | POA: Diagnosis not present

## 2019-07-15 DIAGNOSIS — Z7982 Long term (current) use of aspirin: Secondary | ICD-10-CM | POA: Diagnosis not present

## 2019-07-15 DIAGNOSIS — D649 Anemia, unspecified: Secondary | ICD-10-CM | POA: Diagnosis not present

## 2019-07-15 DIAGNOSIS — Z7984 Long term (current) use of oral hypoglycemic drugs: Secondary | ICD-10-CM | POA: Diagnosis not present

## 2019-07-15 DIAGNOSIS — I4891 Unspecified atrial fibrillation: Secondary | ICD-10-CM | POA: Diagnosis not present

## 2019-07-15 DIAGNOSIS — R7989 Other specified abnormal findings of blood chemistry: Secondary | ICD-10-CM | POA: Diagnosis present

## 2019-07-15 DIAGNOSIS — E119 Type 2 diabetes mellitus without complications: Secondary | ICD-10-CM | POA: Insufficient documentation

## 2019-07-15 DIAGNOSIS — Z79899 Other long term (current) drug therapy: Secondary | ICD-10-CM | POA: Insufficient documentation

## 2019-07-15 LAB — URINALYSIS, ROUTINE W REFLEX MICROSCOPIC
Bilirubin Urine: NEGATIVE
Glucose, UA: NEGATIVE mg/dL
Hgb urine dipstick: NEGATIVE
Ketones, ur: NEGATIVE mg/dL
Leukocytes,Ua: NEGATIVE
Nitrite: NEGATIVE
Protein, ur: 100 mg/dL — AB
Specific Gravity, Urine: 1.019 (ref 1.005–1.030)
pH: 5 (ref 5.0–8.0)

## 2019-07-15 LAB — LACTIC ACID, PLASMA: Lactic Acid, Venous: 1.4 mmol/L (ref 0.5–1.9)

## 2019-07-15 LAB — COMPREHENSIVE METABOLIC PANEL
ALT: 184 U/L — ABNORMAL HIGH (ref 0–44)
AST: 74 U/L — ABNORMAL HIGH (ref 15–41)
Albumin: 2.6 g/dL — ABNORMAL LOW (ref 3.5–5.0)
Alkaline Phosphatase: 757 U/L — ABNORMAL HIGH (ref 38–126)
Anion gap: 11 (ref 5–15)
BUN: 20 mg/dL (ref 8–23)
CO2: 26 mmol/L (ref 22–32)
Calcium: 9.2 mg/dL (ref 8.9–10.3)
Chloride: 100 mmol/L (ref 98–111)
Creatinine, Ser: 1.16 mg/dL (ref 0.61–1.24)
GFR calc Af Amer: >60 mL/min (ref >60)
GFR calc non Af Amer: 56 mL/min — ABNORMAL LOW (ref >60)
Glucose, Bld: 198 mg/dL — ABNORMAL HIGH (ref 70–99)
Potassium: 3.6 mmol/L (ref 3.5–5.1)
Sodium: 137 mmol/L (ref 135–145)
Total Bilirubin: 2.3 mg/dL — ABNORMAL HIGH (ref 0.3–1.2)
Total Protein: 6.7 g/dL (ref 6.5–8.1)

## 2019-07-15 LAB — CBC
HCT: 34.2 % — ABNORMAL LOW (ref 39.0–52.0)
Hemoglobin: 11.6 g/dL — ABNORMAL LOW (ref 13.0–17.0)
MCH: 31.4 pg (ref 26.0–34.0)
MCHC: 33.9 g/dL (ref 30.0–36.0)
MCV: 92.7 fL (ref 80.0–100.0)
Platelets: 349 10*3/uL (ref 150–400)
RBC: 3.69 MIL/uL — ABNORMAL LOW (ref 4.22–5.81)
RDW: 14 % (ref 11.5–15.5)
WBC: 12.7 10*3/uL — ABNORMAL HIGH (ref 4.0–10.5)
nRBC: 0 % (ref 0.0–0.2)

## 2019-07-15 LAB — LIPASE, BLOOD: Lipase: 147 U/L — ABNORMAL HIGH (ref 11–51)

## 2019-07-15 NOTE — ED Notes (Signed)
[phone] or [phone] Margery please call if needed about why he was sent from his provider, and for updates

## 2019-07-15 NOTE — ED Provider Notes (Signed)
[facility] EMERGENCY DEPARTMENT Provider Note   CSN: [id number] Arrival date & time: 07/15/19  1837    History   Chief Complaint Chief Complaint  Patient presents with  . Abnormal Lab    HPI [name] is a 85 y.o. male.   The history is provided by the patient.  He has history of diabetes, hyperlipidemia, hypertension, paroxysmal atrial flutter/atrial fibrillation but apparently not on anticoagulation and is 1 month status post cholecystectomy saw his primary care provider today and had blood work and apparently WBC was elevated and some liver tests were elevated he was told to come straight to the ED.  He has been having pain in the right lower abdomen since his surgery, and this is improving slightly.  He has had shaking chills on 3 occasions the past week, most recently 2 days ago.  He denies fever or sweats.  There has been no nausea or vomiting.  His appetite has been good.  Past Medical History:  Diagnosis Date  . Anemia    related to frequent nosebleeds-right nostril.  . Aortic insufficiency    a. mild by echo 2013.  . Arthritis    arthritis -back knees, most joints  . Atrial flutter ([place])    a. per report in 2013.  . Breathing difficulty 11-10-12   difficult to breath if laying posteriorly, right nare difficulty  . Carotid artery disease ([place])    a. Remote R CEA in the 1990s per pt.  . Change in hearing   . Diabetes mellitus age 39  . Dysrhythmia 07/2012   hx. Atrial Flutter x1, converted spontaneously-no ploblems.   . H/O hiatal hernia   . HOH (hard of hearing) 11-10-12   bilaterally  . Hyperlipidemia   . Hypertension   . Ulcer 1995   bleeding ulcer, none since    Patient Active Problem List   Diagnosis Date Noted  . Gastritis 06/08/2019  . Acute kidney injury ([place]) 06/08/2019  . Pancreatic lesion 06/07/2019  . Abdominal pain 06/06/2019  . AF (paroxysmal atrial fibrillation) ([place]) 06/06/2019  . Dehydration, mild 06/06/2019  .  Essential hypertension 11/05/2013  . Hyperlipidemia 11/05/2013  . Diabetes ([place]) 11/05/2013  . Aftercare following surgery of the circulatory system, [place] 06/30/2013  . Primary osteoarthritis of right knee 11/18/2012  . Occlusion and stenosis of carotid artery without mention of cerebral infarction 06/30/2012    Past Surgical History:  Procedure Laterality Date  . CAROTID DUPLEX  06/30/2012   PATENT RIGHT CAROTID ENDARTERECTOMY. 1%-39% LEFT ICA. STABLE  . CAROTID ENDARTERECTOMY  11/242008   right with Dacron patch angioplasty  . CATARACT EXTRACTION  1998 bilateral  done 3 months apart  . CHOLECYSTECTOMY N/A 06/11/2019   Procedure: LAPAROSCOPIC CHOLECYSTECTOMY;  Surgeon: Erroll Luna, MD;  Location: [facility];  Service: General;  Laterality: N/A;  . ESOPHAGOGASTRODUODENOSCOPY (EGD) WITH PROPOFOL N/A 06/08/2019   Procedure: ESOPHAGOGASTRODUODENOSCOPY (EGD) WITH PROPOFOL;  Surgeon: Wilford Corner, MD;  Location: [facility];  Service: Endoscopy;  Laterality: N/A;  . EYE SURGERY    . JOINT REPLACEMENT Right 11-18-12  . KNEE BURSECTOMY  11/18/2012   Procedure: KNEE BURSECTOMY;  Surgeon: Tobi Bastos, MD;  Location: [facility];  Service: Orthopedics;  Laterality: Right;  . NM MYOCAR MULTIPLE W/SPECT  10/08/2012   NORMAL STRESS NUCLEAR STUDY.  . TOTAL KNEE ARTHROPLASTY  11/18/2012   Procedure: TOTAL KNEE ARTHROPLASTY;  Surgeon: Tobi Bastos, MD;  Location: [facility];  Service: Orthopedics;  Laterality: Right;  . TRANSTHORACIC  ECHOCARDIOGRAM  10/08/2012   MODERATE LVH. MODERATE CONCENTRIC HYPERTROPHY.EF 65 TO 70%. GRADE 1 DYSTOLIC DYSFUNCTION. ELEVATED VENTRICULAR END-DIASTOLIC FILLING PRESSURE AND LEFT ATRIAL FILLING PRESSURE. AV- MILD TO MODERATE CALCIFIED ANNULUS. MV- CALCIFIC DEGENERATION.Marland Kitchen LA-MILDLY DILATED.        Home Medications    Prior to Admission medications   Medication Sig Start Date End Date Taking? Authorizing Provider  acetaminophen (TYLENOL) 500 MG tablet Take 500 mg by  mouth every 6 (six) hours as needed. Pain    [provider]  aspirin EC 81 MG tablet Take 1 tablet (81 mg total) by mouth daily. 07/13/19   [name], MD  atorvastatin (LIPITOR) 80 MG tablet Take 80 mg by mouth every evening.     [provider]  diltiazem (DILACOR XR) 180 MG 24 hr capsule Take 180 mg by mouth daily.    [provider]  gabapentin (NEURONTIN) 100 MG capsule Take 200-400 mg by mouth See admin instructions. 200 mg  in the morning and 400 mg at night    [provider]  gemfibrozil (LOPID) 600 MG tablet Take 600 mg by mouth daily.     [provider]  insulin detemir (LEVEMIR) 100 UNIT/ML injection Inject 35-45 Units into the skin See admin instructions. Takes 35 units in the morning and 35 units at night    [provider]  losartan (COZAAR) 100 MG tablet Take 100 mg by mouth daily before breakfast.     [provider]  metFORMIN (GLUCOPHAGE) 1000 MG tablet Take 1,000 mg by mouth 2 (two) times daily with a meal.    [provider]  Omega-3 1000 MG CAPS Take 2 g by mouth 2 (two) times daily.    [provider]  omeprazole (PRILOSEC) 20 MG capsule Take 20 mg by mouth daily.    [provider]  traZODone (DESYREL) 100 MG tablet Take 100 mg by mouth at bedtime as needed for sleep.     [provider]    Family History Family History  Problem Relation Age of Onset  . Cancer Mother        Pancreatic  . Heart disease Mother   . Cancer Brother        pancreactic  . Cancer Daughter        Breast    Social History Social History   Tobacco Use  . Smoking status: Former Smoker    Types: Cigarettes    Quit date: 10/21/1976    Years since quitting: 42.7  . Smokeless tobacco: Never Used  Substance Use Topics  . Alcohol use: Yes    Alcohol/week: 1.0 standard drinks    Types: 1 Glasses of wine per week    Comment: 1-2 alcoholic drinks daily  . Drug use: No     Allergies    Patient has no known allergies.   Review of Systems Review of Systems  All other systems reviewed and are negative.    Physical Exam Updated Vital Signs BP (!) 167/78 (BP Location: Right Arm)   Pulse 100   Temp 98.7 F (37.1 C) (Oral)   Resp 16   SpO2 97%   Physical Exam Vitals signs and nursing note reviewed.    85 year old male, resting comfortably and in no acute distress. Vital signs are significant for elevated blood pressure. Oxygen saturation is 97%, which is normal. Head is normocephalic and atraumatic. PERRLA, EOMI. Oropharynx is clear.  Mild scleral icterus present. Neck is nontender and  supple without adenopathy or JVD. Back is nontender and there is no CVA tenderness. Lungs are clear without rales, wheezes, or rhonchi. Chest is nontender. Heart has regular rate and rhythm without murmur. Abdomen is soft, flat, with mild right upper quadrant and right lower quadrant tenderness.  There is no rebound or guarding.  There are no masses or hepatosplenomegaly and peristalsis is normoactive. Extremities have no cyanosis or edema, full range of motion is present. Skin is warm and dry without rash. Neurologic: Mental status is normal, cranial nerves are intact, there are no motor or sensory deficits.  ED Treatments / Results  Labs (all labs ordered are listed, but only abnormal results are displayed) Labs Reviewed  LIPASE, BLOOD - Abnormal; Notable for the following components:      Result Value   Lipase 147 (*)    All other components within normal limits  COMPREHENSIVE METABOLIC PANEL - Abnormal; Notable for the following components:   Glucose, Bld 198 (*)    Albumin 2.6 (*)    AST 74 (*)    ALT 184 (*)    Alkaline Phosphatase 757 (*)    Total Bilirubin 2.3 (*)    GFR calc non Af Amer 56 (*)    All other components within normal limits  CBC - Abnormal; Notable for the following components:   WBC 12.7 (*)    RBC 3.69 (*)    Hemoglobin 11.6 (*)    HCT 34.2  (*)    All other components within normal limits  URINALYSIS, ROUTINE W REFLEX MICROSCOPIC - Abnormal; Notable for the following components:   Color, Urine AMBER (*)    Protein, ur 100 (*)    Bacteria, UA RARE (*)    All other components within normal limits    EKG None  Radiology US Abdomen Limited  Result Date: 07/16/2019 CLINICAL DATA:  Elevated LFTs.  Status post recent cholecystectomy. EXAM: ULTRASOUND ABDOMEN LIMITED RIGHT UPPER QUADRANT COMPARISON:  Preoperative right upper quadrant ultrasound 06/06/2019 in CT 06/06/2019 FINDINGS: Gallbladder: Surgically absent. Ill-defined fluid collection in the gallbladder fossa. Common bile duct: Diameter: 5 mm, normal.  No visualized choledocholithiasis. Liver: No focal lesion identified. There is a 1 cm echogenic shadowing focus centrally in the right lobe. Within normal limits in parenchymal echogenicity. Portal vein is patent on color Doppler imaging with normal direction of blood flow towards the liver. Other: None. IMPRESSION: 1. No biliary dilatation postcholecystectomy. No evidence of choledocholithiasis. 2. Heterogeneous fluid in the gallbladder fossa is nonspecific. 3. Echogenic foci in the central right lobe of the liver with posterior shadowing may represent focus of air. Electronically Signed   By: Keith Rake M.D.   On: 07/16/2019 01:42    Procedures Procedures   Medications Ordered in ED Medications - No data to display   Initial Impression / Assessment and Plan / ED Course  I have reviewed the triage vital signs and the nursing notes.  Pertinent labs & imaging results that were available during my care of the patient were reviewed by me and considered in my medical decision making (see chart for details).  Abnormal liver function tests and patient 1 month status post cholecystectomy.  I do not have access to labs drawn as outpatient, but labs in the ED are significant for WBC 12.7, hemoglobin 11.6 which is not  significantly changed from baseline, alkaline phosphatase 757, AST 74, ALT 184, bilirubin 2.3.  On August 16, all liver function tests were normal.  This is strongly suggestive of  retained common bile duct stone.  Episodes of's chills are concerning for possible cholangitis.  Will check blood cultures and will send for right upper quadrant ultrasound.  Old records reviewed confirming laparoscopic cholecystectomy on August 21.  Ultrasound shows no evidence of choledocholithiasis, no biliary dilatation.  Since he is eating and not in significant pain, it is felt that his lab abnormalities can be pursued as an outpatient.  He is referred back to his gastroenterologist.  Return precautions discussed.  Final Clinical Impressions(s) / ED Diagnoses   Final diagnoses:  Jaundice  Elevated liver enzymes  Normochromic normocytic anemia  Elevated lipase    ED Discharge Orders    None       Delora Fuel, MD [id number] [phone]

## 2019-07-15 NOTE — ED Triage Notes (Signed)
Pt arrives POV for eval of elevated LFTs, bilirubin and chills from PCP office. Pt had lap CCY last month, seen today by PCP and sent here for further eval for possible retained stone in duct. Pt denies abd pain, N/V/D. Pt is calm and cooperative in triage.

## 2019-07-16 ENCOUNTER — Emergency Department (HOSPITAL_COMMUNITY): Payer: Medicare Other

## 2019-07-16 NOTE — Discharge Instructions (Addendum)
Return if you start running a fever, start vomiting, or develop abdominal pain.

## 2019-07-16 NOTE — ED Notes (Signed)
Returned from [facility]

## 2019-07-16 NOTE — ED Notes (Signed)
[name] will pick up patient. ETA 45 mins

## 2019-07-20 LAB — CULTURE, BLOOD (ROUTINE X 2)
Culture: NO GROWTH
Culture: NO GROWTH
Special Requests: ADEQUATE
Special Requests: ADEQUATE

## 2019-07-26 ENCOUNTER — Other Ambulatory Visit: Payer: Self-pay | Admitting: Gastroenterology

## 2019-07-26 DIAGNOSIS — R1013 Epigastric pain: Secondary | ICD-10-CM

## 2019-07-26 DIAGNOSIS — R932 Abnormal findings on diagnostic imaging of liver and biliary tract: Secondary | ICD-10-CM

## 2019-07-26 DIAGNOSIS — R748 Abnormal levels of other serum enzymes: Secondary | ICD-10-CM

## 2019-08-08 ENCOUNTER — Other Ambulatory Visit: Payer: Medicare Other

## 2019-08-08 ENCOUNTER — Other Ambulatory Visit: Payer: Self-pay

## 2019-08-08 ENCOUNTER — Ambulatory Visit
Admission: RE | Admit: 2019-08-08 | Discharge: 2019-08-08 | Disposition: A | Discharge status: Home / Self Care | Payer: Medicare Other | Source: Ambulatory Visit | Attending: Gastroenterology | Admitting: Gastroenterology

## 2019-08-08 DIAGNOSIS — R932 Abnormal findings on diagnostic imaging of liver and biliary tract: Secondary | ICD-10-CM

## 2019-08-08 DIAGNOSIS — R748 Abnormal levels of other serum enzymes: Secondary | ICD-10-CM

## 2019-08-08 DIAGNOSIS — R1013 Epigastric pain: Secondary | ICD-10-CM

## 2019-08-08 MED ORDER — GADOBUTROL 1 MMOL/ML IV SOLN
9.0000 mL | Freq: Once | INTRAVENOUS | Status: AC | PRN
  Administered 2019-08-08: 9 mL via INTRAVENOUS

## 2020-01-31 ENCOUNTER — Ambulatory Visit
Admission: RE | Admit: 2020-01-31 | Discharge: 2020-01-31 | Disposition: A | Discharge status: Home / Self Care | Payer: Medicare PPO | Source: Ambulatory Visit | Attending: Gastroenterology | Admitting: Gastroenterology

## 2020-01-31 ENCOUNTER — Other Ambulatory Visit: Payer: Self-pay

## 2020-01-31 ENCOUNTER — Other Ambulatory Visit: Payer: Self-pay | Admitting: Gastroenterology

## 2020-01-31 DIAGNOSIS — R1030 Lower abdominal pain, unspecified: Secondary | ICD-10-CM

## 2020-02-15 ENCOUNTER — Other Ambulatory Visit: Payer: Self-pay | Admitting: Gastroenterology

## 2020-02-15 DIAGNOSIS — R1030 Lower abdominal pain, unspecified: Secondary | ICD-10-CM

## 2020-02-15 DIAGNOSIS — K862 Cyst of pancreas: Secondary | ICD-10-CM

## 2020-03-11 ENCOUNTER — Ambulatory Visit
Admission: RE | Admit: 2020-03-11 | Discharge: 2020-03-11 | Disposition: A | Discharge status: Home / Self Care | Payer: Medicare PPO | Source: Ambulatory Visit | Attending: Gastroenterology | Admitting: Gastroenterology

## 2020-03-11 ENCOUNTER — Other Ambulatory Visit: Payer: Self-pay

## 2020-03-11 DIAGNOSIS — K862 Cyst of pancreas: Secondary | ICD-10-CM

## 2020-03-11 DIAGNOSIS — R1030 Lower abdominal pain, unspecified: Secondary | ICD-10-CM

## 2020-03-11 MED ORDER — GADOBENATE DIMEGLUMINE 529 MG/ML IV SOLN
17.0000 mL | Freq: Once | INTRAVENOUS | Status: AC | PRN
  Administered 2020-03-11: 17 mL via INTRAVENOUS

## 2020-06-30 ENCOUNTER — Other Ambulatory Visit (HOSPITAL_COMMUNITY)
Admission: RE | Admit: 2020-06-30 | Discharge: 2020-06-30 | Disposition: A | Discharge status: Home / Self Care | Payer: Medicare PPO | Source: Other Acute Inpatient Hospital | Attending: Ophthalmology | Admitting: Ophthalmology

## 2020-06-30 DIAGNOSIS — R519 Headache, unspecified: Secondary | ICD-10-CM | POA: Insufficient documentation

## 2020-06-30 DIAGNOSIS — R220 Localized swelling, mass and lump, head: Secondary | ICD-10-CM | POA: Diagnosis not present

## 2020-06-30 LAB — CBC WITH DIFFERENTIAL/PLATELET
Abs Immature Granulocytes: 0.05 10*3/uL (ref 0.00–0.07)
Basophils Absolute: 0 10*3/uL (ref 0.0–0.1)
Basophils Relative: 0 %
Eosinophils Absolute: 0.1 10*3/uL (ref 0.0–0.5)
Eosinophils Relative: 1 %
HCT: 40 % (ref 39.0–52.0)
Hemoglobin: 12.9 g/dL — ABNORMAL LOW (ref 13.0–17.0)
Immature Granulocytes: 0 %
Lymphocytes Relative: 7 %
Lymphs Abs: 0.8 10*3/uL (ref 0.7–4.0)
MCH: 31 pg (ref 26.0–34.0)
MCHC: 32.3 g/dL (ref 30.0–36.0)
MCV: 96.2 fL (ref 80.0–100.0)
Monocytes Absolute: 0.4 10*3/uL (ref 0.1–1.0)
Monocytes Relative: 4 %
Neutro Abs: 10 10*3/uL — ABNORMAL HIGH (ref 1.7–7.7)
Neutrophils Relative %: 88 %
Platelets: 236 10*3/uL (ref 150–400)
RBC: 4.16 MIL/uL — ABNORMAL LOW (ref 4.22–5.81)
RDW: 13.6 % (ref 11.5–15.5)
WBC: 11.4 10*3/uL — ABNORMAL HIGH (ref 4.0–10.5)
nRBC: 0 % (ref 0.0–0.2)

## 2020-06-30 LAB — C-REACTIVE PROTEIN: CRP: 0.8 mg/dL (ref <1.0)

## 2020-06-30 LAB — SEDIMENTATION RATE: Sed Rate: 16 mm/hr (ref 0–16)

## 2020-07-24 ENCOUNTER — Other Ambulatory Visit (HOSPITAL_COMMUNITY)
Admission: RE | Admit: 2020-07-24 | Discharge: 2020-07-24 | Disposition: A | Discharge status: Home / Self Care | Payer: Medicare PPO | Source: Other Acute Inpatient Hospital | Attending: Ophthalmology | Admitting: Ophthalmology

## 2020-07-24 DIAGNOSIS — R519 Headache, unspecified: Secondary | ICD-10-CM | POA: Insufficient documentation

## 2020-07-24 LAB — CBC WITH DIFFERENTIAL/PLATELET
Abs Immature Granulocytes: 0.08 10*3/uL — ABNORMAL HIGH (ref 0.00–0.07)
Basophils Absolute: 0.1 10*3/uL (ref 0.0–0.1)
Basophils Relative: 1 %
Eosinophils Absolute: 0.8 10*3/uL — ABNORMAL HIGH (ref 0.0–0.5)
Eosinophils Relative: 7 %
HCT: 38.8 % — ABNORMAL LOW (ref 39.0–52.0)
Hemoglobin: 12.6 g/dL — ABNORMAL LOW (ref 13.0–17.0)
Immature Granulocytes: 1 %
Lymphocytes Relative: 15 %
Lymphs Abs: 1.7 10*3/uL (ref 0.7–4.0)
MCH: 31.4 pg (ref 26.0–34.0)
MCHC: 32.5 g/dL (ref 30.0–36.0)
MCV: 96.8 fL (ref 80.0–100.0)
Monocytes Absolute: 1.1 10*3/uL — ABNORMAL HIGH (ref 0.1–1.0)
Monocytes Relative: 9 %
Neutro Abs: 7.9 10*3/uL — ABNORMAL HIGH (ref 1.7–7.7)
Neutrophils Relative %: 67 %
Platelets: 275 10*3/uL (ref 150–400)
RBC: 4.01 MIL/uL — ABNORMAL LOW (ref 4.22–5.81)
RDW: 14 % (ref 11.5–15.5)
WBC: 11.5 10*3/uL — ABNORMAL HIGH (ref 4.0–10.5)
nRBC: 0 % (ref 0.0–0.2)

## 2020-07-24 LAB — C-REACTIVE PROTEIN: CRP: 0.9 mg/dL (ref <1.0)

## 2020-07-24 LAB — SEDIMENTATION RATE: Sed Rate: 23 mm/hr — ABNORMAL HIGH (ref 0–16)

## 2020-09-11 ENCOUNTER — Other Ambulatory Visit (HOSPITAL_COMMUNITY)
Admission: RE | Admit: 2020-09-11 | Discharge: 2020-09-11 | Disposition: A | Discharge status: Home / Self Care | Payer: Medicare PPO | Source: Ambulatory Visit | Attending: Ophthalmology | Admitting: Ophthalmology

## 2020-09-11 DIAGNOSIS — R519 Headache, unspecified: Secondary | ICD-10-CM | POA: Insufficient documentation

## 2020-09-11 LAB — CBC WITH DIFFERENTIAL/PLATELET
Abs Immature Granulocytes: 0.03 10*3/uL (ref 0.00–0.07)
Basophils Absolute: 0.1 10*3/uL (ref 0.0–0.1)
Basophils Relative: 1 %
Eosinophils Absolute: 0.6 10*3/uL — ABNORMAL HIGH (ref 0.0–0.5)
Eosinophils Relative: 8 %
HCT: 38.6 % — ABNORMAL LOW (ref 39.0–52.0)
Hemoglobin: 12.4 g/dL — ABNORMAL LOW (ref 13.0–17.0)
Immature Granulocytes: 0 %
Lymphocytes Relative: 17 %
Lymphs Abs: 1.4 10*3/uL (ref 0.7–4.0)
MCH: 31.1 pg (ref 26.0–34.0)
MCHC: 32.1 g/dL (ref 30.0–36.0)
MCV: 96.7 fL (ref 80.0–100.0)
Monocytes Absolute: 0.8 10*3/uL (ref 0.1–1.0)
Monocytes Relative: 10 %
Neutro Abs: 5.3 10*3/uL (ref 1.7–7.7)
Neutrophils Relative %: 64 %
Platelets: 236 10*3/uL (ref 150–400)
RBC: 3.99 MIL/uL — ABNORMAL LOW (ref 4.22–5.81)
RDW: 14.2 % (ref 11.5–15.5)
WBC: 8.2 10*3/uL (ref 4.0–10.5)
nRBC: 0 % (ref 0.0–0.2)

## 2020-09-11 LAB — C-REACTIVE PROTEIN: CRP: 0.7 mg/dL (ref <1.0)

## 2020-09-11 LAB — SEDIMENTATION RATE: Sed Rate: 12 mm/hr (ref 0–16)

## 2020-09-19 DIAGNOSIS — Z961 Presence of intraocular lens: Secondary | ICD-10-CM | POA: Diagnosis not present

## 2020-09-19 DIAGNOSIS — H401231 Low-tension glaucoma, bilateral, mild stage: Secondary | ICD-10-CM | POA: Diagnosis not present

## 2020-09-19 DIAGNOSIS — H519 Unspecified disorder of binocular movement: Secondary | ICD-10-CM | POA: Diagnosis not present

## 2020-09-21 DIAGNOSIS — E1142 Type 2 diabetes mellitus with diabetic polyneuropathy: Secondary | ICD-10-CM | POA: Diagnosis not present

## 2020-09-21 DIAGNOSIS — I700 Atherosclerosis of aorta: Secondary | ICD-10-CM | POA: Diagnosis not present

## 2020-09-21 DIAGNOSIS — M1710 Unilateral primary osteoarthritis, unspecified knee: Secondary | ICD-10-CM | POA: Diagnosis not present

## 2020-09-21 DIAGNOSIS — I679 Cerebrovascular disease, unspecified: Secondary | ICD-10-CM | POA: Diagnosis not present

## 2020-09-21 DIAGNOSIS — Z0000 Encounter for general adult medical examination without abnormal findings: Secondary | ICD-10-CM | POA: Diagnosis not present

## 2020-09-21 DIAGNOSIS — E782 Mixed hyperlipidemia: Secondary | ICD-10-CM | POA: Diagnosis not present

## 2020-09-21 DIAGNOSIS — G629 Polyneuropathy, unspecified: Secondary | ICD-10-CM | POA: Diagnosis not present

## 2020-09-21 DIAGNOSIS — I10 Essential (primary) hypertension: Secondary | ICD-10-CM | POA: Diagnosis not present

## 2020-09-21 DIAGNOSIS — Z794 Long term (current) use of insulin: Secondary | ICD-10-CM | POA: Diagnosis not present

## 2020-09-26 DIAGNOSIS — D225 Melanocytic nevi of trunk: Secondary | ICD-10-CM | POA: Diagnosis not present

## 2020-09-26 DIAGNOSIS — I872 Venous insufficiency (chronic) (peripheral): Secondary | ICD-10-CM | POA: Diagnosis not present

## 2020-09-26 DIAGNOSIS — D1801 Hemangioma of skin and subcutaneous tissue: Secondary | ICD-10-CM | POA: Diagnosis not present

## 2020-09-26 DIAGNOSIS — I8311 Varicose veins of right lower extremity with inflammation: Secondary | ICD-10-CM | POA: Diagnosis not present

## 2020-09-26 DIAGNOSIS — Z85828 Personal history of other malignant neoplasm of skin: Secondary | ICD-10-CM | POA: Diagnosis not present

## 2020-09-26 DIAGNOSIS — D692 Other nonthrombocytopenic purpura: Secondary | ICD-10-CM | POA: Diagnosis not present

## 2020-09-26 DIAGNOSIS — L820 Inflamed seborrheic keratosis: Secondary | ICD-10-CM | POA: Diagnosis not present

## 2020-09-26 DIAGNOSIS — I8312 Varicose veins of left lower extremity with inflammation: Secondary | ICD-10-CM | POA: Diagnosis not present

## 2020-09-26 DIAGNOSIS — L821 Other seborrheic keratosis: Secondary | ICD-10-CM | POA: Diagnosis not present

## 2020-12-27 DIAGNOSIS — H903 Sensorineural hearing loss, bilateral: Secondary | ICD-10-CM | POA: Diagnosis not present

## 2021-01-08 DIAGNOSIS — H903 Sensorineural hearing loss, bilateral: Secondary | ICD-10-CM | POA: Diagnosis not present

## 2021-03-30 DIAGNOSIS — H524 Presbyopia: Secondary | ICD-10-CM | POA: Diagnosis not present

## 2021-03-30 DIAGNOSIS — E119 Type 2 diabetes mellitus without complications: Secondary | ICD-10-CM | POA: Diagnosis not present

## 2021-03-30 DIAGNOSIS — H40013 Open angle with borderline findings, low risk, bilateral: Secondary | ICD-10-CM | POA: Diagnosis not present

## 2021-07-03 DIAGNOSIS — E1142 Type 2 diabetes mellitus with diabetic polyneuropathy: Secondary | ICD-10-CM | POA: Diagnosis not present

## 2021-07-03 DIAGNOSIS — I10 Essential (primary) hypertension: Secondary | ICD-10-CM | POA: Diagnosis not present

## 2021-07-03 DIAGNOSIS — E1151 Type 2 diabetes mellitus with diabetic peripheral angiopathy without gangrene: Secondary | ICD-10-CM | POA: Diagnosis not present

## 2021-07-03 DIAGNOSIS — E785 Hyperlipidemia, unspecified: Secondary | ICD-10-CM | POA: Diagnosis not present

## 2021-07-03 DIAGNOSIS — K219 Gastro-esophageal reflux disease without esophagitis: Secondary | ICD-10-CM | POA: Diagnosis not present

## 2021-07-03 DIAGNOSIS — G4700 Insomnia, unspecified: Secondary | ICD-10-CM | POA: Diagnosis not present

## 2021-07-03 DIAGNOSIS — G8929 Other chronic pain: Secondary | ICD-10-CM | POA: Diagnosis not present

## 2021-07-03 DIAGNOSIS — Z794 Long term (current) use of insulin: Secondary | ICD-10-CM | POA: Diagnosis not present

## 2021-07-03 DIAGNOSIS — M1990 Unspecified osteoarthritis, unspecified site: Secondary | ICD-10-CM | POA: Diagnosis not present

## 2021-07-30 DIAGNOSIS — I4891 Unspecified atrial fibrillation: Secondary | ICD-10-CM | POA: Diagnosis not present

## 2021-07-30 DIAGNOSIS — R233 Spontaneous ecchymoses: Secondary | ICD-10-CM | POA: Diagnosis not present

## 2021-07-30 DIAGNOSIS — D6869 Other thrombophilia: Secondary | ICD-10-CM | POA: Diagnosis not present

## 2021-07-30 DIAGNOSIS — R0789 Other chest pain: Secondary | ICD-10-CM | POA: Diagnosis not present

## 2021-07-31 ENCOUNTER — Telehealth: Payer: Self-pay | Admitting: Cardiovascular Disease

## 2021-07-31 NOTE — Telephone Encounter (Signed)
Received urgent referral via [place] for patient to be seen for chest discomfort. There is currently no availability at [facility] to schedule patient without work in. Please advise.

## 2021-07-31 NOTE — Telephone Encounter (Signed)
Established patient of Dr. Kennon Holter.  Last OV 2020.  Will check with primary nurse to see if any sooner availability.

## 2021-08-01 ENCOUNTER — Other Ambulatory Visit: Payer: Self-pay

## 2021-08-01 ENCOUNTER — Other Ambulatory Visit: Payer: Self-pay | Admitting: Cardiovascular Disease

## 2021-08-01 ENCOUNTER — Ambulatory Visit: Payer: Medicare PPO | Admitting: Cardiovascular Disease

## 2021-08-01 ENCOUNTER — Encounter: Payer: Self-pay | Admitting: Cardiovascular Disease

## 2021-08-01 VITALS — BP 130/62 | HR 76 | Ht 72.0 in | Wt 210.6 lb

## 2021-08-01 DIAGNOSIS — I10 Essential (primary) hypertension: Secondary | ICD-10-CM | POA: Diagnosis not present

## 2021-08-01 DIAGNOSIS — I200 Unstable angina: Secondary | ICD-10-CM

## 2021-08-01 DIAGNOSIS — I480 Paroxysmal atrial fibrillation: Secondary | ICD-10-CM

## 2021-08-01 DIAGNOSIS — R079 Chest pain, unspecified: Secondary | ICD-10-CM | POA: Diagnosis not present

## 2021-08-01 DIAGNOSIS — E782 Mixed hyperlipidemia: Secondary | ICD-10-CM | POA: Diagnosis not present

## 2021-08-01 MED ORDER — METOPROLOL TARTRATE 50 MG PO TABS: 100.0000 mg | ORAL_TABLET | Freq: Once | ORAL | 0 refills | Status: DC

## 2021-08-01 MED ORDER — ISOSORBIDE MONONITRATE ER 30 MG PO TB24: 15.0000 mg | ORAL_TABLET | Freq: Every day | ORAL | 1 refills | Status: DC

## 2021-08-01 NOTE — Assessment & Plan Note (Signed)
History of carotid artery disease status post elective right carotid endarterectomy by Dr. Donnetta Hutching many years ago followed by carotid Doppler studies.

## 2021-08-01 NOTE — Patient Instructions (Addendum)
Medication Instructions:   -Start taking isosorbide mononitrate (imdur) 15mg  once daily.  *If you need a refill on your cardiac medications before your next appointment, please call your pharmacy*   Lab Work: Your physician recommends that you have labs drawn today: BMET  If you have labs (blood work) drawn today and your tests are completely normal, you will receive your results only by: [place] (if you have MyChart) OR A paper copy in the mail If you have any lab test that is abnormal or we need to change your treatment, we will call you to review the results.   Testing/Procedures: Your physician has requested that you have an echocardiogram. Echocardiography is a painless test that uses sound waves to create images of your heart. It provides your doctor with information about the size and shape of your heart and how well your heart's chambers and valves are working. This procedure takes approximately one hour. There are no restrictions for this procedure.    Your cardiac CT will be scheduled at one of the below locations:   [facility] [address] [place], [place] [address] [phone]  [place] [address] [place], [place] [address] [phone]  If scheduled at [facility], please arrive at the [facility] main entrance (entrance A) of [facility] 30 minutes prior to test start time. Proceed to the [facility] Radiology Department (first floor) to check-in and test prep.  If scheduled at [facility], please arrive 15 mins early for check-in and test prep.  Please follow these instructions carefully (unless otherwise directed):  Hold all erectile dysfunction medications at least 3 days (72 hrs) prior to test.  On the Night Before the Test: Be sure to Drink plenty of water. Do not consume any caffeinated/decaffeinated beverages or chocolate 12  hours prior to your test. Do not take any antihistamines 12 hours prior to your test.   On the Day of the Test: Drink plenty of water until 1 hour prior to the test. Do not eat any food 4 hours prior to the test. You may take your regular medications prior to the test.  Take metoprolol (Lopressor) 100mg  two hours prior to test. HOLD Furosemide/Hydrochlorothiazide morning of the test. FEMALES- please wear underwire-free bra if available, avoid dresses & tight clothing       After the Test: Drink plenty of water. After receiving IV contrast, you may experience a mild flushed feeling. This is normal. On occasion, you may experience a mild rash up to 24 hours after the test. This is not dangerous. If this occurs, you can take Benadryl 25 mg and increase your fluid intake. If you experience trouble breathing, this can be serious. If it is severe call 911 IMMEDIATELY. If it is mild, please call our office. If you take any of these medications: Glipizide/Metformin, Avandament, Glucavance, please do not take 48 hours after completing test unless otherwise instructed.  Please allow 2-4 weeks for scheduling of routine cardiac CTs. Some insurance companies require a pre-authorization which may delay scheduling of this test.   For non-scheduling related questions, please contact the cardiac imaging nurse navigator should you have any questions/concerns: Marchia Bond, Cardiac Imaging Nurse Navigator Gordy Clement, Cardiac Imaging Nurse Navigator [facility] Direct Office Dial: [phone]   For scheduling needs, including cancellations and rescheduling, please call [place], [phone].    Follow-Up: At [facility], you and your health  needs are our priority.  As part of our continuing mission to provide you with exceptional heart care, we have created designated Provider Care Teams.  These Care Teams include your primary Cardiologist (physician) and Advanced  Practice Providers (APPs -  Physician Assistants and Nurse Practitioners) who all work together to provide you with the care you need, when you need it.  We recommend signing up for the patient portal called "MyChart".  Sign up information is provided on this After Visit Summary.  MyChart is used to connect with patients for Virtual Visits (Telemedicine).  Patients are able to view lab/test results, encounter notes, upcoming appointments, etc.  Non-urgent messages can be sent to your provider as well.   To learn more about what you can do with MyChart, go to [email or web address].    Your next appointment:   2 week(s)  The format for your next appointment:   In Person  Provider:   Quay Burow, MD

## 2021-08-01 NOTE — Assessment & Plan Note (Signed)
[name] has complained of new onset exertional chest pain and shortness of breath over the last several weeks.  He has no prior history of CAD.  I am going to get a 2D echo and a coronary CTA to further evaluate.

## 2021-08-01 NOTE — Assessment & Plan Note (Signed)
History of hyperlipidemia on statin therapy followed by his PCP 

## 2021-08-01 NOTE — Assessment & Plan Note (Signed)
History of persistent A. fib rate controlled not on oral anticoagulant because of age, comorbidities and fall risk

## 2021-08-01 NOTE — Assessment & Plan Note (Signed)
History of essential hypertension blood pressure measured today 130/62.  He is on diltiazem and losartan.

## 2021-08-01 NOTE — Progress Notes (Signed)
08/01/2021 [name]   06-11-1930  [id number]  Primary Physician Robert Melter, MD Primary Cardiologist: Robert Harp MD [name], [name]  HPI:  [name] is a 87 y.o.  mildly overweight, married Caucasian male, father of 73, grandfather to 2 grandchildren who is accompanied by his wife [name] who is also a patient of mine. He was referred by [name] for cardiovascular clearance prior to elective total knee replacement.  I last saw him in the office 07/13/2019.  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   His cardiovascular risk factor profile is remarkable for remote tobacco abuse, treated hypertension, diabetes and hyperlipidemia. There is no family history. He did have an elective right carotid endarterectomy many years ago performed by Dr. Sherren [name] Early who follows his carotid Dopplers. He is otherwise asymptomatic. He did go to the [name] and had an EKG that showed atrial flutter with variable block on August 18, 2012. He was completely unaware of this. His lipid profile followed by his PCP. Since I saw him a year ago he denies chest pain or shortness of breath.   He was admitted with abdominal pain last month and was found to have acute cholecystitis.  He underwent preoperative clearance by Dr. Meda [name] who cleared him for the procedure.  He had uncomplicated laparoscopic cholecystectomy.  Currently getting physical therapy.  We issue of atrial fibrillation was raised since he was in A. fib at the time and it was decided not to pursue anticoagulation because of his age, comorbidities and fall risk.  A 2D echo performed during the hospitalization 06/10/2019 revealed normal LV systolic function and normal valvular function.  Since I saw him 2 years ago he was doing well until several weeks ago when he noticed new onset exertional dyspnea and substernal chest pain.   Current Meds  Medication Sig    acetaminophen (TYLENOL) 500 MG tablet Take 500 mg by mouth every 6 (six) hours as needed. Pain   aspirin EC 81 MG tablet Take 1 tablet (81 mg total) by mouth daily.   atorvastatin (LIPITOR) 80 MG tablet Take 80 mg by mouth every evening.    diltiazem (DILACOR XR) 180 MG 24 hr capsule Take 180 mg by mouth daily.   gabapentin (NEURONTIN) 100 MG capsule Take 200-400 mg by mouth See admin instructions. 600 mg  in the morning and 00 mg at night   gemfibrozil (LOPID) 600 MG tablet Take 600 mg by mouth daily.    insulin detemir (LEVEMIR) 100 UNIT/ML injection Inject 35-45 Units into the skin See admin instructions. Takes 35 units in the morning and 35 units at night   losartan (COZAAR) 100 MG tablet Take 100 mg by mouth daily before breakfast.    Omega-3 1000 MG CAPS Take 2 g by mouth 2 (two) times daily.   omeprazole (PRILOSEC) 20 MG capsule Take 20 mg by mouth daily.   pioglitazone (ACTOS) 30 MG tablet Take 30 mg by mouth daily.   traZODone (DESYREL) 100 MG tablet Take 100 mg by mouth at bedtime as needed for sleep.      No Known Allergies  Social History   Socioeconomic History   Marital status: Married    Spouse name: Not on file   Number of children: Not on file   Years of education: Not on file   Highest education level: Not on file  Occupational History   Occupation: retired  Tobacco  Use   Smoking status: Former    Types: Cigarettes    Quit date: 10/21/1976    Years since quitting: 44.8   Smokeless tobacco: Never  Substance and Sexual Activity   Alcohol use: Yes    Alcohol/week: 1.0 standard drink    Types: 1 Glasses of wine per week    Comment: 1-2 alcoholic drinks daily   Drug use: No   Sexual activity: Not on file  Other Topics Concern   Not on file  Social History Narrative   Not on file   Social Determinants of Health   Financial Resource Strain: Not on file  Food Insecurity: Not on file  Transportation Needs: Not on file  Physical Activity: Not on file  Stress:  Not on file  Social Connections: Not on file  Intimate Partner Violence: Not on file     Review of Systems: General: negative for chills, fever, night sweats or weight changes.  Cardiovascular: negative for chest pain, dyspnea on exertion, edema, orthopnea, palpitations, paroxysmal nocturnal dyspnea or shortness of breath Dermatological: negative for rash Respiratory: negative for cough or wheezing Urologic: negative for hematuria Abdominal: negative for nausea, vomiting, diarrhea, bright red blood per rectum, melena, or hematemesis Neurologic: negative for visual changes, syncope, or dizziness All other systems reviewed and are otherwise negative except as noted above.    Blood pressure 130/62, pulse 76, height 6' (1.829 m), weight 210 lb 9.6 oz (95.5 kg), SpO2 98 %.  General appearance: alert and no distress Neck: no adenopathy, no carotid bruit, no JVD, supple, symmetrical, trachea midline, and thyroid not enlarged, symmetric, no tenderness/mass/nodules Lungs: clear to auscultation bilaterally Heart: irregularly irregular rhythm Extremities: extremities normal, atraumatic, no cyanosis or edema Pulses: 2+ and symmetric Skin: Skin color, texture, turgor normal. No rashes or lesions Neurologic: Grossly normal  EKG atrial fibrillation with a ventricular sponsor 76 and right bundle branch block.  I personally reviewed this EKG.  ASSESSMENT AND PLAN:   Occlusion and stenosis of carotid artery without mention of cerebral infarction History of carotid artery disease status post elective right carotid endarterectomy by Dr. Donnetta [name] many years ago followed by carotid Doppler studies.  Essential hypertension History of essential hypertension blood pressure measured [name] 130/62.  He is on diltiazem and losartan.  Hyperlipidemia History of hyperlipidemia on statin therapy followed by his PCP.  AF (paroxysmal atrial fibrillation) (HCC) History of persistent A. fib rate controlled not on  oral anticoagulant because of age, comorbidities and fall risk  Chest pain of uncertain etiology [name] has complained of new onset exertional chest pain and shortness of breath over the last several weeks.  He has no prior history of CAD.  I am going to get a 2D echo and a coronary CTA to further evaluate.     Robert Harp MD FACP,FACC,FAHA, [facility] [name] 08/01/2021 12:11 PM

## 2021-08-02 LAB — BASIC METABOLIC PANEL
BUN/Creatinine Ratio: 23 (ref 10–24)
BUN: 28 mg/dL — ABNORMAL HIGH (ref 8–27)
CO2: 21 mmol/L (ref 20–29)
Calcium: 9.4 mg/dL (ref 8.6–10.2)
Chloride: 106 mmol/L (ref 96–106)
Creatinine, Ser: 1.23 mg/dL (ref 0.76–1.27)
Glucose: 172 mg/dL — ABNORMAL HIGH (ref 70–99)
Potassium: 4.8 mmol/L (ref 3.5–5.2)
Sodium: 145 mmol/L — ABNORMAL HIGH (ref 134–144)
eGFR: 56 mL/min/{1.73_m2} — ABNORMAL LOW (ref >59)

## 2021-08-03 ENCOUNTER — Telehealth: Payer: Self-pay | Admitting: Cardiovascular Disease

## 2021-08-03 MED ORDER — METOPROLOL TARTRATE 50 MG PO TABS: ORAL_TABLET | ORAL | 0 refills | Status: DC

## 2021-08-03 NOTE — Telephone Encounter (Signed)
Spoke with patient's wife Patient has been ordered cardiac CT Metoprolol tartrate should be 100mg  2 hours prior to CT test per note however quantity of 1 tablet was sent to [facility] Advised will update this with [facility] and send in for 2 - 50mg  tablets = 100mg 

## 2021-08-03 NOTE — Telephone Encounter (Signed)
[name] is calling stating the pharmacy advised her they are unsure of how to advise [name] to take the medication sent over for his CT. She states the pharmacy also advised they have tried to contact our office several times in regards to this since yesterday, but have been unsuccessful. [name] would like a callback in regards to this.

## 2021-08-07 ENCOUNTER — Telehealth (HOSPITAL_COMMUNITY): Payer: Self-pay | Admitting: *Deleted

## 2021-08-07 NOTE — Telephone Encounter (Signed)
Attempted to call patient regarding upcoming cardiac CT appointment. °Left message on voicemail with name and callback number ° °Akshara Blumenthal RN Navigator Cardiac Imaging °[facility] °[phone] Office °[phone] Cell ° °

## 2021-08-07 NOTE — Telephone Encounter (Signed)
Patient returning call regarding upcoming cardiac imaging study; pt verbalizes understanding of appt date/time, parking situation and where to check in, pre-test NPO status and medications ordered, and verified current allergies; name and call back number provided for further questions should they arise  Maripaz Mullan RN Navigator Cardiac Imaging [facility] [phone] office [phone] cell  Patient to take 100mg metoprolol tartrate two hours prior to cardiac CT scan. 

## 2021-08-08 ENCOUNTER — Ambulatory Visit (HOSPITAL_COMMUNITY)
Admission: RE | Admit: 2021-08-08 | Discharge: 2021-08-08 | Disposition: A | Discharge status: Home / Self Care | Payer: Medicare PPO | Source: Ambulatory Visit | Attending: Internal Medicine | Admitting: Internal Medicine

## 2021-08-08 ENCOUNTER — Other Ambulatory Visit: Payer: Self-pay | Admitting: Internal Medicine

## 2021-08-08 ENCOUNTER — Ambulatory Visit (HOSPITAL_COMMUNITY)
Admission: RE | Admit: 2021-08-08 | Discharge: 2021-08-08 | Disposition: A | Discharge status: Home / Self Care | Payer: Medicare PPO | Source: Ambulatory Visit | Attending: Cardiovascular Disease | Admitting: Cardiovascular Disease

## 2021-08-08 ENCOUNTER — Other Ambulatory Visit: Payer: Self-pay

## 2021-08-08 DIAGNOSIS — R931 Abnormal findings on diagnostic imaging of heart and coronary circulation: Secondary | ICD-10-CM

## 2021-08-08 DIAGNOSIS — I200 Unstable angina: Secondary | ICD-10-CM | POA: Diagnosis not present

## 2021-08-08 DIAGNOSIS — I2510 Atherosclerotic heart disease of native coronary artery without angina pectoris: Secondary | ICD-10-CM | POA: Diagnosis not present

## 2021-08-08 MED ORDER — NITROGLYCERIN 0.4 MG SL SUBL: 0.8000 mg | SUBLINGUAL_TABLET | Freq: Once | SUBLINGUAL | Status: AC

## 2021-08-08 MED ORDER — IOHEXOL 350 MG/ML SOLN
100.0000 mL | Freq: Once | INTRAVENOUS | Status: AC | PRN
  Administered 2021-08-08: 95 mL via INTRAVENOUS

## 2021-08-08 MED ORDER — NITROGLYCERIN 0.4 MG SL SUBL
SUBLINGUAL_TABLET | SUBLINGUAL | Status: AC
  Administered 2021-08-08: 0.8 mg via SUBLINGUAL
  Filled 2021-08-08: qty 2

## 2021-08-08 NOTE — Progress Notes (Signed)
Please send CT for FFR - Dr. Shakelia Scrivner 

## 2021-08-09 ENCOUNTER — Ambulatory Visit (INDEPENDENT_AMBULATORY_CARE_PROVIDER_SITE_OTHER): Payer: Medicare PPO

## 2021-08-09 DIAGNOSIS — I480 Paroxysmal atrial fibrillation: Secondary | ICD-10-CM

## 2021-08-09 DIAGNOSIS — I10 Essential (primary) hypertension: Secondary | ICD-10-CM | POA: Diagnosis not present

## 2021-08-09 DIAGNOSIS — I200 Unstable angina: Secondary | ICD-10-CM

## 2021-08-09 DIAGNOSIS — R079 Chest pain, unspecified: Secondary | ICD-10-CM

## 2021-08-09 LAB — ECHOCARDIOGRAM COMPLETE
AR max vel: 1.94 cm2
AV Area VTI: 2.21 cm2
AV Area mean vel: 1.92 cm2
AV Mean grad: 5 mmHg
AV Peak grad: 10 mmHg
AV Vena cont: 0.42 cm
Ao pk vel: 1.58 m/s
Area-P 1/2: 4.46 cm2
Calc EF: 64.6 %
MV M vel: 6.26 m/s
MV Peak grad: 156.5 mmHg
MV VTI: 1.96 cm2
P 1/2 time: 424 msec
S' Lateral: 2.81 cm
Single Plane A2C EF: 60.1 %
Single Plane A4C EF: 68.1 %

## 2021-08-15 ENCOUNTER — Other Ambulatory Visit: Payer: Self-pay

## 2021-08-15 ENCOUNTER — Encounter: Payer: Self-pay | Admitting: Cardiovascular Disease

## 2021-08-15 ENCOUNTER — Ambulatory Visit: Payer: Medicare PPO | Admitting: Cardiovascular Disease

## 2021-08-15 VITALS — BP 124/58 | HR 60 | Ht 72.0 in | Wt 212.0 lb

## 2021-08-15 DIAGNOSIS — R079 Chest pain, unspecified: Secondary | ICD-10-CM | POA: Diagnosis not present

## 2021-08-15 DIAGNOSIS — E782 Mixed hyperlipidemia: Secondary | ICD-10-CM

## 2021-08-15 DIAGNOSIS — I480 Paroxysmal atrial fibrillation: Secondary | ICD-10-CM | POA: Diagnosis not present

## 2021-08-15 IMAGING — US ULTRASOUND ABDOMEN LIMITED
1 series · 14 of 25 positions shown · non-contrast
Comparison: None.

CLINICAL DATA: Right upper quadrant pain for 2 days.

EXAM:
ULTRASOUND ABDOMEN LIMITED RIGHT UPPER QUADRANT

[Series 1: ultrasound abdomen limited · 14 of 62 slices shown]
[im 1/62]
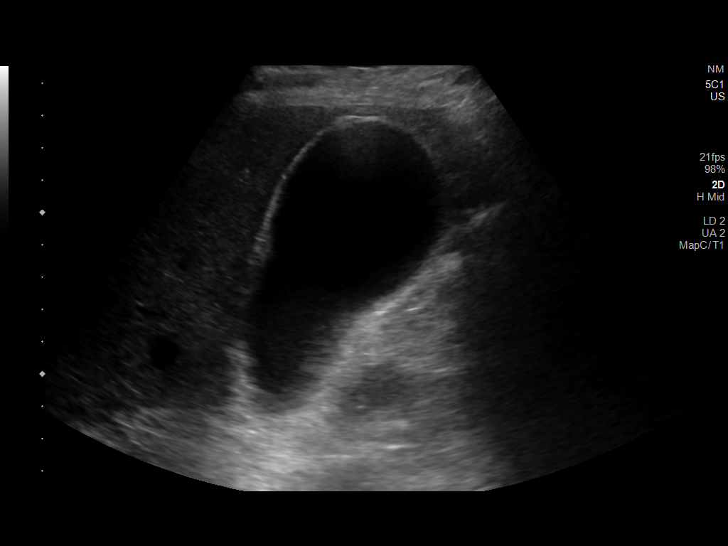
[im 6/62]
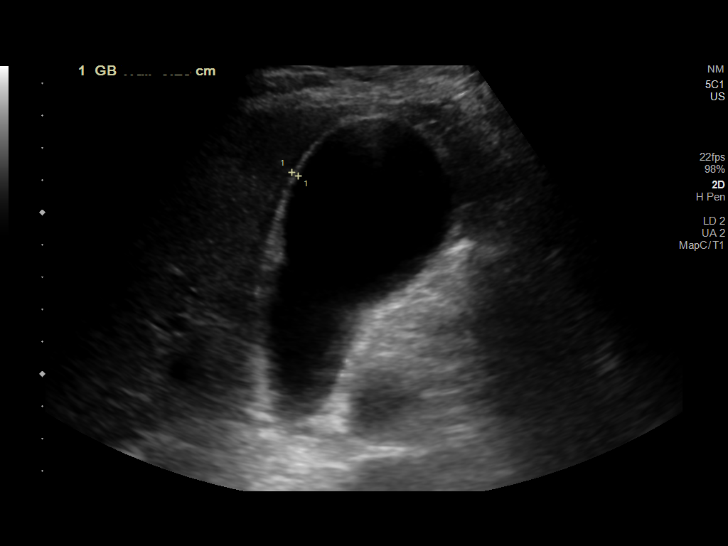
[im 11/62]
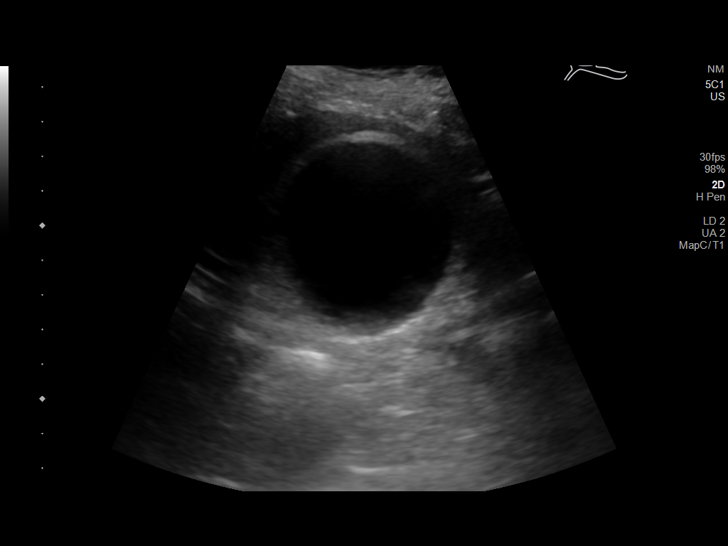
[im 16/62]
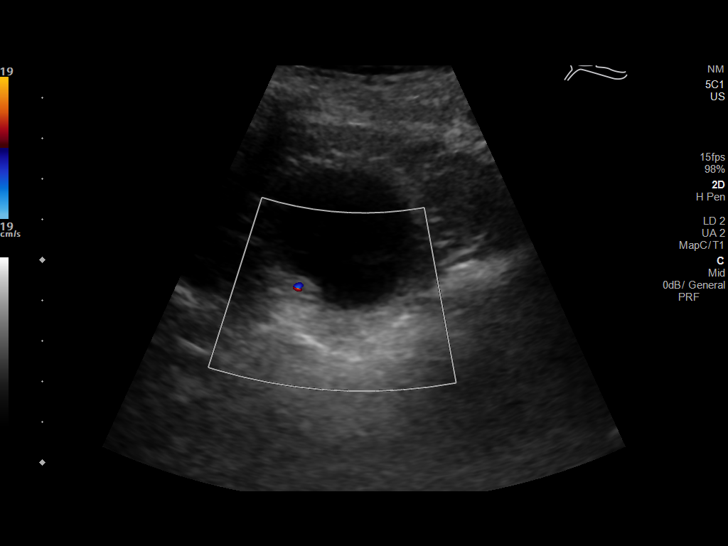
[im 21/62]
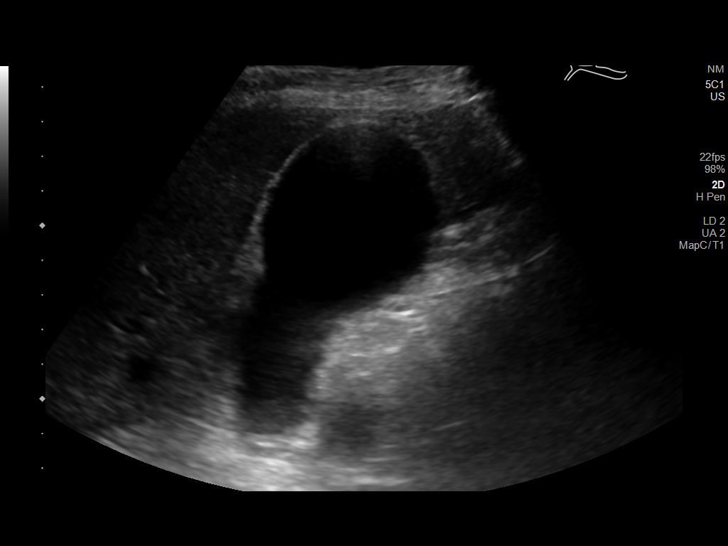
[im 23/62]
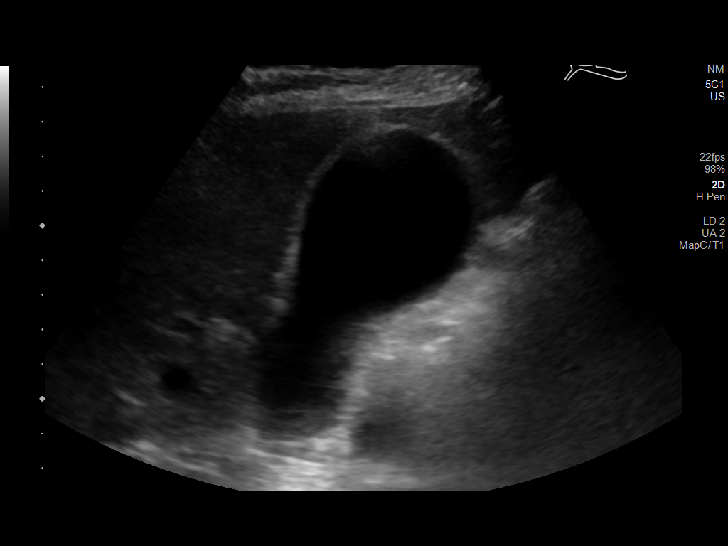
[im 28/62]
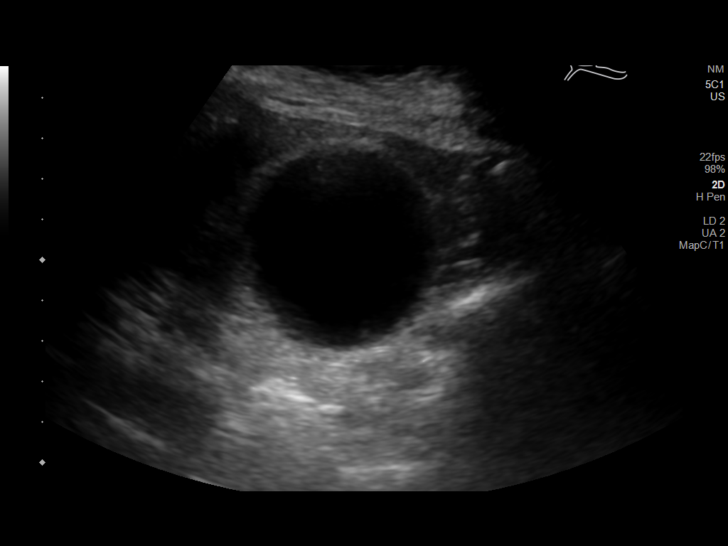
[im 34/62]
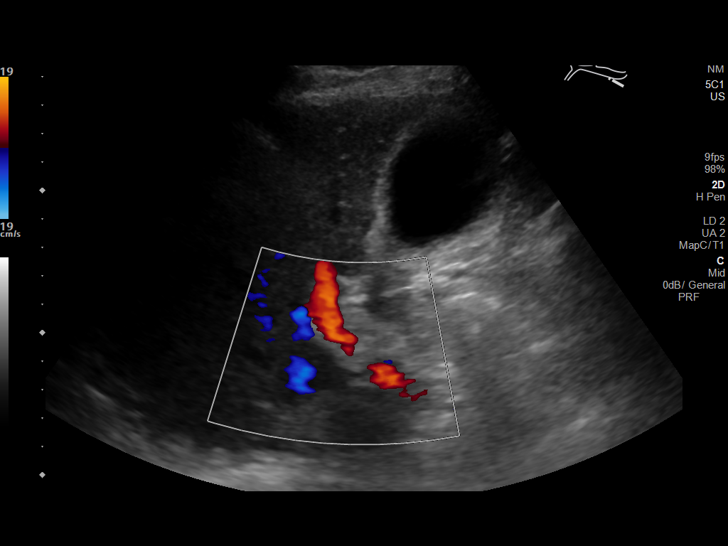
[im 39/62]
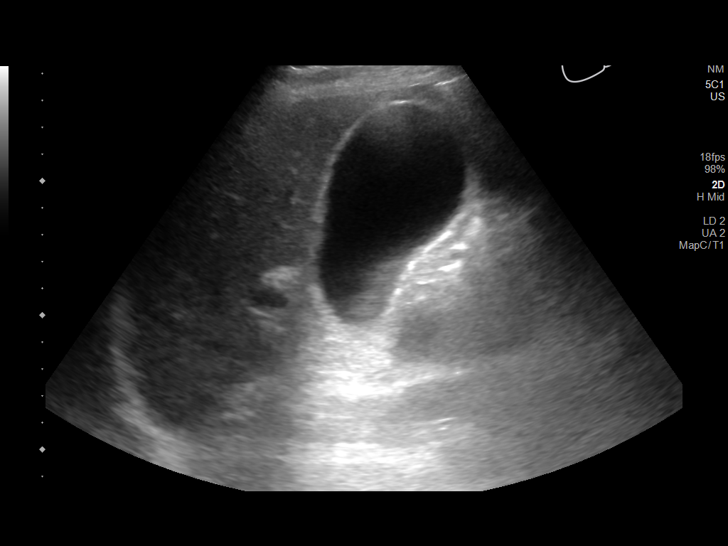
[im 41/62]
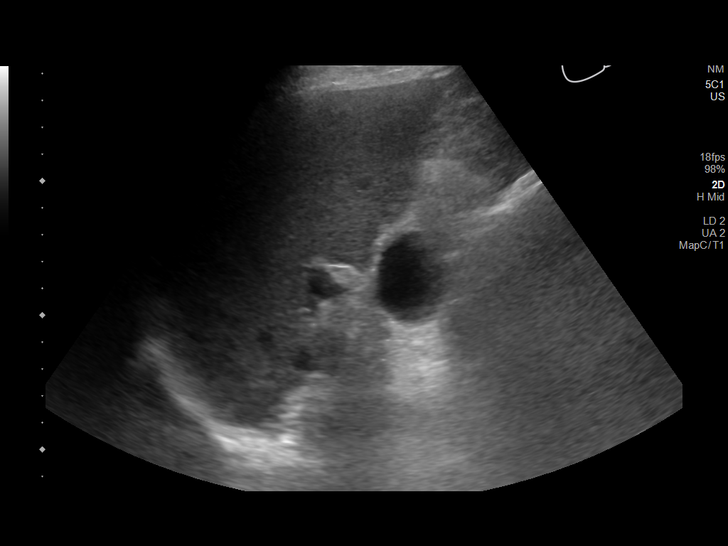
[im 46/62]
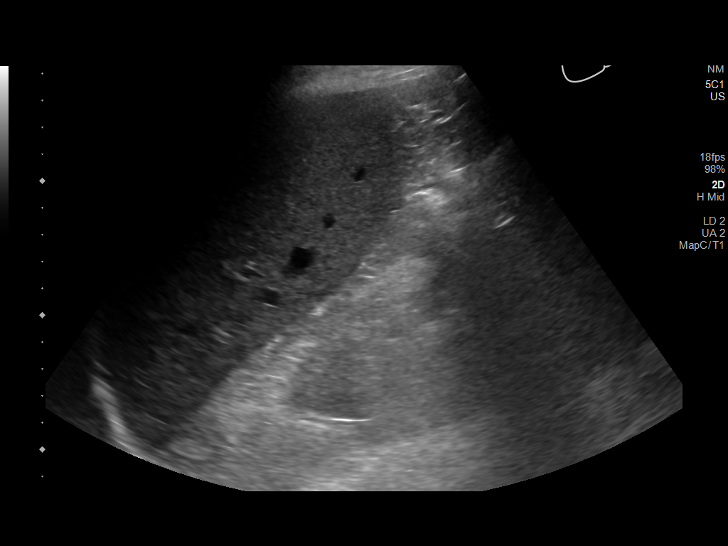
[im 51/62]
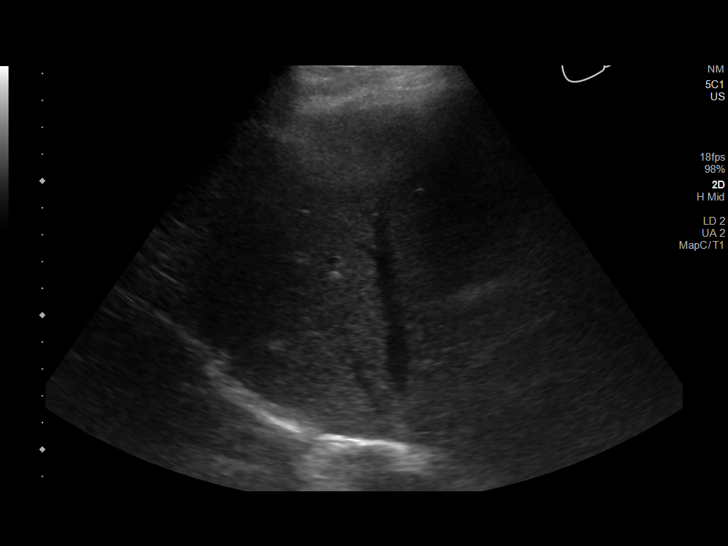
[im 56/62]
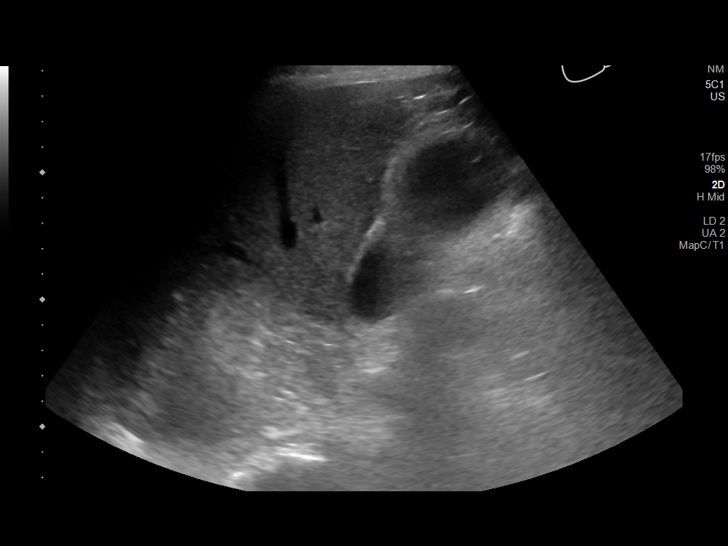
[im 62/62]
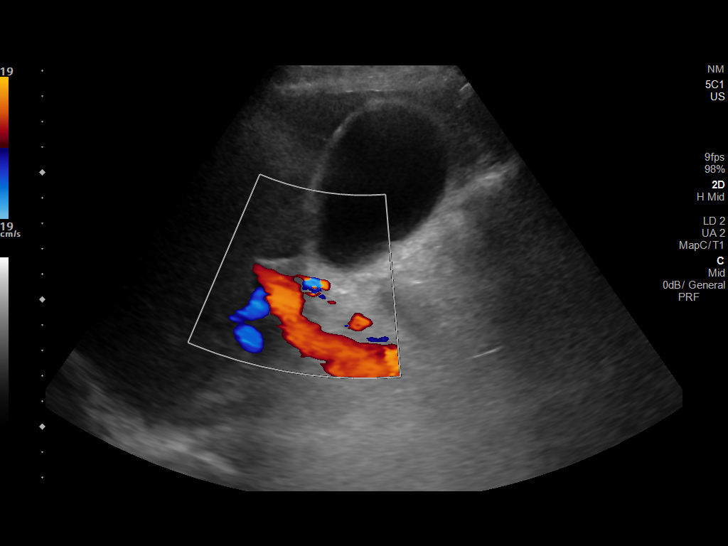

[14 of 25 positions shown; findings below may reference images not displayed]

FINDINGS: Gallbladder:

A tiny mobile gallstone is seen measuring approximately 5 mm. No
evidence of gallbladder wall thickening or pericholecystic fluid. No
sonographic Murphy sign noted by sonographer.

Common bile duct:

Diameter: 2 mm, within normal limits.

Liver:

No focal lesion identified. Within normal limits in parenchymal
echogenicity. Portal vein is patent on color Doppler imaging with
normal direction of blood flow towards the liver.

Other: None.
IMPRESSION: Tiny gallstone. No sonographic evidence of cholecystitis or biliary
ductal dilatation.

## 2021-08-15 MED ORDER — ISOSORBIDE MONONITRATE ER 30 MG PO TB24: 30.0000 mg | ORAL_TABLET | Freq: Every day | ORAL | 1 refills | Status: DC

## 2021-08-15 NOTE — Progress Notes (Signed)
Mr. [name] returns for follow-up visit of his outpatient diagnostic test performed because of chest pain and dyspnea.  He really no longer has dyspnea.  A 2D echo showed normal LV systolic function with normal valvular function.  A coronary CTA however showed a coronary calcium score of 1207 with multivessel disease however the tightest lesion was in the origin of an obtuse marginal branch.  I did put him on Imdur 15 mg a day when I saw him last.  I am going to increase this to 30 mg a day.  We talked about strategy of treatment, invasive versus noninvasive.  Given his age I prefer to treat him medically for now and if he is recalcitrant to medical therapy we will entertain cardiac catheterization.  Twelve-lead EKG today shows atrial fibrillation with a ventricular sponsor of 60 and right bundle branch block.  I will see him back in 3 months.  [name], M.D., [place], [facility], [place] [address] [place], [place]  [address]  [phone] 08/15/2021 12:12 PM

## 2021-08-15 NOTE — Patient Instructions (Signed)
Medication Instructions:   -Increase isosorbide mononitrate (imdur) to 30mg  once daily.  *If you need a refill on your cardiac medications before your next appointment, please call your pharmacy*   Follow-Up: At [facility], you and your health needs are our priority.  As part of our continuing mission to provide you with exceptional heart care, we have created designated Provider Care Teams.  These Care Teams include your primary Cardiologist (physician) and Advanced Practice Providers (APPs -  Physician Assistants and Nurse Practitioners) who all work together to provide you with the care you need, when you need it.  We recommend signing up for the patient portal called "MyChart".  Sign up information is provided on this After Visit Summary.  MyChart is used to connect with patients for Virtual Visits (Telemedicine).  Patients are able to view lab/test results, encounter notes, upcoming appointments, etc.  Non-urgent messages can be sent to your provider as well.   To learn more about what you can do with MyChart, go to [email or web address].    Your next appointment:   3 month(s)  The format for your next appointment:   In Person  Provider:   Quay Burow, MD

## 2021-08-17 IMAGING — MR MR ABDOMEN WO/W CM MRCP
18 of 20 series · 45 of 48 positions shown · IV contrast (gadavist)
Comparison: Abdomen CT on 06/06/2019

CLINICAL DATA: Abdominal pain. Pancreatic lesion seen on recent
abdomen CTA.

EXAM:
MRI ABDOMEN WITHOUT AND WITH CONTRAST (INCLUDING MRCP)
TECHNIQUE: Multiplanar multisequence MR imaging of the abdomen was performed
both before and after the administration of intravenous contrast.
Heavily T2-weighted images of the biliary and pancreatic ducts were
obtained, and three-dimensional MRCP images were rendered by post
processing.
CONTRAST:  9 mL Gadavist

[Series 3: bSSFP · coronal · 6.0mm · 0.74mm/px · 1 of 35 slices shown]
[im 1/35]
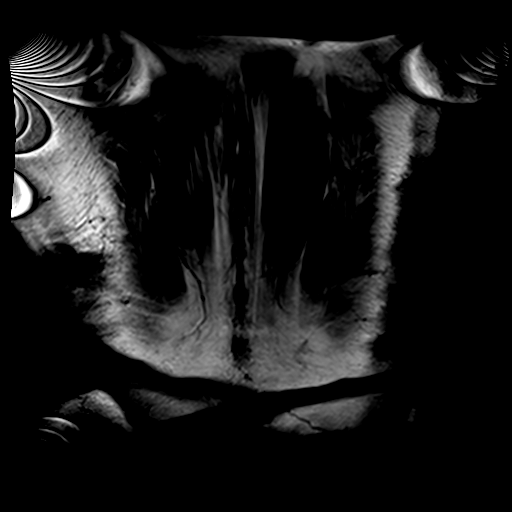

[Series 4: ax haste · axial · 6.0mm · 1.19mm/px · 1 of 35 slices shown]
[im 1/35]
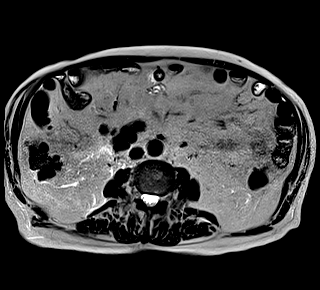

[Series 5: T2 fat-sat · axial · 6.0mm · 1.19mm/px · 1 of 35 slices shown]
[im 1/35]
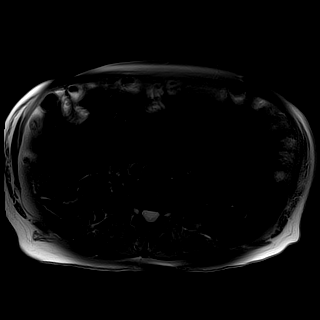

[Series 6: DWI · axial · 6.0mm · 1.42mm/px · z∈[-163,+82]mm · 3 of 105 slices shown (1 of 2)]
[im 1/105]
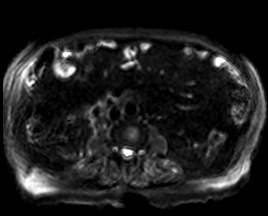
[im 53/105]
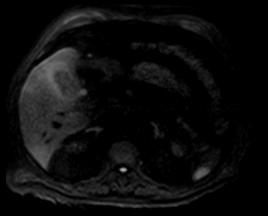
[im 105/105]
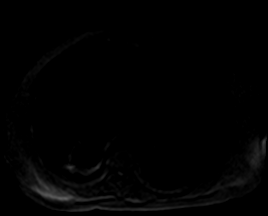

[Series 7: DWI · axial · 6.0mm · 1.42mm/px · 1 of 35 slices shown (2 of 2)]
[im 1/35]
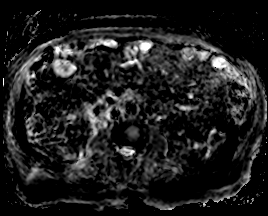

[Series 10: MRCP · coronal · 4.0mm · 1.12mm/px · 1 of 19 slices shown]
[im 1/19]
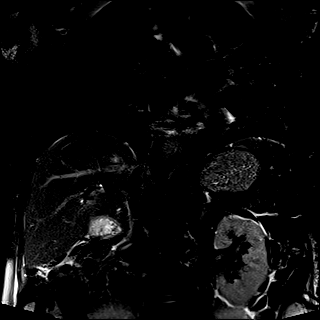

[Series 11: radials · coronal · 50.0mm · 0.78mm/px · 1 of 5 slices shown]
[im 1/5]
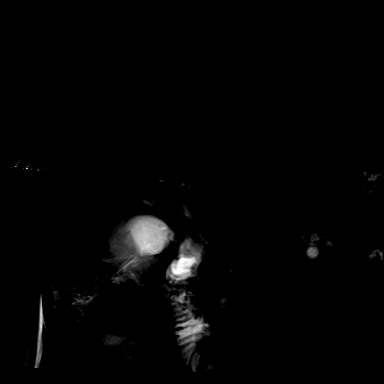

[Series 12: ax in and · axial · 3.0mm · 1.19mm/px · z∈[-161,+76]mm · 6 of 160 slices shown]
[im 1/160]
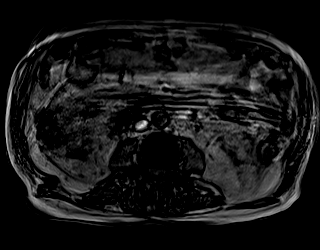
[im 32/160]
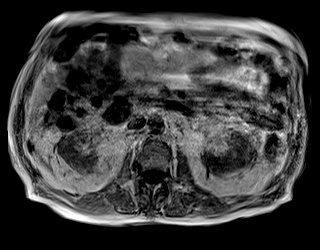
[im 64/160]
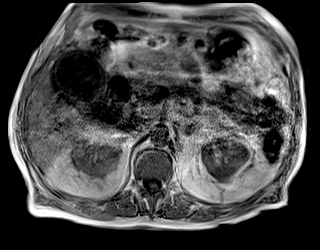
[im 96/160]
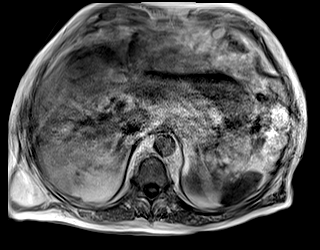
[im 128/160]
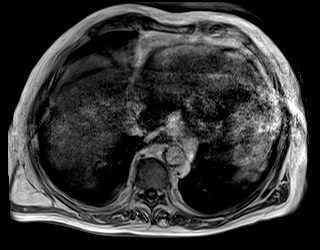
[im 160/160]
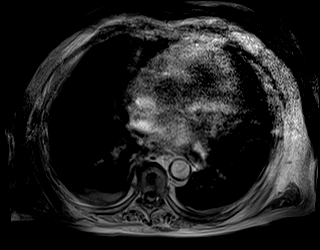

[Series 13: T1 dynamic · axial · non-contrast · 3.0mm · 1.19mm/px · z∈[-161,+76]mm · 3 of 80 slices shown (1 of 5)]
[im 1/80]
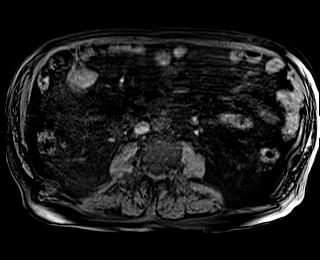
[im 40/80]
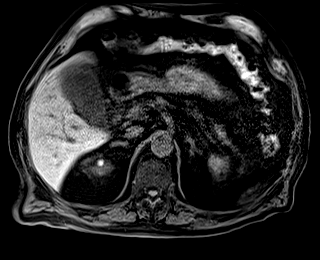
[im 80/80]
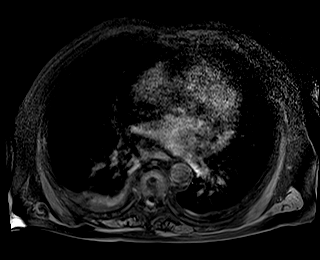

[Series 15: T1 dynamic post-contrast · axial · 3.0mm · 1.19mm/px · z∈[-161,+76]mm · 3 of 80 slices shown (1 of 5)]
[im 1/80]
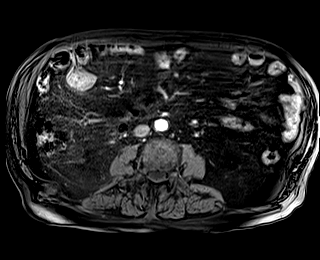
[im 40/80]
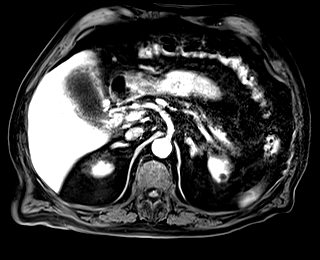
[im 80/80]
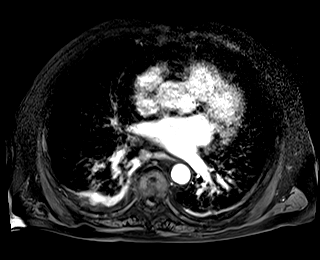

[Series 16: T1 dynamic · axial · 3.0mm · 1.19mm/px · z∈[-161,+76]mm · 3 of 80 slices shown (2 of 5)]
[im 1/80]
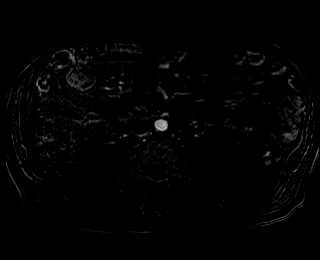
[im 40/80]
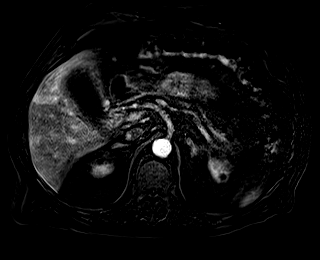
[im 80/80]
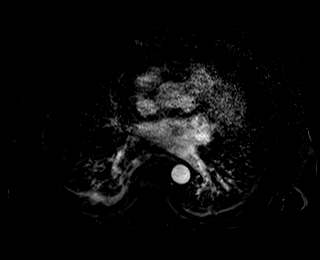

[Series 17: T1 dynamic post-contrast · axial · 3.0mm · 1.19mm/px · z∈[-161,+76]mm · 3 of 80 slices shown (2 of 5)]
[im 1/80]
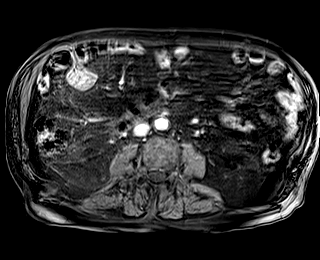
[im 40/80]
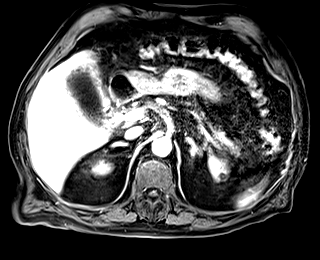
[im 80/80]
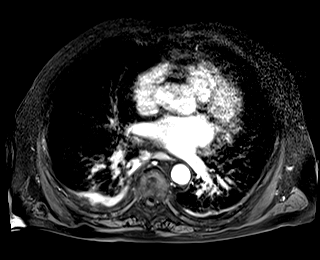

[Series 18: T1 dynamic · axial · 3.0mm · 1.19mm/px · z∈[-161,+76]mm · 3 of 80 slices shown (3 of 5)]
[im 1/80]
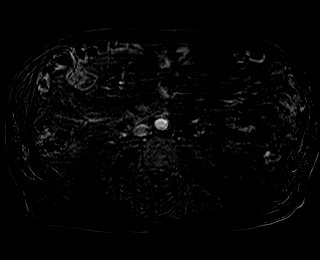
[im 40/80]
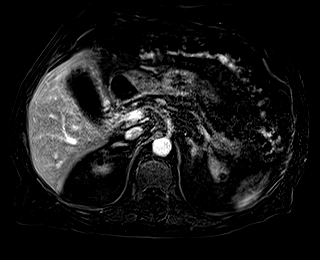
[im 80/80]
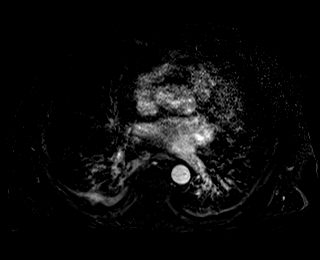

[Series 19: T1 dynamic post-contrast · axial · 3.0mm · 1.19mm/px · z∈[-161,+76]mm · 3 of 80 slices shown (3 of 5)]
[im 1/80]
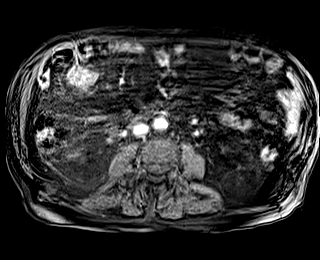
[im 40/80]
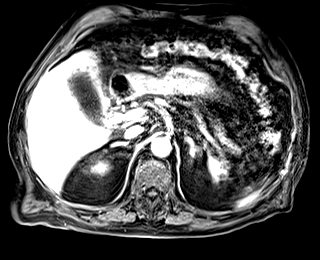
[im 80/80]
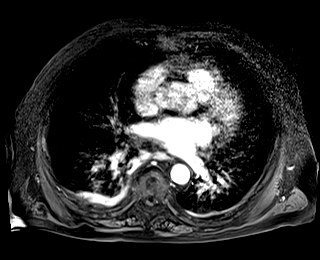

[Series 20: T1 dynamic · axial · 3.0mm · 1.19mm/px · z∈[-161,+76]mm · 3 of 80 slices shown (4 of 5)]
[im 1/80]
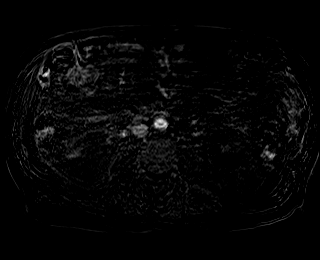
[im 40/80]
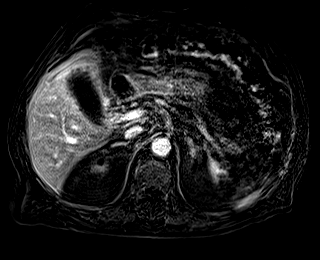
[im 80/80]
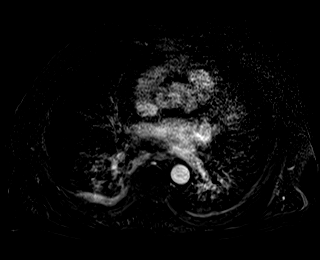

[Series 21: T1 dynamic post-contrast · axial · 3.0mm · 1.19mm/px · z∈[-161,+76]mm · 3 of 80 slices shown (4 of 5)]
[im 1/80]
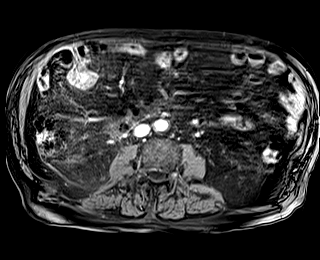
[im 40/80]
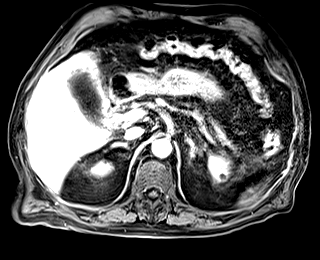
[im 80/80]
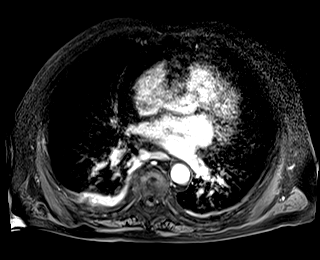

[Series 22: T1 dynamic · axial · 3.0mm · 1.19mm/px · z∈[-161,+76]mm · 3 of 80 slices shown (5 of 5)]
[im 1/80]
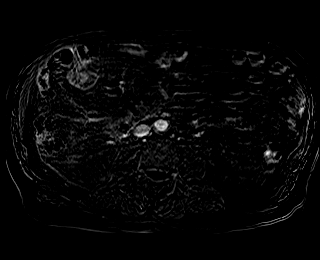
[im 40/80]
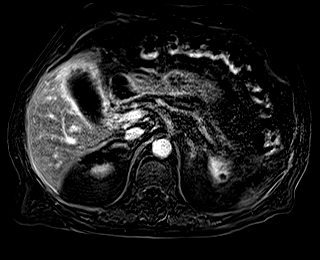
[im 80/80]
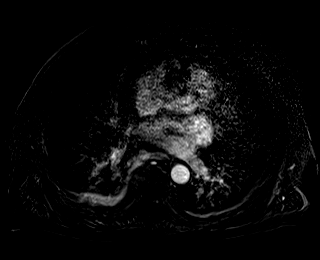

[Series 23: T1 dynamic post-contrast · coronal · 3.0mm · 1.31mm/px · 3 of 72 slices shown (5 of 5)]
[im 1/72]
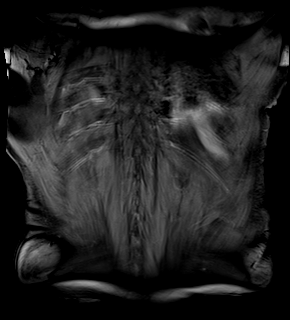
[im 36/72]
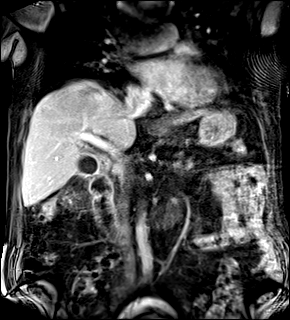
[im 72/72]
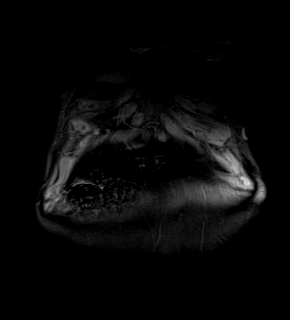

[45 of 48 positions shown; findings below may reference images not displayed]

FINDINGS: Lower chest: Mild bibasilar atelectasis and tiny right pleural
effusion.

Hepatobiliary: No hepatic masses identified. No evidence of
steatosis. The gallbladder is distended with a small gallstone seen
in the neck. Diffuse gallbladder wall thickening and contrast
enhancement are seen, consistent with acute cholecystitis. No
evidence biliary ductal dilatation or choledocholithiasis.

Pancreas: A multiloculated cystic lesion is seen in pancreatic tail
which measures 2.3 cm, and shows a small solid areas centrally which
demonstrates contrast enhancement. In addition, there are other
scattered tiny sub-cm cysts in the pancreatic body and tail. No
evidence of main pancreatic ductal dilatation or pancreas divisum.

Spleen:  Within normal limits in size and appearance.

Adrenals/Urinary Tract: No masses identified. A few tiny renal cysts
are noted. No evidence of hydronephrosis.

Stomach/Bowel: Diffuse colonic diverticulosis is seen, however there
is no evidence of diverticulitis involving visualized portion of
colon.

Vascular/Lymphatic: No pathologically enlarged lymph nodes
identified. No abdominal aortic aneurysm.

Other:  None.

Musculoskeletal:  No suspicious bone lesions identified.
IMPRESSION: Positive for acute cholecystitis. No evidence of biliary ductal
dilatation or choledocholithiasis.

2.3 cm complex cystic lesion in pancreatic tail with enhancing
central nodule, consistent with cystic pancreatic neoplasm. Other
tiny less than 1 cm cystic foci are also noted within the pancreatic
body and tail. Recommend further evaluation with EUS/FNA.

Bibasilar atelectasis and tiny right pleural effusion.

Colonic diverticulosis.

These results will be called to the ordering clinician or
representative by the Radiologist Assistant, and communication
documented in the PACS or zVision Dashboard.

## 2021-09-24 DIAGNOSIS — E782 Mixed hyperlipidemia: Secondary | ICD-10-CM | POA: Diagnosis not present

## 2021-09-24 DIAGNOSIS — Z23 Encounter for immunization: Secondary | ICD-10-CM | POA: Diagnosis not present

## 2021-09-24 DIAGNOSIS — I10 Essential (primary) hypertension: Secondary | ICD-10-CM | POA: Diagnosis not present

## 2021-09-24 DIAGNOSIS — Z0000 Encounter for general adult medical examination without abnormal findings: Secondary | ICD-10-CM | POA: Diagnosis not present

## 2021-09-24 DIAGNOSIS — I700 Atherosclerosis of aorta: Secondary | ICD-10-CM | POA: Diagnosis not present

## 2021-09-24 DIAGNOSIS — I679 Cerebrovascular disease, unspecified: Secondary | ICD-10-CM | POA: Diagnosis not present

## 2021-09-24 DIAGNOSIS — M1710 Unilateral primary osteoarthritis, unspecified knee: Secondary | ICD-10-CM | POA: Diagnosis not present

## 2021-09-24 DIAGNOSIS — G629 Polyneuropathy, unspecified: Secondary | ICD-10-CM | POA: Diagnosis not present

## 2021-09-24 DIAGNOSIS — E1142 Type 2 diabetes mellitus with diabetic polyneuropathy: Secondary | ICD-10-CM | POA: Diagnosis not present

## 2021-09-26 DIAGNOSIS — U071 COVID-19: Secondary | ICD-10-CM | POA: Diagnosis not present

## 2021-10-27 ENCOUNTER — Emergency Department (HOSPITAL_COMMUNITY)
Admission: EM | Admit: 2021-10-27 | Discharge: 2021-10-27 | Disposition: A | Discharge status: Home / Self Care | Payer: Medicare PPO | Attending: Emergency Medicine | Admitting: Emergency Medicine

## 2021-10-27 ENCOUNTER — Other Ambulatory Visit: Payer: Self-pay

## 2021-10-27 DIAGNOSIS — R404 Transient alteration of awareness: Secondary | ICD-10-CM | POA: Diagnosis not present

## 2021-10-27 DIAGNOSIS — I10 Essential (primary) hypertension: Secondary | ICD-10-CM | POA: Diagnosis not present

## 2021-10-27 DIAGNOSIS — R55 Syncope and collapse: Secondary | ICD-10-CM | POA: Diagnosis not present

## 2021-10-27 DIAGNOSIS — E11649 Type 2 diabetes mellitus with hypoglycemia without coma: Secondary | ICD-10-CM | POA: Insufficient documentation

## 2021-10-27 DIAGNOSIS — E162 Hypoglycemia, unspecified: Secondary | ICD-10-CM

## 2021-10-27 DIAGNOSIS — T50904A Poisoning by unspecified drugs, medicaments and biological substances, undetermined, initial encounter: Secondary | ICD-10-CM | POA: Diagnosis not present

## 2021-10-27 DIAGNOSIS — E161 Other hypoglycemia: Secondary | ICD-10-CM | POA: Diagnosis not present

## 2021-10-27 DIAGNOSIS — T887XXA Unspecified adverse effect of drug or medicament, initial encounter: Secondary | ICD-10-CM | POA: Diagnosis not present

## 2021-10-27 LAB — CBC WITH DIFFERENTIAL/PLATELET
Abs Immature Granulocytes: 0.05 10*3/uL (ref 0.00–0.07)
Basophils Absolute: 0 10*3/uL (ref 0.0–0.1)
Basophils Relative: 0 %
Eosinophils Absolute: 0.1 10*3/uL (ref 0.0–0.5)
Eosinophils Relative: 1 %
HCT: 36 % — ABNORMAL LOW (ref 39.0–52.0)
Hemoglobin: 11.5 g/dL — ABNORMAL LOW (ref 13.0–17.0)
Immature Granulocytes: 1 %
Lymphocytes Relative: 11 %
Lymphs Abs: 1.1 10*3/uL (ref 0.7–4.0)
MCH: 30.7 pg (ref 26.0–34.0)
MCHC: 31.9 g/dL (ref 30.0–36.0)
MCV: 96.3 fL (ref 80.0–100.0)
Monocytes Absolute: 0.8 10*3/uL (ref 0.1–1.0)
Monocytes Relative: 8 %
Neutro Abs: 7.5 10*3/uL (ref 1.7–7.7)
Neutrophils Relative %: 79 %
Platelets: 173 10*3/uL (ref 150–400)
RBC: 3.74 MIL/uL — ABNORMAL LOW (ref 4.22–5.81)
RDW: 15.6 % — ABNORMAL HIGH (ref 11.5–15.5)
WBC: 9.5 10*3/uL (ref 4.0–10.5)
nRBC: 0 % (ref 0.0–0.2)

## 2021-10-27 LAB — COMPREHENSIVE METABOLIC PANEL
ALT: 19 U/L (ref 0–44)
AST: 22 U/L (ref 15–41)
Albumin: 3.6 g/dL (ref 3.5–5.0)
Alkaline Phosphatase: 95 U/L (ref 38–126)
Anion gap: 9 (ref 5–15)
BUN: 29 mg/dL — ABNORMAL HIGH (ref 8–23)
CO2: 25 mmol/L (ref 22–32)
Calcium: 9 mg/dL (ref 8.9–10.3)
Chloride: 104 mmol/L (ref 98–111)
Creatinine, Ser: 1.42 mg/dL — ABNORMAL HIGH (ref 0.61–1.24)
GFR, Estimated: 47 mL/min — ABNORMAL LOW (ref >60)
Glucose, Bld: 97 mg/dL (ref 70–99)
Potassium: 3.5 mmol/L (ref 3.5–5.1)
Sodium: 138 mmol/L (ref 135–145)
Total Bilirubin: 0.7 mg/dL (ref 0.3–1.2)
Total Protein: 7 g/dL (ref 6.5–8.1)

## 2021-10-27 LAB — CBG MONITORING, ED
Glucose-Capillary: 85 mg/dL (ref 70–99)
Glucose-Capillary: 125 mg/dL — ABNORMAL HIGH (ref 70–99)
Glucose-Capillary: 226 mg/dL — ABNORMAL HIGH (ref 70–99)

## 2021-10-27 LAB — TROPONIN I (HIGH SENSITIVITY)
Troponin I (High Sensitivity): 20 ng/L — ABNORMAL HIGH (ref <18)
Troponin I (High Sensitivity): 19 ng/L — ABNORMAL HIGH (ref <18)

## 2021-10-27 MED ORDER — SODIUM CHLORIDE 0.9 % IV BOLUS
500.0000 mL | Freq: Once | INTRAVENOUS | Status: AC
Start: 2021-10-27 — End: 2021-10-27
  Administered 2021-10-27: 500 mL via INTRAVENOUS

## 2021-10-27 NOTE — Discharge Instructions (Signed)
You were seen in the emergency department today with lightheadedness.  Your blood sugar was low and I suspect this was from you taking your insulin without eating soon after.  Please be sure that once you take your morning insulin you have a plan for eating.  Please call your primary care doctor to review your ED visit and make any changes to your home medications as needed.  Return with any new or suddenly worsening symptoms.

## 2021-10-27 NOTE — ED Notes (Signed)
EDP at bedside  

## 2021-10-27 NOTE — ED Triage Notes (Signed)
Pt CBG this morning was 230 around 630am. Pt took 50 units of insulin and around 9am began feeling weak and "not himself" EMS arrived at 10am pt CBG was 42 and pt was unable to follow commands. Pt has had 20g D10 and last CBG at 1055 was 133. Pt alert and oriented denies x4 no complaints at this time.

## 2021-10-27 NOTE — ED Provider Notes (Signed)
Emergency Department Provider Note   I have reviewed the triage vital signs and the nursing notes.   HISTORY  Chief Complaint Hypoglycemia   HPI [name] is a 88 y.o. male with past medical history reviewed below presents emergency department with fatigue and somnolence this morning after taking his insulin.  The patient's daughter is at bedside and other daughter is available by phone.  They report that the patient took 50 units of Levemir, which is apparently a fairly typical dose for him, this morning at around 6:30 AM.  He normally comes out and eats breakfast right away but today there was a new nurse tech at the house and a morning routine was delayed.  The daughter came over to fix breakfast at around 9 AM but noticed that he was much more sleepy and felt like he almost passed out.  Patient recalls feeling fairly well until he had an abrupt loss of energy.  He did not experience chest pain, palpitations, shortness of breath.  No fever or upper respiratory infection symptoms. Denies abdominal pain or HA.   Past Medical History:  Diagnosis Date   Anemia    related to frequent nosebleeds-right nostril.   Aortic insufficiency    a. mild by echo 2013.   Arthritis    arthritis -back knees, most joints   Atrial flutter ([place])    a. per report in 2013.   Breathing difficulty 11-10-12   difficult to breath if laying posteriorly, right nare difficulty   Carotid artery disease ([place])    a. Remote R CEA in the 1990s per pt.   Change in hearing    Diabetes mellitus age 62   Dysrhythmia 07/2012   hx. Atrial Flutter x1, converted spontaneously-no ploblems.    H/O hiatal hernia    HOH (hard of hearing) 11-10-12   bilaterally   Hyperlipidemia    Hypertension    Ulcer 1995   bleeding ulcer, none since    Review of Systems  Constitutional: No fever/chills. Positive fatigue.  Eyes: No visual changes. ENT: No sore throat. Cardiovascular: Denies chest pain. Respiratory: Denies  shortness of breath. Gastrointestinal: No abdominal pain.  No nausea, no vomiting.  No diarrhea.  No constipation. Genitourinary: Negative for dysuria. Musculoskeletal: Negative for back pain. Skin: Negative for rash. Neurological: Negative for headaches, focal weakness or numbness.  10-point ROS otherwise negative.  ____________________________________________   PHYSICAL EXAM:  VITAL SIGNS: ED Triage Vitals  Enc Vitals Group     BP 10/27/21 1113 (!) 99/46     Pulse Rate 10/27/21 1113 (!) 48     Resp 10/27/21 1113 16     Temp --      Temp src --      SpO2 10/27/21 1113 98 %     Weight 10/27/21 1115 210 lb (95.3 kg)     Height 10/27/21 1115 6' (1.829 m)   Constitutional: Alert and oriented. Well appearing and in no acute distress. Hard of hearing (baseline).  Eyes: Conjunctivae are normal.  Head: Atraumatic. Nose: No congestion/rhinnorhea. Mouth/Throat: Mucous membranes are moist. Neck: No stridor.  Cardiovascular: Normal rate, regular rhythm. Good peripheral circulation. Grossly normal heart sounds.   Respiratory: Normal respiratory effort.  No retractions. Lungs CTAB. Gastrointestinal: Soft and nontender. No distention.  Musculoskeletal: No lower extremity tenderness nor edema. No gross deformities of extremities. Neurologic:  Normal speech and language. No gross focal neurologic deficits are appreciated.  Skin:  Skin is warm, dry and intact. No rash noted.  ____________________________________________   LABS (all labs ordered are listed, but only abnormal results are displayed)  Labs Reviewed  COMPREHENSIVE METABOLIC PANEL - Abnormal; Notable for the following components:      Result Value   BUN 29 (*)    Creatinine, Ser 1.42 (*)    GFR, Estimated 47 (*)    All other components within normal limits  CBC WITH DIFFERENTIAL/PLATELET - Abnormal; Notable for the following components:   RBC 3.74 (*)    Hemoglobin 11.5 (*)    HCT 36.0 (*)    RDW 15.6 (*)    All  other components within normal limits  CBG MONITORING, ED - Abnormal; Notable for the following components:   Glucose-Capillary 125 (*)    All other components within normal limits  CBG MONITORING, ED - Abnormal; Notable for the following components:   Glucose-Capillary 226 (*)    All other components within normal limits  TROPONIN I (HIGH SENSITIVITY) - Abnormal; Notable for the following components:   Troponin I (High Sensitivity) 20 (*)    All other components within normal limits  TROPONIN I (HIGH SENSITIVITY) - Abnormal; Notable for the following components:   Troponin I (High Sensitivity) 19 (*)    All other components within normal limits  CBG MONITORING, ED  CBG MONITORING, ED   ____________________________________________  EKG   EKG Interpretation  Date/Time:  Saturday October 27 2021 12:27:21 EST Ventricular Rate:  66 PR Interval:    QRS Duration: 144 QT Interval:  485 QTC Calculation: 509 R Axis:   16 Text Interpretation: Atrial fibrillation Right bundle branch block Confirmed by Nanda Quinton [phone]) on 10/27/2021 1:01:35 PM        ____________________________________________   PROCEDURES  Procedure(s) performed:   Procedures  None  ____________________________________________   INITIAL IMPRESSION / ASSESSMENT AND PLAN / ED COURSE  Pertinent labs & imaging results that were available during my care of the patient were reviewed by me and considered in my medical decision making (see chart for details).   This patient is Presenting for Evaluation of near syncope, which does require a range of treatment options, and is a complaint that involves a high risk of morbidity and mortality.  The Differential Diagnoses include cardiogenic syncope, ICH, CVA, metabolic encephalopathy, Hypoglycemia.  Medical Decision Making: Summary:  Patient presents to the emergency department with near syncope and fatigue.  This seems to be associated with him taking his  [name] insulin this morning but then not eating breakfast for several hours.  The patient has a log of his daily blood sugars and insulin doses.  He is prescribed 35 to 45 units of insulin but has been taking these 50 unit doses regularly according to his documentation at home.  There seems to have been a delay between when he would normally eat breakfast by several hours.  At the time of ED arrival in its nearly 6 hours into his insulin dose.  He is awake and alert after getting D50 with EMS and drinking some orange juice here.  Plan to give him a meal.  He has no focal neurologic deficit on my exam.  His EKG here shows A. fib but rate controlled and overall reassuring with no ischemic change.  Doubt cardiogenic syncope clinically.  Plan for serial CBGs and will obtain screening blood work.  Do not see indication for advanced neuroimaging or chest imaging at this time.    I did Additional Historical Information from family at bedside and by phone.  They  confirmed the history as above.  The patient's daughter, at bedside, was present during the near syncope event.  No diaphoresis or seizure activity.   I decided to review pertinent External Data, and in summary patient saw Dr. Gwenlyn Found with Cardiology in late October. No Angina symptoms today.   Clinical Laboratory Tests Ordered, included CBC, CMP, troponin.  CBC without leukocytosis.  Very mild anemia similar to prior.  Radiologic Tests Ordered, considered neuroimaging and chest x-Abdulwahab but given history and patient's exam/presentation will defer these imaging studies for now.   Cardiac Monitor Tracing which shows A-fib with good rate control.   Reevaluation with update and discussion with patient and daughter. Patient has been observed in the ED for several hours. CBG running on the high side. He will f/u with PCP for insulin dose consideration. Advised patient to eat soon after taking AM insulin.   ____________________________________________  FINAL CLINICAL IMPRESSION(S) / ED DIAGNOSES  Final diagnoses:  Hypoglycemia     MEDICATIONS GIVEN DURING THIS VISIT:  Medications  sodium chloride 0.9 % bolus 500 mL (0 mLs Intravenous Stopped 10/27/21 1426)      Note:  This document was prepared using Dragon voice recognition software and may include unintentional dictation errors.  Nanda Quinton, MD, [facility] Emergency Medicine    Nil Xiong, Wonda Olds, MD 10/29/21 [phone]

## 2021-10-27 NOTE — ED Notes (Signed)
Dc instructions reviewed with pt no questions or concerns at this time. Will follow up with PCP and the [facility] for medication review. Pt wheeled out to vehicle and daughter transported pt home.

## 2021-10-30 DIAGNOSIS — D1801 Hemangioma of skin and subcutaneous tissue: Secondary | ICD-10-CM | POA: Diagnosis not present

## 2021-10-30 DIAGNOSIS — L821 Other seborrheic keratosis: Secondary | ICD-10-CM | POA: Diagnosis not present

## 2021-10-30 DIAGNOSIS — D692 Other nonthrombocytopenic purpura: Secondary | ICD-10-CM | POA: Diagnosis not present

## 2021-10-30 DIAGNOSIS — Z85828 Personal history of other malignant neoplasm of skin: Secondary | ICD-10-CM | POA: Diagnosis not present

## 2021-11-23 ENCOUNTER — Other Ambulatory Visit: Payer: Self-pay

## 2021-11-23 ENCOUNTER — Encounter: Payer: Self-pay | Admitting: Cardiovascular Disease

## 2021-11-23 ENCOUNTER — Ambulatory Visit: Payer: Medicare PPO | Admitting: Cardiovascular Disease

## 2021-11-23 DIAGNOSIS — E782 Mixed hyperlipidemia: Secondary | ICD-10-CM

## 2021-11-23 DIAGNOSIS — I480 Paroxysmal atrial fibrillation: Secondary | ICD-10-CM

## 2021-11-23 DIAGNOSIS — R079 Chest pain, unspecified: Secondary | ICD-10-CM

## 2021-11-23 DIAGNOSIS — I10 Essential (primary) hypertension: Secondary | ICD-10-CM

## 2021-11-23 NOTE — Assessment & Plan Note (Signed)
History of persistent A-fib rate controlled not on oral anticoagulation because of age, comorbidities and fall risk.

## 2021-11-23 NOTE — Assessment & Plan Note (Addendum)
History of hyperlipidemia on high-dose statin therapy followed by his PCP.  His most recent lipid profile performed at the [facility] revealed total cholesterol 165, LDL 72 and HDL of 69.

## 2021-11-23 NOTE — Patient Instructions (Signed)
Medication Instructions:  ?Your physician recommends that you continue on your current medications as directed. Please refer to the Current Medication list given to you today. ? ?*If you need a refill on your cardiac medications before your next appointment, please call your pharmacy* ? ? ?Follow-Up: ?At [facility], you and your health needs are our priority.  As part of our continuing mission to provide you with exceptional heart care, we have created designated Provider Care Teams.  These Care Teams include your primary Cardiologist (physician) and Advanced Practice Providers (APPs -  Physician Assistants and Nurse Practitioners) who all work together to provide you with the care you need, when you need it. ? ?We recommend signing up for the patient portal called "MyChart".  Sign up information is provided on this After Visit Summary.  MyChart is used to connect with patients for Virtual Visits (Telemedicine).  Patients are able to view lab/test results, encounter notes, upcoming appointments, etc.  Non-urgent messages can be sent to your provider as well.   ?To learn more about what you can do with MyChart, go to [email or web address].   ? ?Your next appointment:   ?6 month(s) ? ?The format for your next appointment:   ?In Person ? ?Provider:   ?Jesse Cleaver, FNP, Angela Duke, PA-C, Callie Goodrich, PA-C, Jennifer, Lambert, PA-C, Kathryn Lawrence, DNP, ANP, or Hao Meng, PA-C     ? ? ?Then, Jonathan Berry, MD will plan to see you again in 12 month(s).  ?

## 2021-11-23 NOTE — Progress Notes (Signed)
11/23/2021 [name]   [birth year]  [id number]  Primary Physician Robert Melter, MD Primary Cardiologist: Robert Harp MD [name], [name]  HPI:  [name] is a 88 y.o.  mildly overweight, married Caucasian male, father of 97, grandfather to 2 grandchildren whose wife [name] today who is also a patient of mine.  He is accompanied by his son-in-law [name] and his daughter [name] who is on the phone.  She is a professor of nursing in [place].  He was initially referred by Dr. Gladstone [name] for cardiovascular clearance prior to elective total knee replacement.  I last saw him in the office 07/13/2019.  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   His cardiovascular risk factor profile is remarkable for remote tobacco abuse, treated hypertension, diabetes and hyperlipidemia. There is no family history. He did have an elective right carotid endarterectomy many years ago performed by Dr. Sherren Mocha [name] who follows his carotid Dopplers. He is otherwise asymptomatic. He did go to the [facility] and had an EKG that showed atrial flutter with variable block on August 18, 2012. He was completely unaware of this. His lipid profile followed by his PCP. Since I saw him a year ago he denies chest pain or shortness of breath.   He was admitted with abdominal pain last month and was found to have acute cholecystitis.  He underwent preoperative clearance by Dr. Meda [name] who cleared him for the procedure.  He had uncomplicated laparoscopic cholecystectomy.  Currently getting physical therapy.  We issue of atrial fibrillation was raised since he was in A. fib at the time and it was decided not to pursue anticoagulation because of his age, comorbidities and fall risk.  A 2D echo performed during the hospitalization 06/10/2019 revealed normal LV systolic function and normal valvular function.  Since I saw him 3 months ago I did [place] titrate his Imdur from  15 to 30 mg a day.  His angina has resolved.  I did do a coronary calcium score on him 08/08/2021 revealing a coronary calcium score of 1207 with FFR analysis showing proximal first OM branch disease.   Current Meds  Medication Sig   acetaminophen (TYLENOL) 500 MG tablet Take 500 mg by mouth every 6 (six) hours as needed (for pain).   aspirin EC 81 MG tablet Take 1 tablet (81 mg total) by mouth daily.   atorvastatin (LIPITOR) 80 MG tablet Take 80 mg by mouth every evening.    diltiazem (DILACOR XR) 180 MG 24 hr capsule Take 180 mg by mouth daily.   gabapentin (NEURONTIN) 300 MG capsule Take 600 mg by mouth 2 (two) times daily.   gemfibrozil (LOPID) 600 MG tablet Take 600 mg by mouth daily.    insulin detemir (LEVEMIR) 100 UNIT/ML injection Inject 50 Units into the skin daily as needed (for elevated BGL).   lidocaine (LIDODERM) 5 % Place 1 patch onto the skin daily as needed (for pain- on 12 hours/off 12 hours).   losartan (COZAAR) 100 MG tablet Take 100 mg by mouth daily before breakfast.    nitroGLYCERIN (NITROSTAT) 0.3 MG SL tablet Place under the tongue.   Omega-3 1000 MG CAPS Take 2 g by mouth 2 (two) times daily.   omeprazole (PRILOSEC) 20 MG capsule Take 20 mg by mouth daily.   pioglitazone (ACTOS) 30 MG tablet Take 30 mg by mouth daily.   traZODone (DESYREL) 100 MG tablet Take 100 mg by  mouth at bedtime as needed for sleep.      No Known Allergies  Social History   Socioeconomic History   Marital status: Married    Spouse name: Not on file   Number of children: Not on file   Years of education: Not on file   Highest education level: Not on file  Occupational History   Occupation: retired  Tobacco Use   Smoking status: Former    Types: Cigarettes    Quit date: 10/21/1976    Years since quitting: 45.1   Smokeless tobacco: Never  Substance and Sexual Activity   Alcohol use: Yes    Alcohol/week: 1.0 standard drink    Types: 1 Glasses of wine per week    Comment: 1-2  alcoholic drinks daily   Drug use: No   Sexual activity: Not on file  Other Topics Concern   Not on file  Social History Narrative   Not on file   Social Determinants of Health   Financial Resource Strain: Not on file  Food Insecurity: Not on file  Transportation Needs: Not on file  Physical Activity: Not on file  Stress: Not on file  Social Connections: Not on file  Intimate Partner Violence: Not on file     Review of Systems: General: negative for chills, fever, night sweats or weight changes.  Cardiovascular: negative for chest pain, dyspnea on exertion, edema, orthopnea, palpitations, paroxysmal nocturnal dyspnea or shortness of breath Dermatological: negative for rash Respiratory: negative for cough or wheezing Urologic: negative for hematuria Abdominal: negative for nausea, vomiting, diarrhea, bright red blood per rectum, melena, or hematemesis Neurologic: negative for visual changes, syncope, or dizziness All other systems reviewed and are otherwise negative except as noted above.    Blood pressure 120/64, pulse 62, resp. rate 20, height 6' (1.829 m), weight 212 lb (96.2 kg), SpO2 96 %.  General appearance: alert and no distress Neck: no adenopathy, no carotid bruit, no JVD, supple, symmetrical, trachea midline, and thyroid not enlarged, symmetric, no tenderness/mass/nodules Lungs: clear to auscultation bilaterally Heart: irregularly irregular rhythm Extremities: extremities normal, atraumatic, no cyanosis or edema Pulses: 2+ and symmetric Skin: Skin color, texture, turgor normal. No rashes or lesions Neurologic: Grossly normal  EKG atrial fibrillation with ventricular sponsor of 62, right bundle branch block.  I personally reviewed this EKG.  ASSESSMENT AND PLAN:   Occlusion and stenosis of carotid artery without mention of cerebral infarction History of carotid artery disease status post remote right carotid endarterectomy performed by [name].  He has not had  carotid Doppler since 2015.  Essential hypertension History of essential hypertension blood pressure measured today at 120/64 he is on diltiazem, and losartan.  Hyperlipidemia History of hyperlipidemia on high-dose statin therapy followed by his PCP.  His most recent lipid profile performed at the [facility] revealed total cholesterol 165, LDL 72 and HDL of 69.  AF (paroxysmal atrial fibrillation) (HCC) History of persistent A-fib rate controlled not on oral anticoagulation because of age, comorbidities and fall risk.  Chest pain of uncertain etiology History of chest pain with a coronary CTA performed 08/08/2021 revealing a coronary calcium score of 1207 with what appeared to be a physiologically significant lesion at the origin of OM1.  The distal LAD had tapering as well.  I titrated his Imdur from 15 to 30 mg when I saw him 3 months ago and he no longer has chest pain.     Robert Harp MD FACP,FACC,FAHA, [facility] 11/23/2021 2:06 PM

## 2021-11-23 NOTE — Assessment & Plan Note (Signed)
History of carotid artery disease status post remote right carotid endarterectomy performed by Dr. Donnetta Hutching.  He has not had carotid Doppler since 2015.

## 2021-11-23 NOTE — Assessment & Plan Note (Signed)
History of chest pain with a coronary CTA performed 08/08/2021 revealing a coronary calcium score of 1207 with what appeared to be a physiologically significant lesion at the origin of OM1.  The distal LAD had tapering as well.  I titrated his Imdur from 15 to 30 mg when I saw him 3 months ago and he no longer has chest pain.

## 2021-11-23 NOTE — Assessment & Plan Note (Signed)
History of essential hypertension blood pressure measured today at 120/64 he is on diltiazem, and losartan.

## 2021-12-06 ENCOUNTER — Ambulatory Visit: Payer: Medicare PPO | Admitting: Podiatry

## 2021-12-06 ENCOUNTER — Encounter: Payer: Self-pay | Admitting: Podiatry

## 2021-12-06 ENCOUNTER — Other Ambulatory Visit: Payer: Self-pay

## 2021-12-06 DIAGNOSIS — M79674 Pain in right toe(s): Secondary | ICD-10-CM

## 2021-12-06 DIAGNOSIS — E1142 Type 2 diabetes mellitus with diabetic polyneuropathy: Secondary | ICD-10-CM

## 2021-12-06 DIAGNOSIS — B351 Tinea unguium: Secondary | ICD-10-CM

## 2021-12-06 DIAGNOSIS — M79675 Pain in left toe(s): Secondary | ICD-10-CM

## 2021-12-06 NOTE — Progress Notes (Signed)
°  Subjective:  Patient ID: [name], male    DOB: [birth year],   MRN: [id number]  Chief Complaint  Patient presents with   Nail Problem    Routine foot care    88 y.o. male presents for concern of thickened elongated and painful nails that are difficult to trim. Requesting to have them trimmed today. Relates occasional burning or tingling in her feet. Patient is diabetic and last A1c was 7 06/06/2019  . Denies any other pedal complaints. Denies n/v/f/c.   PCP: [name] MD   Past Medical History:  Diagnosis Date   Anemia    related to frequent nosebleeds-right nostril.   Aortic insufficiency    a. mild by echo 2013.   Arthritis    arthritis -back knees, most joints   Atrial flutter ([place])    a. per report in 2013.   Breathing difficulty 11-10-12   difficult to breath if laying posteriorly, right nare difficulty   Carotid artery disease ([place])    a. Remote R [name] in the 1990s per pt.   Change in hearing    Diabetes mellitus age 66   Dysrhythmia 07/2012   hx. Atrial Flutter x1, converted spontaneously-no ploblems.    H/O hiatal hernia    HOH (hard of hearing) 11-10-12   bilaterally   Hyperlipidemia    Hypertension    Ulcer 1995   bleeding ulcer, none since    Objective:  Physical Exam: Vascular: DP/PT pulses 2/4 bilateral. CFT <3 seconds. Absent hair growth and bilateral lower extremity edema and xerosis.  Skin. No lacerations or abrasions bilateral feet. Nails 1-5 are thickened discolored and elongated with subungual debris.  Musculoskeletal: MMT 5/5 bilateral lower extremities in DF, PF, Inversion and Eversion. Deceased ROM in DF of ankle joint.  Neurological: Sensation diminished to light touch.   Assessment:   1. Type 2 diabetes mellitus with diabetic polyneuropathy, unspecified whether long term insulin use (HCC)   2. Pain due to onychomycosis of toenails of both feet      Plan:  Patient was evaluated and treated and all questions answered. -Discussed and  educated patient on diabetic foot care, especially with  regards to the vascular, neurological and musculoskeletal systems.  -Stressed the importance of good glycemic control and the detriment of not  controlling glucose levels in relation to the foot. -Discussed supportive shoes at all times and checking feet regularly.  -Mechanically debrided all nails 1-5 bilateral using sterile nail nipper and filed with dremel without incident  -Answered all patient questions -Patient to return  in 3 months for at risk foot care -Patient advised to call the office if any problems or questions arise in the meantime.   Lorenda Peck, DPM

## 2021-12-27 DIAGNOSIS — I4891 Unspecified atrial fibrillation: Secondary | ICD-10-CM | POA: Diagnosis not present

## 2021-12-27 DIAGNOSIS — I10 Essential (primary) hypertension: Secondary | ICD-10-CM | POA: Diagnosis not present

## 2021-12-27 DIAGNOSIS — G4700 Insomnia, unspecified: Secondary | ICD-10-CM | POA: Diagnosis not present

## 2021-12-27 DIAGNOSIS — D6869 Other thrombophilia: Secondary | ICD-10-CM | POA: Diagnosis not present

## 2021-12-27 DIAGNOSIS — E1142 Type 2 diabetes mellitus with diabetic polyneuropathy: Secondary | ICD-10-CM | POA: Diagnosis not present

## 2021-12-27 DIAGNOSIS — Z794 Long term (current) use of insulin: Secondary | ICD-10-CM | POA: Diagnosis not present

## 2021-12-27 DIAGNOSIS — K219 Gastro-esophageal reflux disease without esophagitis: Secondary | ICD-10-CM | POA: Diagnosis not present

## 2021-12-27 DIAGNOSIS — E785 Hyperlipidemia, unspecified: Secondary | ICD-10-CM | POA: Diagnosis not present

## 2021-12-27 DIAGNOSIS — G8929 Other chronic pain: Secondary | ICD-10-CM | POA: Diagnosis not present

## 2022-01-20 ENCOUNTER — Emergency Department (HOSPITAL_BASED_OUTPATIENT_CLINIC_OR_DEPARTMENT_OTHER): Payer: Medicare PPO

## 2022-01-20 ENCOUNTER — Inpatient Hospital Stay (HOSPITAL_BASED_OUTPATIENT_CLINIC_OR_DEPARTMENT_OTHER)
Admission: EM | Admit: 2022-01-20 | Discharge: 2022-01-22 | DRG: 281 | Disposition: A | Discharge status: Home Health | Payer: Medicare PPO | Attending: Internal Medicine | Admitting: Internal Medicine

## 2022-01-20 ENCOUNTER — Other Ambulatory Visit: Payer: Self-pay

## 2022-01-20 ENCOUNTER — Encounter (HOSPITAL_BASED_OUTPATIENT_CLINIC_OR_DEPARTMENT_OTHER): Payer: Self-pay | Admitting: Emergency Medicine

## 2022-01-20 DIAGNOSIS — I214 Non-ST elevation (NSTEMI) myocardial infarction: Secondary | ICD-10-CM | POA: Diagnosis not present

## 2022-01-20 DIAGNOSIS — I25118 Atherosclerotic heart disease of native coronary artery with other forms of angina pectoris: Secondary | ICD-10-CM | POA: Diagnosis present

## 2022-01-20 DIAGNOSIS — H9193 Unspecified hearing loss, bilateral: Secondary | ICD-10-CM | POA: Diagnosis present

## 2022-01-20 DIAGNOSIS — Z20822 Contact with and (suspected) exposure to covid-19: Secondary | ICD-10-CM | POA: Diagnosis present

## 2022-01-20 DIAGNOSIS — E1165 Type 2 diabetes mellitus with hyperglycemia: Secondary | ICD-10-CM

## 2022-01-20 DIAGNOSIS — Y92002 Bathroom of unspecified non-institutional (private) residence single-family (private) house as the place of occurrence of the external cause: Secondary | ICD-10-CM | POA: Diagnosis not present

## 2022-01-20 DIAGNOSIS — R531 Weakness: Principal | ICD-10-CM

## 2022-01-20 DIAGNOSIS — I129 Hypertensive chronic kidney disease with stage 1 through stage 4 chronic kidney disease, or unspecified chronic kidney disease: Secondary | ICD-10-CM | POA: Diagnosis present

## 2022-01-20 DIAGNOSIS — Z794 Long term (current) use of insulin: Secondary | ICD-10-CM | POA: Diagnosis not present

## 2022-01-20 DIAGNOSIS — Z66 Do not resuscitate: Secondary | ICD-10-CM | POA: Diagnosis present

## 2022-01-20 DIAGNOSIS — S0990XA Unspecified injury of head, initial encounter: Secondary | ICD-10-CM | POA: Diagnosis not present

## 2022-01-20 DIAGNOSIS — E876 Hypokalemia: Secondary | ICD-10-CM | POA: Diagnosis present

## 2022-01-20 DIAGNOSIS — I480 Paroxysmal atrial fibrillation: Secondary | ICD-10-CM | POA: Diagnosis present

## 2022-01-20 DIAGNOSIS — I4821 Permanent atrial fibrillation: Secondary | ICD-10-CM | POA: Diagnosis present

## 2022-01-20 DIAGNOSIS — I10 Essential (primary) hypertension: Secondary | ICD-10-CM | POA: Diagnosis present

## 2022-01-20 DIAGNOSIS — G319 Degenerative disease of nervous system, unspecified: Secondary | ICD-10-CM | POA: Diagnosis not present

## 2022-01-20 DIAGNOSIS — I083 Combined rheumatic disorders of mitral, aortic and tricuspid valves: Secondary | ICD-10-CM | POA: Diagnosis present

## 2022-01-20 DIAGNOSIS — N1831 Chronic kidney disease, stage 3a: Secondary | ICD-10-CM | POA: Diagnosis present

## 2022-01-20 DIAGNOSIS — E1122 Type 2 diabetes mellitus with diabetic chronic kidney disease: Secondary | ICD-10-CM | POA: Diagnosis present

## 2022-01-20 DIAGNOSIS — I21A1 Myocardial infarction type 2: Principal | ICD-10-CM | POA: Diagnosis present

## 2022-01-20 DIAGNOSIS — W1830XA Fall on same level, unspecified, initial encounter: Secondary | ICD-10-CM | POA: Diagnosis present

## 2022-01-20 DIAGNOSIS — N179 Acute kidney failure, unspecified: Secondary | ICD-10-CM

## 2022-01-20 DIAGNOSIS — E119 Type 2 diabetes mellitus without complications: Secondary | ICD-10-CM

## 2022-01-20 DIAGNOSIS — Z7982 Long term (current) use of aspirin: Secondary | ICD-10-CM

## 2022-01-20 DIAGNOSIS — I4892 Unspecified atrial flutter: Secondary | ICD-10-CM | POA: Diagnosis present

## 2022-01-20 DIAGNOSIS — R739 Hyperglycemia, unspecified: Secondary | ICD-10-CM

## 2022-01-20 DIAGNOSIS — Z96651 Presence of right artificial knee joint: Secondary | ICD-10-CM | POA: Diagnosis present

## 2022-01-20 DIAGNOSIS — E785 Hyperlipidemia, unspecified: Secondary | ICD-10-CM | POA: Diagnosis present

## 2022-01-20 DIAGNOSIS — I272 Pulmonary hypertension, unspecified: Secondary | ICD-10-CM | POA: Diagnosis present

## 2022-01-20 DIAGNOSIS — I700 Atherosclerosis of aorta: Secondary | ICD-10-CM | POA: Diagnosis not present

## 2022-01-20 DIAGNOSIS — I351 Nonrheumatic aortic (valve) insufficiency: Secondary | ICD-10-CM | POA: Diagnosis not present

## 2022-01-20 DIAGNOSIS — Z87891 Personal history of nicotine dependence: Secondary | ICD-10-CM

## 2022-01-20 DIAGNOSIS — S41111A Laceration without foreign body of right upper arm, initial encounter: Secondary | ICD-10-CM | POA: Diagnosis present

## 2022-01-20 DIAGNOSIS — Z8249 Family history of ischemic heart disease and other diseases of the circulatory system: Secondary | ICD-10-CM | POA: Diagnosis not present

## 2022-01-20 DIAGNOSIS — Z79899 Other long term (current) drug therapy: Secondary | ICD-10-CM | POA: Diagnosis not present

## 2022-01-20 LAB — URINALYSIS, ROUTINE W REFLEX MICROSCOPIC
Bilirubin Urine: NEGATIVE
Glucose, UA: >=500 mg/dL — AB
Hgb urine dipstick: NEGATIVE
Ketones, ur: NEGATIVE mg/dL
Nitrite: NEGATIVE
Protein, ur: NEGATIVE mg/dL
Specific Gravity, Urine: 1.015 (ref 1.005–1.030)
pH: 5.5 (ref 5.0–8.0)

## 2022-01-20 LAB — URINALYSIS, MICROSCOPIC (REFLEX)

## 2022-01-20 LAB — COMPREHENSIVE METABOLIC PANEL
ALT: 21 U/L (ref 0–44)
AST: 25 U/L (ref 15–41)
Albumin: 3.2 g/dL — ABNORMAL LOW (ref 3.5–5.0)
Alkaline Phosphatase: 115 U/L (ref 38–126)
Anion gap: 11 (ref 5–15)
BUN: 41 mg/dL — ABNORMAL HIGH (ref 8–23)
CO2: 22 mmol/L (ref 22–32)
Calcium: 8.6 mg/dL — ABNORMAL LOW (ref 8.9–10.3)
Chloride: 103 mmol/L (ref 98–111)
Creatinine, Ser: 1.82 mg/dL — ABNORMAL HIGH (ref 0.61–1.24)
GFR, Estimated: 35 mL/min — ABNORMAL LOW (ref >60)
Glucose, Bld: 546 mg/dL (ref 70–99)
Potassium: 4.3 mmol/L (ref 3.5–5.1)
Sodium: 136 mmol/L (ref 135–145)
Total Bilirubin: 0.9 mg/dL (ref 0.3–1.2)
Total Protein: 6.6 g/dL (ref 6.5–8.1)

## 2022-01-20 LAB — RESP PANEL BY RT-PCR (FLU A&B, COVID) ARPGX2
Influenza A by PCR: NEGATIVE
Influenza B by PCR: NEGATIVE
SARS Coronavirus 2 by RT PCR: NEGATIVE

## 2022-01-20 LAB — CBC
HCT: 36.3 % — ABNORMAL LOW (ref 39.0–52.0)
Hemoglobin: 12.1 g/dL — ABNORMAL LOW (ref 13.0–17.0)
MCH: 31.5 pg (ref 26.0–34.0)
MCHC: 33.3 g/dL (ref 30.0–36.0)
MCV: 94.5 fL (ref 80.0–100.0)
Platelets: 171 10*3/uL (ref 150–400)
RBC: 3.84 MIL/uL — ABNORMAL LOW (ref 4.22–5.81)
RDW: 15.4 % (ref 11.5–15.5)
WBC: 8.4 10*3/uL (ref 4.0–10.5)
nRBC: 0 % (ref 0.0–0.2)

## 2022-01-20 LAB — MAGNESIUM: Magnesium: 1.9 mg/dL (ref 1.7–2.4)

## 2022-01-20 LAB — CK: Total CK: 101 U/L (ref 49–397)

## 2022-01-20 LAB — GLUCOSE, CAPILLARY
Glucose-Capillary: 372 mg/dL — ABNORMAL HIGH (ref 70–99)
Glucose-Capillary: 183 mg/dL — ABNORMAL HIGH (ref 70–99)

## 2022-01-20 LAB — CBG MONITORING, ED
Glucose-Capillary: 534 mg/dL (ref 70–99)
Glucose-Capillary: 382 mg/dL — ABNORMAL HIGH (ref 70–99)

## 2022-01-20 LAB — TROPONIN I (HIGH SENSITIVITY)
Troponin I (High Sensitivity): 505 ng/L (ref <18)
Troponin I (High Sensitivity): 471 ng/L (ref <18)

## 2022-01-20 MED ORDER — ASPIRIN EC 81 MG PO TBEC
81.0000 mg | DELAYED_RELEASE_TABLET | Freq: Every day | ORAL | Status: DC
  Administered 2022-01-21 – 2022-01-22 (×2): 81 mg via ORAL
  Filled 2022-01-20 (×2): qty 1

## 2022-01-20 MED ORDER — ONDANSETRON HCL 4 MG/2ML IJ SOLN: 4.0000 mg | Freq: Four times a day (QID) | INTRAMUSCULAR | Status: DC | PRN

## 2022-01-20 MED ORDER — ISOSORBIDE MONONITRATE ER 30 MG PO TB24
30.0000 mg | ORAL_TABLET | Freq: Every day | ORAL | Status: DC
  Administered 2022-01-21 – 2022-01-22 (×2): 30 mg via ORAL
  Filled 2022-01-20 (×2): qty 1

## 2022-01-20 MED ORDER — TRAZODONE HCL 100 MG PO TABS
100.0000 mg | ORAL_TABLET | Freq: Every evening | ORAL | Status: DC | PRN
  Administered 2022-01-20 – 2022-01-21 (×2): 100 mg via ORAL
  Filled 2022-01-20 (×2): qty 1

## 2022-01-20 MED ORDER — INSULIN DETEMIR 100 UNIT/ML ~~LOC~~ SOLN
22.0000 [IU] | Freq: Every day | SUBCUTANEOUS | Status: DC
  Filled 2022-01-20: qty 0.22

## 2022-01-20 MED ORDER — INSULIN ASPART 100 UNIT/ML IJ SOLN
0.0000 [IU] | Freq: Three times a day (TID) | INTRAMUSCULAR | Status: DC
  Administered 2022-01-20: 9 [IU] via SUBCUTANEOUS
  Administered 2022-01-21: 3 [IU] via SUBCUTANEOUS
  Administered 2022-01-21: 5 [IU] via SUBCUTANEOUS
  Administered 2022-01-22: 2 [IU] via SUBCUTANEOUS

## 2022-01-20 MED ORDER — METOPROLOL SUCCINATE ER 25 MG PO TB24: 12.5000 mg | ORAL_TABLET | Freq: Every day | ORAL | Status: DC

## 2022-01-20 MED ORDER — INSULIN ASPART 100 UNIT/ML IJ SOLN
10.0000 [IU] | Freq: Once | INTRAMUSCULAR | Status: AC
Start: 2022-01-20 — End: 2022-01-20
  Administered 2022-01-20: 10 [IU] via SUBCUTANEOUS

## 2022-01-20 MED ORDER — PANTOPRAZOLE SODIUM 40 MG PO TBEC
40.0000 mg | DELAYED_RELEASE_TABLET | Freq: Every day | ORAL | Status: DC
  Administered 2022-01-21 – 2022-01-22 (×2): 40 mg via ORAL
  Filled 2022-01-20 (×2): qty 1

## 2022-01-20 MED ORDER — SODIUM CHLORIDE 0.9 % IV SOLN: 250.0000 mL | INTRAVENOUS | Status: DC | PRN

## 2022-01-20 MED ORDER — INSULIN DETEMIR 100 UNIT/ML ~~LOC~~ SOLN
20.0000 [IU] | Freq: Every day | SUBCUTANEOUS | Status: DC
  Administered 2022-01-20 – 2022-01-21 (×2): 20 [IU] via SUBCUTANEOUS
  Filled 2022-01-20 (×3): qty 0.2

## 2022-01-20 MED ORDER — ENOXAPARIN SODIUM 40 MG/0.4ML IJ SOSY
40.0000 mg | PREFILLED_SYRINGE | INTRAMUSCULAR | Status: DC
  Administered 2022-01-20 – 2022-01-21 (×2): 40 mg via SUBCUTANEOUS
  Filled 2022-01-20 (×2): qty 0.4

## 2022-01-20 MED ORDER — SODIUM CHLORIDE 0.9% FLUSH: 3.0000 mL | INTRAVENOUS | Status: DC | PRN

## 2022-01-20 MED ORDER — SODIUM CHLORIDE 0.9% FLUSH
3.0000 mL | Freq: Two times a day (BID) | INTRAVENOUS | Status: DC
  Administered 2022-01-20 – 2022-01-22 (×4): 3 mL via INTRAVENOUS

## 2022-01-20 MED ORDER — DILTIAZEM HCL ER COATED BEADS 180 MG PO CP24
180.0000 mg | ORAL_CAPSULE | Freq: Every day | ORAL | Status: DC
  Administered 2022-01-21 – 2022-01-22 (×2): 180 mg via ORAL
  Filled 2022-01-20 (×3): qty 1

## 2022-01-20 MED ORDER — GABAPENTIN 300 MG PO CAPS
300.0000 mg | ORAL_CAPSULE | Freq: Two times a day (BID) | ORAL | Status: DC
Start: 2022-01-20 — End: 2022-01-22
  Administered 2022-01-20 – 2022-01-22 (×4): 300 mg via ORAL
  Filled 2022-01-20 (×4): qty 1

## 2022-01-20 MED ORDER — GEMFIBROZIL 600 MG PO TABS
600.0000 mg | ORAL_TABLET | Freq: Every day | ORAL | Status: DC
  Administered 2022-01-21: 600 mg via ORAL
  Filled 2022-01-20: qty 1

## 2022-01-20 MED ORDER — SODIUM CHLORIDE 0.9 % IV BOLUS
500.0000 mL | Freq: Once | INTRAVENOUS | Status: AC
  Administered 2022-01-20: 500 mL via INTRAVENOUS

## 2022-01-20 MED ORDER — ATORVASTATIN CALCIUM 80 MG PO TABS
80.0000 mg | ORAL_TABLET | Freq: Every evening | ORAL | Status: DC
  Administered 2022-01-20 – 2022-01-21 (×2): 80 mg via ORAL
  Filled 2022-01-20 (×2): qty 1

## 2022-01-20 MED ORDER — NITROGLYCERIN 0.4 MG SL SUBL: 0.4000 mg | SUBLINGUAL_TABLET | SUBLINGUAL | Status: DC | PRN

## 2022-01-20 MED ORDER — ACETAMINOPHEN 325 MG PO TABS
650.0000 mg | ORAL_TABLET | ORAL | Status: DC | PRN
  Administered 2022-01-21: 650 mg via ORAL
  Filled 2022-01-20: qty 2

## 2022-01-20 NOTE — Assessment & Plan Note (Signed)
History of persistent A-fib rate controlled not on oral anticoagulation because of age, comorbidities and fall risk. ?-rate controlled ?-continue telemetry  ?

## 2022-01-20 NOTE — Assessment & Plan Note (Addendum)
88 year old male with history of CAD, HTN, HLD, T2DM presenting with weakness and falls found to have a NSTEMI ?-place on cardiac telemetry ?-cardiology consulted: Dr. Gasper Sells in ED, Dr.Rose called at [facility] on arrival ?-troponin: [id number] ?-chest pain free, no heparin at this time ?-echo pending  ?-continue '80mg'$  lipitor  ?-continue ASA ?-hold cozaar in setting of AKI, discuss why not on beta blocker  ?-coronary calcium score on him 08/08/2021 revealing a coronary calcium score of 1207 with FFR analysis showing proximal first OM branch dx ?

## 2022-01-20 NOTE — H&P (Addendum)
?History and Physical  ? ? ?Patient: [name] PVX:[id number] DOB: 04-Jul-1930 ?DOA: 01/20/2022 ?DOS: the patient was seen and examined on 01/20/2022 ?PCP: [name], MD  ?Patient coming from:  [facility]  - lives with his wife ? ? ?Chief Complaint: weakness/falls  ? ?HPI: [name] is a 88 y.o. male with medical history significant of  PAF, T2DM, HTN, HLD, presenting with falls x 3 days and weakness. He states Monday morning he took his shower and felt like his legs were weak. He was walking out of bathroom and his legs gave way and he fell down on his knees then hit his buttom and head. He got some bad skin tears on his right arm, tailbone. He states he continued to be weak and it's been hard to get out of bed.  After he is able to get out of bed he uses his walker and has been able to complete his ADLs. He denies ever having dizziness, headache, chest pain/palpitations, shortness of breath/cough, stomach pain, N/V/D, leg swelling.  ? ?No family history of heart disease, remote tobacco abuse.  ? ?ER Course:  vitals: afebrile, bp: 125/59, HR: 74, RR: 16, oxygen: 99%RA ?Pertinent labs: glucose: 534>382, hgb: 12.1, BUN: 41, creatinine: 1.82 (1.2-1.4), troponin 505>471 ?CXR: no acute finding ?CT head: no acute finding ?In ED: cardiology consulted Dr. Gasper Sells. Given 500cc bolus and 10 units insulin.  ? ? ?Review of Systems: As mentioned in the history of present illness. All other systems reviewed and are negative. ?Past Medical History:  ?Diagnosis Date  ? Anemia   ? related to frequent nosebleeds-right nostril.  ? Aortic insufficiency   ? a. mild by echo 2013.  ? Arthritis   ? arthritis -back knees, most joints  ? Atrial flutter ([place])   ? a. per report in 2013.  ? Breathing difficulty 11-10-12  ? difficult to breath if laying posteriorly, right nare difficulty  ? Carotid artery disease ([place])   ? a. Remote R CEA in the 1990s per pt.  ? Change in hearing   ? Diabetes mellitus age 72  ? Dysrhythmia 07/2012  ? hx. Atrial  Flutter x1, converted spontaneously-no ploblems.   ? H/O hiatal hernia   ? HOH (hard of hearing) 11-10-12  ? bilaterally  ? Hyperlipidemia   ? Hypertension   ? Ulcer 1995  ? bleeding ulcer, none since  ? ?Past Surgical History:  ?Procedure Laterality Date  ? CAROTID DUPLEX  06/30/2012  ? PATENT RIGHT CAROTID ENDARTERECTOMY. 1%-39% LEFT ICA. STABLE  ? CAROTID ENDARTERECTOMY  11/242008  ? right with Dacron patch angioplasty  ? CATARACT EXTRACTION  1998 bilateral  done 3 months apart  ? CHOLECYSTECTOMY N/A 06/11/2019  ? Procedure: LAPAROSCOPIC CHOLECYSTECTOMY;  Surgeon: Erroll Luna, MD;  Location: [facility];  Service: General;  Laterality: N/A;  ? ESOPHAGOGASTRODUODENOSCOPY (EGD) WITH PROPOFOL N/A 06/08/2019  ? Procedure: ESOPHAGOGASTRODUODENOSCOPY (EGD) WITH PROPOFOL;  Surgeon: Wilford Corner, MD;  Location: [facility];  Service: Endoscopy;  Laterality: N/A;  ? EYE SURGERY    ? JOINT REPLACEMENT Right 11-18-12  ? KNEE BURSECTOMY  11/18/2012  ? Procedure: KNEE BURSECTOMY;  Surgeon: Tobi Bastos, MD;  Location: [facility];  Service: Orthopedics;  Laterality: Right;  ? NM MYOCAR MULTIPLE W/SPECT  10/08/2012  ? NORMAL STRESS NUCLEAR STUDY.  ? TOTAL KNEE ARTHROPLASTY  11/18/2012  ? Procedure: TOTAL KNEE ARTHROPLASTY;  Surgeon: Tobi Bastos, MD;  Location: [facility];  Service: Orthopedics;  Laterality: Right;  ? TRANSTHORACIC ECHOCARDIOGRAM  10/08/2012  ?  MODERATE LVH. MODERATE CONCENTRIC HYPERTROPHY.EF 65 TO 70%. GRADE 1 DYSTOLIC DYSFUNCTION. ELEVATED VENTRICULAR END-DIASTOLIC FILLING PRESSURE AND LEFT ATRIAL FILLING PRESSURE. AV- MILD TO MODERATE CALCIFIED ANNULUS. MV- CALCIFIC DEGENERATION.Marland Kitchen LA-MILDLY DILATED.  ? ?Social History:  reports that he quit smoking about 45 years ago. His smoking use included cigarettes. He has never used smokeless tobacco. He reports current alcohol use of about 1.0 standard drink per week. He reports that he does not use drugs. ? ?No Known Allergies ? ?Family History  ?Problem Relation Age of  Onset  ? Cancer Mother   ?     Pancreatic  ? Heart disease Mother   ? Cancer Brother   ?     pancreactic  ? Cancer Daughter   ?     Breast  ? ? ?Prior to Admission medications   ?Medication Sig Start Date End Date Taking? Authorizing Provider  ?acetaminophen (TYLENOL) 500 MG tablet Take 500 mg by mouth every 6 (six) hours as needed (for pain).    [provider]  ?aspirin EC 81 MG tablet Take 1 tablet (81 mg total) by mouth daily. 07/13/19   [name], MD  ?atorvastatin (LIPITOR) 80 MG tablet Take 80 mg by mouth every evening.     [provider]  ?diltiazem (DILACOR XR) 180 MG 24 hr capsule Take 180 mg by mouth daily.    [provider]  ?gabapentin (NEURONTIN) 300 MG capsule Take 600 mg by mouth 2 (two) times daily. 08/01/21   [provider]  ?gemfibrozil (LOPID) 600 MG tablet Take 600 mg by mouth daily.     [provider]  ?insulin detemir (LEVEMIR) 100 UNIT/ML injection Inject 50 Units into the skin daily as needed (for elevated BGL).    [provider]  ?isosorbide mononitrate (IMDUR) 30 MG 24 hr tablet Take 1 tablet (30 mg total) by mouth daily. ?Patient taking differently: Take 15 mg by mouth daily. 08/15/21 11/13/21  [name], MD  ?lidocaine (LIDODERM) 5 % Place 1 patch onto the skin daily as needed (for pain- on 12 hours/off 12 hours). 08/07/21   [provider]  ?losartan (COZAAR) 100 MG tablet Take 100 mg by mouth daily before breakfast.     [provider]  ?nitroGLYCERIN (NITROSTAT) 0.3 MG SL tablet Place under the tongue. 07/31/21   [provider]  ?Omega-3 1000 MG CAPS Take 2 g by mouth 2 (two) times daily.    [provider]  ?omeprazole (PRILOSEC) 20 MG capsule Take 20 mg by mouth daily.    [provider]  ?pioglitazone (ACTOS) 30 MG tablet Take 30 mg by mouth daily.    [provider]  ?traZODone (DESYREL) 100 MG tablet Take 100 mg by mouth at bedtime as needed for sleep.      [provider]  ? ? ?Physical Exam: ?Vitals:  ? 01/20/22 1230 01/20/22 1315 01/20/22 1530 01/20/22 1657  ?BP: (!) 123/55 (!) 125/58 105/62 (!) 140/91  ?Pulse: 71 67 69 76  ?Resp: '16 12 17 13  '$ ?Temp:    97.7 ?F (36.5 ?C)  ?TempSrc:    Oral  ?SpO2: 98% 99% 100% 98%  ?Weight:      ?Height:      ? ?General:  Appears calm and comfortable and is in NAD ?Eyes:  PERRL, EOMI, normal lids, iris ?ENT:  hard of hearing,  lips & tongue, mmm; appropriate dentition ?Neck:  no LAD, masses or thyromegaly; no carotid bruits ?Cardiovascular:  irregularly, irregular,  no m/r/g. No LE edema.  ?Respiratory:   CTA bilaterally with no wheezes/rales/rhonchi.  Normal respiratory effort. ?Abdomen:  soft, NT, ND, NABS ?Back:   normal alignment, no CVAT ?Skin:  multiple skin lacerations: right forearm, left upper arm, right sacral area.  ?Musculoskeletal:  grossly normal tone BUE/BLE, good ROM, no bony abnormality ?Lower extremity:  No LE edema.  Limited foot exam with no ulcerations.  2+ distal pulses. ?Psychiatric:  grossly normal mood and affect, speech fluent and appropriate, AOx3 ?Neurologic:  CN 2-12 grossly intact, moves all extremities in coordinated fashion, sensation intact ? ? ?Radiological Exams on Admission: ?Independently reviewed - see discussion in A/P where applicable ? ?DG Chest 2 View ? ?Result Date: 01/20/2022 ?CLINICAL DATA:  Evaluate for pneumonia EXAM: CHEST - 2 VIEW COMPARISON:  07/30/2021 FINDINGS: Unchanged cardiac and mediastinal contours. Aortic atherosclerosis. No new focal pulmonary opacity. No pleural effusion pneumothorax. No acute osseous abnormality. IMPRESSION: No acute cardiopulmonary process. Electronically Signed   By: Merilyn Baba M.D.   On: 01/20/2022 12:41  ? ?CT Head Wo Contrast ? ?Result Date: 01/20/2022 ?CLINICAL DATA:  Head trauma.  Moderate to severe. EXAM: CT HEAD WITHOUT CONTRAST TECHNIQUE: Contiguous axial images were obtained from the base of the skull through the vertex without  intravenous contrast. RADIATION DOSE REDUCTION: This exam was performed according to the departmental dose-optimization program which includes automated exposure control, adjustment of the mA and/or kV accord

## 2022-01-20 NOTE — ED Notes (Signed)
Date and time results received: 01/20/22 2:46 PM ? ?(use smartphrase ".now" to insert current time) ? ?Test: Trop ?Critical Value: 471 ? ?Name of Provider Notified: Dr. Langston Masker ? ?Orders Received? Or Actions Taken?: see chart ?

## 2022-01-20 NOTE — Assessment & Plan Note (Addendum)
Check a1c ?Discussed his sugars/insulin regimen with his family. Sugars are erratic. Last week of sugars from 94-500. Takes a sliding scale of his long acting from 20-30 units, but it doesn't make much sense. Will start him on a regular long acting dose at 20 units.  ?-stop actos, consider GLP-1 ?SSI with qac/hs accuchecks ?Adjust according to A1C with close f/u outpatient with pcp ?

## 2022-01-20 NOTE — Consult Note (Addendum)
? ?Cardiology Consult  ?  ?Patient ID: [name] ?MRN: [id number], DOB/AGE: Jul 01, 1930  ? ?Admit date: 01/20/2022 ?Date of Consult: 01/20/2022 ?Requesting Provider: Orma Flaming, MD ? ?PCP:  [name], MD ?  ?[facility] Providers ?Cardiologist:  Quay Burow, MD   { ? ?Patient Profile  ?  ?[name] is a 88 y.o. 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 He presented with generalized weakness and falls and is being seen today for evaluation of troponin elevation. ? ?History of Present Illness  ?  ?He presented to the ED today for progressive weakness and a fall last week. He reports worsening weakness in his legs over the last 2-3 weeks. Last Monday (about a week ago), his legs felt very weak while showering, resulting in a fall while walking out of the bathroom. The weakness has continued to progress to the point that he has difficulty getting out of bed - he is eventually able to but has been using a walker. He denies any chest pain or dyspnea. No nausea, vomiting or diaphoresis. No leg swelling or orthopnea. His glucose has been very erratic for the last few months - he now only takes insulin if his glucose is over 300. He does feel like he urinates quite a bit. He denies dizziness with standing or otherwise, but gets up very slowly due to weakness in his legs and his arms to a lesser degree - the weakness in his arms is more mild and long-standing without the same degree of progression. He has not had an ? ?On arrival to the ED, VS were normal and initial work-up notable for glucose of 534, hgb 12.1, Cr 1.8 (from 1.2-1.4), and a down-trending troponin of 505->471. CXR and CTA had no acute findings. He was given 500 mL NS and insulin  only. The decision was made to hold off on any treatment for ACS as he had not been having any chest pain, though he was transferred here with a plan for cardiology consultation. ? ?Prior to this, he follows with Dr. Gwenlyn Found and was seen in October 2022 for work-up of new exertional dyspnea and chest pain. He underwent TTE and CCTA, which showed mild biventricular hypertrophy without dilation and normal systolic function, severe PH with high RAP, and mild-moderate AI, as well as severe proximal OM1 stenosis; the LAD had tapering flow limitation by FFR but no discrete stenosis. He was started on Imdur titrated to '30mg'$  and has remained chest-pain free since. He is on aspirin and high-intensity statin. He has been in AF on all ECGs through at least 2020 (last sinus ECG 2015) and was continued on a rate-control only strategy with diltiazem, but no anticoagulation due to his fall risk and co-morbidities, though it appears he is on full-dose aspirin.  ? ? ?Past Medical History  ? ?Past Medical History:  ?Diagnosis Date  ? Anemia   ? related to frequent nosebleeds-right nostril.  ? Aortic insufficiency   ? a. mild by echo 2013.  ? Arthritis   ? arthritis -back knees, most joints  ? Atrial flutter ([place])   ? a. per report in 2013.  ? Breathing difficulty 11-10-12  ? difficult to breath if laying posteriorly, right nare difficulty  ? Carotid artery disease ([place])   ? a. Remote R  [name] in the 1990s per pt.  ? Change in hearing   ? Diabetes mellitus age 71  ? Dysrhythmia 07/2012  ? hx. Atrial Flutter x1, converted spontaneously-no ploblems.   ? H/O hiatal hernia   ? HOH (hard of hearing) 11-10-12  ? bilaterally  ? Hyperlipidemia   ? Hypertension   ? Ulcer 1995  ? bleeding ulcer, none since  ?  ?Past Surgical History:  ?Procedure Laterality Date  ? CAROTID DUPLEX  06/30/2012  ? PATENT RIGHT CAROTID ENDARTERECTOMY. 1%-39% LEFT ICA. STABLE  ? CAROTID ENDARTERECTOMY  11/242008  ? right with Dacron patch angioplasty  ? CATARACT EXTRACTION   1998 bilateral  done 3 months apart  ? CHOLECYSTECTOMY N/A 06/11/2019  ? Procedure: LAPAROSCOPIC CHOLECYSTECTOMY;  Surgeon: Erroll Luna, MD;  Location: [facility];  Service: General;  Laterality: N/A;  ? ESOPHAGOGASTRODUODENOSCOPY (EGD) WITH PROPOFOL N/A 06/08/2019  ? Procedure: ESOPHAGOGASTRODUODENOSCOPY (EGD) WITH PROPOFOL;  Surgeon: Wilford Corner, MD;  Location: [facility];  Service: Endoscopy;  Laterality: N/A;  ? EYE SURGERY    ? JOINT REPLACEMENT Right 11-18-12  ? KNEE BURSECTOMY  11/18/2012  ? Procedure: KNEE BURSECTOMY;  Surgeon: Tobi Bastos, MD;  Location[facility];  Service: Orthopedics;  Laterality: Right;  ? NM MYOCAR MULTIPLE W/SPECT  10/08/2012  ? NORMAL STRESS NUCLEAR STUDY.  ? TOTAL KNEE ARTHROPLASTY  11/18/2012  ? Procedure: TOTAL KNEE ARTHROPLASTY;  Surgeon: Tobi Bastos, MD;  [facility];  Service: Orthopedics;  Laterality: Right;  ? TRANSTHORACIC ECHOCARDIOGRAM  10/08/2012  ? MODERATE LVH. MODERATE CONCENTRIC HYPERTROPHY.EF 65 TO 70%. GRADE 1 DYSTOLIC DYSFUNCTION. ELEVATED VENTRICULAR END-DIASTOLIC FILLING PRESSURE AND LEFT ATRIAL FILLING PRESSURE. AV- MILD TO MODERATE CALCIFIED ANNULUS. MV- CALCIFIC DEGENERATION.Marland Kitchen LA-MILDLY DILATED.  ?  ? ?No Known Allergies ?Inpatient Medications  ?  ? [START ON 01/21/2022] insulin aspart  0-9 Units Subcutaneous TID WC  ? insulin detemir  22 Units Subcutaneous QHS  ? ? ?Family History  ?  ?Family History  ?Problem Relation Age of Onset  ? Cancer Mother   ?     Pancreatic  ? Heart disease Mother   ? Cancer Brother   ?     pancreactic  ? Cancer Daughter   ?     Breast  ? ?He indicated that his mother is deceased. He indicated that his father is deceased. He indicated that his sister is alive. He indicated that only one of his four brothers is alive. He indicated that his maternal grandmother is deceased. He indicated that his maternal grandfather is deceased. He indicated that his paternal grandmother is deceased. He indicated that his paternal  grandfather is deceased. He indicated that the status of his daughter is unknown. ? ? ?Social History  ?  ?Social History  ? ?Socioeconomic History  ? Marital status: Married  ?  Spouse name: Not on file  ? Number of children: Not on file  ? Years of education: Not on file  ? Highest education level: Not on file  ?Occupational History  ? Occupation: retired  ?Tobacco Use  ? Smoking status: Former  ?  Types: Cigarettes  ?  Quit date: 10/21/1976  ?  Years since quitting: 45.2  ? Smokeless tobacco: Never  ?Substance and Sexual Activity  ? Alcohol use: Yes  ?  Alcohol/week: 1.0 standard drink  ?  Types: 1 Glasses of wine per week  ?  Comment: 1-2 alcoholic drinks daily  ? Drug use: No  ? Sexual activity: Not on file  ?  Other Topics Concern  ? Not on file  ?Social History Narrative  ? Not on file  ? ?Social Determinants of Health  ? ?Financial Resource Strain: Not on file  ?Food Insecurity: Not on file  ?Transportation Needs: Not on file  ?Physical Activity: Not on file  ?Stress: Not on file  ?Social Connections: Not on file  ?Intimate Partner Violence: Not on file  ?  ? ?Review of Systems  ?  ?A comprehensive review of systems was performed with pertinent positives and negatives noted in the HPI. ? ?Physical Exam  ?  ?Blood pressure (!) 140/91, pulse 76, temperature 97.7 ?F (36.5 ?C), temperature source Oral, resp. rate 13, height 6' (1.829 m), weight 88.1 kg, SpO2 98 %.  ?  ?No intake or output data in the 24 hours ending 01/20/22 1805 ?Wt Readings from Last 3 Encounters:  ?01/20/22 88.1 kg  ?11/23/21 96.2 kg  ?10/27/21 95.3 kg  ? ? ?CONSTITUTIONAL: elderly male, alert and conversant, hard of hearing, nourished, in no distress ?HEENT: conjunctival erythema, hearing impaired, no evidence of trauma ?NECK: no masses, symmetric, JVP around 8-10 cm H2O ?CARDIAC: Irregular rhythm. Heart sounds a little diminished. No appreciable S3/S4 or murmurs. No friction rub. ?VASCULAR: Radial pulses intact bilaterally. No carotid  bruits. ?PULMONARY/CHEST WALL: no deformities, normal breath sounds bilaterally, normal work of breathing ?ABDOMINAL: soft, non-tender, non-distended ?EXTREMITIES: no edema, warm and well-perfused ?SKIN: Dry and intac

## 2022-01-20 NOTE — Assessment & Plan Note (Signed)
Repeat fasting lipid panel in AM ?Continue lipitor '80mg'$   ?

## 2022-01-20 NOTE — Progress Notes (Signed)
Plan of Care Note for accepted transfer ? ? ?Patient: [name] MRN: [id number]   DOA: 01/20/2022 ? ?Facility requesting transfer: [facility] ?Requesting Provider: Dr. Langston Masker  ?Reason for transfer: NSTEMI ?Facility course: 88 y.o with history of PAF, T2DM, HTN, HLD, presenting with falls x 3 days and weakness. Weakness has gotten worse, can't get out of bed. No chest pain, nausea/vomiting.  ? ?Vitals stable ?Pertinent labs: troponin: 505>delta pending, BS: 550, BN: 41, creatinine: 1.82 (1.2-1.4) ?Ekg: rate controlled afib. ? ?Talked to cardiology, Dr. Gasper Sells [facility]. Discuss cath, has opted not to in the past. Pain free, no heparin for now. Call cardiology when he gets here.  ? ?Plan of care: ?The patient is accepted for admission to Telemetry unit, at [facility]..  ? ? ?Author: ?Orma Flaming, MD ?01/20/2022 ? ?Check [email or web address] for on-call coverage. ? ?Nursing staff, Please call [place] number on Amion as soon as patient's arrival, so appropriate admitting provider can evaluate the pt. ?

## 2022-01-20 NOTE — Assessment & Plan Note (Addendum)
Well controlled.  ?Holding cozaar with AKI ?Continue diltiazem/imdur ?? No beta blocker?  ?

## 2022-01-20 NOTE — Assessment & Plan Note (Signed)
Likely prerenal in setting of NSTEMI-checking echo ?-UA with no blood ?-strict I/O ?-received 500cc bolus ?-hold nephrotoxic drugs ?-trend bmp  ?

## 2022-01-20 NOTE — Progress Notes (Signed)
Inpatient Diabetes Program Recommendations ? ?AACE/ADA: New Consensus Statement on Inpatient Glycemic Control (2015) ? ?Target Ranges:  Prepandial:   less than 140 mg/dL ?     Peak postprandial:   less than 180 mg/dL (1-2 hours) ?     Critically ill patients:  140 - 180 mg/dL  ? ? Latest Reference Range & Units 01/20/22 11:36  ?Sodium 135 - 145 mmol/L 136  ?Potassium 3.5 - 5.1 mmol/L 4.3  ?Chloride 98 - 111 mmol/L 103  ?CO2 22 - 32 mmol/L 22  ?Glucose 70 - 99 mg/dL 546 (HH)  ?BUN 8 - 23 mg/dL 41 (H)  ?Creatinine 0.61 - 1.24 mg/dL 1.82 (H)  ?Calcium 8.9 - 10.3 mg/dL 8.6 (L)  ?Anion gap 5 - 15  11  ? ? Latest Reference Range & Units 01/20/22 11:23  ?Glucose-Capillary 70 - 99 mg/dL 534 (HH)  ? ? ?To [facility] with Weakness and Fall ? ?History: DM ? ?Home DM Meds: Levemir 15-30 units Daily based on CBG ?       Actos 30 mg daily ? ?Current Orders: ? ? ? ?[facility] show pt supposed to be taking Levemir 45 units QAM + Actos 30 mg daily ?Last A1c on [facility] I can find [facility]--Results were 8.3% 08/07/2021 ? ? ?MD- CBG was 534 at 11:23am today--Pt given 500cc NS IVF bolus at 12pm ? ?Please consider: ?Recheck CBG after fluid bolus ?Give Novolog X 1 dose if still elevated ?If pt has not had Levemir Insulin in the last 24 hours, would give pt Levemir 22 units X 1 dose (0.25 units/kg) ? ?As far as outpatient Levemir dose is concerned, looks like pt taking anywhere from 15-30 units Daily based on CBG.  Could send him home on Levemir 22 units Daily for now (set dose).  If AM CBGs higher than 200 at home, have pt contact PCP (which I believe is the [place]) for further insulin adjustments.  Not sure if pt would be appropriate for adding Novolog SSI TID to home regimen?  Perhaps wife could assist?  If so, we could also send him home with the hospital's Novolog 0-9 unit SSI regimen TID before meals so he could better control CBGs at home. ?121-150- 1 unit ?151-200- 2 units ?201-250- 3 units ?251-300- 5  units ?301-350- 7 units ?351-400- 9 units ? ? ? ?--Will follow patient during hospitalization-- ? ?Wyn Quaker RN, MSN, CDE ?Diabetes Coordinator ?Inpatient Glycemic Control Team ?Team Pager: [phone] (8a-5p) ? ? ? ?

## 2022-01-20 NOTE — ED Provider Notes (Signed)
?[name] ?Provider Note ? ? ?CSN: [id number] ?Arrival date & time: 01/20/22  1114 ? ?  ? ?History ? ?CC: Fall, weakness ? ? ?[name] is a 88 y.o. male presenting to the ED with weakness.  The patient reports he felt weak walking out of the bathroom the other day and fell backwards, landing on his bottom and striking his head on the ground.  He says that his wife CNA was able to help him up into bed.  He has had a lot of weakness in the past 2 days, no energy.  His son-in-law at bedside reports the patient is having difficulty getting himself out of bed at night.  The patient denies that he is having any pain or headache.  He says he simply has no energy to push himself out of bed.  He denies diarrhea, vomiting, nausea, chest pain or pressure. ? ?Hx of A Fib, not on A/C due to bleeding risk  ?Hx of angina - per cardiology note from Dr Quay Burow 07/2021 patient had coronary CTA with calcium show 1207 with multivessel disease, increased imdur dosing to 30 mg at that time. ?Last echo Oct 2022 with EF 60-65% ? ?He also has a history of poorly controlled diabetes, has had fluctuating blood sugars that are quite erratic with hyperglycemia and hypoglycemia.  He is on a consistent regimen with his Levemir, which is his only antiglycemic agent.  He showed me a sheet of his dosing, and appears to take differing doses depending on how high his blood sugars are, from 15 units to 30 units. ? ?HPI ? ?  ? ?Home Medications ?Prior to Admission medications   ?Medication Sig Start Date End Date Taking? Authorizing Provider  ?acetaminophen (TYLENOL) 500 MG tablet Take 500 mg by mouth every 6 (six) hours as needed (for pain).    [provider]  ?aspirin EC 81 MG tablet Take 1 tablet (81 mg total) by mouth daily. 07/13/19   [name], MD  ?atorvastatin (LIPITOR) 80 MG tablet Take 80 mg by mouth every evening.     [provider]  ?diltiazem (DILACOR XR) 180 MG 24 hr capsule  Take 180 mg by mouth daily.    [provider]  ?gabapentin (NEURONTIN) 300 MG capsule Take 600 mg by mouth 2 (two) times daily. 08/01/21   [provider]  ?gemfibrozil (LOPID) 600 MG tablet Take 600 mg by mouth daily.     [provider]  ?insulin detemir (LEVEMIR) 100 UNIT/ML injection Inject 50 Units into the skin daily as needed (for elevated BGL).    [provider]  ?isosorbide mononitrate (IMDUR) 30 MG 24 hr tablet Take 1 tablet (30 mg total) by mouth daily. ?Patient taking differently: Take 15 mg by mouth daily. 08/15/21 11/13/21  [name], MD  ?lidocaine (LIDODERM) 5 % Place 1 patch onto the skin daily as needed (for pain- on 12 hours/off 12 hours). 08/07/21   [provider]  ?losartan (COZAAR) 100 MG tablet Take 100 mg by mouth daily before breakfast.     [provider]  ?nitroGLYCERIN (NITROSTAT) 0.3 MG SL tablet Place under the tongue. 07/31/21   [provider]  ?Omega-3 1000 MG CAPS Take 2 g by mouth 2 (two) times daily.    [provider]  ?omeprazole (PRILOSEC) 20 MG capsule Take 20 mg by mouth daily.    [provider]  ?pioglitazone (ACTOS) 30 MG tablet Take 30 mg by mouth  daily.    [provider]  ?traZODone (DESYREL) 100 MG tablet Take 100 mg by mouth at bedtime as needed for sleep.     [provider]  ?   ? ?Allergies    ?Patient has no known allergies.   ? ?Review of Systems   ?Review of Systems ? ?Physical Exam ?Updated Vital Signs ?BP (!) 125/58   Pulse 67   Temp 97.7 ?F (36.5 ?C) (Oral)   Resp 12   Ht 6' (1.829 m)   Wt 88.1 kg   SpO2 99%   BMI 26.34 kg/m?  ?Physical Exam ?Constitutional:   ?   General: He is not in acute distress. ?HENT:  ?   Head: Normocephalic and atraumatic.  ?Eyes:  ?   Conjunctiva/sclera: Conjunctivae normal.  ?   Pupils: Pupils are equal, round, and reactive to light.  ?Cardiovascular:  ?   Rate and Rhythm: Normal rate. Rhythm irregular.  ?   Pulses:  Normal pulses.  ?Pulmonary:  ?   Effort: Pulmonary effort is normal. No respiratory distress.  ?Abdominal:  ?   General: There is no distension.  ?   Tenderness: There is no abdominal tenderness.  ?Musculoskeletal:     ?   General: No swelling.  ?   Comments: Abrasion to the right forearm superficial skin tear, no significant or focal bony tenderness, ?Normal range of motion at the hips, good strength testing in the lower extremity  ?Skin: ?   General: Skin is warm and dry.  ?Neurological:  ?   General: No focal deficit present.  ?   Mental Status: He is alert and oriented to person, place, and time. Mental status is at baseline.  ?Psychiatric:     ?   Mood and Affect: Mood normal.     ?   Behavior: Behavior normal.  ? ? ?ED Results / Procedures / Treatments   ?Labs ?(all labs ordered are listed, but only abnormal results are displayed) ?Labs Reviewed  ?CBC - Abnormal; Notable for the following components:  ?    Result Value  ? RBC 3.84 (*)   ? Hemoglobin 12.1 (*)   ? HCT 36.3 (*)   ? All other components within normal limits  ?COMPREHENSIVE METABOLIC PANEL - Abnormal; Notable for the following components:  ? Glucose, Bld 546 (*)   ? BUN 41 (*)   ? Creatinine, Ser 1.82 (*)   ? Calcium 8.6 (*)   ? Albumin 3.2 (*)   ? GFR, Estimated 35 (*)   ? All other components within normal limits  ?URINALYSIS, ROUTINE W REFLEX MICROSCOPIC - Abnormal; Notable for the following components:  ? Glucose, UA >=500 (*)   ? Leukocytes,Ua TRACE (*)   ? All other components within normal limits  ?URINALYSIS, MICROSCOPIC (REFLEX) - Abnormal; Notable for the following components:  ? Bacteria, UA RARE (*)   ? All other components within normal limits  ?CBG MONITORING, ED - Abnormal; Notable for the following components:  ? Glucose-Capillary 534 (*)   ? All other components within normal limits  ?TROPONIN I (HIGH SENSITIVITY) - Abnormal; Notable for the following components:  ? Troponin I (High Sensitivity) 505 (*)   ? All other components  within normal limits  ?TROPONIN I (HIGH SENSITIVITY) - Abnormal; Notable for the following components:  ? Troponin I (High Sensitivity) 471 (*)   ? All other components within normal limits  ?RESP PANEL BY RT-PCR (FLU A&B, COVID) ARPGX2  ?MAGNESIUM  ?CK  ? ? ?  EKG ?EKG Interpretation ? ?Date/Time:  Sunday January 20 2022 13:08:57 EDT ?Ventricular Rate:  74 ?PR Interval:    ?QRS Duration: 146 ?QT Interval:  505 ?QTC Calculation: 561 ?R Axis:   15 ?Text Interpretation: Atrial fibrillation Right bundle branch block Borderline ST depression, lateral leads Confirmed by Octaviano Glow [phone]) on 01/20/2022 1:16:20 PM ? ?Radiology ?DG Chest 2 View ? ?Result Date: 01/20/2022 ?CLINICAL DATA:  Evaluate for pneumonia EXAM: CHEST - 2 VIEW COMPARISON:  07/30/2021 FINDINGS: Unchanged cardiac and mediastinal contours. Aortic atherosclerosis. No new focal pulmonary opacity. No pleural effusion pneumothorax. No acute osseous abnormality. IMPRESSION: No acute cardiopulmonary process. Electronically Signed   By: Merilyn Baba M.D.   On: 01/20/2022 12:41  ? ?CT Head Wo Contrast ? ?Result Date: 01/20/2022 ?CLINICAL DATA:  Head trauma.  Moderate to severe. EXAM: CT HEAD WITHOUT CONTRAST TECHNIQUE: Contiguous axial images were obtained from the base of the skull through the vertex without intravenous contrast. RADIATION DOSE REDUCTION: This exam was performed according to the departmental dose-optimization program which includes automated exposure control, adjustment of the mA and/or kV according to patient size and/or use of iterative reconstruction technique. COMPARISON:  04/30/2019 FINDINGS: Brain: No evidence of acute infarction, hemorrhage, hydrocephalus, extra-axial collection or mass lesion/mass effect. There is mild diffuse low-attenuation within the subcortical and periventricular white matter compatible with chronic microvascular disease. Prominence of sulci and ventricles compatible with brain atrophy. Vascular: No hyperdense vessel or  unexpected calcification. Skull: Normal. Negative for fracture or focal lesion. Sinuses/Orbits: Retention cyst versus polyp noted in the right maxillary sinus. No acute abnormality. Other: None. IMPRESSION: 1.

## 2022-01-20 NOTE — ED Triage Notes (Signed)
Pt arrives pov, to room in wheelchair, c/o fall x 3 days pta. Pt reports falling into doorframe, then sitting down to floor. Denies loc, hit posterior head and tailbone. Denies blurred vision.  Pt endorses increasing lower extremity weakness. Bruising and skin tears noted to right forearm, right elbow, and left upper extremity. Unsure if thinners, none noted in patient chart ?

## 2022-01-21 ENCOUNTER — Observation Stay (HOSPITAL_BASED_OUTPATIENT_CLINIC_OR_DEPARTMENT_OTHER): Payer: Medicare PPO

## 2022-01-21 DIAGNOSIS — I214 Non-ST elevation (NSTEMI) myocardial infarction: Secondary | ICD-10-CM | POA: Diagnosis not present

## 2022-01-21 LAB — CBC
HCT: 31.5 % — ABNORMAL LOW (ref 39.0–52.0)
Hemoglobin: 10.4 g/dL — ABNORMAL LOW (ref 13.0–17.0)
MCH: 31.1 pg (ref 26.0–34.0)
MCHC: 33 g/dL (ref 30.0–36.0)
MCV: 94.3 fL (ref 80.0–100.0)
Platelets: 149 10*3/uL — ABNORMAL LOW (ref 150–400)
RBC: 3.34 MIL/uL — ABNORMAL LOW (ref 4.22–5.81)
RDW: 15.5 % (ref 11.5–15.5)
WBC: 7 10*3/uL (ref 4.0–10.5)
nRBC: 0 % (ref 0.0–0.2)

## 2022-01-21 LAB — GLUCOSE, CAPILLARY
Glucose-Capillary: 125 mg/dL — ABNORMAL HIGH (ref 70–99)
Glucose-Capillary: 237 mg/dL — ABNORMAL HIGH (ref 70–99)
Glucose-Capillary: 269 mg/dL — ABNORMAL HIGH (ref 70–99)
Glucose-Capillary: 248 mg/dL — ABNORMAL HIGH (ref 70–99)

## 2022-01-21 LAB — LIPID PANEL
Cholesterol: 73 mg/dL (ref 0–200)
HDL: 37 mg/dL — ABNORMAL LOW (ref >40)
LDL Cholesterol: 25 mg/dL (ref 0–99)
Total CHOL/HDL Ratio: 2 RATIO
Triglycerides: 53 mg/dL (ref <150)
VLDL: 11 mg/dL (ref 0–40)

## 2022-01-21 LAB — BASIC METABOLIC PANEL
Anion gap: 7 (ref 5–15)
BUN: 31 mg/dL — ABNORMAL HIGH (ref 8–23)
CO2: 24 mmol/L (ref 22–32)
Calcium: 8.5 mg/dL — ABNORMAL LOW (ref 8.9–10.3)
Chloride: 107 mmol/L (ref 98–111)
Creatinine, Ser: 1.3 mg/dL — ABNORMAL HIGH (ref 0.61–1.24)
GFR, Estimated: 52 mL/min — ABNORMAL LOW (ref >60)
Glucose, Bld: 215 mg/dL — ABNORMAL HIGH (ref 70–99)
Potassium: 3.3 mmol/L — ABNORMAL LOW (ref 3.5–5.1)
Sodium: 138 mmol/L (ref 135–145)

## 2022-01-21 LAB — ECHOCARDIOGRAM COMPLETE
AR max vel: 2.38 cm2
AV Area VTI: 2.28 cm2
AV Area mean vel: 2.2 cm2
AV Mean grad: 5 mmHg
AV Peak grad: 8.9 mmHg
Ao pk vel: 1.49 m/s
Area-P 1/2: 2.71 cm2
Calc EF: 70.3 %
Height: 72 in
MV VTI: 2.48 cm2
P 1/2 time: 308 msec
S' Lateral: 3.1 cm
Single Plane A2C EF: 67.5 %
Single Plane A4C EF: 69 %
Weight: 3107.6 oz

## 2022-01-21 LAB — HEMOGLOBIN A1C
Hgb A1c MFr Bld: 12.8 % — ABNORMAL HIGH (ref 4.8–5.6)
Mean Plasma Glucose: 321 mg/dL

## 2022-01-21 MED ORDER — PERFLUTREN LIPID MICROSPHERE
1.0000 mL | INTRAVENOUS | Status: AC | PRN
  Administered 2022-01-21: 2 mL via INTRAVENOUS
  Filled 2022-01-21: qty 10

## 2022-01-21 MED ORDER — FENOFIBRATE 160 MG PO TABS
160.0000 mg | ORAL_TABLET | Freq: Every day | ORAL | Status: DC
  Administered 2022-01-22: 160 mg via ORAL
  Filled 2022-01-21: qty 1

## 2022-01-21 NOTE — Progress Notes (Signed)
?PROGRESS NOTE ? ?[name]  ?DOB: 1930/04/17  ?PCP: [name], MD ?MGN:[id number]  ?DOA: 01/20/2022 ? LOS: 0 days  ?Hospital Day: 2 ? ?Brief narrative: ?[name] is a 88 y.o. male with PMH significant for T2DM, HTN, HLD, paroxysmal A-fib ?Patient presented to the ED on 4/2 with complaint of fall and weakness. ?A week ago, patient took a shower and felt like his legs were weak.  While walking out of the bathroom, his legs gave away and he fell down on his knees and then hit his bottom and head.  He had a skin tears in his right arm, tailbone.  He progressively got weaker and has been hard to get out of bed. ? ?Hemodynamically stable ?Blood glucose level initially was elevated over 500, creatinine elevated 1.82 higher than baseline, troponin elevated to 500 ?Chest x-Korbyn unremarkable ?CTA did not show any acute finding. ?Admitted to hospitalist service ?Cardiology was consulted. ? ?Subjective: ?Patient was seen and examined this morning. ?Pleasant, elderly Caucasian male.  Hard of hearing.  Propped up in bed.  Not in distress.  No new symptoms.  Not on supplemental oxygen. ? ?Principal Problem: ?  NSTEMI (non-ST elevated myocardial infarction) ([place]) ?Active Problems: ?  Acute renal failure superimposed on stage 3a chronic kidney disease ([place]) ?  AF (paroxysmal atrial fibrillation) ([place]) ?  Type 2 diabetes mellitus without complication, with long-term current use of insulin ([place]) ?  Essential hypertension ?  Hyperlipidemia ?  ? ?Assessment and Plan: ?NSTEMI (non-ST elevated myocardial infarction)  ?History of CAD ?-Primary presented with fall.  Found to have elevated troponin without any chest discomfort.   ?-Echocardiogram did not show any evidence of wall motion abnormality. ?-Cardiology consult obtained.  No need of further ischemia work-up.   ?-Continue aspirin and statin. ?Recent Labs  ?  01/20/22 ?1201 01/20/22 ?1356  ?CKTOTAL  --  101  ?TROPONINIHS 505* 471*  ? ?Permanent A-fib ?-Rate controlled on  Cardizem. ?-Defer to primary cardiologist as an outpatient for anticoagulation.  Presumably he is not on anticoagulation because of falls. ?-Continue to monitor in telemetry ? ?AKI on CKD 3a ?-Likely prerenal AKI because of poor hydration status ?-Improved with IV fluid.  Currently on IV hydration. ?Recent Labs  ?  08/01/21 ?1240 10/27/21 ?1220 01/20/22 ?1136 01/21/22 ?0152  ?BUN 28* 29* 41* 31*  ?CREATININE 1.23 1.42* 1.82* 1.30*  ? ?Hypokalemia ?-Potassium low at 3.3.  Replacement given.  Recheck. ?Recent Labs  ?Lab 01/20/22 ?1136 01/21/22 ?0152  ?K 4.3 3.3*  ?MG 1.9  --   ? ? ?Type 2 diabetes mellitus ?-A1c 7 from 2020 ?-Currently on Semglee 20 units and sliding scale insulin..  Actos held. ?-Blood sugar level gradually improving, over 200 this afternoon ?Recent Labs  ?Lab 01/20/22 ?1700 01/20/22 ?2054 01/21/22 ?0805 01/21/22 ?1201 01/21/22 ?1606  ?GLUCAP 372* 183* 125* 237* 269*  ? ?Hyperlipidemia ?-Continue lipitor '80mg'$   ? ?Essential hypertension ?-Continue Cardizem, Imdur. ?-Holding cozaar with AKI ? ?Goals of care ?  Code Status: DNR  ? ? ?Mobility: Encourage ambulation.  PT eval obtained.  Home health PT recommended ?Nutritional status:  ?Body mass index is 26.34 kg/m?.  ?  ?  ? ? ? ? ?Diet:  ?Diet Order   ? ?       ?  Diet Heart Room service appropriate? Yes with Assist; Fluid consistency: Thin  Diet effective now       ?  ? ?  ?  ? ?  ? ? ?DVT prophylaxis:  ?enoxaparin (  LOVENOX) injection 40 mg Start: 01/20/22 2200 ?  ?Antimicrobials: None ?Fluid: None ?Consultants: Cardiology ?Family Communication: None at bedside ? ?Status is: Observation ? ?Continue in-hospital care because: Ongoing glucose monitoring, adjustment of medications ?Level of care: Telemetry Cardiac  ? ?Dispo: The patient is from: Home ?             Anticipated d/c is to: Hopefully home in 1 to 2 days ?             Patient currently is not medically stable to d/c. ?  Difficult to place patient No ? ? ? ? ?Infusions:  ? sodium chloride     ? ? ?Scheduled Meds: ? aspirin EC  81 mg Oral Daily  ? atorvastatin  80 mg Oral QPM  ? diltiazem  180 mg Oral Daily  ? enoxaparin (LOVENOX) injection  40 mg Subcutaneous Q24H  ? [START ON 01/22/2022] fenofibrate  160 mg Oral Daily  ? gabapentin  300 mg Oral BID  ? insulin aspart  0-9 Units Subcutaneous TID WC  ? insulin detemir  20 Units Subcutaneous QHS  ? isosorbide mononitrate  30 mg Oral Daily  ? pantoprazole  40 mg Oral Daily  ? sodium chloride flush  3 mL Intravenous Q12H  ? ? ?PRN meds: ?sodium chloride, acetaminophen, nitroGLYCERIN, ondansetron (ZOFRAN) IV, sodium chloride flush, traZODone  ? ?Antimicrobials: ?Anti-infectives (From admission, onward)  ? ? None  ? ?  ? ? ?Objective: ?Vitals:  ? 01/21/22 0800 01/21/22 1628  ?BP: 127/83 (!) 156/76  ?Pulse:  72  ?Resp: 10 16  ?Temp: 97.9 ?F (36.6 ?C) 98.2 ?F (36.8 ?C)  ?SpO2:  99%  ? ?No intake or output data in the 24 hours ending 01/21/22 1657 ?Filed Weights  ? 01/20/22 1128  ?Weight: 88.1 kg  ? ?Weight change:  ?Body mass index is 26.34 kg/m?.  ? ?Physical Exam: ?General exam: Pleasant elderly Caucasian male.  Not in physical distress ?Skin: No rashes, lesions or ulcers. ?HEENT: Atraumatic, normocephalic, no obvious bleeding ?Lungs: Clear to auscultation bilaterally ?CVS: Rate controlled A-fib  ?GI/Abd soft, nontender, nondistended, bowel sound present ?CNS: Alert, awake, oriented x3.  Hard of hearing ?Psychiatry: Mood appropriate ?Extremities: No pedal edema, no calf tenderness ? ?Data Review: I have personally reviewed the laboratory data and studies available. ? ?F/u labs ordered ?Unresulted Labs (From admission, onward)  ? ?  Start     Ordered  ? 01/22/22 0500  CBC with Differential/Platelet  Tomorrow morning,   R       ? 01/21/22 1657  ? 01/22/22 5400  Basic metabolic panel  Tomorrow morning,   R       ? 01/21/22 1657  ? 01/21/22 0500  Hemoglobin A1c  Tomorrow morning,   R       ?Comments: To assess prior glycemic control ?  ? 01/20/22 1758  ? ?  ?  ? ?   ? ? ?Signed, ?Terrilee Croak, MD ?[facility] ?01/21/2022 ? ? ? ? ? ? ? ? ? ? ? ? ?

## 2022-01-21 NOTE — Progress Notes (Signed)
Mobility Specialist Progress Note  ? ? 01/21/22 1406  ?Mobility  ?Activity Ambulated with assistance in hallway  ?Level of Assistance Minimal assist, patient does 75% or more  ?Assistive Device Front wheel walker  ?Distance Ambulated (ft) 80 ft  ?Activity Response Tolerated well  ?$Mobility charge 1 Mobility  ? ?Pt received in BR after void and agreeable. No complaints. Left in chair with all bell in reach.  ? ?Hildred Alamin ?Mobility Specialist  ?  ?

## 2022-01-21 NOTE — Progress Notes (Incomplete)
? ?Progress Note ? ?Patient Name: [name] ?Date of Encounter: 01/21/2022 ? ?[facility] Cardiologist: Quay Burow, MD  ? ?Subjective  ? ?*** ? ?Inpatient Medications  ?  ?Scheduled Meds: ? aspirin EC  81 mg Oral Daily  ? atorvastatin  80 mg Oral QPM  ? diltiazem  180 mg Oral Daily  ? enoxaparin (LOVENOX) injection  40 mg Subcutaneous Q24H  ? gabapentin  300 mg Oral BID  ? gemfibrozil  600 mg Oral Daily  ? insulin aspart  0-9 Units Subcutaneous TID WC  ? insulin detemir  20 Units Subcutaneous QHS  ? isosorbide mononitrate  30 mg Oral Daily  ? pantoprazole  40 mg Oral Daily  ? sodium chloride flush  3 mL Intravenous Q12H  ? ?Continuous Infusions: ? sodium chloride    ? ?PRN Meds: ?sodium chloride, acetaminophen, nitroGLYCERIN, ondansetron (ZOFRAN) IV, sodium chloride flush, traZODone  ? ?Vital Signs  ?  ?Vitals:  ? 01/20/22 2001 01/20/22 2027 01/21/22 0500 01/21/22 0800  ?BP: (!) 121/55 (!) 121/55 (!) 127/55 127/83  ?Pulse: 66 66 60   ?Resp: '12 12 15 10  '$ ?Temp: 97.8 ?F (36.6 ?C) 97.8 ?F (36.6 ?C) 98 ?F (36.7 ?C) 97.9 ?F (36.6 ?C)  ?TempSrc:  Oral Oral Oral  ?SpO2:  99% 100%   ?Weight:      ?Height:      ? ?No intake or output data in the 24 hours ending 01/21/22 0811 ? ?  01/20/2022  ? 11:28 AM 11/23/2021  ?  1:44 PM 10/27/2021  ? 11:15 AM  ?Last 3 Weights  ?Weight (lbs) 194 lb 3.6 oz 212 lb 210 lb  ?Weight (kg) 88.1 kg 96.163 kg 95.255 kg  ?   ? ?Telemetry  ?  ?*** - Personally Reviewed ? ?ECG  ?  ?*** - Personally Reviewed ? ?Physical Exam  ?*** ?GEN: No acute distress.   ?Neck: No JVD ?Cardiac: RRR, no murmurs, rubs, or gallops.  ?Respiratory: Clear to auscultation bilaterally. ?GI: Soft, nontender, non-distended  ?MS: No edema; No deformity. ?Neuro:  Nonfocal  ?Psych: Normal affect  ? ?Labs  ?  ?High Sensitivity Troponin:   ?Recent Labs  ?Lab 01/20/22 ?1201 01/20/22 ?1356  ?TROPONINIHS 505* 471*  ?   ?Chemistry ?Recent Labs  ?Lab 01/20/22 ?1136 01/21/22 ?0152  ?NA 136 138  ?K 4.3 3.3*  ?CL 103 107  ?CO2 22 24   ?GLUCOSE 546* 215*  ?BUN 41* 31*  ?CREATININE 1.82* 1.30*  ?CALCIUM 8.6* 8.5*  ?MG 1.9  --   ?PROT 6.6  --   ?ALBUMIN 3.2*  --   ?AST 25  --   ?ALT 21  --   ?ALKPHOS 115  --   ?BILITOT 0.9  --   ?GFRNONAA 35* 52*  ?ANIONGAP 11 7  ?  ?Lipids  ?Recent Labs  ?Lab 01/21/22 ?0152  ?CHOL 73  ?TRIG 53  ?HDL 37*  ?[place] 25  ?CHOLHDL 2.0  ?  ?Hematology ?Recent Labs  ?Lab 01/20/22 ?1136 01/21/22 ?0152  ?WBC 8.4 7.0  ?RBC 3.84* 3.34*  ?HGB 12.1* 10.4*  ?HCT 36.3* 31.5*  ?MCV 94.5 94.3  ?MCH 31.5 31.1  ?MCHC 33.3 33.0  ?RDW 15.4 15.5  ?PLT 171 149*  ? ?Thyroid No results for input(s): TSH, FREET4 in the last 168 hours.  ?BNPNo results for input(s): BNP, PROBNP in the last 168 hours.  ?DDimer No results for input(s): DDIMER in the last 168 hours.  ? ?Radiology  ?  ?DG Chest 2 View ? ?Result  Date: 01/20/2022 ?CLINICAL DATA:  Evaluate for pneumonia EXAM: CHEST - 2 VIEW COMPARISON:  07/30/2021 FINDINGS: Unchanged cardiac and mediastinal contours. Aortic atherosclerosis. No new focal pulmonary opacity. No pleural effusion pneumothorax. No acute osseous abnormality. IMPRESSION: No acute cardiopulmonary process. Electronically Signed   By: Merilyn Baba M.D.   On: 01/20/2022 12:41  ? ?CT Head Wo Contrast ? ?Result Date: 01/20/2022 ?CLINICAL DATA:  Head trauma.  Moderate to severe. EXAM: CT HEAD WITHOUT CONTRAST TECHNIQUE: Contiguous axial images were obtained from the base of the skull through the vertex without intravenous contrast. RADIATION DOSE REDUCTION: This exam was performed according to the departmental dose-optimization program which includes automated exposure control, adjustment of the mA and/or kV according to patient size and/or use of iterative reconstruction technique. COMPARISON:  04/30/2019 FINDINGS: Brain: No evidence of acute infarction, hemorrhage, hydrocephalus, extra-axial collection or mass lesion/mass effect. There is mild diffuse low-attenuation within the subcortical and periventricular white matter  compatible with chronic microvascular disease. Prominence of sulci and ventricles compatible with brain atrophy. Vascular: No hyperdense vessel or unexpected calcification. Skull: Normal. Negative for fracture or focal lesion. Sinuses/Orbits: Retention cyst versus polyp noted in the right maxillary sinus. No acute abnormality. Other: None. IMPRESSION: 1. No acute intracranial abnormalities. 2. Chronic small vessel ischemic disease and brain atrophy. Electronically Signed   By: Kerby Moors M.D.   On: 01/20/2022 12:38   ? ?Cardiac Studies  ? ?*** ? ?Patient Profile  ? ?88 y.o. male with PMH of Chronic CAD, Permanent atrial fibrillation, Severe pulmonary hypertension, CKD IIIa, type 2 DM, HTN, carotid artery disease s/p R CEA, who is admitted for fall and weakness, fould to have elevated trop 500s and AKI on CKD and severe hyperglycemia.  Cardiology is consulted and following since 01/21/21 for elevated trop. ? ?Assessment & Plan  ?  ?Troponin elevation  ?Chronic obstructive CAD  ?- follows Dr Gwenlyn Found outpatient, TTE and CCTA from 07/2021 revealing mild biventricular hypertrophy without dilation and normal systolic function, severe PH with high RAP, and mild-moderate AI, as well as severe proximal OM1 stenosis; the LAD had tapering flow limitation by FFR but no discrete stenosis. Has been managed medically with Imdur and remained chest pain free.  ?- presented with weakness and falls for 2-3 weeks.  ?- admission labs with glucose 534, Cr 1.8 from baseline 1.2-1.4, trop 505 >471  ?- admission EKG without acute ischemic changes  ?- no angina symptoms reported  ?- pending TTE, A1C, LDL 25  ?- unlikely NSTEMI, suspect trop elevation due to AKI on CKD  ?- continue home medical therapy with Imdur, CCB, continue statin and ASA ? ?Permanent atrial fibrillation ?- diagnosed in 2013, seen by Dr Meda Coffee 05/2019, no anticoagulation at the time due to high risk of bleeding due to hx of gastritis on EGD from 06/08/19 (pending GI  clearance ) and falls and advanced age ?- continue rate control regimen strategy with Cardizem , seems well controlled ?- would not start anticoagulation  ? ? ?{Are we signing off today?:[id number]} ? ?For questions or updates, please contact [place] ?Please consult [email or web address] for contact info under  ? ?  ?   ?Signed, ?Margie Billet, NP  ?01/21/2022, 8:11 AM   ? ?

## 2022-01-21 NOTE — Progress Notes (Signed)
Ok to change gemfibrozil to fenofibrate to reduce the risk of rhabdo per Dr Pietro Cassis. ? ?Onnie Boer, PharmD, BCIDP, AAHIVP, CPP ?Infectious Disease Pharmacist ?01/21/2022 2:57 PM ? ? ?

## 2022-01-21 NOTE — Progress Notes (Signed)
?  Echocardiogram ?2D Echocardiogram has been performed. ? [name] ?01/21/2022, 9:58 AM ?

## 2022-01-21 NOTE — Progress Notes (Signed)
? ?Progress Note ? ?Patient Name: [name] ?Date of Encounter: 01/21/2022 ? ?Primary Cardiologist: Quay Burow, MD  ? ?Subjective  ? ?Patient was seen and examined at his bedside.  He is awake when I arrived.  He was getting his echocardiogram.  He tells me he did not get much sleep last night but does not report any other symptoms. ? ?Inpatient Medications  ?  ?Scheduled Meds: ? aspirin EC  81 mg Oral Daily  ? atorvastatin  80 mg Oral QPM  ? diltiazem  180 mg Oral Daily  ? enoxaparin (LOVENOX) injection  40 mg Subcutaneous Q24H  ? gabapentin  300 mg Oral BID  ? gemfibrozil  600 mg Oral Daily  ? insulin aspart  0-9 Units Subcutaneous TID WC  ? insulin detemir  20 Units Subcutaneous QHS  ? isosorbide mononitrate  30 mg Oral Daily  ? pantoprazole  40 mg Oral Daily  ? sodium chloride flush  3 mL Intravenous Q12H  ? ?Continuous Infusions: ? sodium chloride    ? ?PRN Meds: ?sodium chloride, acetaminophen, nitroGLYCERIN, ondansetron (ZOFRAN) IV, perflutren lipid microspheres (DEFINITY) IV suspension, sodium chloride flush, traZODone  ? ?Vital Signs  ?  ?Vitals:  ? 01/20/22 2001 01/20/22 2027 01/21/22 0500 01/21/22 0800  ?BP: (!) 121/55 (!) 121/55 (!) 127/55 127/83  ?Pulse: 66 66 60   ?Resp: '12 12 15 10  '$ ?Temp: 97.8 ?F (36.6 ?C) 97.8 ?F (36.6 ?C) 98 ?F (36.7 ?C) 97.9 ?F (36.6 ?C)  ?TempSrc:  Oral Oral Oral  ?SpO2:  99% 100%   ?Weight:      ?Height:      ? ?No intake or output data in the 24 hours ending 01/21/22 1059 ?Filed Weights  ? 01/20/22 1128  ?Weight: 88.1 kg  ? ? ?Telemetry  ?  ?Atrial fibrillation- Personally Reviewed ? ?ECG  ?  ?Atrial fibrillation with controlled ventricular rate and right bundle branch block- Personally Reviewed ? ?Physical Exam  ?  ?General: Comfortable ?Head: Atraumatic, normal size  ?Eyes: PEERLA, EOMI  ?Neck: Supple, normal JVD ?Cardiac: Normal S1, S2; RRR; no murmurs, rubs, or gallops ?Lungs: Clear to auscultation bilaterally ?Abd: Soft, nontender, no hepatomegaly  ?Ext: warm, no  edema ?Musculoskeletal: No deformities, BUE and BLE strength normal and equal ?Skin: Warm and dry, no rashes   ?Neuro: Alert and oriented to person, place, time, and situation, CNII-XII grossly intact, no focal deficits  ?Psych: Normal mood and affect  ? ?Labs  ?  ?Chemistry ?Recent Labs  ?Lab 01/20/22 ?1136 01/21/22 ?0152  ?NA 136 138  ?K 4.3 3.3*  ?CL 103 107  ?CO2 22 24  ?GLUCOSE 546* 215*  ?BUN 41* 31*  ?CREATININE 1.82* 1.30*  ?CALCIUM 8.6* 8.5*  ?PROT 6.6  --   ?ALBUMIN 3.2*  --   ?AST 25  --   ?ALT 21  --   ?ALKPHOS 115  --   ?BILITOT 0.9  --   ?GFRNONAA 35* 52*  ?ANIONGAP 11 7  ?  ? ?Hematology ?Recent Labs  ?Lab 01/20/22 ?1136 01/21/22 ?0152  ?WBC 8.4 7.0  ?RBC 3.84* 3.34*  ?HGB 12.1* 10.4*  ?HCT 36.3* 31.5*  ?MCV 94.5 94.3  ?MCH 31.5 31.1  ?MCHC 33.3 33.0  ?RDW 15.4 15.5  ?PLT 171 149*  ? ? ?Cardiac EnzymesNo results for input(s): TROPONINI in the last 168 hours. No results for input(s): TROPIPOC in the last 168 hours.  ? ?BNPNo results for input(s): BNP, PROBNP in the last 168 hours.  ? ?DDimer No results  for input(s): DDIMER in the last 168 hours.  ? ?Radiology  ?  ?DG Chest 2 View ? ?Result Date: 01/20/2022 ?CLINICAL DATA:  Evaluate for pneumonia EXAM: CHEST - 2 VIEW COMPARISON:  07/30/2021 FINDINGS: Unchanged cardiac and mediastinal contours. Aortic atherosclerosis. No new focal pulmonary opacity. No pleural effusion pneumothorax. No acute osseous abnormality. IMPRESSION: No acute cardiopulmonary process. Electronically Signed   By: Merilyn Baba M.D.   On: 01/20/2022 12:41  ? ?CT Head Wo Contrast ? ?Result Date: 01/20/2022 ?CLINICAL DATA:  Head trauma.  Moderate to severe. EXAM: CT HEAD WITHOUT CONTRAST TECHNIQUE: Contiguous axial images were obtained from the base of the skull through the vertex without intravenous contrast. RADIATION DOSE REDUCTION: This exam was performed according to the departmental dose-optimization program which includes automated exposure control, adjustment of the mA and/or kV  according to patient size and/or use of iterative reconstruction technique. COMPARISON:  04/30/2019 FINDINGS: Brain: No evidence of acute infarction, hemorrhage, hydrocephalus, extra-axial collection or mass lesion/mass effect. There is mild diffuse low-attenuation within the subcortical and periventricular white matter compatible with chronic microvascular disease. Prominence of sulci and ventricles compatible with brain atrophy. Vascular: No hyperdense vessel or unexpected calcification. Skull: Normal. Negative for fracture or focal lesion. Sinuses/Orbits: Retention cyst versus polyp noted in the right maxillary sinus. No acute abnormality. Other: None. IMPRESSION: 1. No acute intracranial abnormalities. 2. Chronic small vessel ischemic disease and brain atrophy. Electronically Signed   By: Kerby Moors M.D.   On: 01/20/2022 12:38  ? ?ECHOCARDIOGRAM COMPLETE ? ?Result Date: 01/21/2022 ?   ECHOCARDIOGRAM REPORT   Patient Name:   [name]  Date of Exam: 01/21/2022 Medical Rec #:  [id number]  Height:       72.0 in Accession #:    [id number] Weight:       194.2 lb Date of Birth:  08-31-1930   BSA:          2.104 m? Patient Age:    88 years   BP:           127/83 mmHg Patient Gender: M          HR:           70 bpm. Exam Location:  Inpatient Procedure: 2D Echo, Cardiac Doppler, Color Doppler and Intracardiac            Opacification Agent Indications:    NSTEMI I21.4  History:        Patient has prior history of Echocardiogram examinations, most                 recent 08/09/2021. Aortic Valve Disease and Mitral Valve                 Disease; Risk Factors:Hypertension, Diabetes and Dyslipidemia.                 Chronic kidney disease.  Sonographer:    Darlina Sicilian RDCS Referring Phys: [id number] [place]  1. Left ventricular ejection fraction, by estimation, is 60 to 65%. The left ventricle has normal function. The left ventricle has no regional wall motion abnormalities. Left ventricular diastolic function  could not be evaluated. Elevated left atrial pressure.  2. Right ventricular systolic function is normal. The right ventricular size is normal. There is mildly elevated pulmonary artery systolic pressure.  3. Left atrial size was mildly dilated.  4. The mitral valve is abnormal. Mild mitral valve regurgitation. No evidence of mitral stenosis. Moderate mitral annular calcification.  5. The  aortic valve is calcified. Aortic valve regurgitation is mild. No aortic stenosis is present.  6. Aortic dilatation noted. There is borderline dilatation of the aortic root, measuring 39 mm.  7. The inferior vena cava is dilated in size with >50% respiratory variability, suggesting right atrial pressure of 8 mmHg. FINDINGS  Left Ventricle: Left ventricular ejection fraction, by estimation, is 60 to 65%. The left ventricle has normal function. The left ventricle has no regional wall motion abnormalities. Definity contrast agent was given IV to delineate the left ventricular  endocardial borders. The left ventricular internal cavity size was normal in size. There is no left ventricular hypertrophy. Left ventricular diastolic function could not be evaluated due to atrial fibrillation. Left ventricular diastolic function could  not be evaluated. Elevated left atrial pressure. Right Ventricle: The right ventricular size is normal. No increase in right ventricular wall thickness. Right ventricular systolic function is normal. There is mildly elevated pulmonary artery systolic pressure. The tricuspid regurgitant velocity is 2.89  m/s, and with an assumed right atrial pressure of 8 mmHg, the estimated right ventricular systolic pressure is 25.0 mmHg. Left Atrium: Left atrial size was mildly dilated. Right Atrium: Right atrial size was normal in size. Pericardium: There is no evidence of pericardial effusion. Mitral Valve: The mitral valve is abnormal. There is moderate calcification of the mitral valve leaflet(s). Moderate mitral annular  calcification. Mild mitral valve regurgitation. No evidence of mitral valve stenosis. MV peak gradient, 6.8 mmHg. The mean  mitral valve gradient is 2.8 mmHg. Tricuspid Valve: The tricuspid valve is normal in

## 2022-01-21 NOTE — Care Management Obs Status (Signed)
MEDICARE OBSERVATION STATUS NOTIFICATION ? ? ?Patient Details  ?Name: [name] ?MRN: [id number] ?Date of Birth: [birth year] ? ? ?Medicare Observation Status Notification Given:  Yes ? ? ? ?Bethena Roys, RN ?01/21/2022, 4:39 PM ?

## 2022-01-21 NOTE — TOC Initial Note (Signed)
Transition of Care (TOC) - Initial/Assessment Note  ? ? ?Patient Details  ?Name: [name] ?MRN: [id number] ?Date of Birth: January 16, 1930 ? ?Transition of Care (TOC) CM/SW Contact:    ?Graves-Bigelow, Ocie Cornfield, RN ?Phone Number: ?01/21/2022, 4:44 PM ? ?Clinical Narrative:  Case Manager spoke with patient regarding home health services. Plan will be to return home with [facility] via [facility]. Spouse uses [facility] and has had good results. Referral submitted to [facility] and they can accept the patient. Start of care to begin within 24-48 hours post transition home. No DME needs identified at this time. Family to provide transportation home. No further needs identified at this time.               ? ? ?Expected Discharge Plan: [place] ?Barriers to Discharge: Continued Medical Work up ? ? ?Patient Goals and CMS Choice ?Patient states their goals for this hospitalization and ongoing recovery are:: to return home once stable. ?  ?Choice offered to / list presented to : Patient, Adult Children (daughter specifically asked for [facility]) ? ?Expected Discharge Plan and Services ?Expected Discharge Plan: [place] ?In-house Referral: NA ?Discharge Planning Services: CM Consult ?Post Acute Care Choice: Home Health ?Living arrangements for the past 2 months: [place] ?                ?DME Arranged: N/A ?DME Agency: NA ?  ?  ?  ?HH Arranged: PT ?[place] Agency: [place] ?Date HH Agency Contacted: 01/21/22 ?Time [place]: 2119 ?Representative spoke with at [place]: Tommi Rumps ? ?Prior Living Arrangements/Services ?Living arrangements for the past 2 months: [place] ?Lives with:: Spouse ?Patient language and need for interpreter reviewed:: Yes ?Do you feel safe going back to the place where you live?: Yes      ?Need for Family Participation in Patient Care: Yes (Comment) ?Care giver support system in place?: Yes (comment) ?Current home services: DME (patient has a rolling  walker) ?Criminal Activity/Legal Involvement Pertinent to Current Situation/Hospitalization: No - Comment as needed ? ?Permission Sought/Granted ?Permission sought to share information with : Family Supports, Case Freight forwarder, Customer service manager ?Permission granted to share information with : Yes, Verbal Permission Granted ?   ? Permission granted to share info w AGENCY: Alvis [facility]mings ?   ?   ? ?Emotional Assessment ?Appearance:: Appears stated age ?Attitude/Demeanor/Rapport: Engaged ?Affect (typically observed): Appropriate ?Orientation: : Oriented to Situation, Oriented to  Time, Oriented to Place, Oriented to Self ?Alcohol / Substance Use: Not Applicable ?Psych Involvement: No (comment) ? ?Admission diagnosis:  Weakness [R53.1] ?Hyperglycemia [R73.9] ?NSTEMI (non-ST elevated myocardial infarction) ([place]) [I21.4] ?Patient Active Problem List  ? Diagnosis Date Noted  ? NSTEMI (non-ST elevated myocardial infarction) ([place]) 01/20/2022  ? Acute renal failure superimposed on stage 3a chronic kidney disease ([place]) 01/20/2022  ? Type 2 diabetes mellitus without complication, with long-term current use of insulin ([place]) 01/20/2022  ? Chest pain of uncertain etiology 41/74/0814  ? Gastritis 06/08/2019  ? Acute kidney injury ([place]) 06/08/2019  ? Pancreatic lesion 06/07/2019  ? Abdominal pain 06/06/2019  ? AF (paroxysmal atrial fibrillation) ([place]) 06/06/2019  ? Dehydration, mild 06/06/2019  ? Essential hypertension 11/05/2013  ? Hyperlipidemia 11/05/2013  ? Diabetes ([place]) 11/05/2013  ? Aftercare following surgery of the circulatory system, [place] 06/30/2013  ? Primary osteoarthritis of right knee 11/18/2012  ? Occlusion and stenosis of carotid artery without mention of cerebral infarction 06/30/2012  ? ?PCP:  [name],  MD ?Pharmacy:   ?[id number] [place], [place] - [address] ?[address] ?[place] [address]?Phone: [phone]: [phone]? ? ?Readmission Risk  Interventions ?   ? View : No data to display.  ?  ?  ?  ? ? ? ?

## 2022-01-21 NOTE — Evaluation (Signed)
Physical Therapy Evaluation Patient Details Name: [name] MRN: [id number] DOB: 1930/08/19 Today's Date: 01/21/2022  History of Present Illness  88 y.o. male presenting to ED 4/2 with weakness after fall 3 days prior. Found to have NSTEMI, acute renal failure on Stage 3a CKD  PMH: PAF, T2DM, HTN, HLD.  Clinical Impression  PTA pt living with wife in single story home with ramped entrance. Pt reports wife has CNA 2x/day for colostomy care but that otherwise he takes care of her, keeping up the house. Pt reports ambulating with a RW, however it does not fit into his bathroom and his initial fall was after taking a shower and walking out of the bathroom. Pt is currently limited in safe mobility by generalized soreness after fall in particular R shoulder and knee, in presence of decrease R LE strength and ROM, decreased balance and endurance. Pt is mod A for bed mobility and min A for transfers and ambulation of 40 feet with RW. Pt will require nearly 24 hour assist for mobilization at home. Pt reports daughter is at home taking care of wife but he is unsure how long she can stay. PT recommending HHPT at discharge. PT will continue to follow acutely.       Recommendations for follow up therapy are one component of a multi-disciplinary discharge planning process, led by the attending physician.  Recommendations may be updated based on patient status, additional functional criteria and insurance authorization.  Follow Up Recommendations Home health PT    Assistance Recommended at Discharge Frequent or constant Supervision/Assistance  Patient can return home with the following  A little help with walking and/or transfers;A little help with bathing/dressing/bathroom;Assistance with cooking/housework;Assist for transportation;Help with stairs or ramp for entrance    Equipment Recommendations None recommended by PT  Recommendations for Other Services  OT consult    Functional Status Assessment Patient  has had a recent decline in their functional status and demonstrates the ability to make significant improvements in function in a reasonable and predictable amount of time.     Precautions / Restrictions Precautions Precautions: Fall Precaution Comments: fall prior to hospitalization Restrictions Weight Bearing Restrictions: No      Mobility  Bed Mobility Overal bed mobility: Needs Assistance Bed Mobility: Rolling, Sidelying to Sit Rolling: Supervision Sidelying to sit: Mod assist       General bed mobility comments: supervision for coming over onto his L side, able to move LE off bed but requires modA to bring trunk to upright due to decreased core and UE strength, increased effort and time to scoot hips to EoB    Transfers Overall transfer level: Needs assistance Equipment used: Rolling walker (2 wheels) Transfers: Sit to/from Stand Sit to Stand: Min assist           General transfer comment: pt able to intiate power up but has poor knee extension R>L requiring increased time and effort to come to upright    Ambulation/Gait Ambulation/Gait assistance: Min assist Gait Distance (Feet): 40 Feet Assistive device: Rolling walker (2 wheels) Gait Pattern/deviations: Step-through pattern, Decreased step length - right, Decreased step length - left, Knee flexed in stance - right, Knee flexed in stance - left, Knees buckling, Shuffle, Trunk flexed Gait velocity: slowed Gait velocity interpretation: <1.8 ft/sec, indicate of risk for recurrent falls   General Gait Details: min A for steadying, ambulates in flexed posture, with bilateral knee flexion and R sided buckling with fatigue        Balance Overall balance  assessment: Needs assistance Sitting-balance support: No upper extremity supported, Feet supported Sitting balance-Leahy Scale: Fair     Standing balance support: Single extremity supported, Bilateral upper extremity supported, During functional activity,  Reliant on assistive device for balance Standing balance-Leahy Scale: Poor Standing balance comment: requires at least single UE support                             Pertinent Vitals/Pain Pain Assessment Pain Assessment: Faces Faces Pain Scale: Hurts even more Pain Location: R forearm skin tear, L shoulder, R knee, generalized Pain Descriptors / Indicators: Moaning, Grimacing, Guarding, Discomfort Pain Intervention(s): Limited activity within patient's tolerance, Monitored during session, Repositioned    Home Living Family/patient expects to be discharged to:: Private residence Living Arrangements: Spouse/significant other Available Help at Discharge: Family;Available PRN/intermittently Type of Home: House Home Access: Ramped entrance       Home Layout: One level Home Equipment: Agricultural consultant (2 wheels);Shower seat - built in;Grab bars - tub/shower;Hand held shower head Additional Comments: pt has tub bath and RW does not fit in his bathroom advised to use wife's where there is a walk in shower and he can use his RW    Prior Function Prior Level of Function : Independent/Modified Independent             Mobility Comments: ambulates with RW ADLs Comments: independent in ADL, takes care of wife,     Hand Dominance   Dominant Hand: Right    Extremity/Trunk Assessment   Upper Extremity Assessment Upper Extremity Assessment: RUE deficits/detail;LUE deficits/detail RUE Deficits / Details: R shoulder pain with movement, decreased shoulder flexion, generalized weakness LUE Deficits / Details: AROM WFL, strength grossly 3+/5    Lower Extremity Assessment Lower Extremity Assessment: RLE deficits/detail;LLE deficits/detail RLE Deficits / Details: R knee lacks full extension, extension strength 2+/5 LLE Deficits / Details: ROM WFL, strength grossly 3+/5    Cervical / Trunk Assessment Cervical / Trunk Assessment: Kyphotic  Communication   Communication: HOH   Cognition Arousal/Alertness: Awake/alert Behavior During Therapy: WFL for tasks assessed/performed Overall Cognitive Status: Impaired/Different from baseline Area of Impairment: Safety/judgement                         Safety/Judgement: Decreased awareness of safety, Decreased awareness of deficits     General Comments: decreased awareness of his level of safety, especially in terms of his need for someone to be able to be with him when he is getting around in his home        General Comments General comments (skin integrity, edema, etc.): VSS on RA,        Assessment/Plan    PT Assessment Patient needs continued PT services  PT Problem List Decreased strength;Decreased range of motion;Decreased activity tolerance;Decreased balance;Decreased mobility;Decreased coordination;Decreased cognition;Decreased safety awareness;Cardiopulmonary status limiting activity;Decreased skin integrity;Pain       PT Treatment Interventions DME instruction;Gait training;Functional mobility training;Therapeutic activities;Therapeutic exercise;Balance training;Cognitive remediation;Patient/family education    PT Goals (Current goals can be found in the Care Plan section)  Acute Rehab PT Goals Patient Stated Goal: get home to his wife PT Goal Formulation: With patient Time For Goal Achievement: 02/04/22 Potential to Achieve Goals: Fair    Frequency Min 3X/week        AM-PAC PT "6 Clicks" Mobility  Outcome Measure Help needed turning from your back to your side while in a flat bed without using bedrails?: None  Help needed moving from lying on your back to sitting on the side of a flat bed without using bedrails?: A Lot Help needed moving to and from a bed to a chair (including a wheelchair)?: A Little Help needed standing up from a chair using your arms (e.g., wheelchair or bedside chair)?: A Little Help needed to walk in hospital room?: A Little Help needed climbing 3-5 steps with  a railing? : Total 6 Click Score: 16    End of Session Equipment Utilized During Treatment: Gait belt Activity Tolerance: Patient tolerated treatment well Patient left: in chair;with call bell/phone within reach;with chair alarm set Nurse Communication: Mobility status;Other (comment) (assist with ordering lunch) PT Visit Diagnosis: Unsteadiness on feet (R26.81);Other abnormalities of gait and mobility (R26.89);Repeated falls (R29.6);Muscle weakness (generalized) (M62.81);History of falling (Z91.81);Difficulty in walking, not elsewhere classified (R26.2);Pain Pain - Right/Left: Right Pain - part of body: Shoulder;Knee    Time: 1308-6578 PT Time Calculation (min) (ACUTE ONLY): 31 min   Charges:   PT Evaluation $PT Eval Moderate Complexity: 1 Mod PT Treatments $Therapeutic Activity: 8-22 mins        [place] Hippert B. Beverely Risen PT, DPT Acute Rehabilitation Services Pager [phone] Office [phone]   [name] 01/21/2022, 1:51 PM

## 2022-01-22 DIAGNOSIS — E876 Hypokalemia: Secondary | ICD-10-CM | POA: Diagnosis present

## 2022-01-22 DIAGNOSIS — I4821 Permanent atrial fibrillation: Secondary | ICD-10-CM | POA: Diagnosis present

## 2022-01-22 DIAGNOSIS — Z7982 Long term (current) use of aspirin: Secondary | ICD-10-CM | POA: Diagnosis not present

## 2022-01-22 DIAGNOSIS — E785 Hyperlipidemia, unspecified: Secondary | ICD-10-CM | POA: Diagnosis present

## 2022-01-22 DIAGNOSIS — Z794 Long term (current) use of insulin: Secondary | ICD-10-CM | POA: Diagnosis not present

## 2022-01-22 DIAGNOSIS — H9193 Unspecified hearing loss, bilateral: Secondary | ICD-10-CM | POA: Diagnosis present

## 2022-01-22 DIAGNOSIS — I272 Pulmonary hypertension, unspecified: Secondary | ICD-10-CM | POA: Diagnosis present

## 2022-01-22 DIAGNOSIS — I214 Non-ST elevation (NSTEMI) myocardial infarction: Secondary | ICD-10-CM | POA: Diagnosis not present

## 2022-01-22 DIAGNOSIS — I129 Hypertensive chronic kidney disease with stage 1 through stage 4 chronic kidney disease, or unspecified chronic kidney disease: Secondary | ICD-10-CM | POA: Diagnosis present

## 2022-01-22 DIAGNOSIS — E1165 Type 2 diabetes mellitus with hyperglycemia: Secondary | ICD-10-CM | POA: Diagnosis present

## 2022-01-22 DIAGNOSIS — Z87891 Personal history of nicotine dependence: Secondary | ICD-10-CM | POA: Diagnosis not present

## 2022-01-22 DIAGNOSIS — N179 Acute kidney failure, unspecified: Secondary | ICD-10-CM | POA: Diagnosis present

## 2022-01-22 DIAGNOSIS — Z79899 Other long term (current) drug therapy: Secondary | ICD-10-CM | POA: Diagnosis not present

## 2022-01-22 DIAGNOSIS — R531 Weakness: Secondary | ICD-10-CM | POA: Diagnosis present

## 2022-01-22 DIAGNOSIS — I21A1 Myocardial infarction type 2: Secondary | ICD-10-CM | POA: Diagnosis present

## 2022-01-22 DIAGNOSIS — Z96651 Presence of right artificial knee joint: Secondary | ICD-10-CM | POA: Diagnosis present

## 2022-01-22 DIAGNOSIS — Z20822 Contact with and (suspected) exposure to covid-19: Secondary | ICD-10-CM | POA: Diagnosis present

## 2022-01-22 DIAGNOSIS — I25118 Atherosclerotic heart disease of native coronary artery with other forms of angina pectoris: Secondary | ICD-10-CM | POA: Diagnosis present

## 2022-01-22 DIAGNOSIS — Y92002 Bathroom of unspecified non-institutional (private) residence single-family (private) house as the place of occurrence of the external cause: Secondary | ICD-10-CM | POA: Diagnosis not present

## 2022-01-22 DIAGNOSIS — Z8249 Family history of ischemic heart disease and other diseases of the circulatory system: Secondary | ICD-10-CM | POA: Diagnosis not present

## 2022-01-22 DIAGNOSIS — E1122 Type 2 diabetes mellitus with diabetic chronic kidney disease: Secondary | ICD-10-CM | POA: Diagnosis present

## 2022-01-22 DIAGNOSIS — I4892 Unspecified atrial flutter: Secondary | ICD-10-CM | POA: Diagnosis present

## 2022-01-22 DIAGNOSIS — Z66 Do not resuscitate: Secondary | ICD-10-CM | POA: Diagnosis present

## 2022-01-22 DIAGNOSIS — W1830XA Fall on same level, unspecified, initial encounter: Secondary | ICD-10-CM | POA: Diagnosis present

## 2022-01-22 DIAGNOSIS — S41111A Laceration without foreign body of right upper arm, initial encounter: Secondary | ICD-10-CM | POA: Diagnosis present

## 2022-01-22 DIAGNOSIS — I083 Combined rheumatic disorders of mitral, aortic and tricuspid valves: Secondary | ICD-10-CM | POA: Diagnosis present

## 2022-01-22 DIAGNOSIS — N1831 Chronic kidney disease, stage 3a: Secondary | ICD-10-CM | POA: Diagnosis present

## 2022-01-22 LAB — BASIC METABOLIC PANEL
Anion gap: 8 (ref 5–15)
BUN: 27 mg/dL — ABNORMAL HIGH (ref 8–23)
CO2: 24 mmol/L (ref 22–32)
Calcium: 9 mg/dL (ref 8.9–10.3)
Chloride: 109 mmol/L (ref 98–111)
Creatinine, Ser: 1.29 mg/dL — ABNORMAL HIGH (ref 0.61–1.24)
GFR, Estimated: 53 mL/min — ABNORMAL LOW (ref >60)
Glucose, Bld: 205 mg/dL — ABNORMAL HIGH (ref 70–99)
Potassium: 3.7 mmol/L (ref 3.5–5.1)
Sodium: 141 mmol/L (ref 135–145)

## 2022-01-22 LAB — CBC WITH DIFFERENTIAL/PLATELET
Abs Immature Granulocytes: 0.04 10*3/uL (ref 0.00–0.07)
Basophils Absolute: 0 10*3/uL (ref 0.0–0.1)
Basophils Relative: 1 %
Eosinophils Absolute: 0.2 10*3/uL (ref 0.0–0.5)
Eosinophils Relative: 4 %
HCT: 33.1 % — ABNORMAL LOW (ref 39.0–52.0)
Hemoglobin: 10.7 g/dL — ABNORMAL LOW (ref 13.0–17.0)
Immature Granulocytes: 1 %
Lymphocytes Relative: 27 %
Lymphs Abs: 1.7 10*3/uL (ref 0.7–4.0)
MCH: 30.7 pg (ref 26.0–34.0)
MCHC: 32.3 g/dL (ref 30.0–36.0)
MCV: 94.8 fL (ref 80.0–100.0)
Monocytes Absolute: 0.7 10*3/uL (ref 0.1–1.0)
Monocytes Relative: 10 %
Neutro Abs: 3.8 10*3/uL (ref 1.7–7.7)
Neutrophils Relative %: 57 %
Platelets: 159 10*3/uL (ref 150–400)
RBC: 3.49 MIL/uL — ABNORMAL LOW (ref 4.22–5.81)
RDW: 15.3 % (ref 11.5–15.5)
WBC: 6.6 10*3/uL (ref 4.0–10.5)
nRBC: 0 % (ref 0.0–0.2)

## 2022-01-22 LAB — GLUCOSE, CAPILLARY
Glucose-Capillary: 119 mg/dL — ABNORMAL HIGH (ref 70–99)
Glucose-Capillary: 193 mg/dL — ABNORMAL HIGH (ref 70–99)

## 2022-01-22 MED ORDER — INSULIN DETEMIR 100 UNIT/ML ~~LOC~~ SOLN: 20.0000 [IU] | Freq: Every day | SUBCUTANEOUS | 11 refills | Status: DC

## 2022-01-22 MED ORDER — LOSARTAN POTASSIUM 25 MG PO TABS
25.0000 mg | ORAL_TABLET | Freq: Every day | ORAL | 2 refills | Status: DC
Start: 2022-01-22 — End: 2024-03-04

## 2022-01-22 MED ORDER — INSULIN DETEMIR 100 UNIT/ML ~~LOC~~ SOLN
20.0000 [IU] | Freq: Every day | SUBCUTANEOUS | Status: DC
  Filled 2022-01-22: qty 0.2

## 2022-01-22 MED ORDER — ISOSORBIDE MONONITRATE ER 30 MG PO TB24: 60.0000 mg | ORAL_TABLET | Freq: Every day | ORAL | 2 refills | Status: DC

## 2022-01-22 MED ORDER — INSULIN DETEMIR 100 UNIT/ML ~~LOC~~ SOLN: 25.0000 [IU] | Freq: Every day | SUBCUTANEOUS | Status: DC

## 2022-01-22 NOTE — Progress Notes (Addendum)
Inpatient Diabetes Program Recommendations ? ?AACE/ADA: New Consensus Statement on Inpatient Glycemic Control (2015) ? ?Target Ranges:  Prepandial:   less than 140 mg/dL ?     Peak postprandial:   less than 180 mg/dL (1-2 hours) ?     Critically ill patients:  140 - 180 mg/dL  ? ?Lab Results  ?Component Value Date  ? GLUCAP 119 (H) 01/22/2022  ? HGBA1C 12.8 (H) 01/21/2022  ? ? ?Review of Glycemic Control ? Latest Reference Range & Units 01/21/22 12:01 01/21/22 16:06 01/21/22 20:59 01/22/22 07:37  ?Glucose-Capillary 70 - 99 mg/dL 237 (H) 269 (H) 248 (H) 119 (H)  ?(H): Data is abnormally high ?Diabetes history: type 2 DM ?Outpatient Diabetes medications: Levemir 15-30 units QD, Actos 30 mg QD ?Current orders for Inpatient glycemic control: Levemir 20 units QHS, Novolog 0-9 units TID ? ?Inpatient Diabetes Program Recommendations:   ? ?If to remain inpatient, consider adding Novolog 3 units TID (assuming patient is consuming >50% of meals).  ?Attempted to reach patient; no answer x 2. Will reattempt.  ? ?Spoke with patient regarding outpatient diabetes management. Patient verified mediations above. Very inconsistent with dosing; sometimes will take up to 50 units of Levemir if blood sugars >500 mg/dL. Reports, "I have been struggling over the past few months to get this under control." Denies missing doses. Patient feels that hypoglycemia was a contributor to his most recent fall.  ?Reviewed patient's current A1c of 12.8%. Explained what a A1c is and what it measures. Also reviewed goal A1c with patient, importance of good glucose control @ home, and blood sugar goals. Reviewed patho of DM, need for consistent insulin dosing, role of pancreas, previous A1C, duration of Levemir, target goals, risk of mortality with increase hypoglycemia risks, signs and symptoms of hypo vs hyper glycemia, need for consistent regimen, vascular changes and commorbidities.  ?Patient checks blood sugar twice daily. Reviewed when to call MD  and the need for consistent dosing. Patient in agreement.  ?Reviewed importance of being mindful with CHO intake with sugary beverages and at meals.  ?Secure chat sent to MD; in agreement and will plan to speak with patient.  ? ?Thanks, ?Bronson Curb, MSN, RNC-OB ?Diabetes Coordinator ?[phone] (8a-5p) ? ? ? ?Thanks, ?Bronson Curb, MSN, RNC-OB ?Diabetes Coordinator ?[phone] (8a-5p) ? ? ? ? ?

## 2022-01-22 NOTE — Progress Notes (Signed)
Patient c/o left sided chest soreness. Denies pain is radiating. Robert Croak MD paged. Scheduled Imdur given to patient. No new orders at this time. Continue to closely monitor patient.  ?

## 2022-01-22 NOTE — Progress Notes (Signed)
? ?Progress Note ? ?Patient Name: [name] ?Date of Encounter: 01/22/2022 ? ?Primary Cardiologist: Quay Burow, MD  ? ?Subjective  ? ?Patient was seen and examined at his bedside.  He is awake when I arrived.  He tells me this morning he had transient chest pain.  It resolved shortly. ? ? ?Inpatient Medications  ?  ?Scheduled Meds: ? aspirin EC  81 mg Oral Daily  ? atorvastatin  80 mg Oral QPM  ? diltiazem  180 mg Oral Daily  ? enoxaparin (LOVENOX) injection  40 mg Subcutaneous Q24H  ? fenofibrate  160 mg Oral Daily  ? gabapentin  300 mg Oral BID  ? insulin aspart  0-9 Units Subcutaneous TID WC  ? insulin detemir  20 Units Subcutaneous QHS  ? isosorbide mononitrate  30 mg Oral Daily  ? pantoprazole  40 mg Oral Daily  ? sodium chloride flush  3 mL Intravenous Q12H  ? ?Continuous Infusions: ? sodium chloride    ? ?PRN Meds: ?sodium chloride, acetaminophen, nitroGLYCERIN, ondansetron (ZOFRAN) IV, sodium chloride flush, traZODone  ? ?Vital Signs  ?  ?Vitals:  ? 01/21/22 2106 01/22/22 0415 01/22/22 0800 01/22/22 0805  ?BP: (!) 143/53 (!) 119/52  (!) 145/64  ?Pulse: 63 (!) 54 60 62  ?Resp: '13 15 11 12  '$ ?Temp: 98.1 ?F (36.7 ?C) 98.3 ?F (36.8 ?C)    ?TempSrc: Oral Oral    ?SpO2: 100% 100% 100% 100%  ?Weight:      ?Height:      ? ? ?Intake/Output Summary (Last 24 hours) at 01/22/2022 1054 ?Last data filed at 01/21/2022 2115 ?Gross per 24 hour  ?Intake 100 ml  ?Output 450 ml  ?Net -350 ml  ? ?Filed Weights  ? 01/20/22 1128  ?Weight: 88.1 kg  ? ? ?Telemetry  ?  ?Atrial fibrillation- Personally Reviewed ? ?ECG  ?  ?Atrial fibrillation with controlled ventricular rate and right bundle branch block- Personally Reviewed ? ?Physical Exam  ?  ?General: Comfortable ?Head: Atraumatic, normal size  ?Eyes: PEERLA, EOMI  ?Neck: Supple, normal JVD ?Cardiac: Normal S1, S2; RRR; no murmurs, rubs, or gallops ?Lungs: Clear to auscultation bilaterally ?Abd: Soft, nontender, no hepatomegaly  ?Ext: warm, no edema ?Musculoskeletal: No deformities,  BUE and BLE strength normal and equal ?Skin: Warm and dry, no rashes   ?Neuro: Alert and oriented to person, place, time, and situation, CNII-XII grossly intact, no focal deficits  ?Psych: Normal mood and affect  ? ?Labs  ?  ?Chemistry ?Recent Labs  ?Lab 01/20/22 ?1136 01/21/22 ?0152 01/22/22 ?0255  ?NA 136 138 141  ?K 4.3 3.3* 3.7  ?CL 103 107 109  ?CO2 '22 24 24  '$ ?GLUCOSE 546* 215* 205*  ?BUN 41* 31* 27*  ?CREATININE 1.82* 1.30* 1.29*  ?CALCIUM 8.6* 8.5* 9.0  ?PROT 6.6  --   --   ?ALBUMIN 3.2*  --   --   ?AST 25  --   --   ?ALT 21  --   --   ?ALKPHOS 115  --   --   ?BILITOT 0.9  --   --   ?GFRNONAA 35* 52* 53*  ?ANIONGAP '11 7 8  '$ ?  ? ?Hematology ?Recent Labs  ?Lab 01/20/22 ?1136 01/21/22 ?0152 01/22/22 ?0255  ?WBC 8.4 7.0 6.6  ?RBC 3.84* 3.34* 3.49*  ?HGB 12.1* 10.4* 10.7*  ?HCT 36.3* 31.5* 33.1*  ?MCV 94.5 94.3 94.8  ?MCH 31.5 31.1 30.7  ?MCHC 33.3 33.0 32.3  ?RDW 15.4 15.5 15.3  ?PLT 171 149* 159  ? ? ?  Cardiac EnzymesNo results for input(s): TROPONINI in the last 168 hours. No results for input(s): TROPIPOC in the last 168 hours.  ? ?BNPNo results for input(s): BNP, PROBNP in the last 168 hours.  ? ?DDimer No results for input(s): DDIMER in the last 168 hours.  ? ?Radiology  ?  ?DG Chest 2 View ? ?Result Date: 01/20/2022 ?CLINICAL DATA:  Evaluate for pneumonia EXAM: CHEST - 2 VIEW COMPARISON:  07/30/2021 FINDINGS: Unchanged cardiac and mediastinal contours. Aortic atherosclerosis. No new focal pulmonary opacity. No pleural effusion pneumothorax. No acute osseous abnormality. IMPRESSION: No acute cardiopulmonary process. Electronically Signed   By: Merilyn Baba M.D.   On: 01/20/2022 12:41  ? ?CT Head Wo Contrast ? ?Result Date: 01/20/2022 ?CLINICAL DATA:  Head trauma.  Moderate to severe. EXAM: CT HEAD WITHOUT CONTRAST TECHNIQUE: Contiguous axial images were obtained from the base of the skull through the vertex without intravenous contrast. RADIATION DOSE REDUCTION: This exam was performed according to the  departmental dose-optimization program which includes automated exposure control, adjustment of the mA and/or kV according to patient size and/or use of iterative reconstruction technique. COMPARISON:  04/30/2019 FINDINGS: Brain: No evidence of acute infarction, hemorrhage, hydrocephalus, extra-axial collection or mass lesion/mass effect. There is mild diffuse low-attenuation within the subcortical and periventricular white matter compatible with chronic microvascular disease. Prominence of sulci and ventricles compatible with brain atrophy. Vascular: No hyperdense vessel or unexpected calcification. Skull: Normal. Negative for fracture or focal lesion. Sinuses/Orbits: Retention cyst versus polyp noted in the right maxillary sinus. No acute abnormality. Other: None. IMPRESSION: 1. No acute intracranial abnormalities. 2. Chronic small vessel ischemic disease and brain atrophy. Electronically Signed   By: Kerby Moors M.D.   On: 01/20/2022 12:38  ? ?ECHOCARDIOGRAM COMPLETE ? ?Result Date: 01/21/2022 ?   ECHOCARDIOGRAM REPORT   Patient Name:   [name]  Date of Exam: 01/21/2022 Medical Rec #:  [id number]  Height:       72.0 in Accession #:    [id number] Weight:       194.2 lb Date of Birth:  08/30/1930   BSA:          2.104 m? Patient Age:    88 years   BP:           127/83 mmHg Patient Gender: M          HR:           70 bpm. Exam Location:  Inpatient Procedure: 2D Echo, Cardiac Doppler, Color Doppler and Intracardiac            Opacification Agent Indications:    NSTEMI I21.4  History:        Patient has prior history of Echocardiogram examinations, most                 recent 08/09/2021. Aortic Valve Disease and Mitral Valve                 Disease; Risk Factors:Hypertension, Diabetes and Dyslipidemia.                 Chronic kidney disease.  Sonographer:    Darlina Sicilian RDCS Referring Phys: [id number] [place]  1. Left ventricular ejection fraction, by estimation, is 60 to 65%. The left ventricle has  normal function. The left ventricle has no regional wall motion abnormalities. Left ventricular diastolic function could not be evaluated. Elevated left atrial pressure.  2. Right ventricular systolic function is normal. The right ventricular size is normal.  There is mildly elevated pulmonary artery systolic pressure.  3. Left atrial size was mildly dilated.  4. The mitral valve is abnormal. Mild mitral valve regurgitation. No evidence of mitral stenosis. Moderate mitral annular calcification.  5. The aortic valve is calcified. Aortic valve regurgitation is mild. No aortic stenosis is present.  6. Aortic dilatation noted. There is borderline dilatation of the aortic root, measuring 39 mm.  7. The inferior vena cava is dilated in size with >50% respiratory variability, suggesting right atrial pressure of 8 mmHg. FINDINGS  Left Ventricle: Left ventricular ejection fraction, by estimation, is 60 to 65%. The left ventricle has normal function. The left ventricle has no regional wall motion abnormalities. Definity contrast agent was given IV to delineate the left ventricular  endocardial borders. The left ventricular internal cavity size was normal in size. There is no left ventricular hypertrophy. Left ventricular diastolic function could not be evaluated due to atrial fibrillation. Left ventricular diastolic function could  not be evaluated. Elevated left atrial pressure. Right Ventricle: The right ventricular size is normal. No increase in right ventricular wall thickness. Right ventricular systolic function is normal. There is mildly elevated pulmonary artery systolic pressure. The tricuspid regurgitant velocity is 2.89  m/s, and with an assumed right atrial pressure of 8 mmHg, the estimated right ventricular systolic pressure is 83.4 mmHg. Left Atrium: Left atrial size was mildly dilated. Right Atrium: Right atrial size was normal in size. Pericardium: There is no evidence of pericardial effusion. Mitral Valve: The  mitral valve is abnormal. There is moderate calcification of the mitral valve leaflet(s). Moderate mitral annular calcification. Mild mitral valve regurgitation. No evidence of mitral valve stenosis. MV peak gr

## 2022-01-22 NOTE — Evaluation (Signed)
Occupational Therapy Evaluation ?Patient Details ?Name: [name] ?MRN: [id number] ?DOB: 02/08/1930 ?Today's Date: 01/22/2022 ? ? ?History of Present Illness 88 y.o. male presenting to ED 4/2 with weakness after fall 3 days prior. Found to have NSTEMI, acute renal failure on Stage 3a CKD  PMH: PAF, T2DM, HTN, HLD.  ? ?Clinical Impression ?  ?Pt admitted with the above diagnosis and has the deficits outlined below. Pt would benefit from cont OT to increase independence and safety with all adls and adl transfers so pt can d/c home with his wife. Pt currently takes care of his wife at home with the help of a CNA at times. Pt is not safe to d/c home with his wife and be alone at this time as he cannot come sit to stand out get out of the bed independently. Pt has children that live out of town; one is home taking care of his wife now. Feel pt needs a few weeks of 24/7 assist to get him stronger so he can care for himself and his wife.  Also discussed independent/assisted living with pt for the long run.  If family can assist for a couple of weeks or pt can get more nursing care, pt can d/c home safely but feel this is a must to ensure safety at d/c/  Rec HH if pt d/c's home or SNF care if this assist is not available. ?  ?   ? ?Recommendations for follow up therapy are one component of a multi-disciplinary discharge planning process, led by the attending physician.  Recommendations may be updated based on patient status, additional functional criteria and insurance authorization.  ? ?Follow Up Recommendations ? Home health OT  ?  ?Assistance Recommended at Discharge Frequent or constant Supervision/Assistance  ?Patient can return home with the following A little help with walking and/or transfers;A little help with bathing/dressing/bathroom;Assistance with cooking/housework;Assist for transportation ? ?  ?Functional Status Assessment ? Patient has had a recent decline in their functional status and demonstrates the ability  to make significant improvements in function in a reasonable and predictable amount of time.  ?Equipment Recommendations ? None recommended by OT  ?  ?Recommendations for Other Services   ? ? ?  ?Precautions / Restrictions Precautions ?Precautions: Fall ?Precaution Comments: fall prior to hospitalization ?Restrictions ?Weight Bearing Restrictions: No  ? ?  ? ?Mobility Bed Mobility ?Overal bed mobility: Needs Assistance ?Bed Mobility: Supine to Sit ?  ?  ?Supine to sit: Min assist ?  ?  ?General bed mobility comments: Pt's bed put flat since he does not have hospital bed at home. Pt required min assist to get truck and self into full sitting pushing off of L elbow. ?  ? ?Transfers ?Overall transfer level: Needs assistance ?Equipment used: Rolling walker (2 wheels) ?Transfers: Sit to/from Stand ?Sit to Stand: Min assist ?  ?  ?  ?  ?  ?General transfer comment: pt able to intiate power up but has poor knee extension R>L requiring increased time and effort to come to upright ?  ? ?  ?Balance Overall balance assessment: Needs assistance ?Sitting-balance support: No upper extremity supported, Feet supported ?Sitting balance-Leahy Scale: Fair ?  ?  ?Standing balance support: Single extremity supported, Bilateral upper extremity supported, During functional activity, Reliant on assistive device for balance ?Standing balance-Leahy Scale: Poor ?Standing balance comment: requires at least single UE support ?  ?  ?  ?  ?  ?  ?  ?  ?  ?  ?  ?   ? ?  ADL either performed or assessed with clinical judgement  ? ?ADL Overall ADL's : Needs assistance/impaired ?Eating/Feeding: Set up;Sitting ?  ?Grooming: Wash/dry hands;Wash/dry face;Oral care;Min guard;Standing ?Grooming Details (indicate cue type and reason): Pt fatigues quickly in standing. ?Upper Body Bathing: Set up;Sitting ?  ?Lower Body Bathing: Minimal assistance;Sit to/from stand;Cueing for compensatory techniques ?  ?Upper Body Dressing : Set up;Sitting ?  ?Lower Body  Dressing: Minimal assistance;Sit to/from stand;Cueing for compensatory techniques ?  ?Toilet Transfer: Minimal assistance;Ambulation;Comfort height toilet;Rolling walker (2 wheels);Grab bars ?  ?Toileting- Clothing Manipulation and Hygiene: Minimal assistance;Sit to/from stand;Cueing for compensatory techniques ?  ?  ?  ?Functional mobility during ADLs: Minimal assistance;Rolling walker (2 wheels) ?General ADL Comments: Pt limited in ability to come sit to stand from bed and toilet when standing during adls. Pt fatigues quickly.  ? ? ? ?Vision Baseline Vision/History: 1 Wears glasses ?Ability to See in Adequate Light: 0 Adequate ?Patient Visual Report: No change from baseline ?Vision Assessment?: No apparent visual deficits  ?   ?Perception   ?  ?Praxis   ?  ? ?Pertinent Vitals/Pain Pain Assessment ?Pain Assessment: Faces ?Faces Pain Scale: Hurts little more ?Pain Location: R forearm skin tear, L shoulder, R knee, generalized ?Pain Descriptors / Indicators: Grimacing ?Pain Intervention(s): Monitored during session, Repositioned  ? ? ? ?Hand Dominance Right ?  ?Extremity/Trunk Assessment Upper Extremity Assessment ?Upper Extremity Assessment: Generalized weakness ?RUE Deficits / Details: R shoulder pain with movement, decreased shoulder flexion, generalized weakness ?LUE Deficits / Details: AROM WFL, strength grossly 3+/5 ?  ?Lower Extremity Assessment ?Lower Extremity Assessment: Defer to PT evaluation ?  ?Cervical / Trunk Assessment ?Cervical / Trunk Assessment: Kyphotic ?  ?Communication Communication ?Communication: HOH ?  ?Cognition Arousal/Alertness: Awake/alert ?Behavior During Therapy: [employer] for tasks assessed/performed ?Overall Cognitive Status: Impaired/Different from baseline ?Area of Impairment: Safety/judgement ?  ?  ?  ?  ?  ?  ?  ?  ?  ?  ?  ?  ?Safety/Judgement: Decreased awareness of safety, Decreased awareness of deficits ?  ?  ?General Comments: decreased awareness of his level of safety, especially  in terms of his need for someone to be able to be with him when he is getting around in his home ?  ?  ?General Comments  Pt limited with LE adls and adl transfers. Pt very weak in his knees and has a diffcult time gettting into full standing from a normal level surface. Pt fatigues quickly and has had 2 falls in bathroom in the last 2 years. Pt instructed to use wifes bathroom bc there is a walk in shower and his walker fits in the bathroom.  Pt's walker does not fit in his own bathorom. ? ?  ?Exercises   ?  ?Shoulder Instructions    ? ? ?Home Living Family/patient expects to be discharged to:: Private residence ?Living Arrangements: Spouse/significant other ?Available Help at Discharge: Family;Available PRN/intermittently ?Type of Home: House ?Home Access: Ramped entrance ?  ?  ?Home Layout: One level ?  ?  ?Bathroom Shower/Tub: Gaffer;Tub/shower unit ?  ?Bathroom Toilet: Standard ?Bathroom Accessibility: Yes ?  ?Home Equipment: Conservation officer, nature (2 wheels);Shower seat - built in;Grab bars - tub/shower;Hand held shower head ?  ?Additional Comments: pt has tub bath and RW does not fit in his bathroom advised to use wife's where there is a walk in shower and he can use his RW ?  ? ?  ?Prior Functioning/Environment Prior Level of Function : Independent/Modified Independent ?  ?  ?  ?  ?  ?  ?  Mobility Comments: ambulates with RW ?ADLs Comments: independent in ADL, takes care of wife,and drives/cooks ?  ? ?  ?  ?OT Problem List: Decreased strength;Decreased range of motion;Decreased activity tolerance;Impaired balance (sitting and/or standing);Decreased safety awareness;Decreased knowledge of use of DME or AE;Pain ?  ?   ?OT Treatment/Interventions: Self-care/ADL training;Therapeutic activities;Balance training  ?  ?OT Goals(Current goals can be found in the care plan section) Acute Rehab OT Goals ?Patient Stated Goal: to go home ?OT Goal Formulation: With patient ?Time For Goal Achievement: 02/05/22 ?Potential to  Achieve Goals: Good ?ADL Goals ?Pt Will Perform Lower Body Bathing: sit to/from stand;with modified independence ?Pt Will Perform Lower Body Dressing: with supervision;sit to/from stand ?Pt Will Perform Tub/

## 2022-01-22 NOTE — Discharge Summary (Addendum)
? ?Physician Discharge Summary  ?[name] CHY:[id number] DOB: 10-Jan-1930 DOA: 01/20/2022 ? ?PCP: [name], MD ? ?Admit date: 01/20/2022 ?Discharge date: 01/22/2022 ? ?Admitted From: Home ?Discharge disposition: Home with home health PT and RN ? ?Recommendations at discharge:  ?Your blood pressure medications have been adjusted: Continue Cardizem at previous dose.  Continue losartan at lower dose.  Continue Imdur at increased dose. ?Continue Levemir 20 units daily.  Please adjust the dose only under the supervision of your outpatient provider.  Please maintain a log of his blood sugar level and follow-up with PCP for further adjustment. ? ?Brief narrative: ?[name] is a 88 y.o. male with PMH significant for T2DM, HTN, HLD, paroxysmal A-fib ?Patient presented to the ED on 4/2 with complaint of fall and weakness. ?A week ago, patient took a shower and felt like his legs were weak.  While walking out of the bathroom, his legs gave away and he fell down on his knees and then hit his bottom and head.  He had a skin tears in his right arm, tailbone.  He progressively got weaker and has been hard to get out of bed. ? ?Hemodynamically stable ?Blood glucose level initially was elevated over 500, creatinine elevated 1.82 higher than baseline, troponin elevated to 500 ?Chest x-[place] unremarkable ?CTA did not show any acute finding. ?Admitted to hospitalist service ?Cardiology was consulted. ? ?Subjective: ?Patient was seen and examined this morning.  Sitting up in chair.  Not in distress.  I spoke to patient's daughter available on the phone. ?Earlier this morning, patient had mild chest pain which spontaneously improved.  Cardiologist increase the dose of Imdur. ? ?Principal Problem: ?  NSTEMI (non-ST elevated myocardial infarction) ([place]) ?Active Problems: ?  Acute renal failure superimposed on stage 3a chronic kidney disease ([place]) ?  AF (paroxysmal atrial fibrillation) ([place]) ?  Type 2 diabetes mellitus without complication,  with long-term current use of insulin ([place]) ?  Essential hypertension ?  Hyperlipidemia ?  ? ?Assessment and Plan: ?NSTEMI (non-ST elevated myocardial infarction)  ?History of CAD ?-Primary presented with fall.  Found to have elevated troponin without any chest discomfort.   ?-Echocardiogram did not show any evidence of wall motion abnormality. ?-Cardiology consult obtained.  No need of further ischemia work-up.   ?-Continue aspirin, statin and increased dose of Imdur. ?Recent Labs  ?  01/20/22 ?1201 01/20/22 ?1356  ?CKTOTAL  --  101  ?TROPONINIHS 505* 471*  ? ?Permanent A-fib ?-Rate controlled on Cardizem. ?-Defer to primary cardiologist as an outpatient for anticoagulation.  Presumably he is not on anticoagulation because of falls. ?-Continue to monitor in telemetry ? ?AKI on CKD 3a ?-Likely prerenal AKI because of poor hydration status. ?-Improved with IV fluid.  Back to baseline now. ?Recent Labs  ?  08/01/21 ?1240 10/27/21 ?1220 01/20/22 ?1136 01/21/22 ?0152 01/22/22 ?0255  ?BUN 28* 29* 41* 31* 27*  ?CREATININE 1.23 1.42* 1.82* 1.30* 1.29*  ? ?Hypokalemia ?-Potassium level improved with replacement. ?Recent Labs  ?Lab 01/20/22 ?1136 01/21/22 ?0152 01/22/22 ?0255  ?K 4.3 3.3* 3.7  ?MG 1.9  --   --   ? ?Type 2 diabetes mellitus ?-A1c was 7 from 2020 ?-PTA, patient was using Levemir but was inconsistent with his dosing.  ?-His repeat A1c is significantly elevated at 12.8 because of inconsistent insulin use.   ?-Patient's blood sugar level is currently controlled on Semglee 20 units..  Post discharge, he will continue Levemir at 20 units consistent dose.  He will maintain a log of  his blood sugar level and follow-up with PCP for further adjustment. ?Recent Labs  ?Lab 01/21/22 ?1201 01/21/22 ?1606 01/21/22 ?2059 01/22/22 ?0737 01/22/22 ?1128  ?GLUCAP 237* 269* 248* 119* 193*  ? ?Hyperlipidemia ?-Continue lipitor '80mg'$   ? ?Essential hypertension ?-PTA, patient was on Cardizem, Imdur, losartan.   ?-Continue Cardizem at  previous dose.  Continue losartan at lower dose.  Continue Imdur at increased dose. ?-Continue to monitor blood pressure at home. ? ? ?Wounds:  ?- ?Incision - 4 Ports Abdomen 1: Umbilicus 2: Medial;Upper 3: Right;Upper 4: Right;Lower (Active)  ?Placement Date/Time: 06/11/19 1220   Location of Ports: Abdomen  Port: 1:  Location Orientation: Umbilicus  Port: 2:  Location Orientation: Medial;Upper  Port: 3:  Location Orientation: Right;Upper  Port: 4:  Location Orientation: Right;Lower  ?  ?Assessments 06/11/2019  1:27 PM 06/15/2019  8:15 AM  ?Port 1 Site Assessment Clean;Dry Clean;Dry  ?Port 1 Dressing Type Liquid skin adhesive Liquid skin adhesive  ?Port 1 Dressing Status Clean;Dry;Intact Clean;Dry;Intact  ?Port 2 Site Assessment Clean;Dry Clean;Dry  ?Port 2 Dressing Type Liquid skin adhesive Liquid skin adhesive  ?Port 2 Dressing Status Clean;Dry;Intact Clean;Dry;Intact  ?Port 3 Site Assessment Clean;Dry Clean;Dry  ?Port 3 Dressing Type Liquid skin adhesive Liquid skin adhesive  ?Port 3 Dressing Status Clean;Dry;Intact Clean;Dry;Intact  ?Port 4 Site Assessment Clean;Dry Clean;Dry  ?Port 4 Dressing Type Liquid skin adhesive Liquid skin adhesive  ?Port 4 Dressing Status Clean;Dry;Intact Clean;Dry;Intact  ?   ?No Linked orders to display  ?   ?Incision (Closed) 06/11/19 Abdomen Other (Comment) (Active)  ?Date First Assessed/Time First Assessed: 06/11/19 1241   Location: Abdomen  Location Orientation: Other (Comment)  ?  ?Assessments 06/11/2019  1:27 PM 06/15/2019  8:15 AM  ?Dressing Type Liquid skin adhesive Liquid skin adhesive  ?Dressing Clean;Dry;Intact Clean;Dry;Intact  ?Site / Wound Assessment Clean;Dry Clean;Dry  ?Drainage Amount -- None  ?   ?No Linked orders to display  ?   ?Wound / Incision (Open or Dehisced) 01/20/22 Arm Distal;Left;Posterior;Upper Skin Tear (Active)  ?Date First Assessed/Time First Assessed: 01/20/22 1900   Location: Arm  Location Orientation: Distal;Left;Posterior;Upper  Wound Description  (Comments): Skin Tear  Present on Admission: Yes  ?  ?Assessments 01/20/2022  5:00 PM 01/21/2022  8:00 PM  ?Dressing Type Gauze (Comment) Other (Comment)  ?Dressing Changed New --  ?Dressing Status Clean, Dry, Intact Clean, Dry, Intact  ?Site / Wound Assessment Bleeding Dressing in place / Unable to assess  ?Drainage Amount Moderate --  ?Drainage Description Serosanguineous --  ?   ?No Linked orders to display  ?   ?Wound / Incision (Open or Dehisced) 01/20/22 Skin tear Arm Anterior;Lower;Right skin tear (Active)  ?Date First Assessed: 01/20/22   Wound Type: Skin tear  Location: Arm  Location Orientation: Anterior;Lower;Right  Wound Description (Comments): skin tear  Present on Admission: Yes  ?  ?Assessments 01/20/2022  5:00 PM 01/21/2022  8:00 PM  ?Dressing Type Gauze (Comment) Other (Comment)  ?Dressing Changed New --  ?Dressing Status Clean, Dry, Intact Clean, Dry, Intact  ?Site / Wound Assessment -- Dressing in place / Unable to assess  ?Wound Length (cm) 5 cm --  ?Wound Width (cm) 2 cm --  ?Wound Surface Area (cm^2) 10 cm^2 --  ?Margins Attached edges (approximated) --  ?Drainage Amount Minimal --  ?Drainage Description Serosanguineous --  ?   ?No Linked orders to display  ?   ?Wound / Incision (Open or Dehisced) 01/20/22 Skin tear Arm Anterior;Lower;Proximal;Right skin tear (Active)  ?Date First Assessed/Time  First Assessed: 01/20/22 2028   Wound Type: Skin tear  Location: Arm  Location Orientation: Anterior;Lower;Proximal;Right  Wound Description (Comments): skin tear  Present on Admission: Yes  ?  ?Assessments 01/20/2022  7:00 PM 01/21/2022  8:00 PM  ?Dressing Type Gauze (Comment) Other (Comment)  ?Dressing Changed New --  ?Dressing Status Clean, Dry, Intact Clean, Dry, Intact  ?Site / Wound Assessment -- Dressing in place / Unable to assess  ?Wound Length (cm) 6 cm --  ?Wound Width (cm) 2 cm --  ?Wound Surface Area (cm^2) 12 cm^2 --  ?Margins Unattached edges (unapproximated) --  ?Drainage Amount Minimal --  ?Drainage  Description Serosanguineous --  ?   ?No Linked orders to display  ? ? ?Discharge Exam:  ? ?Vitals:  ? 01/22/22 0415 01/22/22 0800 01/22/22 0805 01/22/22 1130  ?BP: (!) 119/52  (!) 145/64 113/84  ?Pulse: (

## 2022-01-22 NOTE — Progress Notes (Signed)
PT Cancellation Note ? ?Patient Details ?Name: [name] ?MRN: [id number] ?DOB: Jun 01, 1930 ? ? ?Cancelled Treatment:    Reason Eval/Treat Not Completed: Patient declined, no reason specified. Patient reports he just got back into bed. Had sat up several hours and is planning for discharge this pm.   ? ? ?Joyce Leckey ?01/22/2022, 1:48 PM ?

## 2022-01-24 DIAGNOSIS — I214 Non-ST elevation (NSTEMI) myocardial infarction: Secondary | ICD-10-CM | POA: Diagnosis not present

## 2022-01-24 DIAGNOSIS — I4892 Unspecified atrial flutter: Secondary | ICD-10-CM | POA: Diagnosis not present

## 2022-01-24 DIAGNOSIS — R531 Weakness: Secondary | ICD-10-CM | POA: Diagnosis not present

## 2022-01-24 DIAGNOSIS — E1122 Type 2 diabetes mellitus with diabetic chronic kidney disease: Secondary | ICD-10-CM | POA: Diagnosis not present

## 2022-01-24 DIAGNOSIS — N1831 Chronic kidney disease, stage 3a: Secondary | ICD-10-CM | POA: Diagnosis not present

## 2022-01-24 DIAGNOSIS — I10 Essential (primary) hypertension: Secondary | ICD-10-CM | POA: Diagnosis not present

## 2022-01-24 DIAGNOSIS — I2510 Atherosclerotic heart disease of native coronary artery without angina pectoris: Secondary | ICD-10-CM | POA: Diagnosis not present

## 2022-01-24 DIAGNOSIS — E1165 Type 2 diabetes mellitus with hyperglycemia: Secondary | ICD-10-CM | POA: Diagnosis not present

## 2022-01-24 DIAGNOSIS — N179 Acute kidney failure, unspecified: Secondary | ICD-10-CM | POA: Diagnosis not present

## 2022-01-24 DIAGNOSIS — E1142 Type 2 diabetes mellitus with diabetic polyneuropathy: Secondary | ICD-10-CM | POA: Diagnosis not present

## 2022-01-24 DIAGNOSIS — I129 Hypertensive chronic kidney disease with stage 1 through stage 4 chronic kidney disease, or unspecified chronic kidney disease: Secondary | ICD-10-CM | POA: Diagnosis not present

## 2022-01-24 DIAGNOSIS — I351 Nonrheumatic aortic (valve) insufficiency: Secondary | ICD-10-CM | POA: Diagnosis not present

## 2022-03-25 ENCOUNTER — Ambulatory Visit: Payer: Medicare PPO | Admitting: Podiatry

## 2022-03-25 DIAGNOSIS — M79674 Pain in right toe(s): Secondary | ICD-10-CM | POA: Diagnosis not present

## 2022-03-25 DIAGNOSIS — B351 Tinea unguium: Secondary | ICD-10-CM

## 2022-03-25 DIAGNOSIS — M79675 Pain in left toe(s): Secondary | ICD-10-CM | POA: Diagnosis not present

## 2022-03-25 DIAGNOSIS — E1142 Type 2 diabetes mellitus with diabetic polyneuropathy: Secondary | ICD-10-CM | POA: Diagnosis not present

## 2022-04-01 NOTE — Progress Notes (Signed)
Subjective: 88 y.o. returns the office today for painful, elongated, thickened toenails which he cannot trim himself. Denies any redness or drainage around the nails.  States he does have neuropathy and he is on gabapentin which is not helping much.  Denies any acute changes since last appointment and no new complaints today. Denies any systemic complaints such as fevers, chills, nausea, vomiting.   PCP: [name], MD A1c: 12.8 on January 21, 2022  Objective: AAO 3, NAD DP/PT pulses palpable, CRT less than 3 seconds Protective sensation decreased with Simms Weinstein monofilament, Achilles tendon reflex intact.  Nails hypertrophic, dystrophic, elongated, brittle, discolored 10. There is tenderness overlying the nails 1-5 bilaterally. There is no surrounding erythema or drainage along the nail sites. No open lesions or pre-ulcerative lesions are identified. No other areas of tenderness bilateral lower extremities. No overlying edema, erythema, increased warmth. No pain with calf compression, swelling, warmth, erythema.  Assessment: Patient presents with symptomatic onychomycosis, uncontrolled diabetes with neuropathy  Plan: -Treatment options including alternatives, risks, complications were discussed -Nails sharply debrided 10 without complication/bleeding. -Continue gabapentin for neuropathy.  We did discuss changing medications today.  Glucose control. -Discussed daily foot inspection. If there are any changes, to call the office immediately.  -Follow-up in 3 months or sooner if any problems are to arise. In the meantime, encouraged to call the office with any questions, concerns, changes symptoms.  Celesta Gentile, DPM

## 2022-04-12 DIAGNOSIS — H40013 Open angle with borderline findings, low risk, bilateral: Secondary | ICD-10-CM | POA: Diagnosis not present

## 2022-04-12 DIAGNOSIS — Z961 Presence of intraocular lens: Secondary | ICD-10-CM | POA: Diagnosis not present

## 2022-04-12 DIAGNOSIS — H524 Presbyopia: Secondary | ICD-10-CM | POA: Diagnosis not present

## 2022-04-26 DIAGNOSIS — D6869 Other thrombophilia: Secondary | ICD-10-CM | POA: Diagnosis not present

## 2022-04-26 DIAGNOSIS — I4891 Unspecified atrial fibrillation: Secondary | ICD-10-CM | POA: Diagnosis not present

## 2022-04-26 DIAGNOSIS — E1142 Type 2 diabetes mellitus with diabetic polyneuropathy: Secondary | ICD-10-CM | POA: Diagnosis not present

## 2022-04-26 DIAGNOSIS — E782 Mixed hyperlipidemia: Secondary | ICD-10-CM | POA: Diagnosis not present

## 2022-04-26 DIAGNOSIS — R600 Localized edema: Secondary | ICD-10-CM | POA: Diagnosis not present

## 2022-04-26 DIAGNOSIS — I10 Essential (primary) hypertension: Secondary | ICD-10-CM | POA: Diagnosis not present

## 2022-04-26 DIAGNOSIS — G629 Polyneuropathy, unspecified: Secondary | ICD-10-CM | POA: Diagnosis not present

## 2022-04-26 DIAGNOSIS — K219 Gastro-esophageal reflux disease without esophagitis: Secondary | ICD-10-CM | POA: Diagnosis not present

## 2022-07-08 ENCOUNTER — Ambulatory Visit: Payer: Medicare PPO | Admitting: Podiatry

## 2022-07-08 ENCOUNTER — Encounter: Payer: Self-pay | Admitting: Podiatry

## 2022-07-08 DIAGNOSIS — G4700 Insomnia, unspecified: Secondary | ICD-10-CM | POA: Insufficient documentation

## 2022-07-08 DIAGNOSIS — L600 Ingrowing nail: Secondary | ICD-10-CM

## 2022-07-08 DIAGNOSIS — C434 Malignant melanoma of scalp and neck: Secondary | ICD-10-CM | POA: Insufficient documentation

## 2022-07-08 DIAGNOSIS — M79674 Pain in right toe(s): Secondary | ICD-10-CM | POA: Diagnosis not present

## 2022-07-08 DIAGNOSIS — IMO0002 Reserved for concepts with insufficient information to code with codable children: Secondary | ICD-10-CM | POA: Insufficient documentation

## 2022-07-08 DIAGNOSIS — M25562 Pain in left knee: Secondary | ICD-10-CM | POA: Insufficient documentation

## 2022-07-08 DIAGNOSIS — M2041 Other hammer toe(s) (acquired), right foot: Secondary | ICD-10-CM | POA: Diagnosis not present

## 2022-07-08 DIAGNOSIS — M810 Age-related osteoporosis without current pathological fracture: Secondary | ICD-10-CM | POA: Insufficient documentation

## 2022-07-08 DIAGNOSIS — I872 Venous insufficiency (chronic) (peripheral): Secondary | ICD-10-CM | POA: Insufficient documentation

## 2022-07-08 DIAGNOSIS — B351 Tinea unguium: Secondary | ICD-10-CM | POA: Diagnosis not present

## 2022-07-08 DIAGNOSIS — M79675 Pain in left toe(s): Secondary | ICD-10-CM | POA: Diagnosis not present

## 2022-07-08 DIAGNOSIS — E1142 Type 2 diabetes mellitus with diabetic polyneuropathy: Secondary | ICD-10-CM | POA: Diagnosis not present

## 2022-07-08 DIAGNOSIS — I499 Cardiac arrhythmia, unspecified: Secondary | ICD-10-CM | POA: Insufficient documentation

## 2022-07-08 DIAGNOSIS — M2042 Other hammer toe(s) (acquired), left foot: Secondary | ICD-10-CM | POA: Diagnosis not present

## 2022-07-08 DIAGNOSIS — E119 Type 2 diabetes mellitus without complications: Secondary | ICD-10-CM | POA: Diagnosis not present

## 2022-07-08 DIAGNOSIS — L820 Inflamed seborrheic keratosis: Secondary | ICD-10-CM | POA: Insufficient documentation

## 2022-07-08 DIAGNOSIS — I4892 Unspecified atrial flutter: Secondary | ICD-10-CM | POA: Insufficient documentation

## 2022-07-08 DIAGNOSIS — C4441 Basal cell carcinoma of skin of scalp and neck: Secondary | ICD-10-CM | POA: Insufficient documentation

## 2022-07-08 DIAGNOSIS — N138 Other obstructive and reflux uropathy: Secondary | ICD-10-CM | POA: Insufficient documentation

## 2022-07-08 DIAGNOSIS — L570 Actinic keratosis: Secondary | ICD-10-CM | POA: Insufficient documentation

## 2022-07-08 HISTORY — DX: Type 2 diabetes mellitus with diabetic polyneuropathy: E11.42

## 2022-07-08 NOTE — Progress Notes (Signed)
ANNUAL DIABETIC FOOT EXAM  Subjective: [name] presents today for annual diabetic foot examination. He is accompanied by his son [name], who is visiting from out of state.  Patient confirms h/o diabetes.  Patient relates 30 year h/o diabetes.  Patient denies any h/o foot wounds.  Patient has been diagnosed with neuropathy and it is managed with gabapentin.  Patient's blood sugar was 157 mg/dl today. Last known  HgA1c was 8.0%   Risk factors: diabetes, diabetic neuropathy, h/o CVA, h/o MI, hyperlipidemia, h/o tobacco use in remission.  [name], MD is patient's PCP. Last visit was December, 2022.  Past Medical History:  Diagnosis Date   Anemia    related to frequent nosebleeds-right nostril.   Aortic insufficiency    a. mild by echo 2013.   Arthritis    arthritis -back knees, most joints   Atrial flutter ([place])    a. per report in 2013.   Breathing difficulty 11-10-12   difficult to breath if laying posteriorly, right nare difficulty   Carotid artery disease ([place])    a. Remote R CEA in the 1990s per pt.   Change in hearing    Diabetes mellitus age 28   Diabetic peripheral neuropathy associated with type 2 diabetes mellitus ([place]) 07/08/2022   Dysrhythmia 07/2012   hx. Atrial Flutter x1, converted spontaneously-no ploblems.    H/O hiatal hernia    HOH (hard of hearing) 11-10-12   bilaterally   Hyperlipidemia    Hypertension    Ulcer 1995   bleeding ulcer, none since   Patient Active Problem List   Diagnosis Date Noted   Actinic keratosis 07/08/2022   Arrhythmia 07/08/2022   Atrial flutter ([place]) 07/08/2022   Basal cell carcinoma of scalp and skin of neck 07/08/2022   Diabetic peripheral neuropathy associated with type 2 diabetes mellitus ([place]) 07/08/2022   Hypertrophy of prostate with urinary obstruction and other lower urinary tract symptoms (LUTS) 07/08/2022   Inflamed seborrheic keratosis 07/08/2022   Insomnia 07/08/2022   Malignant melanoma of head and neck  ([place]) 07/08/2022   Osteoporosis 07/08/2022   Pain in left knee 07/08/2022   Type 2 (non-insulin dependent type) or unspecified type diabetes mellitus with neurological manifestations, uncontrolled 07/08/2022   Venous (peripheral) insufficiency 07/08/2022   NSTEMI (non-ST elevated myocardial infarction) ([place]) 01/20/2022   Acute renal failure superimposed on stage 3a chronic kidney disease ([place]) 01/20/2022   Type 2 diabetes mellitus without complication, with long-term current use of insulin ([place]) 01/20/2022   Chest pain of uncertain etiology 91/63/8466   Gastritis 06/08/2019   Acute kidney injury ([place]) 06/08/2019   Pancreatic lesion 06/07/2019   Abdominal pain 06/06/2019   AF (paroxysmal atrial fibrillation) ([place]) 06/06/2019   Dehydration, mild 06/06/2019   Melanoma in situ of neck ([place]) 01/18/2016   Open angle with borderline findings and low glaucoma risk in both eyes 07/21/2014   Seasonal allergies 02/14/2014   H/O carotid stenosis 01/21/2014   Arthritis of left knee 01/05/2014   Essential hypertension 11/05/2013   Hyperlipidemia 11/05/2013   Diabetes ([place]) 11/05/2013   Edema 09/07/2013   Peripheral neuropathy 09/07/2013   Aftercare following surgery of the circulatory system, NEC 06/30/2013   Primary osteoarthritis of right knee 11/18/2012   Occlusion and stenosis of carotid artery without mention of cerebral infarction 06/30/2012   Past Surgical History:  Procedure Laterality Date   CAROTID DUPLEX  06/30/2012   PATENT RIGHT CAROTID ENDARTERECTOMY. 1%-39% LEFT ICA. STABLE   CAROTID ENDARTERECTOMY  11/242008   right  with Dacron patch angioplasty   CATARACT EXTRACTION  1998 bilateral  done 3 months apart   CHOLECYSTECTOMY N/A 06/11/2019   Procedure: LAPAROSCOPIC CHOLECYSTECTOMY;  Surgeon: Erroll Luna, MD;  Location: [facility];  Service: General;  Laterality: N/A;   ESOPHAGOGASTRODUODENOSCOPY (EGD) WITH PROPOFOL N/A 06/08/2019   Procedure: ESOPHAGOGASTRODUODENOSCOPY (EGD) WITH  PROPOFOL;  Surgeon: Wilford Corner, MD;  Location: [facility];  Service: Endoscopy;  Laterality: N/A;   EYE SURGERY     JOINT REPLACEMENT Right 11-18-12   KNEE BURSECTOMY  11/18/2012   Procedure: KNEE BURSECTOMY;  Surgeon: Tobi Bastos, MD;  Location: [facility];  Service: Orthopedics;  Laterality: Right;   NM MYOCAR MULTIPLE W/SPECT  10/08/2012   NORMAL STRESS NUCLEAR STUDY.   TOTAL KNEE ARTHROPLASTY  11/18/2012   Procedure: TOTAL KNEE ARTHROPLASTY;  Surgeon: Tobi Bastos, MD;  Location: [facility];  Service: Orthopedics;  Laterality: Right;   TRANSTHORACIC ECHOCARDIOGRAM  10/08/2012   MODERATE LVH. MODERATE CONCENTRIC HYPERTROPHY.EF 65 TO 70%. GRADE 1 DYSTOLIC DYSFUNCTION. ELEVATED VENTRICULAR END-DIASTOLIC FILLING PRESSURE AND LEFT ATRIAL FILLING PRESSURE. AV- MILD TO MODERATE CALCIFIED ANNULUS. MV- CALCIFIC DEGENERATION.Marland Kitchen LA-MILDLY DILATED.   Current Outpatient Medications on File Prior to Visit  Medication Sig Dispense Refill   gabapentin (NEURONTIN) 300 MG capsule Take 2 capsules by mouth 2 (two) times daily.     acetaminophen (TYLENOL) 500 MG tablet Take 500 mg by mouth every 6 (six) hours as needed for moderate pain (for pain).     aspirin EC 81 MG tablet Take 1 tablet (81 mg total) by mouth daily. 90 tablet 3   atorvastatin (LIPITOR) 80 MG tablet Take 80 mg by mouth every evening.      cycloSPORINE (RESTASIS) 0.05 % ophthalmic emulsion Place 1 drop into both eyes 2 (two) times daily.     diltiazem (CARDIZEM LA) 180 MG 24 hr tablet Take 180 mg by mouth daily.     furosemide (LASIX) 20 MG tablet Take 20 mg by mouth daily.     gabapentin (NEURONTIN) 300 MG capsule Take 600 mg by mouth 2 (two) times daily.     gabapentin (NEURONTIN) 800 MG tablet Take 800 mg by mouth 2 (two) times daily.     gemfibrozil (LOPID) 600 MG tablet Take 600 mg by mouth every evening.     insulin detemir (LEVEMIR) 100 UNIT/ML injection Inject 0.2 mLs (20 Units total) into the skin at bedtime. 10 mL 11    isosorbide mononitrate (IMDUR) 30 MG 24 hr tablet Take 2 tablets (60 mg total) by mouth daily. 60 tablet 2   JARDIANCE 10 MG TABS tablet Take 10 mg by mouth daily.     lidocaine (LIDODERM) 5 % Place 1 patch onto the skin daily as needed (for pain- on 12 hours/off 12 hours).     losartan (COZAAR) 100 MG tablet Take 1 tablet every day by oral route.     losartan (COZAAR) 25 MG tablet Take 1 tablet (25 mg total) by mouth daily before breakfast. 30 tablet 2   nitroGLYCERIN (NITROSTAT) 0.3 MG SL tablet Place 0.3 mg under the tongue every 5 (five) minutes as needed for chest pain.     Omega-3 1000 MG CAPS Take 2,000 mg by mouth daily.     omeprazole (PRILOSEC) 20 MG capsule Take 20 mg by mouth daily.     Omeprazole Magnesium (PRILOSEC PO)      pioglitazone (ACTOS) 45 MG tablet Take 45 mg by mouth daily.     traZODone (DESYREL) 100 MG  tablet Take 100 mg by mouth at bedtime as needed for sleep.      No current facility-administered medications on file prior to visit.    No Known Allergies Social History   Occupational History   Occupation: retired  Tobacco Use   Smoking status: Former    Types: Cigarettes    Quit date: 10/21/1976    Years since quitting: 45.7   Smokeless tobacco: Never  Substance and Sexual Activity   Alcohol use: Yes    Alcohol/week: 1.0 standard drink of alcohol    Types: 1 Glasses of wine per week    Comment: 1-2 alcoholic drinks daily   Drug use: No   Sexual activity: Not on file   Family History  Problem Relation Age of Onset   Cancer Mother        Pancreatic   Heart disease Mother    Cancer Brother        pancreactic   Cancer Daughter        Breast    There is no immunization history on file for this patient.   Review of Systems: Negative except as noted in the HPI.   Objective: There were no vitals filed for this visit.  [name] is a pleasant 88 y.o. male in NAD. AAO X 3.  Vascular Examination: CFT <3 seconds b/l LE. Faintly palpable DP pulses b/l  LE. Faintly palpable PT pulse(s) b/l LE. Pedal hair sparse. No pain with calf compression b/l. Lower extremity skin temperature gradient within normal limits. Trace edema noted BLE.  Dermatological Examination: Pedal skin thin and atrophic b/l LE. No open wounds b/l LE. No interdigital macerations noted b/l LE. Toenails 1-5 bilaterally elongated, discolored, dystrophic, thickened, and crumbly with subungual debris and tenderness to dorsal palpation. Incurvated nailplate both borders of left hallux and both borders of right hallux.  Nail border hypertrophy absent. There is tenderness to palpation. Sign(s) of infection: no clinical signs of infection noted on examination today.. No hyperkeratotic nor porokeratotic lesions present on today's visit.  Neurological Examination: Pt has subjective symptoms of neuropathy. Protective sensation decreased with 10 gram monofilament b/l. Vibratory sensation intact b/l.  Musculoskeletal Examination: Muscle strength 5/5 to all lower extremity muscle groups bilaterally. No pain, crepitus or joint limitation noted with ROM bilateral LE. Hammertoe deformity noted 2-5 b/l.  Footwear Assessment: Does the patient wear appropriate shoes? Yes. Does the patient need inserts/orthotics? No.  ADA Risk Categorization: High Risk  Patient has one or more of the following: Loss of protective sensation Absent pedal pulses Severe Foot deformity History of foot ulcer  Assessment: 1. Pain due to onychomycosis of toenails of both feet   2. Ingrown toenail without infection   3. Acquired hammertoes of both feet   4. Type 2 diabetes mellitus with diabetic polyneuropathy, unspecified whether long term insulin use ([place])   5. Encounter for diabetic foot exam [facility])   Plan: -Patient was evaluated and treated. All patient's and/or POA's questions/concerns answered on today's visit. -Patient's family member present. All questions/concerns addressed on today's visit. -Diabetic  foot examination performed today. -Patient to continue soft, supportive shoe gear daily. -Toenails 2-5 bilaterally debrided in length and girth without iatrogenic bleeding with sterile nail nipper and dremel.  -Offending nail border debrided and curretaged bilateral great toes utilizing sterile nail nipper and currette. Border(s) cleansed with alcohol and TAO applied. Patient/POA/Caregiver/Facility instructed to apply triple antibiotic ointment  to bilateral great toes once daily for 7 days. Call office if there are any  concerns. -Patient/POA to call should there be question/concern in the interim. Return in about 3 months (around 10/07/2022).  Marzetta Board, DPM

## 2022-07-13 ENCOUNTER — Encounter: Payer: Self-pay | Admitting: Podiatry

## 2022-08-08 DIAGNOSIS — D6869 Other thrombophilia: Secondary | ICD-10-CM | POA: Diagnosis not present

## 2022-08-08 DIAGNOSIS — Z0000 Encounter for general adult medical examination without abnormal findings: Secondary | ICD-10-CM | POA: Diagnosis not present

## 2022-08-08 DIAGNOSIS — I4891 Unspecified atrial fibrillation: Secondary | ICD-10-CM | POA: Diagnosis not present

## 2022-08-08 DIAGNOSIS — E1142 Type 2 diabetes mellitus with diabetic polyneuropathy: Secondary | ICD-10-CM | POA: Diagnosis not present

## 2022-08-08 DIAGNOSIS — I10 Essential (primary) hypertension: Secondary | ICD-10-CM | POA: Diagnosis not present

## 2022-08-08 DIAGNOSIS — I700 Atherosclerosis of aorta: Secondary | ICD-10-CM | POA: Diagnosis not present

## 2022-08-08 DIAGNOSIS — G629 Polyneuropathy, unspecified: Secondary | ICD-10-CM | POA: Diagnosis not present

## 2022-08-08 DIAGNOSIS — I679 Cerebrovascular disease, unspecified: Secondary | ICD-10-CM | POA: Diagnosis not present

## 2022-08-08 DIAGNOSIS — E782 Mixed hyperlipidemia: Secondary | ICD-10-CM | POA: Diagnosis not present

## 2022-09-30 ENCOUNTER — Ambulatory Visit: Payer: Medicare PPO | Admitting: Podiatry

## 2022-09-30 ENCOUNTER — Encounter: Payer: Self-pay | Admitting: Podiatry

## 2022-09-30 VITALS — BP 146/65

## 2022-09-30 DIAGNOSIS — E1142 Type 2 diabetes mellitus with diabetic polyneuropathy: Secondary | ICD-10-CM

## 2022-09-30 DIAGNOSIS — M79674 Pain in right toe(s): Secondary | ICD-10-CM | POA: Diagnosis not present

## 2022-09-30 DIAGNOSIS — B351 Tinea unguium: Secondary | ICD-10-CM | POA: Diagnosis not present

## 2022-09-30 DIAGNOSIS — M79675 Pain in left toe(s): Secondary | ICD-10-CM

## 2022-09-30 NOTE — Progress Notes (Signed)
  Subjective:  Patient ID: [name], male    DOB: 1930-06-12,  MRN: [id number]  [name] presents to clinic today for at risk foot care with history of diabetic neuropathy and painful elongated mycotic toenails 1-5 bilaterally which are tender when wearing enclosed shoe gear. Pain is relieved with periodic professional debridement.  Chief Complaint  Patient presents with   Nail Problem    DFC [id number] A1C-8.0 PCP-[facility], PCP VST-2 months ago   New problem(s): None.   PCP is Ramiro Harvest, PA-C.  No Known Allergies  Review of Systems: Negative except as noted in the HPI.  Objective:  There were no vitals filed for this visit.  [name] is a pleasant 88 y.o. male WD, WN in NAD. AAO x 3.  Vascular Examination: CFT <3 seconds b/l LE. Faintly palpable DP pulses b/l LE. Faintly palpable PT pulse(s) b/l LE. Pedal hair sparse. No pain with calf compression b/l. Lower extremity skin temperature gradient within normal limits. Trace edema noted BLE.  Dermatological Examination: Pedal skin thin and atrophic b/l LE. No open wounds b/l LE. No interdigital macerations noted b/l LE.   Toenails 1-5 bilaterally elongated, discolored, dystrophic, thickened, and crumbly with subungual debris and tenderness to dorsal palpation.   Incurvated nailplate both borders of left hallux and both borders of right hallux.  Nail border hypertrophy absent. There is tenderness to palpation. Sign(s) of infection: no clinical signs of infection noted on examination today.. No hyperkeratotic nor porokeratotic lesions present on today's visit.  Neurological Examination: Pt has subjective symptoms of neuropathy. Protective sensation decreased with 10 gram monofilament b/l. Vibratory sensation intact b/l.  Musculoskeletal Examination: Muscle strength 5/5 to all lower extremity muscle groups bilaterally. No pain, crepitus or joint limitation noted with ROM bilateral LE. Hammertoe deformity noted 2-5 b/l.  Utilizes walker for ambulation assistance.  Assessment/Plan: 1. Pain due to onychomycosis of toenails of both feet   2. Type 2 diabetes mellitus with diabetic polyneuropathy, unspecified whether long term insulin use ([place])     No orders of the defined types were placed in this encounter.  -Consent given for treatment as described below: -Examined patient. -Continue foot and shoe inspections daily. Monitor blood glucose per PCP/Endocrinologist's recommendations. -Continue supportive shoe gear daily. -Mycotic toenails 1-5 bilaterally were debrided in length and girth with sterile nail nippers and dremel. Pinpoint bleeding of right great toe addressed with Lumicain Hemostatic Solution, cleansed with alcohol. triple antibiotic ointment applied. He may remove band-aid on tomorrow. No further treatment required by patient/caregiver. -Patient/POA to call should there be question/concern in the interim.   No follow-ups on file.  Marzetta Board, DPM

## 2022-10-01 ENCOUNTER — Other Ambulatory Visit: Payer: Self-pay | Admitting: Cardiovascular Disease

## 2022-10-25 ENCOUNTER — Ambulatory Visit: Payer: Medicare PPO | Admitting: Podiatry

## 2022-12-11 DIAGNOSIS — E782 Mixed hyperlipidemia: Secondary | ICD-10-CM | POA: Diagnosis not present

## 2022-12-11 DIAGNOSIS — I700 Atherosclerosis of aorta: Secondary | ICD-10-CM | POA: Diagnosis not present

## 2022-12-11 DIAGNOSIS — E1142 Type 2 diabetes mellitus with diabetic polyneuropathy: Secondary | ICD-10-CM | POA: Diagnosis not present

## 2022-12-11 DIAGNOSIS — E1165 Type 2 diabetes mellitus with hyperglycemia: Secondary | ICD-10-CM | POA: Diagnosis not present

## 2022-12-11 DIAGNOSIS — D6869 Other thrombophilia: Secondary | ICD-10-CM | POA: Diagnosis not present

## 2022-12-11 DIAGNOSIS — I2510 Atherosclerotic heart disease of native coronary artery without angina pectoris: Secondary | ICD-10-CM | POA: Diagnosis not present

## 2022-12-11 DIAGNOSIS — I10 Essential (primary) hypertension: Secondary | ICD-10-CM | POA: Diagnosis not present

## 2022-12-11 DIAGNOSIS — I4891 Unspecified atrial fibrillation: Secondary | ICD-10-CM | POA: Diagnosis not present

## 2022-12-11 DIAGNOSIS — E1151 Type 2 diabetes mellitus with diabetic peripheral angiopathy without gangrene: Secondary | ICD-10-CM | POA: Diagnosis not present

## 2022-12-11 DIAGNOSIS — N1831 Chronic kidney disease, stage 3a: Secondary | ICD-10-CM | POA: Diagnosis not present

## 2022-12-14 ENCOUNTER — Other Ambulatory Visit: Payer: Self-pay

## 2022-12-14 ENCOUNTER — Emergency Department (HOSPITAL_BASED_OUTPATIENT_CLINIC_OR_DEPARTMENT_OTHER)
Admission: EM | Admit: 2022-12-14 | Discharge: 2022-12-15 | Disposition: A | Discharge status: Home / Self Care | Payer: Medicare PPO | Attending: Emergency Medicine | Admitting: Emergency Medicine

## 2022-12-14 ENCOUNTER — Emergency Department (HOSPITAL_BASED_OUTPATIENT_CLINIC_OR_DEPARTMENT_OTHER): Payer: Medicare PPO

## 2022-12-14 ENCOUNTER — Encounter (HOSPITAL_BASED_OUTPATIENT_CLINIC_OR_DEPARTMENT_OTHER): Payer: Self-pay | Admitting: Emergency Medicine

## 2022-12-14 DIAGNOSIS — Z794 Long term (current) use of insulin: Secondary | ICD-10-CM | POA: Diagnosis not present

## 2022-12-14 DIAGNOSIS — T1490XA Injury, unspecified, initial encounter: Secondary | ICD-10-CM | POA: Diagnosis not present

## 2022-12-14 DIAGNOSIS — R739 Hyperglycemia, unspecified: Secondary | ICD-10-CM | POA: Diagnosis not present

## 2022-12-14 DIAGNOSIS — E1140 Type 2 diabetes mellitus with diabetic neuropathy, unspecified: Secondary | ICD-10-CM | POA: Insufficient documentation

## 2022-12-14 DIAGNOSIS — I6782 Cerebral ischemia: Secondary | ICD-10-CM | POA: Diagnosis not present

## 2022-12-14 DIAGNOSIS — S59901A Unspecified injury of right elbow, initial encounter: Secondary | ICD-10-CM | POA: Diagnosis not present

## 2022-12-14 DIAGNOSIS — I2510 Atherosclerotic heart disease of native coronary artery without angina pectoris: Secondary | ICD-10-CM | POA: Insufficient documentation

## 2022-12-14 DIAGNOSIS — S51011A Laceration without foreign body of right elbow, initial encounter: Secondary | ICD-10-CM | POA: Diagnosis not present

## 2022-12-14 DIAGNOSIS — Z7982 Long term (current) use of aspirin: Secondary | ICD-10-CM | POA: Insufficient documentation

## 2022-12-14 DIAGNOSIS — Y92009 Unspecified place in unspecified non-institutional (private) residence as the place of occurrence of the external cause: Secondary | ICD-10-CM | POA: Diagnosis not present

## 2022-12-14 DIAGNOSIS — W01198A Fall on same level from slipping, tripping and stumbling with subsequent striking against other object, initial encounter: Secondary | ICD-10-CM | POA: Diagnosis not present

## 2022-12-14 DIAGNOSIS — W19XXXA Unspecified fall, initial encounter: Secondary | ICD-10-CM | POA: Diagnosis not present

## 2022-12-14 DIAGNOSIS — Z7984 Long term (current) use of oral hypoglycemic drugs: Secondary | ICD-10-CM | POA: Diagnosis not present

## 2022-12-14 DIAGNOSIS — S0990XA Unspecified injury of head, initial encounter: Secondary | ICD-10-CM | POA: Diagnosis not present

## 2022-12-14 DIAGNOSIS — I10 Essential (primary) hypertension: Secondary | ICD-10-CM | POA: Diagnosis not present

## 2022-12-14 DIAGNOSIS — G319 Degenerative disease of nervous system, unspecified: Secondary | ICD-10-CM | POA: Diagnosis not present

## 2022-12-14 DIAGNOSIS — Z79899 Other long term (current) drug therapy: Secondary | ICD-10-CM | POA: Diagnosis not present

## 2022-12-14 MED ORDER — BACITRACIN ZINC 500 UNIT/GM EX OINT
TOPICAL_OINTMENT | Freq: Two times a day (BID) | CUTANEOUS | Status: DC
  Filled 2022-12-14: qty 28.35

## 2022-12-14 MED ORDER — MUPIROCIN CALCIUM 2 % EX CREA: 1.0000 | TOPICAL_CREAM | Freq: Two times a day (BID) | CUTANEOUS | 0 refills | Status: DC

## 2022-12-14 MED ORDER — ACETAMINOPHEN 500 MG PO TABS
1000.0000 mg | ORAL_TABLET | Freq: Once | ORAL | Status: AC
  Administered 2022-12-14: 1000 mg via ORAL
  Filled 2022-12-14: qty 2

## 2022-12-14 NOTE — ED Notes (Signed)
Writer cleaned and dressed patient's skin tears on fingers and right elbow

## 2022-12-14 NOTE — ED Triage Notes (Signed)
CBG [id number], patient just came from [facility] from dinner.

## 2022-12-14 NOTE — ED Triage Notes (Signed)
Patient fell at home and now he has a skin tear on the right elbow.  Bleeding controlled at this time.  Patient fell back on concrete.

## 2022-12-15 DIAGNOSIS — G319 Degenerative disease of nervous system, unspecified: Secondary | ICD-10-CM | POA: Diagnosis not present

## 2022-12-15 DIAGNOSIS — I6782 Cerebral ischemia: Secondary | ICD-10-CM | POA: Diagnosis not present

## 2022-12-15 DIAGNOSIS — T1490XA Injury, unspecified, initial encounter: Secondary | ICD-10-CM | POA: Diagnosis not present

## 2022-12-15 NOTE — ED Provider Notes (Signed)
[facility] Provider Note   CSN: [id number] Arrival date & time: 12/14/22  2115     History  Chief Complaint  Patient presents with   Fall    [name] is a 89 y.o. male.  The history is provided by the patient.  Fall This is a new problem. The current episode started 3 to 5 hours ago. The problem occurs constantly. The problem has been resolved. Pertinent negatives include no chest pain, no abdominal pain and no shortness of breath. Associated symptoms comments: Skin tear of the right elbow . Nothing aggravates the symptoms. Nothing relieves the symptoms. He has tried nothing for the symptoms. The treatment provided no relief.  Patient with anemia Fell backward striking elbow, maybe head.  No back or neck pain.  Skin tear of the right elbow.  Tetanus UTD    Past Medical History:  Diagnosis Date   Anemia    related to frequent nosebleeds-right nostril.   Aortic insufficiency    a. mild by echo 2013.   Arthritis    arthritis -back knees, most joints   Atrial flutter ([place])    a. per report in 2013.   Breathing difficulty 11-10-12   difficult to breath if laying posteriorly, right nare difficulty   Carotid artery disease ([place])    a. Remote R CEA in the 1990s per pt.   Change in hearing    Diabetes mellitus age 70   Diabetic peripheral neuropathy associated with type 2 diabetes mellitus ([place]) 07/08/2022   Dysrhythmia 07/2012   hx. Atrial Flutter x1, converted spontaneously-no ploblems.    H/O hiatal hernia    HOH (hard of hearing) 11-10-12   bilaterally   Hyperlipidemia    Hypertension    Ulcer 1995   bleeding ulcer, none since     Home Medications Prior to Admission medications   Medication Sig Start Date End Date Taking? Authorizing Provider  mupirocin cream (BACTROBAN) 2 % Apply 1 Application topically 2 (two) times daily. 12/14/22  Yes Mcgwire Dasaro, MD  acetaminophen (TYLENOL) 500 MG tablet Take 500 mg by mouth every  6 (six) hours as needed for moderate pain (for pain).    [provider]  aspirin EC 81 MG tablet Take 1 tablet (81 mg total) by mouth daily. 07/13/19   [name], MD  atorvastatin (LIPITOR) 80 MG tablet Take 80 mg by mouth every evening.     [provider]  cycloSPORINE (RESTASIS) 0.05 % ophthalmic emulsion Place 1 drop into both eyes 2 (two) times daily.    [provider]  diltiazem (CARDIZEM LA) 180 MG 24 hr tablet Take 180 mg by mouth daily. 04/29/22   [provider]  furosemide (LASIX) 20 MG tablet Take 20 mg by mouth daily. 04/26/22   [provider]  gabapentin (NEURONTIN) 300 MG capsule Take 600 mg by mouth 2 (two) times daily. 08/01/21   [provider]  gabapentin (NEURONTIN) 300 MG capsule Take 2 capsules by mouth 2 (two) times daily. 08/01/21   [provider]  gabapentin (NEURONTIN) 800 MG tablet Take 800 mg by mouth 2 (two) times daily. 06/03/22   [provider]  gemfibrozil (LOPID) 600 MG tablet Take 600 mg by mouth every evening.    [provider]  insulin detemir (LEVEMIR) 100 UNIT/ML injection Inject 0.2 mLs (20 Units total) into the skin at bedtime. 01/22/22   Terrilee Croak, MD  isosorbide mononitrate (IMDUR) 30 MG 24 hr  tablet TAKE 1 TABLET BY MOUTH EVERY DAY 10/02/22   [name], MD  JARDIANCE 10 MG TABS tablet Take 10 mg by mouth daily. 05/02/22   [provider]  lidocaine (LIDODERM) 5 % Place 1 patch onto the skin daily as needed (for pain- on 12 hours/off 12 hours). 08/07/21   [provider]  losartan (COZAAR) 100 MG tablet Take 1 tablet every day by oral route.    [provider]  losartan (COZAAR) 25 MG tablet Take 1 tablet (25 mg total) by mouth daily before breakfast. 01/22/22 04/22/22  Dahal, Marlowe Aschoff, MD  nitroGLYCERIN (NITROSTAT) 0.3 MG SL tablet Place 0.3 mg under the tongue every 5 (five) minutes as needed for chest pain. 07/31/21   [provider]  Omega-3 1000 MG CAPS Take 2,000 mg by mouth daily.    [provider]  omeprazole (PRILOSEC) 20 MG capsule Take 20 mg by mouth daily.    [provider]  Omeprazole Magnesium (PRILOSEC PO)     [provider]  pioglitazone (ACTOS) 45 MG tablet Take 45 mg by mouth daily. 05/04/22   [provider]  traZODone (DESYREL) 100 MG tablet Take 100 mg by mouth at bedtime as needed for sleep.     [provider]      Allergies    Patient has no known allergies.    Review of Systems   Review of Systems  Constitutional:  Negative for fever.  Respiratory:  Negative for shortness of breath.   Cardiovascular:  Negative for chest pain.  Gastrointestinal:  Negative for abdominal pain.  Musculoskeletal:  Negative for neck pain.  Skin:  Positive for wound.  Neurological:  Negative for weakness and numbness.  All other systems reviewed and are negative.   Physical Exam Updated Vital Signs BP (!) 148/56   Pulse 84   Temp 98 F (36.7 C) (Oral)   Resp 17   SpO2 98%  Physical Exam Vitals and nursing note reviewed.  Constitutional:      General: He is not in acute distress.    Appearance: Normal appearance. He is well-developed. He is not diaphoretic.  HENT:     Head: Normocephalic and atraumatic.     Nose: Nose normal.  Eyes:     Conjunctiva/sclera: Conjunctivae normal.     Pupils: Pupils are equal, round, and reactive to light.  Cardiovascular:     Rate and Rhythm: Normal rate and regular rhythm.  Pulmonary:     Effort: Pulmonary effort is normal.     Breath sounds: Normal breath sounds. No wheezing or rales.  Abdominal:     General: Bowel sounds are normal.     Palpations: Abdomen is soft.     Tenderness: There is no abdominal tenderness. There is no guarding or rebound.  Musculoskeletal:        General: Normal range of motion.     Right shoulder: Normal.     Right upper arm: Normal.     Right elbow: No swelling, deformity  or effusion. Normal range of motion. No tenderness.     Right forearm: No swelling, edema, deformity, tenderness or bony tenderness.     Right wrist: Normal. No snuff box tenderness.       Arms:     Cervical back: Normal, normal range of motion and neck supple.     Thoracic back: Normal.     Lumbar back: Normal.     Comments: Intact pronation and supination of the R  forearm.  No pain with palpation of the radius or ulnar.  No pain over the capitellum.  No pain on the R elbow with passive or active motion.  No swelling skin tear 3 cm distal to the olecreanon  Skin:    General: Skin is warm and dry.  Neurological:     Mental Status: He is alert and oriented to person, place, and time.     ED Results / Procedures / Treatments   Labs (all labs ordered are listed, but only abnormal results are displayed) Labs Reviewed - No data to display  EKG None  Radiology CT Cervical Spine Wo Contrast  Result Date: 12/15/2022 CLINICAL DATA:  Status post trauma. EXAM: CT CERVICAL SPINE WITHOUT CONTRAST TECHNIQUE: Multidetector CT imaging of the cervical spine was performed without intravenous contrast. Multiplanar CT image reconstructions were also generated. RADIATION DOSE REDUCTION: This exam was performed according to the departmental dose-optimization program which includes automated exposure control, adjustment of the mA and/or kV according to patient size and/or use of iterative reconstruction technique. COMPARISON:  None Available. FINDINGS: Alignment: There is approximately 1 mm anterolisthesis of the C4 vertebral body on C5. Skull base and vertebrae: No acute fracture. No primary bone lesion or focal pathologic process. Soft tissues and spinal canal: No prevertebral fluid or swelling. No visible canal hematoma. Disc levels: Mild endplate sclerosis is noted at the level of C4-C5 with moderate severity multilevel endplate sclerosis seen throughout the remainder of the cervical spine. Mild to moderate  severity anterior osteophyte formation is seen at the levels of C5-C6 and C6-C7. There is marked severity narrowing of the anterior atlantoaxial articulation. Marked severity intervertebral disc space narrowing is seen at the levels of C3-C4, C5-C6 and C6-C7. Moderate to marked severity posterior intervertebral disc space narrowing is present at C2-C3. Bilateral marked severity multilevel facet joint hypertrophy is noted. Upper chest: There is mild posterior right apical scarring and/or atelectasis. Other: None. IMPRESSION: 1. No acute fracture within the cervical spine. 2. Marked severity multilevel degenerative changes, as described above. 3. Approximately 1 mm anterolisthesis of the C4 vertebral body on C5. 4. Mild posterior right apical scarring and/or atelectasis. Electronically Signed   By: Virgina Norfolk M.D.   On: 12/15/2022 00:23   CT Head Wo Contrast  Result Date: 12/15/2022 CLINICAL DATA:  Status post trauma. EXAM: CT HEAD WITHOUT CONTRAST TECHNIQUE: Contiguous axial images were obtained from the base of the skull through the vertex without intravenous contrast. RADIATION DOSE REDUCTION: This exam was performed according to the departmental dose-optimization program which includes automated exposure control, adjustment of the mA and/or kV according to patient size and/or use of iterative reconstruction technique. COMPARISON:  Nanette Wirsing 2, 2023 FINDINGS: Brain: There is mild cerebral atrophy with widening of the extra-axial spaces and ventricular dilatation. There are areas of decreased attenuation within the white matter tracts of the supratentorial brain, consistent with microvascular disease changes. Vascular: There is marked severity calcification of the bilateral cavernous carotid arteries. Skull: Normal. Negative for fracture or focal lesion. Sinuses/Orbits: An 11 mm x 6 mm posterior right maxillary sinus polyp versus mucous retention cyst is seen. Moderate severity right ethmoid sinus mucosal  thickening is also noted. Other: None. IMPRESSION: 1. No acute intracranial abnormality. 2. Generalized cerebral atrophy with chronic white matter small vessel ischemic changes. 3. Moderate severity right ethmoid sinus disease. Electronically Signed   By: Virgina Norfolk M.D.   On: 12/15/2022 00:17   DG Elbow Complete Right  Result Date: 12/14/2022 CLINICAL DATA:  Right elbow injury.  Fall today. EXAM: RIGHT ELBOW - COMPLETE 3+ VIEW COMPARISON:  None Available. FINDINGS: Moderate radiocarpal joint space narrowing. Mild radial head-neck junction degenerative spurring. Mild medial elbow joint space narrowing and peripheral osteophytosis. Borderline elevation of the distal anterior humeral fat pad and possible tiny joint effusion. No visualization of the posterior fat pad. No acute fracture line is seen.  No dislocation. IMPRESSION: Possible tiny joint effusion. No acute fracture line is seen. If there is persistent clinical concern for a radiographically occult fracture, recommend immobilization and repeat radiographs in 10-14 days. Electronically Signed   By: Yvonne Kendall M.D.   On: 12/14/2022 21:46    Procedures Procedures    Medications Ordered in ED Medications  bacitracin ointment ( Topical Given 12/14/22 2338)  acetaminophen (TYLENOL) tablet 1,000 mg (1,000 mg Oral Given 12/14/22 2328)    ED Course/ Medical Decision Making/ A&P                             Medical Decision Making Fell striking elbow and maybe head   Amount and/or Complexity of Data Reviewed Independent Historian: spouse    Details: See above  External Data Reviewed: notes.    Details: Previous notes reviewed  Radiology: ordered and independent interpretation performed.    Details: CT head negative by me no fracture of the radius or ulnar by me   Risk OTC drugs. Prescription drug management. Risk Details: I have seen and appreciate radiology note, about possible effusion but patient has FROM of the R elbow both  passively and actively without pain and I highly doubt there is a fracture given FROM without pain or swelling or tenderness to palpation.  Bactroban BID for wound care and keep clean and dry, do not sumerse the wound for 2 weeks in any body of water.  Tylenol for pain.  Stable for discharge.  Strict return.      Final Clinical Impression(s) / ED Diagnoses Final diagnoses:  Fall, initial encounter  Skin tear of right elbow without complication, initial encounter   Return for intractable cough, coughing up blood, fevers > 100.4 unrelieved by medication, shortness of breath, intractable vomiting, chest pain, shortness of breath, weakness, numbness, changes in speech, facial asymmetry, abdominal pain, passing out, Inability to tolerate liquids or food, cough, altered mental status or any concerns. No signs of systemic illness or infection. The patient is nontoxic-appearing on exam and vital signs are within normal limits.  I have reviewed the triage vital signs and the nursing notes. Pertinent labs & imaging results that were available during my care of the patient were reviewed by me and considered in my medical decision making (see chart for details). After history, exam, and medical workup I feel the patient has been appropriately medically screened and is safe for discharge home. Pertinent diagnoses were discussed with the patient. Patient was given return precautions Rx / DC Orders ED Discharge Orders          Ordered    mupirocin cream (BACTROBAN) 2 %  2 times daily        12/14/22 2322              Quillan Whitter, MD 12/15/22 0040

## 2022-12-19 ENCOUNTER — Other Ambulatory Visit: Payer: Self-pay | Admitting: Cardiovascular Disease

## 2023-01-02 DIAGNOSIS — L821 Other seborrheic keratosis: Secondary | ICD-10-CM | POA: Diagnosis not present

## 2023-01-02 DIAGNOSIS — I872 Venous insufficiency (chronic) (peripheral): Secondary | ICD-10-CM | POA: Diagnosis not present

## 2023-01-02 DIAGNOSIS — C44519 Basal cell carcinoma of skin of other part of trunk: Secondary | ICD-10-CM | POA: Diagnosis not present

## 2023-01-02 DIAGNOSIS — Z85828 Personal history of other malignant neoplasm of skin: Secondary | ICD-10-CM | POA: Diagnosis not present

## 2023-01-06 ENCOUNTER — Ambulatory Visit: Payer: Medicare PPO | Admitting: Podiatry

## 2023-01-06 ENCOUNTER — Encounter: Payer: Self-pay | Admitting: Podiatry

## 2023-01-06 VITALS — BP 177/74

## 2023-01-06 DIAGNOSIS — M79674 Pain in right toe(s): Secondary | ICD-10-CM | POA: Diagnosis not present

## 2023-01-06 DIAGNOSIS — S90122A Contusion of left lesser toe(s) without damage to nail, initial encounter: Secondary | ICD-10-CM

## 2023-01-06 DIAGNOSIS — E1142 Type 2 diabetes mellitus with diabetic polyneuropathy: Secondary | ICD-10-CM | POA: Diagnosis not present

## 2023-01-06 DIAGNOSIS — M79675 Pain in left toe(s): Secondary | ICD-10-CM | POA: Diagnosis not present

## 2023-01-06 DIAGNOSIS — B351 Tinea unguium: Secondary | ICD-10-CM | POA: Diagnosis not present

## 2023-01-06 NOTE — Progress Notes (Unsigned)
  Subjective:  Patient ID: [name], male    DOB: April 04, 1930,  MRN: [id number]  [name] presents to clinic today for {jgcomplaint:23593}  Chief Complaint  Patient presents with   Nail Problem    DFC BS-141 A1C-8.5 PCP-Breedlove, Brent PCP VST-3 weeks ago   New problem(s): None. {jgcomplaint:23593}  PCP is Ramiro Harvest, PA-C.  No Known Allergies  Review of Systems: Negative except as noted in the HPI.  Objective: No changes noted in today's physical examination. There were no vitals filed for this visit. [name] is a pleasant 89 y.o. male {jgbodyhabitus:24098} AAO x 3.  Vascular Examination: CFT <3 seconds b/l LE. Faintly palpable DP pulses b/l LE. Faintly palpable PT pulse(s) b/l LE. Pedal hair sparse. No pain with calf compression b/l. Lower extremity skin temperature gradient within normal limits. Trace edema noted BLE.  Dermatological Examination: Pedal skin thin and atrophic b/l LE. No open wounds b/l LE. No interdigital macerations noted b/l LE.   Toenails 1-5 bilaterally elongated, discolored, dystrophic, thickened, and crumbly with subungual debris and tenderness to dorsal palpation.   Incurvated nailplate both borders of left hallux and both borders of right hallux.  Nail border hypertrophy absent. There is tenderness to palpation. Sign(s) of infection: no clinical signs of infection noted on examination today. No hyperkeratotic nor porokeratotic lesions present on today's visit.  Neurological Examination: Pt has subjective symptoms of neuropathy. Protective sensation decreased with 10 gram monofilament b/l. Vibratory sensation intact b/l.  Musculoskeletal Examination: Muscle strength 5/5 to all lower extremity muscle groups bilaterally. No pain, crepitus or joint limitation noted with ROM bilateral LE. Hammertoe deformity noted 2-5 b/l. Utilizes walker for ambulation assistance. Assessment/Plan: 1. Pain due to onychomycosis of toenails of both feet   2.  Type 2 diabetes mellitus with diabetic polyneuropathy, unspecified whether long term insulin use (HCC)     No orders of the defined types were placed in this encounter.   None {Jgplan:23602::"-Patient/POA to call should there be question/concern in the interim."}   Return in about 3 months (around 04/08/2023).  Marzetta Board, DPM

## 2023-01-17 ENCOUNTER — Other Ambulatory Visit: Payer: Self-pay | Admitting: Cardiovascular Disease

## 2023-02-08 ENCOUNTER — Other Ambulatory Visit: Payer: Self-pay | Admitting: Cardiovascular Disease

## 2023-02-19 DIAGNOSIS — I10 Essential (primary) hypertension: Secondary | ICD-10-CM | POA: Diagnosis not present

## 2023-02-19 DIAGNOSIS — E1165 Type 2 diabetes mellitus with hyperglycemia: Secondary | ICD-10-CM | POA: Diagnosis not present

## 2023-02-19 DIAGNOSIS — R0989 Other specified symptoms and signs involving the circulatory and respiratory systems: Secondary | ICD-10-CM | POA: Diagnosis not present

## 2023-02-20 ENCOUNTER — Emergency Department (HOSPITAL_COMMUNITY): Payer: Medicare PPO

## 2023-02-20 ENCOUNTER — Inpatient Hospital Stay (HOSPITAL_COMMUNITY)
Admission: EM | Admit: 2023-02-20 | Discharge: 2023-02-22 | DRG: 193 | Disposition: A | Discharge status: Home Health | Payer: Medicare PPO | Attending: Internal Medicine | Admitting: Internal Medicine

## 2023-02-20 DIAGNOSIS — R0789 Other chest pain: Secondary | ICD-10-CM | POA: Diagnosis not present

## 2023-02-20 DIAGNOSIS — M810 Age-related osteoporosis without current pathological fracture: Secondary | ICD-10-CM | POA: Diagnosis present

## 2023-02-20 DIAGNOSIS — Z794 Long term (current) use of insulin: Secondary | ICD-10-CM | POA: Diagnosis not present

## 2023-02-20 DIAGNOSIS — E1165 Type 2 diabetes mellitus with hyperglycemia: Secondary | ICD-10-CM

## 2023-02-20 DIAGNOSIS — I2510 Atherosclerotic heart disease of native coronary artery without angina pectoris: Secondary | ICD-10-CM | POA: Diagnosis present

## 2023-02-20 DIAGNOSIS — E1142 Type 2 diabetes mellitus with diabetic polyneuropathy: Secondary | ICD-10-CM | POA: Diagnosis present

## 2023-02-20 DIAGNOSIS — Z87891 Personal history of nicotine dependence: Secondary | ICD-10-CM

## 2023-02-20 DIAGNOSIS — Z79899 Other long term (current) drug therapy: Secondary | ICD-10-CM

## 2023-02-20 DIAGNOSIS — H9190 Unspecified hearing loss, unspecified ear: Secondary | ICD-10-CM | POA: Diagnosis present

## 2023-02-20 DIAGNOSIS — R059 Cough, unspecified: Secondary | ICD-10-CM | POA: Diagnosis not present

## 2023-02-20 DIAGNOSIS — I4892 Unspecified atrial flutter: Secondary | ICD-10-CM

## 2023-02-20 DIAGNOSIS — N401 Enlarged prostate with lower urinary tract symptoms: Secondary | ICD-10-CM | POA: Diagnosis present

## 2023-02-20 DIAGNOSIS — E1151 Type 2 diabetes mellitus with diabetic peripheral angiopathy without gangrene: Secondary | ICD-10-CM | POA: Diagnosis present

## 2023-02-20 DIAGNOSIS — Z85820 Personal history of malignant melanoma of skin: Secondary | ICD-10-CM

## 2023-02-20 DIAGNOSIS — Z96651 Presence of right artificial knee joint: Secondary | ICD-10-CM | POA: Diagnosis present

## 2023-02-20 DIAGNOSIS — J168 Pneumonia due to other specified infectious organisms: Secondary | ICD-10-CM | POA: Diagnosis not present

## 2023-02-20 DIAGNOSIS — J189 Pneumonia, unspecified organism: Secondary | ICD-10-CM | POA: Diagnosis present

## 2023-02-20 DIAGNOSIS — R071 Chest pain on breathing: Secondary | ICD-10-CM | POA: Diagnosis not present

## 2023-02-20 DIAGNOSIS — Z7985 Long-term (current) use of injectable non-insulin antidiabetic drugs: Secondary | ICD-10-CM

## 2023-02-20 DIAGNOSIS — E11649 Type 2 diabetes mellitus with hypoglycemia without coma: Secondary | ICD-10-CM | POA: Diagnosis present

## 2023-02-20 DIAGNOSIS — Z66 Do not resuscitate: Secondary | ICD-10-CM | POA: Diagnosis present

## 2023-02-20 DIAGNOSIS — R410 Disorientation, unspecified: Secondary | ICD-10-CM | POA: Diagnosis not present

## 2023-02-20 DIAGNOSIS — G928 Other toxic encephalopathy: Secondary | ICD-10-CM | POA: Diagnosis present

## 2023-02-20 DIAGNOSIS — Z1152 Encounter for screening for COVID-19: Secondary | ICD-10-CM | POA: Diagnosis not present

## 2023-02-20 DIAGNOSIS — Z8249 Family history of ischemic heart disease and other diseases of the circulatory system: Secondary | ICD-10-CM | POA: Diagnosis not present

## 2023-02-20 DIAGNOSIS — R000 Tachycardia, unspecified: Secondary | ICD-10-CM | POA: Diagnosis not present

## 2023-02-20 DIAGNOSIS — F05 Delirium due to known physiological condition: Secondary | ICD-10-CM | POA: Diagnosis present

## 2023-02-20 DIAGNOSIS — Z974 Presence of external hearing-aid: Secondary | ICD-10-CM

## 2023-02-20 DIAGNOSIS — K219 Gastro-esophageal reflux disease without esophagitis: Secondary | ICD-10-CM | POA: Diagnosis present

## 2023-02-20 DIAGNOSIS — R0989 Other specified symptoms and signs involving the circulatory and respiratory systems: Secondary | ICD-10-CM | POA: Diagnosis present

## 2023-02-20 DIAGNOSIS — R9431 Abnormal electrocardiogram [ECG] [EKG]: Secondary | ICD-10-CM | POA: Diagnosis not present

## 2023-02-20 DIAGNOSIS — I480 Paroxysmal atrial fibrillation: Secondary | ICD-10-CM | POA: Diagnosis present

## 2023-02-20 DIAGNOSIS — N138 Other obstructive and reflux uropathy: Secondary | ICD-10-CM | POA: Diagnosis present

## 2023-02-20 DIAGNOSIS — M1990 Unspecified osteoarthritis, unspecified site: Secondary | ICD-10-CM | POA: Diagnosis present

## 2023-02-20 DIAGNOSIS — I080 Rheumatic disorders of both mitral and aortic valves: Secondary | ICD-10-CM | POA: Diagnosis present

## 2023-02-20 DIAGNOSIS — Z85828 Personal history of other malignant neoplasm of skin: Secondary | ICD-10-CM

## 2023-02-20 DIAGNOSIS — Z9049 Acquired absence of other specified parts of digestive tract: Secondary | ICD-10-CM

## 2023-02-20 DIAGNOSIS — Z7982 Long term (current) use of aspirin: Secondary | ICD-10-CM

## 2023-02-20 DIAGNOSIS — Z7984 Long term (current) use of oral hypoglycemic drugs: Secondary | ICD-10-CM

## 2023-02-20 DIAGNOSIS — R079 Chest pain, unspecified: Secondary | ICD-10-CM | POA: Diagnosis present

## 2023-02-20 DIAGNOSIS — R4182 Altered mental status, unspecified: Secondary | ICD-10-CM | POA: Diagnosis present

## 2023-02-20 DIAGNOSIS — T50905A Adverse effect of unspecified drugs, medicaments and biological substances, initial encounter: Secondary | ICD-10-CM

## 2023-02-20 DIAGNOSIS — E119 Type 2 diabetes mellitus without complications: Secondary | ICD-10-CM

## 2023-02-20 DIAGNOSIS — T368X5A Adverse effect of other systemic antibiotics, initial encounter: Secondary | ICD-10-CM | POA: Diagnosis present

## 2023-02-20 DIAGNOSIS — J90 Pleural effusion, not elsewhere classified: Secondary | ICD-10-CM | POA: Diagnosis not present

## 2023-02-20 DIAGNOSIS — I10 Essential (primary) hypertension: Secondary | ICD-10-CM | POA: Diagnosis present

## 2023-02-20 DIAGNOSIS — E785 Hyperlipidemia, unspecified: Secondary | ICD-10-CM | POA: Diagnosis present

## 2023-02-20 LAB — CBC WITH DIFFERENTIAL/PLATELET
Abs Immature Granulocytes: 0.03 10*3/uL (ref 0.00–0.07)
Basophils Absolute: 0 10*3/uL (ref 0.0–0.1)
Basophils Relative: 1 %
Eosinophils Absolute: 0 10*3/uL (ref 0.0–0.5)
Eosinophils Relative: 0 %
HCT: 37.1 % — ABNORMAL LOW (ref 39.0–52.0)
Hemoglobin: 12 g/dL — ABNORMAL LOW (ref 13.0–17.0)
Immature Granulocytes: 1 %
Lymphocytes Relative: 8 %
Lymphs Abs: 0.6 10*3/uL — ABNORMAL LOW (ref 0.7–4.0)
MCH: 31.3 pg (ref 26.0–34.0)
MCHC: 32.3 g/dL (ref 30.0–36.0)
MCV: 96.9 fL (ref 80.0–100.0)
Monocytes Absolute: 0.8 10*3/uL (ref 0.1–1.0)
Monocytes Relative: 11 %
Neutro Abs: 5.2 10*3/uL (ref 1.7–7.7)
Neutrophils Relative %: 79 %
Platelets: 188 10*3/uL (ref 150–400)
RBC: 3.83 MIL/uL — ABNORMAL LOW (ref 4.22–5.81)
RDW: 16.1 % — ABNORMAL HIGH (ref 11.5–15.5)
WBC: 6.6 10*3/uL (ref 4.0–10.5)
nRBC: 0 % (ref 0.0–0.2)

## 2023-02-20 LAB — RESP PANEL BY RT-PCR (RSV, FLU A&B, COVID)  RVPGX2
Influenza A by PCR: NEGATIVE
Influenza B by PCR: NEGATIVE
Resp Syncytial Virus by PCR: NEGATIVE
SARS Coronavirus 2 by RT PCR: NEGATIVE

## 2023-02-20 LAB — COMPREHENSIVE METABOLIC PANEL
ALT: 15 U/L (ref 0–44)
AST: 20 U/L (ref 15–41)
Albumin: 3.7 g/dL (ref 3.5–5.0)
Alkaline Phosphatase: 84 U/L (ref 38–126)
Anion gap: 11 (ref 5–15)
BUN: 25 mg/dL — ABNORMAL HIGH (ref 8–23)
CO2: 24 mmol/L (ref 22–32)
Calcium: 9.5 mg/dL (ref 8.9–10.3)
Chloride: 103 mmol/L (ref 98–111)
Creatinine, Ser: 1.17 mg/dL (ref 0.61–1.24)
GFR, Estimated: 59 mL/min — ABNORMAL LOW (ref >60)
Glucose, Bld: 129 mg/dL — ABNORMAL HIGH (ref 70–99)
Potassium: 3.6 mmol/L (ref 3.5–5.1)
Sodium: 138 mmol/L (ref 135–145)
Total Bilirubin: 1.3 mg/dL — ABNORMAL HIGH (ref 0.3–1.2)
Total Protein: 7.5 g/dL (ref 6.5–8.1)

## 2023-02-20 LAB — URINALYSIS, ROUTINE W REFLEX MICROSCOPIC
Bacteria, UA: NONE SEEN
Bilirubin Urine: NEGATIVE
Glucose, UA: >=500 mg/dL — AB
Ketones, ur: 20 mg/dL — AB
Nitrite: NEGATIVE
Protein, ur: 100 mg/dL — AB
Specific Gravity, Urine: 1.016 (ref 1.005–1.030)
WBC, UA: >50 WBC/hpf (ref 0–5)
pH: 5 (ref 5.0–8.0)

## 2023-02-20 LAB — CREATININE, SERUM
Creatinine, Ser: 1.14 mg/dL (ref 0.61–1.24)
GFR, Estimated: >60 mL/min (ref >60)

## 2023-02-20 LAB — LACTIC ACID, PLASMA
Lactic Acid, Venous: 1.2 mmol/L (ref 0.5–1.9)
Lactic Acid, Venous: 1.3 mmol/L (ref 0.5–1.9)

## 2023-02-20 LAB — CULTURE, BLOOD (ROUTINE X 2)

## 2023-02-20 LAB — CBC
HCT: 33.9 % — ABNORMAL LOW (ref 39.0–52.0)
Hemoglobin: 11.1 g/dL — ABNORMAL LOW (ref 13.0–17.0)
MCH: 31.5 pg (ref 26.0–34.0)
MCHC: 32.7 g/dL (ref 30.0–36.0)
MCV: 96.3 fL (ref 80.0–100.0)
Platelets: 195 10*3/uL (ref 150–400)
RBC: 3.52 MIL/uL — ABNORMAL LOW (ref 4.22–5.81)
RDW: 15.9 % — ABNORMAL HIGH (ref 11.5–15.5)
WBC: 5.6 10*3/uL (ref 4.0–10.5)
nRBC: 0 % (ref 0.0–0.2)

## 2023-02-20 LAB — CBG MONITORING, ED: Glucose-Capillary: 154 mg/dL — ABNORMAL HIGH (ref 70–99)

## 2023-02-20 LAB — STREP PNEUMONIAE URINARY ANTIGEN: Strep Pneumo Urinary Antigen: NEGATIVE

## 2023-02-20 MED ORDER — SODIUM CHLORIDE 0.9 % IV SOLN
500.0000 mg | INTRAVENOUS | Status: DC
  Administered 2023-02-21: 500 mg via INTRAVENOUS
  Filled 2023-02-20 (×2): qty 5

## 2023-02-20 MED ORDER — ONDANSETRON HCL 4 MG PO TABS: 4.0000 mg | ORAL_TABLET | Freq: Four times a day (QID) | ORAL | Status: DC | PRN

## 2023-02-20 MED ORDER — SODIUM CHLORIDE 0.9 % IV SOLN
1.0000 g | Freq: Once | INTRAVENOUS | Status: AC
  Administered 2023-02-20: 1 g via INTRAVENOUS
  Filled 2023-02-20: qty 10

## 2023-02-20 MED ORDER — ISOSORBIDE MONONITRATE ER 30 MG PO TB24
30.0000 mg | ORAL_TABLET | Freq: Every day | ORAL | Status: DC
  Administered 2023-02-21 – 2023-02-22 (×2): 30 mg via ORAL
  Filled 2023-02-20 (×2): qty 1

## 2023-02-20 MED ORDER — PANTOPRAZOLE SODIUM 40 MG PO TBEC
40.0000 mg | DELAYED_RELEASE_TABLET | Freq: Every day | ORAL | Status: DC
  Administered 2023-02-21 – 2023-02-22 (×2): 40 mg via ORAL
  Filled 2023-02-20 (×2): qty 1

## 2023-02-20 MED ORDER — INSULIN DETEMIR 100 UNIT/ML ~~LOC~~ SOLN
20.0000 [IU] | Freq: Every day | SUBCUTANEOUS | Status: DC
  Administered 2023-02-20 – 2023-02-21 (×2): 20 [IU] via SUBCUTANEOUS
  Filled 2023-02-20 (×2): qty 0.2

## 2023-02-20 MED ORDER — FUROSEMIDE 20 MG PO TABS
20.0000 mg | ORAL_TABLET | Freq: Two times a day (BID) | ORAL | Status: DC
  Administered 2023-02-21 (×2): 20 mg via ORAL
  Filled 2023-02-20 (×2): qty 1

## 2023-02-20 MED ORDER — TRAZODONE HCL 100 MG PO TABS: 100.0000 mg | ORAL_TABLET | Freq: Every evening | ORAL | Status: DC | PRN

## 2023-02-20 MED ORDER — EMPAGLIFLOZIN 10 MG PO TABS: 10.0000 mg | ORAL_TABLET | Freq: Every day | ORAL | Status: DC

## 2023-02-20 MED ORDER — SODIUM CHLORIDE 0.9 % IV SOLN
2.0000 g | INTRAVENOUS | Status: DC
  Administered 2023-02-21 – 2023-02-22 (×2): 2 g via INTRAVENOUS
  Filled 2023-02-20 (×2): qty 20

## 2023-02-20 MED ORDER — SODIUM CHLORIDE 0.9 % IV SOLN: INTRAVENOUS | Status: DC

## 2023-02-20 MED ORDER — MAGNESIUM CITRATE PO SOLN: 1.0000 | Freq: Once | ORAL | Status: DC | PRN

## 2023-02-20 MED ORDER — LOSARTAN POTASSIUM 25 MG PO TABS: 100.0000 mg | ORAL_TABLET | Freq: Every day | ORAL | Status: DC

## 2023-02-20 MED ORDER — ACETAMINOPHEN 325 MG PO TABS
650.0000 mg | ORAL_TABLET | Freq: Four times a day (QID) | ORAL | Status: DC | PRN
  Administered 2023-02-21: 650 mg via ORAL
  Filled 2023-02-20: qty 2

## 2023-02-20 MED ORDER — SODIUM CHLORIDE 0.9 % IV SOLN
500.0000 mg | Freq: Once | INTRAVENOUS | Status: AC
  Administered 2023-02-20: 500 mg via INTRAVENOUS
  Filled 2023-02-20: qty 5

## 2023-02-20 MED ORDER — ONDANSETRON HCL 4 MG/2ML IJ SOLN: 4.0000 mg | Freq: Four times a day (QID) | INTRAMUSCULAR | Status: DC | PRN

## 2023-02-20 MED ORDER — SODIUM CHLORIDE 0.9 % IV BOLUS
500.0000 mL | Freq: Once | INTRAVENOUS | Status: AC
  Administered 2023-02-20: 500 mL via INTRAVENOUS

## 2023-02-20 MED ORDER — OMEGA-3-ACID ETHYL ESTERS 1 G PO CAPS
2000.0000 mg | ORAL_CAPSULE | Freq: Every day | ORAL | Status: DC
  Administered 2023-02-21 – 2023-02-22 (×2): 2000 mg via ORAL
  Filled 2023-02-20 (×2): qty 2

## 2023-02-20 MED ORDER — GABAPENTIN 400 MG PO CAPS
800.0000 mg | ORAL_CAPSULE | Freq: Two times a day (BID) | ORAL | Status: DC
  Administered 2023-02-20 – 2023-02-22 (×4): 800 mg via ORAL
  Filled 2023-02-20 (×4): qty 2

## 2023-02-20 MED ORDER — ENOXAPARIN SODIUM 40 MG/0.4ML IJ SOSY
40.0000 mg | PREFILLED_SYRINGE | INTRAMUSCULAR | Status: DC
  Administered 2023-02-20 – 2023-02-21 (×2): 40 mg via SUBCUTANEOUS
  Filled 2023-02-20 (×2): qty 0.4

## 2023-02-20 MED ORDER — DILTIAZEM HCL ER 180 MG PO TB24: 180.0000 mg | ORAL_TABLET | Freq: Every day | ORAL | Status: DC

## 2023-02-20 MED ORDER — DILTIAZEM HCL ER COATED BEADS 180 MG PO CP24
180.0000 mg | ORAL_CAPSULE | Freq: Every day | ORAL | Status: DC
  Administered 2023-02-20 – 2023-02-22 (×3): 180 mg via ORAL
  Filled 2023-02-20 (×3): qty 1

## 2023-02-20 MED ORDER — ACETAMINOPHEN 650 MG RE SUPP: 650.0000 mg | Freq: Four times a day (QID) | RECTAL | Status: DC | PRN

## 2023-02-20 MED ORDER — BISACODYL 10 MG RE SUPP: 10.0000 mg | Freq: Every day | RECTAL | Status: DC | PRN

## 2023-02-20 MED ORDER — ASPIRIN 81 MG PO TBEC
81.0000 mg | DELAYED_RELEASE_TABLET | Freq: Every day | ORAL | Status: DC
  Administered 2023-02-20 – 2023-02-22 (×3): 81 mg via ORAL
  Filled 2023-02-20 (×3): qty 1

## 2023-02-20 MED ORDER — NITROGLYCERIN 0.3 MG SL SUBL: 0.3000 mg | SUBLINGUAL_TABLET | SUBLINGUAL | Status: DC | PRN

## 2023-02-20 MED ORDER — SENNOSIDES-DOCUSATE SODIUM 8.6-50 MG PO TABS: 1.0000 | ORAL_TABLET | Freq: Every evening | ORAL | Status: DC | PRN

## 2023-02-20 MED ORDER — ASPIRIN 81 MG PO TBEC: 81.0000 mg | DELAYED_RELEASE_TABLET | Freq: Every day | ORAL | Status: DC

## 2023-02-20 MED ORDER — ATORVASTATIN CALCIUM 40 MG PO TABS
80.0000 mg | ORAL_TABLET | Freq: Every evening | ORAL | Status: DC
  Administered 2023-02-20 – 2023-02-21 (×2): 80 mg via ORAL
  Filled 2023-02-20 (×2): qty 2

## 2023-02-20 NOTE — Assessment & Plan Note (Addendum)
Recent hemoglobin A1c on 4/delivery of 12.8%  Resume home regimen of Levemir 20 units nightly Jardiance and pioglitazone Diabetic diet Accu-Cheks before every meal and nightly with sliding scale insulin

## 2023-02-20 NOTE — Assessment & Plan Note (Signed)
Right upper lobe infiltrate on CXR as well as some pulmonary vascular congestion. Due to the patient's altered mental status, he has been changed from cipro 500 mg bid as prescribed by PCP to rocephin and azithromycin. Blood cultures x 2 are pending.

## 2023-02-20 NOTE — ED Notes (Signed)
Bladder scan 19 mls.  Pt had just used a urinal approx 20 min earlier w/100 mls output

## 2023-02-20 NOTE — ED Triage Notes (Signed)
Pt BIBA from home for confusion noticed this morning around 0500 by wife. Seen at pcp yesterday for cough, started on cipro. 3 doses taken yesterday. Intermittently disoriented to time today. No complaints. 2ga LAC  166/80 HR 70-110 hx afib CBG 150 94% RA

## 2023-02-20 NOTE — Assessment & Plan Note (Addendum)
Some degree of vascular congestion noted on chest x-[place]  Clinically patient does not appear to be volume overloaded  Will continue Lasix 20 mg twice daily for now  Obtaining BNP

## 2023-02-20 NOTE — Assessment & Plan Note (Addendum)
Currently rate controlled on Diltazem CHA2DS2-VASc score of 4 Only on aspirin per home regimen agreed upon by cardiology

## 2023-02-20 NOTE — H&P (Signed)
History and Physical    Patient: [name] EXB:[id number] DOB: 1930-08-18 DOA: 02/20/2023 DOS: the patient was seen and examined on 02/20/2023 PCP: Sheliah Hatch, PA-C  Patient coming from: Home  Chief Complaint:  Chief Complaint  Patient presents with   Altered Mental Status   HPI: [name] is a 89 y.o. male with medical history significant of atrial fibrillation, basal cell carcinoma of the scalp and neck, DM II, hypertension, GERD, hyperlipidemia, BPH with urinary obstruction, malignant melanoma of the head and neck, Osteoarthritis, osteoporosis.   He was brought to the ED by his wife who found him to be combative and confused this morning. The patient went to see his PCP on 02/19/2023 due to increasing cough and congestion. He was found to have a community acquired pneumonia. The patient was given cipro 500 bid. The patient states that he took one at noon on 5/1, one at 7:00 pm, and one early this morning. So the patient has had 1500 mg cipro within 24 hours. The patient's wife states that the patient was confused and combative this morning. This is why she brought him into the ED. The patient does have pneumonia, but is not short of breath of hypoxic. It is possible that the infection is the cause for the altered mental status, but it is also very likely that the cipro played a significant role in his change in mental status. The patient has been admitted with delirium precautions and rocephin and zithromycin for his pneumonia.   CXR in the ED demonstrated a RUL infiltrate as well as mild pulmonary edema.   The patient and his wife deny shortness of breath, chest pain, cough, nausea, vomiting, hematemesis, hematochezia, melena, coffee ground emesis, palpitations, headache, neurological abnormalities, rashes, sores, or lesions.   In the ED the patient has had very poor urinary output. He was started on 75 cc/hr as he does not seem to be taking much in.   Review of Systems: As mentioned in  the history of present illness. All other systems reviewed and are negative. Past Medical History:  Diagnosis Date   Anemia    related to frequent nosebleeds-right nostril.   Aortic insufficiency    a. mild by echo 2013.   Arthritis    arthritis -back knees, most joints   Atrial flutter (HCC)    a. per report in 2013.   Breathing difficulty 11-10-12   difficult to breath if laying posteriorly, right nare difficulty   Carotid artery disease (HCC)    a. Remote R CEA in the 1990s per pt.   Change in hearing    Diabetes mellitus age 89   Diabetic peripheral neuropathy associated with type 2 diabetes mellitus (HCC) 07/08/2022   Dysrhythmia 07/2012   hx. Atrial Flutter x1, converted spontaneously-no ploblems.    H/O hiatal hernia    HOH (hard of hearing) 11-10-12   bilaterally   Hyperlipidemia    Hypertension    Ulcer 1995   bleeding ulcer, none since   Past Surgical History:  Procedure Laterality Date   CAROTID DUPLEX  06/30/2012   PATENT RIGHT CAROTID ENDARTERECTOMY. 1%-39% LEFT ICA. STABLE   CAROTID ENDARTERECTOMY  11/242008   right with Dacron patch angioplasty   CATARACT EXTRACTION  1998 bilateral  done 3 months apart   CHOLECYSTECTOMY N/A 06/11/2019   Procedure: LAPAROSCOPIC CHOLECYSTECTOMY;  Surgeon: Harriette Bouillon, MD;  Location: [facility] OR;  Service: General;  Laterality: N/A;   ESOPHAGOGASTRODUODENOSCOPY (EGD) WITH PROPOFOL N/A 06/08/2019   Procedure: ESOPHAGOGASTRODUODENOSCOPY (  EGD) WITH PROPOFOL;  Surgeon: Charlott Rakes, MD;  Location: [facility] ENDOSCOPY;  Service: Endoscopy;  Laterality: N/A;   EYE SURGERY     JOINT REPLACEMENT Right 11-18-12   KNEE BURSECTOMY  11/18/2012   Procedure: KNEE BURSECTOMY;  Surgeon: Jacki Cones, MD;  Location: [facility] ORS;  Service: Orthopedics;  Laterality: Right;   NM MYOCAR MULTIPLE W/SPECT  10/08/2012   NORMAL STRESS NUCLEAR STUDY.   TOTAL KNEE ARTHROPLASTY  11/18/2012   Procedure: TOTAL KNEE ARTHROPLASTY;  Surgeon: Jacki Cones, MD;   Location: [facility] ORS;  Service: Orthopedics;  Laterality: Right;   TRANSTHORACIC ECHOCARDIOGRAM  10/08/2012   MODERATE LVH. MODERATE CONCENTRIC HYPERTROPHY.EF 65 TO 70%. GRADE 1 DYSTOLIC DYSFUNCTION. ELEVATED VENTRICULAR END-DIASTOLIC FILLING PRESSURE AND LEFT ATRIAL FILLING PRESSURE. AV- MILD TO MODERATE CALCIFIED ANNULUS. MV- CALCIFIC DEGENERATION.Marland Kitchen LA-MILDLY DILATED.   Social History:  reports that he quit smoking about 46 years ago. His smoking use included cigarettes. He has never used smokeless tobacco. He reports current alcohol use of about 1.0 standard drink of alcohol per week. He reports that he does not use drugs.  No Known Allergies  Family History  Problem Relation Age of Onset   Cancer Mother        Pancreatic   Heart disease Mother    Cancer Brother        pancreactic   Cancer Daughter        Breast    Prior to Admission medications   Medication Sig Start Date End Date Taking? Authorizing Provider  acetaminophen (TYLENOL) 500 MG tablet Take 500 mg by mouth every 6 (six) hours as needed for moderate pain (for pain).    [provider]  aspirin EC 81 MG tablet Take 1 tablet (81 mg total) by mouth daily. 07/13/19   Runell Gess, MD  atorvastatin (LIPITOR) 80 MG tablet Take 80 mg by mouth every evening.     [provider]  cycloSPORINE (RESTASIS) 0.05 % ophthalmic emulsion Place 1 drop into both eyes 2 (two) times daily.    [provider]  diltiazem (CARDIZEM LA) 180 MG 24 hr tablet Take 180 mg by mouth daily. 04/29/22   [provider]  furosemide (LASIX) 20 MG tablet Take 20 mg by mouth daily. 04/26/22   [provider]  gabapentin (NEURONTIN) 300 MG capsule Take 600 mg by mouth 2 (two) times daily. 08/01/21   [provider]  gabapentin (NEURONTIN) 300 MG capsule Take 2 capsules by mouth 2 (two) times daily. 08/01/21   [provider]  gabapentin (NEURONTIN) 800 MG tablet Take 800 mg by mouth 2 (two) times  daily. 06/03/22   [provider]  gemfibrozil (LOPID) 600 MG tablet Take 600 mg by mouth every evening.    [provider]  insulin detemir (LEVEMIR) 100 UNIT/ML injection Inject 0.2 mLs (20 Units total) into the skin at bedtime. 01/22/22   Lorin Glass, MD  isosorbide mononitrate (IMDUR) 30 MG 24 hr tablet TAKE 1 TAB DAILY. PLEASE CONTACT OUR OFFICE TO SCHEDULE AN OVERDUE VISIT FOR FUTURE REFILLS. 02/11/23   Runell Gess, MD  JARDIANCE 10 MG TABS tablet Take 10 mg by mouth daily. 05/02/22   [provider]  lidocaine (LIDODERM) 5 % Place 1 patch onto the skin daily as needed (for pain- on 12 hours/off 12 hours). 08/07/21   [provider]  losartan (COZAAR) 100 MG tablet Take 1 tablet every day by oral route.    [provider]  losartan (  COZAAR) 25 MG tablet Take 1 tablet (25 mg total) by mouth daily before breakfast. 01/22/22 04/22/22  Dahal, Melina Schools, MD  mupirocin cream (BACTROBAN) 2 % Apply 1 Application topically 2 (two) times daily. 12/14/22   Palumbo, April, MD  nitroGLYCERIN (NITROSTAT) 0.3 MG SL tablet Place 0.3 mg under the tongue every 5 (five) minutes as needed for chest pain. 07/31/21   [provider]  Omega-3 1000 MG CAPS Take 2,000 mg by mouth daily.    [provider]  omeprazole (PRILOSEC) 20 MG capsule Take 20 mg by mouth daily.    [provider]  Omeprazole Magnesium (PRILOSEC PO)     [provider]  pioglitazone (ACTOS) 45 MG tablet Take 45 mg by mouth daily. 05/04/22   [provider]  traZODone (DESYREL) 100 MG tablet Take 100 mg by mouth at bedtime as needed for sleep.     [provider]    Physical Exam: Vitals:   02/20/23 1935 02/20/23 1945 02/20/23 2000 02/20/23 2052  BP:   (!) 164/80 (!) 179/87  Pulse: 86 75 86 88  Resp: 20 19 13 17   Temp:    98.2 F (36.8 C)  TempSrc:    Oral  SpO2: 95% 95% 96% 96%  Weight:      Height:       Exam:  Constitutional:  The  patient is awake, alert, and oriented x 1. He is very confused. He is also extremely hard of hearing despite his hearing aids. Eyes:  pupils and irises appear normal Normal lids and conjunctivae ENMT:  grossly normal hearing  Lips appear normal external ears, nose appear normal Oropharynx: mucosa, tongue,posterior pharynx appear normal Neck:  neck appears normal, no masses, normal ROM, supple no thyromegaly Respiratory:  No increased work of breathing. Positive for rales on the right and diffuse wheezes throughout. No tactile fremitus Cardiovascular:  Regular rate and rhythm No murmurs, ectopy, or gallups. No lateral PMI. No thrills. Abdomen:  Abdomen is soft, non-tender, non-distended No hernias, masses, or organomegaly Normoactive bowel sounds.  Musculoskeletal:  No cyanosis, clubbing, or edema Skin:  No rashes, lesions, ulcers palpation of skin: no induration or nodules Neurologic:  CN 2-12 intact Sensation all 4 extremities intact Psychiatric:  Mental status Mood, affect appropriate Orientation to person, place, time  judgment and insight appear intact Data Reviewed:  CBC    Component Value Date/Time   WBC 5.6 02/20/2023 1620   RBC 3.52 (L) 02/20/2023 1620   HGB 11.1 (L) 02/20/2023 1620   HCT 33.9 (L) 02/20/2023 1620   PLT 195 02/20/2023 1620   MCV 96.3 02/20/2023 1620   MCH 31.5 02/20/2023 1620   MCHC 32.7 02/20/2023 1620   RDW 15.9 (H) 02/20/2023 1620   LYMPHSABS 0.6 (L) 02/20/2023 1119   MONOABS 0.8 02/20/2023 1119   EOSABS 0.0 02/20/2023 1119   BASOSABS 0.0 02/20/2023 1119       Latest Ref Rng & Units 02/20/2023    4:20 PM 02/20/2023   11:19 AM 01/22/2022    2:55 AM  BMP  Glucose 70 - 99 mg/dL  161  096   BUN 8 - 23 mg/dL  25  27   Creatinine 0.45 - 1.24 mg/dL 4.09  8.11  9.14   Sodium 135 - 145 mmol/L  138  141   Potassium 3.5 - 5.1 mmol/L  3.6  3.7   Chloride 98 - 111 mmol/L  103  109   CO2 22 - 32 mmol/L  24  24   Calcium 8.9 - 10.3 mg/dL   9.5  9.0       Assessment and Plan: * Delirium, drug-induced This patient was seen in PCP's office on 02/19/2023 due to increasing cough and congestion. He was found to have a community acquired pneumonia. The patient was given cipro 500 bid. The patient states that he took one at noon on 5/1, one at 7:00 pm, and one early this morning. So the patient has had 1500 mg cipro within 24 hours. The patient's wife states that the patient was confused and combative this morning. This is why she brought him into the ED. The patient does have pneumonia, but is not short of breath of hypoxic. It is possible that the infection is the cause for the altered mental status, but it is also very likely that the cipro played a significant role in his change in mental status. The patient has been admitted with delirium precautions and rocephin and zithromycin for his pneumonia.  Pulmonary vascular congestion The patient's CXR demonstrated pulmonary vascular congestion along with his RUL infiltrate. The most recent echocardiogram was performed on 01/21/2022 and demonstrated EF of 60-65% with no regional wall motion abnormalities. The left ventricular diastolic function could not be evaluated. Mild mitral regurg was reported as was mild aortic valve regur. There was aortic dilatation with borderline dilatation of the aortic root. There was mildly elevated pulmonary artery systolic pressure. He takes lasix 20 mg daily. I will increase this to bid due to the CXR findings.  CAP (community acquired pneumonia) Right upper lobe infiltrate on CXR as well as some pulmonary vascular congestion. Due to the patient's altered mental status, he has been changed from cipro 500 mg bid as prescribed by PCP to rocephin and azithromycin. Blood cultures x 2 are pending.  AF (paroxysmal atrial fibrillation) (HCC) The patient's heart rate is well controlled currently on his home dose of diltiazem 180 mg daily. He is not on anticoagulation. I  have discussed this with the patient's wife. She is not aware of a reason why, although she states that the patient does fall a lot. The patient has a CHADSVASC 2 score of 4 placing him at high risk of CVA due to AF, but I will defer this decision to cardiology who follows this patient for his AF.  Type 2 diabetes mellitus without complication, with long-term current use of insulin (HCC) The patient's most recent hemoglobin A1c was obtained on 01/21/2022. It was 12.8. A more recent result has been ordered. The patient is on [place] 20 units sub Q q hs, Jardiance 10 mg daily, and pioglitazone 30 mg daily.      Advance Care Planning:   Code Status: DNR   Consults: None  Family Communication: Wife and family friend at bedside.  Severity of Illness: The appropriate patient status for this patient is INPATIENT. Inpatient status is judged to be reasonable and necessary in order to provide the required intensity of service to ensure the patient's safety. The patient's presenting symptoms, physical exam findings, and initial radiographic and laboratory data in the context of their chronic comorbidities is felt to place them at high risk for further clinical deterioration. Furthermore, it is not anticipated that the patient will be medically stable for discharge from the hospital within 2 midnights of admission.   * I certify that at the point of admission it is my clinical judgment that the patient will require inpatient hospital care spanning beyond 2 midnights from the point of  admission due to high intensity of service, high risk for further deterioration and high frequency of surveillance required.*  Author: Elizabella Nolet, DO 02/20/2023 9:50 PM  For on call review [email or web address].

## 2023-02-20 NOTE — Assessment & Plan Note (Addendum)
Multiple possible causes including side effect due to ciprofloxacin or encephalopathy secondary to ongoing pneumonia.   Patient has been transitioned off of ciprofloxacin  Treating underlying pneumonia  Currently clinically improving

## 2023-02-20 NOTE — ED Provider Notes (Signed)
[place] EMERGENCY DEPARTMENT AT [facility],The Provider Note   CSN: [id number] Arrival date & time: 02/20/23  4098     History  Chief Complaint  Patient presents with   Altered Mental Status    [name] is a 89 y.o. male here for evaluation altered mental status.  Brought in by EMS from home.  Noted confusion earlier today by wife at 0500 when he awoke.  Apparently was seen by PCP yesterday for cough/ PNA and was started on Cipro.  He took 3 doses yesterday.  Intermittently disoriented today.  States the year is 2003.  He has some generalized weakness.  Has had good appetite, no fever, headache, chest pain.  He does not feel short of breath.  No abdominal pain, loose stool, urinary symptoms.  Wife states patient is typically ANO x 4.  Significantly hard of hearing  HPI     Home Medications Prior to Admission medications   Medication Sig Start Date End Date Taking? Authorizing Provider  acetaminophen (TYLENOL) 500 MG tablet Take 500 mg by mouth every 6 (six) hours as needed for moderate pain (for pain).    [provider]  aspirin EC 81 MG tablet Take 1 tablet (81 mg total) by mouth daily. 07/13/19   Runell Gess, MD  atorvastatin (LIPITOR) 80 MG tablet Take 80 mg by mouth every evening.     [provider]  cycloSPORINE (RESTASIS) 0.05 % ophthalmic emulsion Place 1 drop into both eyes 2 (two) times daily.    [provider]  diltiazem (CARDIZEM LA) 180 MG 24 hr tablet Take 180 mg by mouth daily. 04/29/22   [provider]  furosemide (LASIX) 20 MG tablet Take 20 mg by mouth daily. 04/26/22   [provider]  gabapentin (NEURONTIN) 300 MG capsule Take 600 mg by mouth 2 (two) times daily. 08/01/21   [provider]  gabapentin (NEURONTIN) 300 MG capsule Take 2 capsules by mouth 2 (two) times daily. 08/01/21   [provider]  gabapentin (NEURONTIN) 800 MG tablet Take 800 mg by mouth 2 (two) times daily.  06/03/22   [provider]  gemfibrozil (LOPID) 600 MG tablet Take 600 mg by mouth every evening.    [provider]  insulin detemir (LEVEMIR) 100 UNIT/ML injection Inject 0.2 mLs (20 Units total) into the skin at bedtime. 01/22/22   Lorin Glass, MD  isosorbide mononitrate (IMDUR) 30 MG 24 hr tablet TAKE 1 TAB DAILY. PLEASE CONTACT OUR OFFICE TO SCHEDULE AN OVERDUE VISIT FOR FUTURE REFILLS. 02/11/23   Runell Gess, MD  JARDIANCE 10 MG TABS tablet Take 10 mg by mouth daily. 05/02/22   [provider]  lidocaine (LIDODERM) 5 % Place 1 patch onto the skin daily as needed (for pain- on 12 hours/off 12 hours). 08/07/21   [provider]  losartan (COZAAR) 100 MG tablet Take 1 tablet every day by oral route.    [provider]  losartan (COZAAR) 25 MG tablet Take 1 tablet (25 mg total) by mouth daily before breakfast. 01/22/22 04/22/22  Dahal, Melina Schools, MD  mupirocin cream (BACTROBAN) 2 % Apply 1 Application topically 2 (two) times daily. 12/14/22   Palumbo, April, MD  nitroGLYCERIN (NITROSTAT) 0.3 MG SL tablet Place 0.3 mg under the tongue every 5 (five) minutes as needed for chest pain. 07/31/21   [provider]  Omega-3 1000 MG CAPS Take 2,000 mg by mouth daily.    [provider]  omeprazole (PRILOSEC)  20 MG capsule Take 20 mg by mouth daily.    [provider]  Omeprazole Magnesium (PRILOSEC PO)     [provider]  pioglitazone (ACTOS) 45 MG tablet Take 45 mg by mouth daily. 05/04/22   [provider]  traZODone (DESYREL) 100 MG tablet Take 100 mg by mouth at bedtime as needed for sleep.     [provider]      Allergies    Patient has no known allergies.    Review of Systems   Review of Systems  Constitutional: Negative.   HENT: Negative.    Respiratory:  Positive for cough. Negative for apnea, choking, chest tightness, shortness of breath, wheezing and stridor.   Cardiovascular: Negative.    Gastrointestinal: Negative.   Genitourinary: Negative.   Musculoskeletal: Negative.   Skin: Negative.   Neurological:  Positive for weakness (generalized).  All other systems reviewed and are negative.   Physical Exam Updated Vital Signs BP (!) 164/65   Pulse 74   Temp 97.8 F (36.6 C) (Oral)   Resp 11   Ht 6' (1.829 m)   Wt 88 kg   SpO2 98%   BMI 26.31 kg/m  Physical Exam Vitals and nursing note reviewed.  Constitutional:      General: He is not in acute distress.    Appearance: He is well-developed. He is not ill-appearing, toxic-appearing or diaphoretic.  HENT:     Head: Normocephalic and atraumatic.  Eyes:     Pupils: Pupils are equal, round, and reactive to light.  Cardiovascular:     Rate and Rhythm: Normal rate and regular rhythm.     Pulses: Normal pulses.     Heart sounds: Normal heart sounds.  Pulmonary:     Effort: Pulmonary effort is normal. No respiratory distress.     Breath sounds: Normal breath sounds.     Comments: Mild rhonchi, clears with cough Abdominal:     General: Bowel sounds are normal. There is no distension.     Palpations: Abdomen is soft.  Musculoskeletal:        General: No swelling, tenderness, deformity or signs of injury. Normal range of motion.     Cervical back: Normal range of motion and neck supple.     Right lower leg: No edema.     Left lower leg: No edema.  Skin:    General: Skin is warm and dry.     Capillary Refill: Capillary refill takes less than 2 seconds.  Neurological:     Mental Status: He is alert.     Comments: CN 2-12 grossly intact Pleasantly confused, alert to person, place however states year is 2003 Follows commands Equal strength BIL     ED Results / Procedures / Treatments   Labs (all labs ordered are listed, but only abnormal results are displayed) Labs Reviewed  COMPREHENSIVE METABOLIC PANEL - Abnormal; Notable for the following components:      Result Value   Glucose, Bld 129 (*)    BUN 25  (*)    Total Bilirubin 1.3 (*)    GFR, Estimated 59 (*)    All other components within normal limits  CBC WITH DIFFERENTIAL/PLATELET - Abnormal; Notable for the following components:   RBC 3.83 (*)    Hemoglobin 12.0 (*)    HCT 37.1 (*)    RDW 16.1 (*)    Lymphs Abs 0.6 (*)    All other components within normal limits  URINALYSIS, ROUTINE W REFLEX MICROSCOPIC -  Abnormal; Notable for the following components:   Glucose, UA >=500 (*)    Hgb urine dipstick SMALL (*)    Ketones, ur 20 (*)    Protein, ur 100 (*)    Leukocytes,Ua MODERATE (*)    All other components within normal limits  CULTURE, BLOOD (ROUTINE X 2)  CULTURE, BLOOD (ROUTINE X 2)  SARS CORONAVIRUS 2 BY RT PCR  LACTIC ACID, PLASMA  LACTIC ACID, PLASMA    EKG None  Radiology CT HEAD WO CONTRAST ( )  Result Date: 02/20/2023 CLINICAL DATA:  Delirium. EXAM: CT HEAD WITHOUT CONTRAST TECHNIQUE: Contiguous axial images were obtained from the base of the skull through the vertex without intravenous contrast. RADIATION DOSE REDUCTION: This exam was performed according to the departmental dose-optimization program which includes automated exposure control, adjustment of the mA and/or kV according to patient size and/or use of iterative reconstruction technique. COMPARISON:  Head CT 12/15/2022 FINDINGS: Brain: There is no evidence of an acute infarct, intracranial hemorrhage, mass, midline shift, or extra-axial fluid collection. Mild cerebral atrophy and minimal cerebral white matter hypodensities are considered to be within normal limits for age. Vascular: Calcified atherosclerosis at the skull base. No hyperdense vessel. Skull: No fracture or suspicious osseous lesion. Sinuses/Orbits: Mild mucosal thickening in the paranasal sinuses. Clear mastoid air cells. Unremarkable orbits. Other: None. IMPRESSION: No evidence of acute intracranial abnormality. Electronically Signed   By: Sebastian Ache M.D.   On: 02/20/2023 13:30   DG Chest 2  View  Result Date: 02/20/2023 CLINICAL DATA:  Cough EXAM: CHEST - 2 VIEW COMPARISON:  X-Amarri 01/20/2022 FINDINGS: Stable cardiopericardial silhouette with calcified aorta. Tiny pleural effusions are new. Vascular congestion seen with trace edema. More focal opacity along the inferior aspect of the right upper lobe as well. Secondary infiltrates possible. Degenerative changes of the spine on lateral view. Stable mild midthoracic spine compression deformities. Overlapping cardiac leads IMPRESSION: Developing vascular congestion, trace edema and small effusions. Additional focal opacity along the inferior aspect of the right upper lobe. An acute infiltrate is possible. Recommend follow-up. Electronically Signed   By: Karen Kays M.D.   On: 02/20/2023 11:19    Procedures Procedures    Medications Ordered in ED Medications  sodium chloride 0.9 % bolus 500 mL (0 mLs Intravenous Stopped 02/20/23 1351)  cefTRIAXone (ROCEPHIN) 1 g in sodium chloride 0.9 % 100 mL IVPB (1 g Intravenous New Bag/Given 02/20/23 1352)  azithromycin (ZITHROMAX) 500 mg in sodium chloride 0.9 % 250 mL IVPB (500 mg Intravenous New Bag/Given 02/20/23 1356)    ED Course/ Medical Decision Making/ A&P    89 year old male here for evaluation via EMS from home for confusion.  Seen by PCP yesterday for generalized weakness, chills and cough.  According to family patient was started on ciprofloxacin for pneumonia.  Not have urine obtained by PCP.  Wife states he had 3 doses yesterday, when he woke this morning he was confused, did not know where he was or the year.  Symptoms have improved however he is still confused.  Patient has no complaints.  He is intermittently coughing in the room.  Rhonchi on exam however clears with cough.  He is afebrile, nonseptic, not ill-appearing.  Labs and imaging personally viewed and interpreted:  CBC without leukocytosis, hemoglobin 12 up from baseline CMP tbili 1.3 UA moderate leuks, greater than 50 WBC, no  bacteria Lactic 1.2 Chest x-Jamarkis with possible developing infiltrate right upper lobe, small effusions bilaterally CT head without significant abnormality  Discussed results  with patient.  He still has some mild confusion.  I suspect this is likely multifactorial in setting of Cipro, possible pneumonia.  Will admit to hospitalist for observation. He has no focal deficits to suggest CVA.  CONSULT with Dr. Gerri Lins with [facility] who will evaluate patient for admission.  The patient appears reasonably stabilized for admission considering the current resources, flow, and capabilities available in the ED at this time, and I doubt any other [facility] requiring further screening and/or treatment in the ED prior to admission.                              Medical Decision Making Amount and/or Complexity of Data Reviewed Independent Historian: spouse and EMS External Data Reviewed: labs, radiology, ECG and notes. Labs: ordered. Decision-making details documented in ED Course. Radiology: ordered and independent interpretation performed. Decision-making details documented in ED Course. ECG/medicine tests: ordered and independent interpretation performed. Decision-making details documented in ED Course.  Risk OTC drugs. Prescription drug management. Parenteral controlled substances. Decision regarding hospitalization. Diagnosis or treatment significantly limited by social determinants of health.           Final Clinical Impression(s) / ED Diagnoses Final diagnoses:  Altered mental status, unspecified altered mental status type  Pneumonia of right upper lobe due to infectious organism  Adverse effect of drug, initial encounter    Rx / DC Orders ED Discharge Orders     None         Elodie Panameno A, PA-C 02/20/23 1505    Benjiman Core, MD 02/22/23 [phone]

## 2023-02-21 ENCOUNTER — Other Ambulatory Visit: Payer: Self-pay

## 2023-02-21 ENCOUNTER — Encounter (HOSPITAL_COMMUNITY): Payer: Self-pay | Admitting: Internal Medicine

## 2023-02-21 DIAGNOSIS — M810 Age-related osteoporosis without current pathological fracture: Secondary | ICD-10-CM | POA: Diagnosis present

## 2023-02-21 DIAGNOSIS — N138 Other obstructive and reflux uropathy: Secondary | ICD-10-CM | POA: Diagnosis present

## 2023-02-21 DIAGNOSIS — R4182 Altered mental status, unspecified: Secondary | ICD-10-CM | POA: Diagnosis present

## 2023-02-21 DIAGNOSIS — R0789 Other chest pain: Secondary | ICD-10-CM | POA: Diagnosis not present

## 2023-02-21 DIAGNOSIS — I480 Paroxysmal atrial fibrillation: Secondary | ICD-10-CM

## 2023-02-21 DIAGNOSIS — J189 Pneumonia, unspecified organism: Secondary | ICD-10-CM | POA: Diagnosis present

## 2023-02-21 DIAGNOSIS — R071 Chest pain on breathing: Secondary | ICD-10-CM | POA: Diagnosis not present

## 2023-02-21 DIAGNOSIS — I10 Essential (primary) hypertension: Secondary | ICD-10-CM

## 2023-02-21 DIAGNOSIS — N401 Enlarged prostate with lower urinary tract symptoms: Secondary | ICD-10-CM | POA: Diagnosis present

## 2023-02-21 DIAGNOSIS — Z8249 Family history of ischemic heart disease and other diseases of the circulatory system: Secondary | ICD-10-CM | POA: Diagnosis not present

## 2023-02-21 DIAGNOSIS — Z794 Long term (current) use of insulin: Secondary | ICD-10-CM | POA: Diagnosis not present

## 2023-02-21 DIAGNOSIS — I2510 Atherosclerotic heart disease of native coronary artery without angina pectoris: Secondary | ICD-10-CM | POA: Diagnosis present

## 2023-02-21 DIAGNOSIS — G928 Other toxic encephalopathy: Secondary | ICD-10-CM | POA: Diagnosis present

## 2023-02-21 DIAGNOSIS — H9190 Unspecified hearing loss, unspecified ear: Secondary | ICD-10-CM | POA: Diagnosis present

## 2023-02-21 DIAGNOSIS — I080 Rheumatic disorders of both mitral and aortic valves: Secondary | ICD-10-CM | POA: Diagnosis present

## 2023-02-21 DIAGNOSIS — Z66 Do not resuscitate: Secondary | ICD-10-CM | POA: Diagnosis present

## 2023-02-21 DIAGNOSIS — Z7984 Long term (current) use of oral hypoglycemic drugs: Secondary | ICD-10-CM | POA: Diagnosis not present

## 2023-02-21 DIAGNOSIS — E1165 Type 2 diabetes mellitus with hyperglycemia: Secondary | ICD-10-CM

## 2023-02-21 DIAGNOSIS — E785 Hyperlipidemia, unspecified: Secondary | ICD-10-CM | POA: Diagnosis present

## 2023-02-21 DIAGNOSIS — R0989 Other specified symptoms and signs involving the circulatory and respiratory systems: Secondary | ICD-10-CM | POA: Diagnosis not present

## 2023-02-21 DIAGNOSIS — Z1152 Encounter for screening for COVID-19: Secondary | ICD-10-CM | POA: Diagnosis not present

## 2023-02-21 DIAGNOSIS — Z79899 Other long term (current) drug therapy: Secondary | ICD-10-CM | POA: Diagnosis not present

## 2023-02-21 DIAGNOSIS — M1990 Unspecified osteoarthritis, unspecified site: Secondary | ICD-10-CM | POA: Diagnosis present

## 2023-02-21 DIAGNOSIS — F05 Delirium due to known physiological condition: Secondary | ICD-10-CM | POA: Diagnosis present

## 2023-02-21 DIAGNOSIS — K219 Gastro-esophageal reflux disease without esophagitis: Secondary | ICD-10-CM | POA: Diagnosis present

## 2023-02-21 DIAGNOSIS — E1142 Type 2 diabetes mellitus with diabetic polyneuropathy: Secondary | ICD-10-CM | POA: Diagnosis present

## 2023-02-21 DIAGNOSIS — E1151 Type 2 diabetes mellitus with diabetic peripheral angiopathy without gangrene: Secondary | ICD-10-CM | POA: Diagnosis present

## 2023-02-21 DIAGNOSIS — E11649 Type 2 diabetes mellitus with hypoglycemia without coma: Secondary | ICD-10-CM | POA: Diagnosis present

## 2023-02-21 LAB — BASIC METABOLIC PANEL
Anion gap: 12 (ref 5–15)
BUN: 29 mg/dL — ABNORMAL HIGH (ref 8–23)
CO2: 23 mmol/L (ref 22–32)
Calcium: 9.2 mg/dL (ref 8.9–10.3)
Chloride: 106 mmol/L (ref 98–111)
Creatinine, Ser: 0.97 mg/dL (ref 0.61–1.24)
GFR, Estimated: >60 mL/min (ref >60)
Glucose, Bld: 73 mg/dL (ref 70–99)
Potassium: 3.5 mmol/L (ref 3.5–5.1)
Sodium: 141 mmol/L (ref 135–145)

## 2023-02-21 LAB — BLOOD GAS, VENOUS
Acid-Base Excess: 2 mmol/L (ref 0.0–2.0)
Bicarbonate: 26.5 mmol/L (ref 20.0–28.0)
O2 Saturation: 59.8 %
Patient temperature: 37.1
pCO2, Ven: 40 mmHg — ABNORMAL LOW (ref 44–60)
pH, Ven: 7.43 (ref 7.25–7.43)
pO2, Ven: 35 mmHg (ref 32–45)

## 2023-02-21 LAB — CBC
HCT: 35.6 % — ABNORMAL LOW (ref 39.0–52.0)
Hemoglobin: 11.4 g/dL — ABNORMAL LOW (ref 13.0–17.0)
MCH: 31.6 pg (ref 26.0–34.0)
MCHC: 32 g/dL (ref 30.0–36.0)
MCV: 98.6 fL (ref 80.0–100.0)
Platelets: 172 10*3/uL (ref 150–400)
RBC: 3.61 MIL/uL — ABNORMAL LOW (ref 4.22–5.81)
RDW: 15.6 % — ABNORMAL HIGH (ref 11.5–15.5)
WBC: 6.8 10*3/uL (ref 4.0–10.5)
nRBC: 0 % (ref 0.0–0.2)

## 2023-02-21 LAB — RAPID URINE DRUG SCREEN, HOSP PERFORMED
Amphetamines: NOT DETECTED
Barbiturates: NOT DETECTED
Benzodiazepines: NOT DETECTED
Cocaine: NOT DETECTED
Opiates: NOT DETECTED
Tetrahydrocannabinol: NOT DETECTED

## 2023-02-21 LAB — GLUCOSE, CAPILLARY: Glucose-Capillary: 152 mg/dL — ABNORMAL HIGH (ref 70–99)

## 2023-02-21 LAB — VITAMIN B12: Vitamin B-12: 211 pg/mL (ref 180–914)

## 2023-02-21 LAB — TROPONIN I (HIGH SENSITIVITY)
Troponin I (High Sensitivity): 30 ng/L — ABNORMAL HIGH (ref <18)
Troponin I (High Sensitivity): 29 ng/L — ABNORMAL HIGH (ref <18)

## 2023-02-21 LAB — FOLATE: Folate: 12.4 ng/mL (ref >5.9)

## 2023-02-21 LAB — AMMONIA: Ammonia: 18 umol/L (ref 9–35)

## 2023-02-21 LAB — CULTURE, BLOOD (ROUTINE X 2): Culture: NO GROWTH

## 2023-02-21 LAB — TSH: TSH: 1.216 u[IU]/mL (ref 0.350–4.500)

## 2023-02-21 MED ORDER — INSULIN ASPART 100 UNIT/ML IJ SOLN: 0.0000 [IU] | Freq: Three times a day (TID) | INTRAMUSCULAR | Status: DC

## 2023-02-21 MED ORDER — EMPAGLIFLOZIN 25 MG PO TABS
25.0000 mg | ORAL_TABLET | Freq: Every day | ORAL | Status: DC
  Administered 2023-02-21 – 2023-02-22 (×2): 25 mg via ORAL
  Filled 2023-02-21 (×2): qty 1

## 2023-02-21 MED ORDER — VITAMIN B-12 1000 MCG PO TABS
1000.0000 ug | ORAL_TABLET | Freq: Every day | ORAL | Status: DC
  Administered 2023-02-21 – 2023-02-22 (×2): 1000 ug via ORAL
  Filled 2023-02-21 (×2): qty 1

## 2023-02-21 MED ORDER — ORAL CARE MOUTH RINSE: 15.0000 mL | OROMUCOSAL | Status: DC | PRN

## 2023-02-21 MED ORDER — LOSARTAN POTASSIUM 25 MG PO TABS
25.0000 mg | ORAL_TABLET | Freq: Every day | ORAL | Status: DC
  Administered 2023-02-21 – 2023-02-22 (×2): 25 mg via ORAL
  Filled 2023-02-21 (×2): qty 1

## 2023-02-21 MED ORDER — LOSARTAN POTASSIUM 25 MG PO TABS: 25.0000 mg | ORAL_TABLET | Freq: Every day | ORAL | Status: DC

## 2023-02-21 NOTE — Assessment & Plan Note (Signed)
Continue home regimen of Imdur, aspirin and statin As needed nitroglycerin for episodes of chest pain

## 2023-02-21 NOTE — Progress Notes (Signed)
PROGRESS NOTE   [name]  WUJ:[id number] DOB: 1929-11-03 DOA: 02/20/2023 PCP: Sheliah Hatch, PA-C   Date of Service: the patient was seen and examined on 02/21/2023  Brief Narrative:  89 year old male with past medical history of atrial fibrillation, basal cell carcinoma and melanoma diabetes mellitus type 2, hypertension, gastroesophageal reflux disease, hyperlipidemia, benign prostatic hyperplasia with urinary obstruction who presented to [facility] emergency department with his wife due to aggressiveness and confusion after recently having been diagnosed with pneumonia on 5/1 by his PCP.  Upon evaluation in the emergency department chest x-Damarri revealed a right upper lobe infiltrate concerning for persisting right upper lobe pneumonia.  The hospitalist group was then called to assess the patient for admission the hospital.  Patient's encephalopathy was felt to be secondary to combination of underlying infection as well as initial treatment of his pneumonia with ciprofloxacin.  Ciprofloxacin was discontinued and patient was instead treated with ceftriaxone and azithromycin for his pneumonia.  There is also some question of pulmonary edema on initial chest x-Sander which the patient received additional doses of Lasix.    Assessment and Plan: * Pneumonia of right upper lobe due to infectious organism Continue ceftriaxone and azithromycin Patient is been transitioned off of ciprofloxacin due to concerns that it may be contributing to his confusion Strep pneumoniae urine negative COVID-19 and influenza testing negative Legionella testing pending Blood cultures without growth  Toxic metabolic encephalopathy Multiple possible causes including side effect due to ciprofloxacin or encephalopathy secondary to ongoing pneumonia.   Patient has been transitioned off of ciprofloxacin  Treating underlying pneumonia  Currently clinically improving   Pulmonary vascular congestion Some  degree of vascular congestion noted on chest x-Cailean  Clinically patient does not appear to be volume overloaded  Will continue Lasix 20 mg twice daily for now  Obtaining BNP   Chest pain Patient complaining of intermittent chest discomfort Complaints are vague but likely musculoskeletal or pleuritic due to ongoing pneumonia Will obtain serial troponins considering history of coronary artery disease and obtain EKG  Coronary artery disease involving native coronary artery of native heart Continue home regimen of Imdur, aspirin and statin As needed nitroglycerin for episodes of chest pain  AF (paroxysmal atrial fibrillation) (HCC) Currently rate controlled on Diltazem CHA2DS2-VASc score of 4 Only on aspirin per home regimen agreed upon by cardiology  Uncontrolled type 2 diabetes mellitus with hyperglycemia, with long-term current use of insulin (HCC) Recent hemoglobin A1c on 4/delivery of 12.8%  Resume home regimen of Levemir 20 units nightly Jardiance and pioglitazone Diabetic diet Accu-Cheks before every meal and nightly with sliding scale insulin   Essential hypertension Continue home regimen of losartan As needed hydralazine intravenously for markedly elevated blood pressure.    Subjective:  Patient is complaining of intermittent mild chest discomfort, located in the anterior chest, sore in quality, radiating around the chest, worse with deep inspiration or cough.  Physical Exam:  Vitals:   02/21/23 0531 02/21/23 1321 02/21/23 1735 02/21/23 2050  BP: (!) 121/52 (!) 142/68  (!) 148/65  Pulse:  (!) 57  (!) 48  Resp: 14 20  20   Temp: 98.7 F (37.1 C) 98.9 F (37.2 C)  98.8 F (37.1 C)  TempSrc: Oral Oral  Oral  SpO2: 95% 95% 93% 93%  Weight:      Height:        Constitutional: Awake alert and oriented x3, no associated distress.   Skin: no rashes, no lesions, good skin turgor noted. Eyes: Pupils  are equally reactive to light.  No evidence of scleral icterus or  conjunctival pallor.  ENMT: Moist mucous membranes noted.  Posterior pharynx clear of any exudate or lesions.   Respiratory: scattered Bilaterally without wheezing or rales normal respiratory effort. No accessory muscle use.  Cardiovascular: Regular rate and rhythm, no murmurs / rubs / gallops. No extremity edema. 2+ pedal pulses. No carotid bruits.  Abdomen: Abdomen is soft and nontender.  No evidence of intra-abdominal masses.  Positive bowel sounds noted in all quadrants.   Musculoskeletal: No joint deformity upper and lower extremities. Good ROM, no contractures. Normal muscle tone.    Data Reviewed:  I have personally reviewed and interpreted labs, imaging.  Significant findings are   CBC: Recent Labs  Lab 02/20/23 1119 02/20/23 1620 02/21/23 0335  WBC 6.6 5.6 6.8  NEUTROABS 5.2  --   --   HGB 12.0* 11.1* 11.4*  HCT 37.1* 33.9* 35.6*  MCV 96.9 96.3 98.6  PLT 188 195 172   Basic Metabolic Panel: Recent Labs  Lab 02/20/23 1119 02/20/23 1620 02/21/23 0335  NA 138  --  141  K 3.6  --  3.5  CL 103  --  106  CO2 24  --  23  GLUCOSE 129*  --  73  BUN 25*  --  29*  CREATININE 1.17 1.14 0.97  CALCIUM 9.5  --  9.2   GFR: Estimated Creatinine Clearance: 54.4 mL/min (by C-G formula based on SCr of 0.97 mg/dL). Liver Function Tests: Recent Labs  Lab 02/20/23 1119  AST 20  ALT 15  ALKPHOS 84  BILITOT 1.3*  PROT 7.5  ALBUMIN 3.7    Coagulation Profile: No results for input(s): "INR", "PROTIME" in the last 168 hours.   EKG/Telemetry: Personally reviewed.  Rhythm is atrial fibrillation without dynamic ST segment change.   Code Status:  DNR.  Code status decision has been confirmed with: patient Family Communication: Wife and daughter are at the bedside and have both been updated on plan of care.   Severity of Illness:  The appropriate patient status for this patient is INPATIENT. Inpatient status is judged to be reasonable and necessary in order to provide the  required intensity of service to ensure the patient's safety. The patient's presenting symptoms, physical exam findings, and initial radiographic and laboratory data in the context of their chronic comorbidities is felt to place them at high risk for further clinical deterioration. Furthermore, it is not anticipated that the patient will be medically stable for discharge from the hospital within 2 midnights of admission.   * I certify that at the point of admission it is my clinical judgment that the patient will require inpatient hospital care spanning beyond 2 midnights from the point of admission due to high intensity of service, high risk for further deterioration and high frequency of surveillance required.*  Time spent:  51 minutes  Author:  Marinda Elk MD  02/21/2023 9:46 PM

## 2023-02-21 NOTE — Hospital Course (Addendum)
89 year old male with past medical history of peripheral vascular disease status post right carotid endarterectomy, coronary artery disease medically managed (CT coronaries 07/2021), atrial fibrillation, basal cell carcinoma and melanoma diabetes mellitus type 2, hypertension, gastroesophageal reflux disease, hyperlipidemia, benign prostatic hyperplasia with urinary obstruction who presented to [facility] emergency department with his wife due to aggressiveness and confusion after recently having been diagnosed with pneumonia on 5/1 by his PCP.  Upon evaluation in the emergency department chest x-Almer revealed a right upper lobe infiltrate concerning for persisting right upper lobe pneumonia.  The hospitalist group was then called to assess the patient for admission the hospital.  Patient's encephalopathy was felt to be secondary to combination of underlying infection as well as initial treatment of his pneumonia with ciprofloxacin.  Ciprofloxacin was discontinued and patient was instead treated with ceftriaxone and azithromycin for his pneumonia.  There is also some question of pulmonary edema on initial chest x-Almir which the patient received additional doses of Lasix.  Throughout the hospitalization patient's initial confusion rapidly resolved with treatment of underlying pneumonia with alternative antibiotics and withholding the patient ciprofloxacin.  Mentation returning to near baseline.  Physical therapy had evaluated the patient and felt the patient would benefit from skilled physical therapy services in the home health setting.  Referral has been placed at time of discharge.  Vitamin B12 level was noted to be substantially low at 211, nearly at a deficient level.  Considering patient's known history of neuropathy and unsteady gait this was felt to possibly be contributing and therefore patient was placed on daily vitamin B12 supplementation.  Patient was instructed to follow-up with his  primary care provider for repeat vitamin B12 levels in 4 to 6 weeks.  Hospital course was complicated slightly in the morning of 5/4 due to hypoglycemia.  This rapidly corrected with oral glucose challenge.  Patient's nighttime Levemir regimen was adjusted and patient is been instructed to only take 5 to 10 units of Levemir nightly going forward.  Patient has been discharged home in improved and stable condition on 02/22/2023.

## 2023-02-21 NOTE — Assessment & Plan Note (Signed)
Continue home regimen of losartan As needed hydralazine intravenously for markedly elevated blood pressure.

## 2023-02-21 NOTE — Assessment & Plan Note (Signed)
Patient complaining of intermittent chest discomfort Complaints are vague but likely musculoskeletal or pleuritic due to ongoing pneumonia Will obtain serial troponins considering history of coronary artery disease and obtain EKG

## 2023-02-21 NOTE — TOC CM/SW Note (Signed)
Transition of Care (TOC) Screening Note  Patient Details  Name: [name] Date of Birth: 1930-01-18  Transition of Care [facility]) CM/SW Contact:    Ewing Schlein, LCSW Phone Number: 02/21/2023, 10:22 AM  Transition of Care Department [facility]) has reviewed patient and no TOC needs have been identified at this time. We will continue to monitor patient advancement through interdisciplinary progression rounds. If new patient transition needs arise, please place a TOC consult.

## 2023-02-21 NOTE — Assessment & Plan Note (Addendum)
Continue ceftriaxone and azithromycin Patient is been transitioned off of ciprofloxacin due to concerns that it may be contributing to his confusion Strep pneumoniae urine negative COVID-19 and influenza testing negative Legionella testing pending Blood cultures without growth

## 2023-02-22 DIAGNOSIS — J189 Pneumonia, unspecified organism: Secondary | ICD-10-CM | POA: Diagnosis not present

## 2023-02-22 DIAGNOSIS — G928 Other toxic encephalopathy: Secondary | ICD-10-CM | POA: Diagnosis not present

## 2023-02-22 DIAGNOSIS — R071 Chest pain on breathing: Secondary | ICD-10-CM | POA: Diagnosis not present

## 2023-02-22 DIAGNOSIS — R0989 Other specified symptoms and signs involving the circulatory and respiratory systems: Secondary | ICD-10-CM | POA: Diagnosis not present

## 2023-02-22 LAB — COMPREHENSIVE METABOLIC PANEL
ALT: 22 U/L (ref 0–44)
AST: 30 U/L (ref 15–41)
Albumin: 3.2 g/dL — ABNORMAL LOW (ref 3.5–5.0)
Alkaline Phosphatase: 81 U/L (ref 38–126)
Anion gap: 9 (ref 5–15)
BUN: 34 mg/dL — ABNORMAL HIGH (ref 8–23)
CO2: 24 mmol/L (ref 22–32)
Calcium: 8.7 mg/dL — ABNORMAL LOW (ref 8.9–10.3)
Chloride: 106 mmol/L (ref 98–111)
Creatinine, Ser: 1.22 mg/dL (ref 0.61–1.24)
GFR, Estimated: 56 mL/min — ABNORMAL LOW (ref >60)
Glucose, Bld: 79 mg/dL (ref 70–99)
Potassium: 2.8 mmol/L — ABNORMAL LOW (ref 3.5–5.1)
Sodium: 139 mmol/L (ref 135–145)
Total Bilirubin: 0.8 mg/dL (ref 0.3–1.2)
Total Protein: 6.5 g/dL (ref 6.5–8.1)

## 2023-02-22 LAB — CBC WITH DIFFERENTIAL/PLATELET
Abs Immature Granulocytes: 0.02 10*3/uL (ref 0.00–0.07)
Basophils Absolute: 0 10*3/uL (ref 0.0–0.1)
Basophils Relative: 1 %
Eosinophils Absolute: 0.4 10*3/uL (ref 0.0–0.5)
Eosinophils Relative: 5 %
HCT: 32.8 % — ABNORMAL LOW (ref 39.0–52.0)
Hemoglobin: 10.6 g/dL — ABNORMAL LOW (ref 13.0–17.0)
Immature Granulocytes: 0 %
Lymphocytes Relative: 13 %
Lymphs Abs: 1 10*3/uL (ref 0.7–4.0)
MCH: 31.1 pg (ref 26.0–34.0)
MCHC: 32.3 g/dL (ref 30.0–36.0)
MCV: 96.2 fL (ref 80.0–100.0)
Monocytes Absolute: 1 10*3/uL (ref 0.1–1.0)
Monocytes Relative: 13 %
Neutro Abs: 5.2 10*3/uL (ref 1.7–7.7)
Neutrophils Relative %: 68 %
Platelets: 190 10*3/uL (ref 150–400)
RBC: 3.41 MIL/uL — ABNORMAL LOW (ref 4.22–5.81)
RDW: 15.6 % — ABNORMAL HIGH (ref 11.5–15.5)
WBC: 7.6 10*3/uL (ref 4.0–10.5)
nRBC: 0 % (ref 0.0–0.2)

## 2023-02-22 LAB — URINE CULTURE: Culture: NO GROWTH

## 2023-02-22 LAB — GLUCOSE, CAPILLARY
Glucose-Capillary: 38 mg/dL — CL (ref 70–99)
Glucose-Capillary: 120 mg/dL — ABNORMAL HIGH (ref 70–99)
Glucose-Capillary: 54 mg/dL — ABNORMAL LOW (ref 70–99)
Glucose-Capillary: 122 mg/dL — ABNORMAL HIGH (ref 70–99)
Glucose-Capillary: 159 mg/dL — ABNORMAL HIGH (ref 70–99)

## 2023-02-22 LAB — PROCALCITONIN: Procalcitonin: <0.1 ng/mL

## 2023-02-22 LAB — BRAIN NATRIURETIC PEPTIDE: B Natriuretic Peptide: 415 pg/mL — ABNORMAL HIGH (ref 0.0–100.0)

## 2023-02-22 LAB — MAGNESIUM: Magnesium: 1.9 mg/dL (ref 1.7–2.4)

## 2023-02-22 MED ORDER — AZITHROMYCIN 500 MG PO TABS: 500.0000 mg | ORAL_TABLET | Freq: Every day | ORAL | 0 refills | Status: AC

## 2023-02-22 MED ORDER — AZITHROMYCIN 250 MG PO TABS
500.0000 mg | ORAL_TABLET | Freq: Every day | ORAL | Status: DC
  Administered 2023-02-22: 500 mg via ORAL
  Filled 2023-02-22: qty 2

## 2023-02-22 MED ORDER — CYANOCOBALAMIN 1000 MCG PO TABS: 1000.0000 ug | ORAL_TABLET | Freq: Every day | ORAL | 2 refills | Status: DC

## 2023-02-22 MED ORDER — FUROSEMIDE 20 MG PO TABS
20.0000 mg | ORAL_TABLET | Freq: Every day | ORAL | Status: DC
  Filled 2023-02-22: qty 1

## 2023-02-22 MED ORDER — FUROSEMIDE 20 MG PO TABS: 20.0000 mg | ORAL_TABLET | ORAL | 1 refills | Status: DC | PRN

## 2023-02-22 MED ORDER — INSULIN DETEMIR 100 UNIT/ML ~~LOC~~ SOLN
10.0000 [IU] | Freq: Every day | SUBCUTANEOUS | Status: DC
  Filled 2023-02-22: qty 0.1

## 2023-02-22 MED ORDER — SODIUM CHLORIDE 0.9 % IV SOLN
500.0000 mg | Freq: Every day | INTRAVENOUS | Status: DC
  Filled 2023-02-22: qty 5

## 2023-02-22 MED ORDER — POTASSIUM CHLORIDE CRYS ER 20 MEQ PO TBCR
40.0000 meq | EXTENDED_RELEASE_TABLET | ORAL | Status: AC
  Administered 2023-02-22 (×2): 40 meq via ORAL
  Filled 2023-02-22 (×2): qty 2

## 2023-02-22 MED ORDER — INSULIN DETEMIR 100 UNIT/ML ~~LOC~~ SOLN: 5.0000 [IU] | Freq: Every day | SUBCUTANEOUS | 11 refills | Status: DC

## 2023-02-22 MED ORDER — CEFDINIR 300 MG PO CAPS: 300.0000 mg | ORAL_CAPSULE | Freq: Two times a day (BID) | ORAL | 0 refills | Status: AC

## 2023-02-22 NOTE — TOC Transition Note (Signed)
Transition of Care [facility]) - CM/SW Discharge Note   Patient Details  Name: [name] MRN: [id number] Date of Birth: 1930-03-31  Transition of Care [facility]) CM/SW Contact:  Otelia Santee, LCSW Phone Number: 02/22/2023, 1:31 PM   Clinical Narrative:    Met with pt and family at bedside to discuss recommendation for [facility]. Pt/family share he has had home health services through [facility] in the past and this would be preference for HHA. Contacted Cindie w/ [facility] who is able to accept this pt for HHPT. Family share pt has a RW at home and decline further DME needs.    Final next level of care: Home w Home Health Services Barriers to Discharge: No Barriers Identified   Patient Goals and CMS Choice CMS Medicare.gov Compare Post Acute Care list provided to:: Patient Choice offered to / list presented to : Patient  Discharge Placement                         Discharge Plan and Services Additional resources added to the After Visit Summary for                  DME Arranged: N/A DME Agency: NA       HH Arranged: PT HH Agency: [facility] Date [facility] Agency Contacted: 02/22/23 Time HH Agency Contacted: 1331 Representative spoke with at [facility] Agency: Cindie  Social Determinants of Health (SDOH) Interventions SDOH Screenings   Food Insecurity: No Food Insecurity (02/21/2023)  Housing: Low Risk  (02/21/2023)  Transportation Needs: No Transportation Needs (02/21/2023)  Utilities: Not At Risk (02/21/2023)  Tobacco Use: Medium Risk (02/21/2023)     Readmission Risk Interventions    02/22/2023    1:30 PM  Readmission Risk Prevention Plan  Transportation Screening Complete  PCP or Specialist Appt within 3-5 Days Complete  HRI or Home Care Consult Complete  Social Work Consult for Recovery Care Planning/Counseling Complete  Palliative Care Screening Not Applicable  Medication Review [employer]) Complete

## 2023-02-22 NOTE — Discharge Summary (Addendum)
Physician Discharge Summary   Patient: [name] MRN: [id number] DOB: 08/02/1930  Admit date:     02/20/2023  Discharge date: 02/22/23  Discharge Physician: Marinda Elk   PCP: Sheliah Hatch, PA-C   Recommendations at discharge:   Please take all medications exactly as instructed.  This includes taking the remaining course of your oral antibiotic therapy including cefdinir and azithromycin.  Please ensure that you stop taking the ciprofloxacin previously prescribed to you. You have been noted to have a very low vitamin B12 level during this hospitalization.  Low vitamin B12 levels are known to cause neuropathy which you have and unsteady gait which you also have.  I have prescribed you daily vitamin B12 therapy.  Please follow-up with your primary care provider concerning getting a follow-up vitamin B12 level in 4 to 6 weeks. Please return to the emergency department if you develop worsening shortness of breath, confusion, fevers of greater than 100.4 F or inability to tolerate oral intake. You have been prescribed an as needed regimen of Lasix.  Weigh yourself daily, please only take this if your weight suddenly increases by more than 2 pounds compared to the day prior or 5 pounds over the last week. Please up your primary care provider in 1 to 2 weeks. A referral has been made for you to receive physical therapy services in the home setting.  The health agency should be contacting you to establish care within the next 48 hours.  Discharge Diagnoses: Principal Problem:   Pneumonia of right upper lobe due to infectious organism Active Problems:   Toxic metabolic encephalopathy   Pulmonary vascular congestion   Chest pain   AF (paroxysmal atrial fibrillation) (HCC)   Coronary artery disease involving native coronary artery of native heart   Uncontrolled type 2 diabetes mellitus with hyperglycemia, with long-term current use of insulin (HCC)   Essential hypertension  Resolved  Problems:   * No resolved hospital problems. *   Hospital Course: 89 year old male with past medical history of peripheral vascular disease status post right carotid endarterectomy, coronary artery disease medically managed (CT coronaries 07/2021), atrial fibrillation, basal cell carcinoma and melanoma diabetes mellitus type 2, hypertension, gastroesophageal reflux disease, hyperlipidemia, benign prostatic hyperplasia with urinary obstruction who presented to [facility] emergency department with his wife due to aggressiveness and confusion after recently having been diagnosed with pneumonia on 5/1 by his PCP.  Upon evaluation in the emergency department chest x-Estil revealed a right upper lobe infiltrate concerning for persisting right upper lobe pneumonia.  The hospitalist group was then called to assess the patient for admission the hospital.  Patient's encephalopathy was felt to be secondary to combination of underlying infection as well as initial treatment of his pneumonia with ciprofloxacin.  Ciprofloxacin was discontinued and patient was instead treated with ceftriaxone and azithromycin for his pneumonia.  There is also some question of pulmonary edema on initial chest x-Krzysztof which the patient received additional doses of Lasix.  This had since been ruled out.   Throughout the hospitalization patient's initial confusion rapidly resolved with treatment of underlying pneumonia with alternative antibiotics and withholding the patient ciprofloxacin.  Mentation returning to near baseline.  Physical therapy had evaluated the patient and felt the patient would benefit from skilled physical therapy services in the home health setting.  Referral has been placed at time of discharge.  Vitamin B12 level was noted to be substantially low at 211, nearly at a deficient level.  Considering patient's known history  of neuropathy and unsteady gait this was felt to possibly be contributing and therefore  patient was placed on daily vitamin B12 supplementation.  Patient was instructed to follow-up with his primary care provider for repeat vitamin B12 levels in 4 to 6 weeks.  Hospital course was complicated slightly in the morning of 5/4 due to hypoglycemia.  This rapidly corrected with oral glucose challenge.  Patient's nighttime Levemir regimen was adjusted and patient is been instructed to only take 5 to 10 units of Levemir nightly going forward.  Patient has been discharged home in improved and stable condition on 02/22/2023.       Consultants: None Procedures performed: None  Disposition: Home health Diet recommendation:  Cardiac and Carb modified diet  DISCHARGE MEDICATION: Allergies as of 02/22/2023       Reactions   Ciprofloxacin Other (See Comments)   Confusion        Medication List     STOP taking these medications    ciprofloxacin 500 MG tablet Commonly known as: CIPRO   insulin glargine 100 UNIT/ML injection Commonly known as: LANTUS   pioglitazone 45 MG tablet Commonly known as: ACTOS       TAKE these medications    acetaminophen 500 MG tablet Commonly known as: TYLENOL Take 500 mg by mouth every 6 (six) hours as needed for moderate pain (for pain).   aspirin EC 81 MG tablet Take 1 tablet (81 mg total) by mouth daily.   atorvastatin 80 MG tablet Commonly known as: LIPITOR Take 80 mg by mouth every evening.   azithromycin 500 MG tablet Commonly known as: Zithromax Take 1 tablet (500 mg total) by mouth daily for 3 days. Take your first dose the evening of 5/4.   cefdinir 300 MG capsule Commonly known as: OMNICEF Take 1 capsule (300 mg total) by mouth 2 (two) times daily for 4 days. Take the first dose the morning of 5/5. Start taking on: Feb 23, 2023   cyanocobalamin 1000 MCG tablet Take 1 tablet (1,000 mcg total) by mouth daily. Start taking on: Feb 23, 2023   cycloSPORINE 0.05 % ophthalmic emulsion Commonly known as: RESTASIS Place 1 drop  into both eyes 2 (two) times daily.   diltiazem 180 MG 24 hr tablet Commonly known as: CARDIZEM LA Take 180 mg by mouth daily.   empagliflozin 25 MG Tabs tablet Commonly known as: JARDIANCE Take 25 mg by mouth daily.   furosemide 20 MG tablet Commonly known as: Lasix Take 1 tablet (20 mg total) by mouth as needed (Weigh yourself daily.  If weight increases by more than 2 pounds from day before or 5 pounds compared to prior week take one dose.).   gabapentin 800 MG tablet Commonly known as: NEURONTIN Take 800 mg by mouth 3 (three) times daily.   GLUCOSE PO Take 1 tablet by mouth daily as needed (Low blood sugar).   insulin detemir 100 UNIT/ML injection Commonly known as: LEVEMIR Inject 0.05-0.1 mLs (5-10 Units total) into the skin at bedtime. What changed: how much to take   isosorbide mononitrate 30 MG 24 hr tablet Commonly known as: IMDUR TAKE 1 TAB DAILY. PLEASE CONTACT OUR OFFICE TO SCHEDULE AN OVERDUE VISIT FOR FUTURE REFILLS. What changed: See the new instructions.   lidocaine 5 % Commonly known as: LIDODERM Place 1 patch onto the skin daily as needed (for pain- on 12 hours/off 12 hours).   losartan 25 MG tablet Commonly known as: COZAAR Take 1 tablet (25 mg total) by mouth  daily before breakfast.   mupirocin cream 2 % Commonly known as: BACTROBAN Apply 1 Application topically 2 (two) times daily. What changed:  when to take this reasons to take this   nitroGLYCERIN 0.3 MG SL tablet Commonly known as: NITROSTAT Place 0.3 mg under the tongue every 5 (five) minutes as needed for chest pain.   Omega-3 1000 MG Caps Take 2,000 mg by mouth daily.   omeprazole 20 MG capsule Commonly known as: PRILOSEC Take 20 mg by mouth daily.   traZODone 100 MG tablet Commonly known as: DESYREL Take 100 mg by mouth at bedtime.        Follow-up Information     Sheliah Hatch, PA-C Follow up in 1 week(s).   Specialty: Family Medicine Contact information: [address] [place] [address] [phone]                 Discharge Exam: Ceasar Mons Weights   02/20/23 1017 02/20/23 2303  Weight: 88 kg 77.5 kg    Constitutional: Awake alert and oriented x3, no associated distress.   Respiratory: Scattered rhonchi bilaterally.  Normal respiratory effort. No accessory muscle use.  Cardiovascular: Regular rate and rhythm, no murmurs / rubs / gallops. No extremity edema. 2+ pedal pulses. No carotid bruits.  Abdomen: Abdomen is soft and nontender.  No evidence of intra-abdominal masses.  Positive bowel sounds noted in all quadrants.   Musculoskeletal: No joint deformity upper and lower extremities. Good ROM, no contractures. Normal muscle tone.     Condition at discharge: fair  The results of significant diagnostics from this hospitalization (including imaging, microbiology, ancillary and laboratory) are listed below for reference.   Imaging Studies: CT HEAD WO CONTRAST ( )  Result Date: 02/20/2023 CLINICAL DATA:  Delirium. EXAM: CT HEAD WITHOUT CONTRAST TECHNIQUE: Contiguous axial images were obtained from the base of the skull through the vertex without intravenous contrast. RADIATION DOSE REDUCTION: This exam was performed according to the departmental dose-optimization program which includes automated exposure control, adjustment of the mA and/or kV according to patient size and/or use of iterative reconstruction technique. COMPARISON:  Head CT 12/15/2022 FINDINGS: Brain: There is no evidence of an acute infarct, intracranial hemorrhage, mass, midline shift, or extra-axial fluid collection. Mild cerebral atrophy and minimal cerebral white matter hypodensities are considered to be within normal limits for age. Vascular: Calcified atherosclerosis at the skull base. No hyperdense vessel. Skull: No fracture or suspicious osseous lesion. Sinuses/Orbits: Mild mucosal thickening in the paranasal sinuses. Clear mastoid air cells. Unremarkable orbits. Other:  None. IMPRESSION: No evidence of acute intracranial abnormality. Electronically Signed   By: Sebastian Ache M.D.   On: 02/20/2023 13:30   DG Chest 2 View  Result Date: 02/20/2023 CLINICAL DATA:  Cough EXAM: CHEST - 2 VIEW COMPARISON:  X-Siris 01/20/2022 FINDINGS: Stable cardiopericardial silhouette with calcified aorta. Tiny pleural effusions are new. Vascular congestion seen with trace edema. More focal opacity along the inferior aspect of the right upper lobe as well. Secondary infiltrates possible. Degenerative changes of the spine on lateral view. Stable mild midthoracic spine compression deformities. Overlapping cardiac leads IMPRESSION: Developing vascular congestion, trace edema and small effusions. Additional focal opacity along the inferior aspect of the right upper lobe. An acute infiltrate is possible. Recommend follow-up. Electronically Signed   By: Karen Kays M.D.   On: 02/20/2023 11:19    Microbiology: Results for orders placed or performed during the hospital encounter of 02/20/23  Blood culture (routine x 2)  Status: None (Preliminary result)   Collection Time: 02/20/23 11:20 AM   Specimen: Right Antecubital; Blood  Result Value Ref Range Status   Specimen Description   Final    RIGHT ANTECUBITAL BLOOD Performed at [facility], [address], [place], [place] [address]    Special Requests   Final    BOTTLES DRAWN AEROBIC AND ANAEROBIC Blood Culture results may not be optimal due to an excessive volume of blood received in culture bottles Performed at [facility], [address], [place], [place] [address]    Culture   Final    NO GROWTH 2 DAYS Performed at [facility], [address], [place], [place] [address]    Report Status PENDING  Incomplete  Blood culture (routine x 2)     Status: None (Preliminary result)   Collection Time: 02/20/23 11:20 AM   Specimen: BLOOD LEFT FOREARM  Result Value Ref Range Status   Specimen Description    Final    BLOOD LEFT FOREARM Performed at [facility], [address], [place], [place] [address]    Special Requests   Final    BOTTLES DRAWN AEROBIC AND ANAEROBIC Blood Culture adequate volume Performed at [facility], [address] [place], [place] [address]    Culture   Final    NO GROWTH 2 DAYS Performed at [facility], [address] [place], [place] [address]    Report Status PENDING  Incomplete  Resp panel by RT-PCR (RSV, Flu A&B, Covid) Anterior Nasal Swab     Status: None   Collection Time: 02/20/23  3:55 PM   Specimen: Anterior Nasal Swab  Result Value Ref Range Status   SARS Coronavirus 2 by RT PCR NEGATIVE NEGATIVE Final    Comment: (NOTE) SARS-CoV-2 target nucleic acids are NOT DETECTED.  The SARS-CoV-2 RNA is generally detectable in upper respiratory specimens during the acute phase of infection. The lowest concentration of SARS-CoV-2 viral copies this assay can detect is 138 copies/mL. A negative result does not preclude SARS-Cov-2 infection and should not be used as the sole basis for treatment or other patient management decisions. A negative result may occur with  improper specimen collection/handling, submission of specimen other than nasopharyngeal swab, presence of viral mutation(s) within the areas targeted by this assay, and inadequate number of viral copies(<138 copies/mL). A negative result must be combined with clinical observations, patient history, and epidemiological information. The expected result is Negative.  Fact Sheet for Patients:  BloggerCourse.com  Fact Sheet for Healthcare Providers:  SeriousBroker.it  This test is no t yet approved or cleared by the [place] FDA and  has been authorized for detection and/or diagnosis of SARS-CoV-2 by FDA under an Emergency Use Authorization (EUA). This EUA will remain  in effect (meaning this test  can be used) for the duration of the COVID-19 declaration under Section 564(b)(1) of the Act, 21 U.S.C.section 360bbb-3(b)(1), unless the authorization is terminated  or revoked sooner.       Influenza A by PCR NEGATIVE NEGATIVE Final   Influenza B by PCR NEGATIVE NEGATIVE Final    Comment: (NOTE) The Xpert Xpress SARS-CoV-2/FLU/RSV plus assay is intended as an aid in the diagnosis of influenza from Nasopharyngeal swab specimens and should not be used as a sole basis for treatment. Nasal washings and aspirates are unacceptable for Xpert Xpress SARS-CoV-2/FLU/RSV testing.  Fact Sheet for Patients: BloggerCourse.com  Fact Sheet for Healthcare Providers: SeriousBroker.it  This test is not yet approved or cleared by the [place] and has been authorized for detection and/or diagnosis of SARS-CoV-2 by FDA under an Emergency Use Authorization (EUA). This EUA will remain in effect (meaning this test can be used) for the duration of the COVID-19 declaration under Section 564(b)(1) of the Act, 21 U.S.C. section 360bbb-3(b)(1), unless the authorization is terminated or revoked.     Resp Syncytial Virus by PCR NEGATIVE NEGATIVE Final    Comment: (NOTE) Fact Sheet for Patients: BloggerCourse.com  Fact Sheet for Healthcare Providers: SeriousBroker.it  This test is not yet approved or cleared by the [place] FDA and has been authorized for detection and/or diagnosis of SARS-CoV-2 by FDA under an Emergency Use Authorization (EUA). This EUA will remain in effect (meaning this test can be used) for the duration of the COVID-19 declaration under Section 564(b)(1) of the Act, 21 U.S.C. section 360bbb-3(b)(1), unless the authorization is terminated or revoked.  Performed at [facility], [address] [place], [place] [address]   Culture, blood (routine x  2) Call MD if unable to obtain prior to antibiotics being given     Status: None (Preliminary result)   Collection Time: 02/20/23  8:53 PM   Specimen: BLOOD LEFT FOREARM  Result Value Ref Range Status   Specimen Description   Final    BLOOD LEFT FOREARM Performed at [facility], [address] [place], [place] [address]    Special Requests   Final    BOTTLES DRAWN AEROBIC AND ANAEROBIC Blood Culture results may not be optimal due to an excessive volume of blood received in culture bottles Performed at [facility], [address], [place], [place] [address]    Culture   Final    NO GROWTH 2 DAYS Performed at [facility], [address], [place], [place] [address]    Report Status PENDING  Incomplete  Urine Culture (for pregnant, neutropenic or urologic patients or patients with an indwelling urinary catheter)     Status: None   Collection Time: 02/21/23 12:59 PM   Specimen: Urine, Clean Catch  Result Value Ref Range Status   Specimen Description   Final    URINE, CLEAN CATCH Performed at [facility], [address], [place], [place] [address]    Special Requests   Final    NONE Performed at [facility], [address], [place], [place] [address]    Culture   Final    NO GROWTH Performed at [facility], [address], [place], [place] [address]    Report Status 02/22/2023 FINAL  Final    Labs: CBC: Recent Labs  Lab 02/20/23 1119 02/20/23 1620 02/21/23 0335 02/22/23 0355  WBC 6.6 5.6 6.8 7.6  NEUTROABS 5.2  --   --  5.2  HGB 12.0* 11.1* 11.4* 10.6*  HCT 37.1* 33.9* 35.6* 32.8*  MCV 96.9 96.3 98.6 96.2  PLT 188 195 172 190   Basic Metabolic Panel: Recent Labs  Lab 02/20/23 1119 02/20/23 1620 02/21/23 0335 02/22/23 0355  NA 138  --  141 139  K 3.6  --  3.5 2.8*  CL 103  --  106 106  CO2 24  --  23 24  GLUCOSE 129*  --  73 79  BUN 25*  --  29* 34*  CREATININE 1.17 1.14 0.97  1.22  CALCIUM 9.5  --  9.2 8.7*  MG  --   --   --  1.9   Liver Function Tests: Recent Labs  Lab 02/20/23 1119 02/22/23 0355  AST 20 30  ALT 15 22  ALKPHOS 84 81  BILITOT 1.3* 0.8  PROT 7.5 6.5  ALBUMIN 3.7 3.2*   CBG: Recent Labs  Lab 02/22/23 0820 02/22/23 0848 02/22/23 0929 02/22/23 1238 02/22/23 1538  GLUCAP 38* 54* 120* 122* 159*    Discharge time spent: greater than 30 minutes.  Signed: Marinda Elk, MD [facility] 02/22/2023

## 2023-02-22 NOTE — Progress Notes (Signed)
Hypoglycemic Event  CBG: 38  Treatment: 4 oz juice/soda  Symptoms: None  Follow-up CBG: Time:9:00 CBG Result:TBD  Possible Reasons for Event: Unknown  Comments/MD notified:yes; patient up in chair; alert; eating breakfast     Nani Gasser

## 2023-02-22 NOTE — Progress Notes (Signed)
Discharge Instructions provided and explained to patient and family in detail; patient and family verbalize understanding; patient being assisted to wheelchair for transport to lobby; discharging home with family members.

## 2023-02-22 NOTE — Evaluation (Signed)
Physical Therapy Evaluation Patient Details Name: [name] MRN: [id number] DOB: 1930-08-25 Today's Date: 02/22/2023  History of Present Illness  Pt admitted from home 2* confusion and with dx of PNA, toxic metabolic encephalopathy, and pulmonary vascular congestion  Clinical Impression  Pt admitted as above and presenting with functional mobility limitations 2* generalized weakness, decreased endurance and balance deficit.  This date, pt up to ambulate limited distance in hall with RW with min guard for stability and one episode of balance loss.  Pt hopeful for dc home with family assist (but limited to elderly wife and intermittent assist of dtr) and would benefit from follow up HHPT to further address deficits and maximize IND and safety.     Recommendations for follow up therapy are one component of a multi-disciplinary discharge planning process, led by the attending physician.  Recommendations may be updated based on patient status, additional functional criteria and insurance authorization.  Follow Up Recommendations       Assistance Recommended at Discharge Intermittent Supervision/Assistance  Patient can return home with the following  A little help with walking and/or transfers;A little help with bathing/dressing/bathroom;Assistance with cooking/housework;Assist for transportation;Help with stairs or ramp for entrance    Equipment Recommendations None recommended by PT  Recommendations for Other Services       Functional Status Assessment Patient has had a recent decline in their functional status and demonstrates the ability to make significant improvements in function in a reasonable and predictable amount of time.     Precautions / Restrictions Precautions Precautions: Fall Restrictions Weight Bearing Restrictions: No      Mobility  Bed Mobility Overal bed mobility: Needs Assistance Bed Mobility: Sit to Supine       Sit to supine: Min guard, Supervision    General bed mobility comments: for safety only    Transfers Overall transfer level: Needs assistance Equipment used: Rolling walker (2 wheels) Transfers: Sit to/from Stand, Bed to chair/wheelchair/BSC Sit to Stand: Min assist   Step pivot transfers: Min assist       General transfer comment: cues for scoot forward and assist to bring wt up and fwd and to balance in standing.  One episode of balance loss bkwd in standing requiring assist to correct; step pvt recliner to bed with RW    Ambulation/Gait Ambulation/Gait assistance: Min assist Gait Distance (Feet): 140 Feet Assistive device: Rolling walker (2 wheels) Gait Pattern/deviations: Step-to pattern, Step-through pattern, Decreased step length - right, Shuffle, Trunk flexed       General Gait Details: min cues for posture and position from RW; distance limited by fatigue; steady assist for balance  Stairs            Wheelchair Mobility    Modified Rankin (Stroke Patients Only)       Balance Overall balance assessment: Needs assistance Sitting-balance support: Feet supported, No upper extremity supported Sitting balance-Leahy Scale: Good     Standing balance support: Bilateral upper extremity supported Standing balance-Leahy Scale: Poor                               Pertinent Vitals/Pain Pain Assessment Pain Assessment: No/denies pain    Home Living Family/patient expects to be discharged to:: Private residence Living Arrangements: Spouse/significant other Available Help at Discharge: Family;Available PRN/intermittently Type of Home: House Home Access: Ramped entrance       Home Layout: One level Home Equipment: Agricultural consultant (2 wheels);Cane - single point;Shower seat -  built in;Grab bars - tub/shower;Hand held shower head Additional Comments: Wife has walk in shower with seat that pt can use    Prior Function Prior Level of Function : Independent/Modified Independent              Mobility Comments: ambulates with RW ADLs Comments: IND with ADL     Hand Dominance   Dominant Hand: Right    Extremity/Trunk Assessment   Upper Extremity Assessment Upper Extremity Assessment: Generalized weakness    Lower Extremity Assessment Lower Extremity Assessment: Generalized weakness    Cervical / Trunk Assessment Cervical / Trunk Assessment: Kyphotic  Communication   Communication: HOH  Cognition Arousal/Alertness: Awake/alert Behavior During Therapy: WFL for tasks assessed/performed Overall Cognitive Status: Within Functional Limits for tasks assessed                                 General Comments: Hearing deficits requiring frequent repetition of cues and questions        General Comments      Exercises     Assessment/Plan    PT Assessment Patient needs continued PT services  PT Problem List Decreased strength;Decreased activity tolerance;Decreased balance;Decreased mobility;Decreased knowledge of use of DME       PT Treatment Interventions DME instruction;Gait training;Functional mobility training;Therapeutic activities;Therapeutic exercise;Balance training;Patient/family education    PT Goals (Current goals can be found in the Care Plan section)  Acute Rehab PT Goals Patient Stated Goal: HOME PT Goal Formulation: With patient Time For Goal Achievement: 03/01/23 Potential to Achieve Goals: Good    Frequency Min 5X/week     Co-evaluation               AM-PAC PT "6 Clicks" Mobility  Outcome Measure Help needed turning from your back to your side while in a flat bed without using bedrails?: None Help needed moving from lying on your back to sitting on the side of a flat bed without using bedrails?: A Little Help needed moving to and from a bed to a chair (including a wheelchair)?: A Little Help needed standing up from a chair using your arms (e.g., wheelchair or bedside chair)?: A Little Help needed to walk in  hospital room?: A Little Help needed climbing 3-5 steps with a railing? : A Little 6 Click Score: 19    End of Session Equipment Utilized During Treatment: Gait belt Activity Tolerance: Patient tolerated treatment well Patient left: in bed Nurse Communication: Mobility status PT Visit Diagnosis: Unsteadiness on feet (R26.81);Muscle weakness (generalized) (M62.81);Difficulty in walking, not elsewhere classified (R26.2)    Time: 5366-4403 PT Time Calculation (min) (ACUTE ONLY): 19 min   Charges:   PT Evaluation $PT Eval Low Complexity: 1 Low          Mauro Kaufmann PT Acute Rehabilitation Services Pager [phone] Office [phone]   Nyshaun Standage 02/22/2023, 12:58 PM

## 2023-02-22 NOTE — Progress Notes (Signed)
Hypoglycemic Event  CBG: 52 on recheck   Treatment: 8 oz juice/soda  Symptoms: None  Follow-up CBG: Time:9:30 CBG Result:TBD   Possible Reasons for Event:   Comments/MD notified:MD aware; Patient is up in chair, talking and coherent; alert and oriented x 4; given an additional 8 oz of orange juice     [name] [name]

## 2023-02-22 NOTE — Discharge Instructions (Addendum)
Please take all medications exactly as instructed.  This includes taking the remaining course of your oral antibiotic therapy including cefdinir and azithromycin.  Please ensure that you stop taking the ciprofloxacin previously prescribed to you. You have been noted to have a very low vitamin B12 level during this hospitalization.  Low vitamin B12 levels are known to cause neuropathy which you have and unsteady gait which you also have.  I have prescribed you daily vitamin B12 therapy.  Please follow-up with your primary care provider concerning getting a follow-up vitamin B12 level in 4 to 6 weeks. Please return to the emergency department if you develop worsening shortness of breath, confusion, fevers of greater than 100.4 F or inability to tolerate oral intake. You have been prescribed an as needed regimen of Lasix.  Weigh yourself daily, please only take this if your weight suddenly increases by more than 2 pounds compared to the day prior or 5 pounds over the last week. Please up your primary care provider in 1 to 2 weeks. A referral has been made for you to receive physical therapy services in the home setting.  The health agency should be contacting you to establish care within the next 48 hours.

## 2023-02-22 NOTE — Progress Notes (Signed)
Physical Therapy Treatment Patient Details Name: [name] MRN: [id number] DOB: 01-Feb-1930 Today's Date: 02/22/2023   History of Present Illness Pt admitted from home 2* confusion and with dx of PNA, toxic metabolic encephalopathy, and pulmonary vascular congestion    PT Comments    Spouse and Daughter present during session.  Applied B SHOES. Assisted OOB to amb to bathroom then in hallway went well.  Static standing at toilet to void.  No true LOB.  Caution with back steps and turning to sink.  Assisted with amb in hallway.  General Gait Details: with SHOES ON, Pt amb a functional distance with walker.  Slight tendancy to push walker too far front.  Slight shuffled steps.  VC's for safety with turns.  Avg RA 94% and HR 82.  No dyspea.  Mild coughing.  "feels good" stated pt.  Positioned in recliner. Pt plans to return home with family support.   Recommendations for follow up therapy are one component of a multi-disciplinary discharge planning process, led by the attending physician.  Recommendations may be updated based on patient status, additional functional criteria and insurance authorization.  Follow Up Recommendations       Assistance Recommended at Discharge Intermittent Supervision/Assistance  Patient can return home with the following A little help with walking and/or transfers;A little help with bathing/dressing/bathroom;Assistance with cooking/housework;Assist for transportation;Help with stairs or ramp for entrance   Equipment Recommendations  None recommended by PT    Recommendations for Other Services       Precautions / Restrictions Precautions Precautions: Fall Precaution Comments: does better with SHOES ON Restrictions Weight Bearing Restrictions: No     Mobility  Bed Mobility Overal bed mobility: Needs Assistance Bed Mobility: Supine to Sit     Supine to sit: Min assist     General bed mobility comments: Min Asisst for upper beody as well as use of bed  pad to complete scooting.    Transfers Overall transfer level: Needs assistance Equipment used: Rolling walker (2 wheels) Transfers: Sit to/from Stand Sit to Stand: Min guard, Min assist           General transfer comment: elevated bed and initial posterior lean.  VC's to correct.  Pt tends to "pull up" from walker vs "push up" from bed.  Securing walker.  Initial unsteady.    Ambulation/Gait Ambulation/Gait assistance: Min guard, Min assist Gait Distance (Feet): 145 Feet   Gait Pattern/deviations: Step-to pattern, Step-through pattern, Decreased step length - right, Shuffle, Trunk flexed Gait velocity: decreased     General Gait Details: with SHOES ON, Pt amb a functional distance with walker.  Slight tendancy to push walker too far front.  Slight shuffled steps.  VC's for safety with turns.  Avg RA 94% and HR 82.  No dyspea.  Mild coughing.  "feels good" stated pt.   Stairs             Wheelchair Mobility    Modified Rankin (Stroke Patients Only)       Balance                                            Cognition Arousal/Alertness: Awake/alert Behavior During Therapy: WFL for tasks assessed/performed Overall Cognitive Status: Within Functional Limits for tasks assessed  General Comments: Hearing deficits requiring frequent repetition of cues and questions.  Married "68 years" stated pt.  Pleasant.  Following all commands.        Exercises      General Comments        Pertinent Vitals/Pain Pain Assessment Pain Assessment: Faces Faces Pain Scale: Hurts a little bit Pain Location: general, Neuropathy Pain Descriptors / Indicators: Guarding Pain Intervention(s): Monitored during session    Home Living                          Prior Function            PT Goals (current goals can now be found in the care plan section) Progress towards PT goals: Progressing toward  goals    Frequency    Min 5X/week      PT Plan Current plan remains appropriate    Co-evaluation              AM-PAC PT "6 Clicks" Mobility   Outcome Measure  Help needed turning from your back to your side while in a flat bed without using bedrails?: A Little Help needed moving from lying on your back to sitting on the side of a flat bed without using bedrails?: A Little Help needed moving to and from a bed to a chair (including a wheelchair)?: A Little Help needed standing up from a chair using your arms (e.g., wheelchair or bedside chair)?: A Little Help needed to walk in hospital room?: A Little Help needed climbing 3-5 steps with a railing? : A Little 6 Click Score: 18    End of Session Equipment Utilized During Treatment: Gait belt Activity Tolerance: Patient tolerated treatment well Patient left: in chair;with call bell/phone within reach;with chair alarm set;with family/visitor present Nurse Communication: Mobility status PT Visit Diagnosis: Unsteadiness on feet (R26.81);Muscle weakness (generalized) (M62.81);Difficulty in walking, not elsewhere classified (R26.2)     Time: 9604-5409 PT Time Calculation (min) (ACUTE ONLY): 15 min  Charges:  $Gait Training: 8-22 mins                     Felecia Shelling  PTA Acute  Rehabilitation Services Office M-F          [phone]

## 2023-02-23 LAB — CULTURE, BLOOD (ROUTINE X 2)

## 2023-02-24 DIAGNOSIS — Z993 Dependence on wheelchair: Secondary | ICD-10-CM | POA: Diagnosis not present

## 2023-02-24 DIAGNOSIS — E119 Type 2 diabetes mellitus without complications: Secondary | ICD-10-CM | POA: Diagnosis not present

## 2023-02-24 DIAGNOSIS — I10 Essential (primary) hypertension: Secondary | ICD-10-CM | POA: Diagnosis not present

## 2023-02-24 DIAGNOSIS — J189 Pneumonia, unspecified organism: Secondary | ICD-10-CM | POA: Diagnosis not present

## 2023-02-24 LAB — LEGIONELLA PNEUMOPHILA SEROGP 1 UR AG: L. pneumophila Serogp 1 Ur Ag: NEGATIVE

## 2023-02-25 LAB — CULTURE, BLOOD (ROUTINE X 2)
Culture: NO GROWTH
Culture: NO GROWTH
Special Requests: ADEQUATE

## 2023-02-28 DIAGNOSIS — D500 Iron deficiency anemia secondary to blood loss (chronic): Secondary | ICD-10-CM | POA: Diagnosis not present

## 2023-02-28 DIAGNOSIS — I2510 Atherosclerotic heart disease of native coronary artery without angina pectoris: Secondary | ICD-10-CM | POA: Diagnosis not present

## 2023-02-28 DIAGNOSIS — I480 Paroxysmal atrial fibrillation: Secondary | ICD-10-CM | POA: Diagnosis not present

## 2023-02-28 DIAGNOSIS — J181 Lobar pneumonia, unspecified organism: Secondary | ICD-10-CM | POA: Diagnosis not present

## 2023-02-28 DIAGNOSIS — E1142 Type 2 diabetes mellitus with diabetic polyneuropathy: Secondary | ICD-10-CM | POA: Diagnosis not present

## 2023-02-28 DIAGNOSIS — G928 Other toxic encephalopathy: Secondary | ICD-10-CM | POA: Diagnosis not present

## 2023-02-28 DIAGNOSIS — I080 Rheumatic disorders of both mitral and aortic valves: Secondary | ICD-10-CM | POA: Diagnosis not present

## 2023-02-28 DIAGNOSIS — I4892 Unspecified atrial flutter: Secondary | ICD-10-CM | POA: Diagnosis not present

## 2023-02-28 DIAGNOSIS — I110 Hypertensive heart disease with heart failure: Secondary | ICD-10-CM | POA: Diagnosis not present

## 2023-03-03 DIAGNOSIS — I2510 Atherosclerotic heart disease of native coronary artery without angina pectoris: Secondary | ICD-10-CM | POA: Diagnosis not present

## 2023-03-03 DIAGNOSIS — D500 Iron deficiency anemia secondary to blood loss (chronic): Secondary | ICD-10-CM | POA: Diagnosis not present

## 2023-03-03 DIAGNOSIS — I480 Paroxysmal atrial fibrillation: Secondary | ICD-10-CM | POA: Diagnosis not present

## 2023-03-03 DIAGNOSIS — J181 Lobar pneumonia, unspecified organism: Secondary | ICD-10-CM | POA: Diagnosis not present

## 2023-03-03 DIAGNOSIS — G928 Other toxic encephalopathy: Secondary | ICD-10-CM | POA: Diagnosis not present

## 2023-03-03 DIAGNOSIS — I110 Hypertensive heart disease with heart failure: Secondary | ICD-10-CM | POA: Diagnosis not present

## 2023-03-03 DIAGNOSIS — I080 Rheumatic disorders of both mitral and aortic valves: Secondary | ICD-10-CM | POA: Diagnosis not present

## 2023-03-03 DIAGNOSIS — I4892 Unspecified atrial flutter: Secondary | ICD-10-CM | POA: Diagnosis not present

## 2023-03-03 DIAGNOSIS — E1142 Type 2 diabetes mellitus with diabetic polyneuropathy: Secondary | ICD-10-CM | POA: Diagnosis not present

## 2023-03-05 DIAGNOSIS — I110 Hypertensive heart disease with heart failure: Secondary | ICD-10-CM | POA: Diagnosis not present

## 2023-03-05 DIAGNOSIS — E1142 Type 2 diabetes mellitus with diabetic polyneuropathy: Secondary | ICD-10-CM | POA: Diagnosis not present

## 2023-03-05 DIAGNOSIS — I2510 Atherosclerotic heart disease of native coronary artery without angina pectoris: Secondary | ICD-10-CM | POA: Diagnosis not present

## 2023-03-05 DIAGNOSIS — I4892 Unspecified atrial flutter: Secondary | ICD-10-CM | POA: Diagnosis not present

## 2023-03-05 DIAGNOSIS — D500 Iron deficiency anemia secondary to blood loss (chronic): Secondary | ICD-10-CM | POA: Diagnosis not present

## 2023-03-05 DIAGNOSIS — I480 Paroxysmal atrial fibrillation: Secondary | ICD-10-CM | POA: Diagnosis not present

## 2023-03-05 DIAGNOSIS — G928 Other toxic encephalopathy: Secondary | ICD-10-CM | POA: Diagnosis not present

## 2023-03-05 DIAGNOSIS — I080 Rheumatic disorders of both mitral and aortic valves: Secondary | ICD-10-CM | POA: Diagnosis not present

## 2023-03-05 DIAGNOSIS — J181 Lobar pneumonia, unspecified organism: Secondary | ICD-10-CM | POA: Diagnosis not present

## 2023-03-07 ENCOUNTER — Other Ambulatory Visit: Payer: Self-pay | Admitting: Cardiovascular Disease

## 2023-03-10 DIAGNOSIS — I080 Rheumatic disorders of both mitral and aortic valves: Secondary | ICD-10-CM | POA: Diagnosis not present

## 2023-03-10 DIAGNOSIS — D500 Iron deficiency anemia secondary to blood loss (chronic): Secondary | ICD-10-CM | POA: Diagnosis not present

## 2023-03-10 DIAGNOSIS — J181 Lobar pneumonia, unspecified organism: Secondary | ICD-10-CM | POA: Diagnosis not present

## 2023-03-10 DIAGNOSIS — I110 Hypertensive heart disease with heart failure: Secondary | ICD-10-CM | POA: Diagnosis not present

## 2023-03-10 DIAGNOSIS — E1142 Type 2 diabetes mellitus with diabetic polyneuropathy: Secondary | ICD-10-CM | POA: Diagnosis not present

## 2023-03-10 DIAGNOSIS — I480 Paroxysmal atrial fibrillation: Secondary | ICD-10-CM | POA: Diagnosis not present

## 2023-03-10 DIAGNOSIS — I4892 Unspecified atrial flutter: Secondary | ICD-10-CM | POA: Diagnosis not present

## 2023-03-10 DIAGNOSIS — I2510 Atherosclerotic heart disease of native coronary artery without angina pectoris: Secondary | ICD-10-CM | POA: Diagnosis not present

## 2023-03-10 DIAGNOSIS — G928 Other toxic encephalopathy: Secondary | ICD-10-CM | POA: Diagnosis not present

## 2023-03-11 DIAGNOSIS — I480 Paroxysmal atrial fibrillation: Secondary | ICD-10-CM | POA: Diagnosis not present

## 2023-03-11 DIAGNOSIS — J181 Lobar pneumonia, unspecified organism: Secondary | ICD-10-CM | POA: Diagnosis not present

## 2023-03-11 DIAGNOSIS — I080 Rheumatic disorders of both mitral and aortic valves: Secondary | ICD-10-CM | POA: Diagnosis not present

## 2023-03-11 DIAGNOSIS — E1142 Type 2 diabetes mellitus with diabetic polyneuropathy: Secondary | ICD-10-CM | POA: Diagnosis not present

## 2023-03-11 DIAGNOSIS — G928 Other toxic encephalopathy: Secondary | ICD-10-CM | POA: Diagnosis not present

## 2023-03-11 DIAGNOSIS — I2510 Atherosclerotic heart disease of native coronary artery without angina pectoris: Secondary | ICD-10-CM | POA: Diagnosis not present

## 2023-03-11 DIAGNOSIS — D500 Iron deficiency anemia secondary to blood loss (chronic): Secondary | ICD-10-CM | POA: Diagnosis not present

## 2023-03-11 DIAGNOSIS — I110 Hypertensive heart disease with heart failure: Secondary | ICD-10-CM | POA: Diagnosis not present

## 2023-03-11 DIAGNOSIS — I4892 Unspecified atrial flutter: Secondary | ICD-10-CM | POA: Diagnosis not present

## 2023-03-14 DIAGNOSIS — D500 Iron deficiency anemia secondary to blood loss (chronic): Secondary | ICD-10-CM | POA: Diagnosis not present

## 2023-03-14 DIAGNOSIS — I2510 Atherosclerotic heart disease of native coronary artery without angina pectoris: Secondary | ICD-10-CM | POA: Diagnosis not present

## 2023-03-14 DIAGNOSIS — I4892 Unspecified atrial flutter: Secondary | ICD-10-CM | POA: Diagnosis not present

## 2023-03-14 DIAGNOSIS — E1142 Type 2 diabetes mellitus with diabetic polyneuropathy: Secondary | ICD-10-CM | POA: Diagnosis not present

## 2023-03-14 DIAGNOSIS — I080 Rheumatic disorders of both mitral and aortic valves: Secondary | ICD-10-CM | POA: Diagnosis not present

## 2023-03-14 DIAGNOSIS — G928 Other toxic encephalopathy: Secondary | ICD-10-CM | POA: Diagnosis not present

## 2023-03-14 DIAGNOSIS — I480 Paroxysmal atrial fibrillation: Secondary | ICD-10-CM | POA: Diagnosis not present

## 2023-03-14 DIAGNOSIS — J181 Lobar pneumonia, unspecified organism: Secondary | ICD-10-CM | POA: Diagnosis not present

## 2023-03-14 DIAGNOSIS — I110 Hypertensive heart disease with heart failure: Secondary | ICD-10-CM | POA: Diagnosis not present

## 2023-03-17 DIAGNOSIS — I480 Paroxysmal atrial fibrillation: Secondary | ICD-10-CM | POA: Diagnosis not present

## 2023-03-17 DIAGNOSIS — J181 Lobar pneumonia, unspecified organism: Secondary | ICD-10-CM | POA: Diagnosis not present

## 2023-03-17 DIAGNOSIS — D500 Iron deficiency anemia secondary to blood loss (chronic): Secondary | ICD-10-CM | POA: Diagnosis not present

## 2023-03-17 DIAGNOSIS — I4892 Unspecified atrial flutter: Secondary | ICD-10-CM | POA: Diagnosis not present

## 2023-03-17 DIAGNOSIS — I2510 Atherosclerotic heart disease of native coronary artery without angina pectoris: Secondary | ICD-10-CM | POA: Diagnosis not present

## 2023-03-17 DIAGNOSIS — I080 Rheumatic disorders of both mitral and aortic valves: Secondary | ICD-10-CM | POA: Diagnosis not present

## 2023-03-17 DIAGNOSIS — E1142 Type 2 diabetes mellitus with diabetic polyneuropathy: Secondary | ICD-10-CM | POA: Diagnosis not present

## 2023-03-17 DIAGNOSIS — I110 Hypertensive heart disease with heart failure: Secondary | ICD-10-CM | POA: Diagnosis not present

## 2023-03-17 DIAGNOSIS — G928 Other toxic encephalopathy: Secondary | ICD-10-CM | POA: Diagnosis not present

## 2023-03-19 DIAGNOSIS — E1142 Type 2 diabetes mellitus with diabetic polyneuropathy: Secondary | ICD-10-CM | POA: Diagnosis not present

## 2023-03-19 DIAGNOSIS — J181 Lobar pneumonia, unspecified organism: Secondary | ICD-10-CM | POA: Diagnosis not present

## 2023-03-19 DIAGNOSIS — I110 Hypertensive heart disease with heart failure: Secondary | ICD-10-CM | POA: Diagnosis not present

## 2023-03-19 DIAGNOSIS — I480 Paroxysmal atrial fibrillation: Secondary | ICD-10-CM | POA: Diagnosis not present

## 2023-03-19 DIAGNOSIS — D500 Iron deficiency anemia secondary to blood loss (chronic): Secondary | ICD-10-CM | POA: Diagnosis not present

## 2023-03-19 DIAGNOSIS — I4892 Unspecified atrial flutter: Secondary | ICD-10-CM | POA: Diagnosis not present

## 2023-03-19 DIAGNOSIS — I080 Rheumatic disorders of both mitral and aortic valves: Secondary | ICD-10-CM | POA: Diagnosis not present

## 2023-03-19 DIAGNOSIS — I2510 Atherosclerotic heart disease of native coronary artery without angina pectoris: Secondary | ICD-10-CM | POA: Diagnosis not present

## 2023-03-19 DIAGNOSIS — G928 Other toxic encephalopathy: Secondary | ICD-10-CM | POA: Diagnosis not present

## 2023-03-24 ENCOUNTER — Other Ambulatory Visit: Payer: Self-pay | Admitting: Cardiovascular Disease

## 2023-03-24 DIAGNOSIS — J181 Lobar pneumonia, unspecified organism: Secondary | ICD-10-CM | POA: Diagnosis not present

## 2023-03-24 DIAGNOSIS — E1142 Type 2 diabetes mellitus with diabetic polyneuropathy: Secondary | ICD-10-CM | POA: Diagnosis not present

## 2023-03-24 DIAGNOSIS — D500 Iron deficiency anemia secondary to blood loss (chronic): Secondary | ICD-10-CM | POA: Diagnosis not present

## 2023-03-24 DIAGNOSIS — G928 Other toxic encephalopathy: Secondary | ICD-10-CM | POA: Diagnosis not present

## 2023-03-24 DIAGNOSIS — I4892 Unspecified atrial flutter: Secondary | ICD-10-CM | POA: Diagnosis not present

## 2023-03-24 DIAGNOSIS — I2510 Atherosclerotic heart disease of native coronary artery without angina pectoris: Secondary | ICD-10-CM | POA: Diagnosis not present

## 2023-03-24 DIAGNOSIS — I480 Paroxysmal atrial fibrillation: Secondary | ICD-10-CM | POA: Diagnosis not present

## 2023-03-24 DIAGNOSIS — I110 Hypertensive heart disease with heart failure: Secondary | ICD-10-CM | POA: Diagnosis not present

## 2023-03-24 DIAGNOSIS — I080 Rheumatic disorders of both mitral and aortic valves: Secondary | ICD-10-CM | POA: Diagnosis not present

## 2023-03-25 NOTE — Telephone Encounter (Signed)
Pt has not been seen since 11/2021, pt has been notified numerous times to make an overdue appt. Pt has not done so. Would Dr. Allyson Sabal like to refill this medication without an appt? Please address

## 2023-03-26 NOTE — Telephone Encounter (Signed)
Attempted to call pt. Voicemail is full, unable to leave message.

## 2023-03-28 NOTE — Telephone Encounter (Signed)
Attempted to call pt. Voicemail is full, unable to leave message.  

## 2023-03-31 DIAGNOSIS — I2510 Atherosclerotic heart disease of native coronary artery without angina pectoris: Secondary | ICD-10-CM | POA: Diagnosis not present

## 2023-03-31 DIAGNOSIS — J181 Lobar pneumonia, unspecified organism: Secondary | ICD-10-CM | POA: Diagnosis not present

## 2023-03-31 DIAGNOSIS — I4892 Unspecified atrial flutter: Secondary | ICD-10-CM | POA: Diagnosis not present

## 2023-03-31 DIAGNOSIS — I110 Hypertensive heart disease with heart failure: Secondary | ICD-10-CM | POA: Diagnosis not present

## 2023-03-31 DIAGNOSIS — D500 Iron deficiency anemia secondary to blood loss (chronic): Secondary | ICD-10-CM | POA: Diagnosis not present

## 2023-03-31 DIAGNOSIS — I080 Rheumatic disorders of both mitral and aortic valves: Secondary | ICD-10-CM | POA: Diagnosis not present

## 2023-03-31 DIAGNOSIS — G928 Other toxic encephalopathy: Secondary | ICD-10-CM | POA: Diagnosis not present

## 2023-03-31 DIAGNOSIS — E1142 Type 2 diabetes mellitus with diabetic polyneuropathy: Secondary | ICD-10-CM | POA: Diagnosis not present

## 2023-03-31 DIAGNOSIS — I480 Paroxysmal atrial fibrillation: Secondary | ICD-10-CM | POA: Diagnosis not present

## 2023-04-01 ENCOUNTER — Ambulatory Visit: Payer: Medicare PPO | Admitting: Podiatry

## 2023-04-07 DIAGNOSIS — E1142 Type 2 diabetes mellitus with diabetic polyneuropathy: Secondary | ICD-10-CM | POA: Diagnosis not present

## 2023-04-07 DIAGNOSIS — H04129 Dry eye syndrome of unspecified lacrimal gland: Secondary | ICD-10-CM | POA: Diagnosis not present

## 2023-04-07 DIAGNOSIS — I4892 Unspecified atrial flutter: Secondary | ICD-10-CM | POA: Diagnosis not present

## 2023-04-07 DIAGNOSIS — H409 Unspecified glaucoma: Secondary | ICD-10-CM | POA: Diagnosis not present

## 2023-04-07 DIAGNOSIS — G4700 Insomnia, unspecified: Secondary | ICD-10-CM | POA: Diagnosis not present

## 2023-04-07 DIAGNOSIS — Z8249 Family history of ischemic heart disease and other diseases of the circulatory system: Secondary | ICD-10-CM | POA: Diagnosis not present

## 2023-04-07 DIAGNOSIS — I700 Atherosclerosis of aorta: Secondary | ICD-10-CM | POA: Diagnosis not present

## 2023-04-07 DIAGNOSIS — I272 Pulmonary hypertension, unspecified: Secondary | ICD-10-CM | POA: Diagnosis not present

## 2023-04-07 DIAGNOSIS — E1122 Type 2 diabetes mellitus with diabetic chronic kidney disease: Secondary | ICD-10-CM | POA: Diagnosis not present

## 2023-04-07 DIAGNOSIS — I4891 Unspecified atrial fibrillation: Secondary | ICD-10-CM | POA: Diagnosis not present

## 2023-04-07 DIAGNOSIS — R32 Unspecified urinary incontinence: Secondary | ICD-10-CM | POA: Diagnosis not present

## 2023-04-07 DIAGNOSIS — I129 Hypertensive chronic kidney disease with stage 1 through stage 4 chronic kidney disease, or unspecified chronic kidney disease: Secondary | ICD-10-CM | POA: Diagnosis not present

## 2023-04-07 DIAGNOSIS — K219 Gastro-esophageal reflux disease without esophagitis: Secondary | ICD-10-CM | POA: Diagnosis not present

## 2023-04-07 DIAGNOSIS — Z794 Long term (current) use of insulin: Secondary | ICD-10-CM | POA: Diagnosis not present

## 2023-04-07 DIAGNOSIS — E785 Hyperlipidemia, unspecified: Secondary | ICD-10-CM | POA: Diagnosis not present

## 2023-04-07 DIAGNOSIS — Z87891 Personal history of nicotine dependence: Secondary | ICD-10-CM | POA: Diagnosis not present

## 2023-04-07 DIAGNOSIS — N1832 Chronic kidney disease, stage 3b: Secondary | ICD-10-CM | POA: Diagnosis not present

## 2023-04-07 DIAGNOSIS — M1990 Unspecified osteoarthritis, unspecified site: Secondary | ICD-10-CM | POA: Diagnosis not present

## 2023-04-09 DIAGNOSIS — I2510 Atherosclerotic heart disease of native coronary artery without angina pectoris: Secondary | ICD-10-CM | POA: Diagnosis not present

## 2023-04-09 DIAGNOSIS — I480 Paroxysmal atrial fibrillation: Secondary | ICD-10-CM | POA: Diagnosis not present

## 2023-04-09 DIAGNOSIS — I080 Rheumatic disorders of both mitral and aortic valves: Secondary | ICD-10-CM | POA: Diagnosis not present

## 2023-04-09 DIAGNOSIS — J181 Lobar pneumonia, unspecified organism: Secondary | ICD-10-CM | POA: Diagnosis not present

## 2023-04-09 DIAGNOSIS — D500 Iron deficiency anemia secondary to blood loss (chronic): Secondary | ICD-10-CM | POA: Diagnosis not present

## 2023-04-09 DIAGNOSIS — E1142 Type 2 diabetes mellitus with diabetic polyneuropathy: Secondary | ICD-10-CM | POA: Diagnosis not present

## 2023-04-09 DIAGNOSIS — I4892 Unspecified atrial flutter: Secondary | ICD-10-CM | POA: Diagnosis not present

## 2023-04-09 DIAGNOSIS — G928 Other toxic encephalopathy: Secondary | ICD-10-CM | POA: Diagnosis not present

## 2023-04-09 DIAGNOSIS — I110 Hypertensive heart disease with heart failure: Secondary | ICD-10-CM | POA: Diagnosis not present

## 2023-04-14 ENCOUNTER — Ambulatory Visit: Payer: Medicare PPO | Admitting: Podiatry

## 2023-04-14 DIAGNOSIS — M79675 Pain in left toe(s): Secondary | ICD-10-CM | POA: Diagnosis not present

## 2023-04-14 DIAGNOSIS — B351 Tinea unguium: Secondary | ICD-10-CM

## 2023-04-14 DIAGNOSIS — M79674 Pain in right toe(s): Secondary | ICD-10-CM | POA: Diagnosis not present

## 2023-04-14 NOTE — Progress Notes (Signed)
       Subjective:  Patient ID: [name], male    DOB: 1929-12-23,  MRN: [id number]   [name] presents to clinic today for:  Chief Complaint  Patient presents with   Nail Problem     Routine foot care  . Patient notes nails are thick, discolored, elongated and painful in shoegear when trying to ambulate.  Patient states her blood sugar was 122 mg/dL this morning.  Patient's wife and daughter in the room today.  Patient is hard of hearing.  PCP is Sheliah Hatch, PA-C.  Allergies  Allergen Reactions   Ciprofloxacin Other (See Comments)    Confusion    Review of Systems: Negative except as noted in the HPI.  Objective:  There were no vitals filed for this visit.  [name] is a pleasant 89 y.o. male in NAD. AAO x 3.  Vascular Examination: Capillary refill time is 3-5 seconds to toes bilateral. Palpable pedal pulses b/l LE. Digital hair present b/l. No pedal edema b/l. Skin temperature gradient WNL b/l. No varicosities b/l. No cyanosis or clubbing noted b/l.   Dermatological Examination: Pedal skin with normal turgor, texture and tone b/l. No open wounds. No interdigital macerations b/l. Toenails x10 are 3mm thick, discolored, dystrophic with subungual debris. There is pain with compression of the nail plates.  They are elongated x10  Neurological Examination: Protective sensation intact bilateral LE. Vibratory sensation intact bilateral LE.  Assessment/Plan: 1. Pain due to onychomycosis of toenails of both feet     The mycotic toenails were sharply debrided x10 with sterile nail nippers and a power debriding burr to decrease bulk/thickness and length.    Return in about 3 months (around 07/15/2023) for [facility] / nail trim.   Clerance Lav, DPM, FACFAS [facility]     [address] [place], [place] [address]                Office [phone]  Fax [phone]

## 2023-04-15 DIAGNOSIS — Z794 Long term (current) use of insulin: Secondary | ICD-10-CM | POA: Diagnosis not present

## 2023-04-15 DIAGNOSIS — E1142 Type 2 diabetes mellitus with diabetic polyneuropathy: Secondary | ICD-10-CM | POA: Diagnosis not present

## 2023-04-15 DIAGNOSIS — E782 Mixed hyperlipidemia: Secondary | ICD-10-CM | POA: Diagnosis not present

## 2023-04-15 DIAGNOSIS — E1122 Type 2 diabetes mellitus with diabetic chronic kidney disease: Secondary | ICD-10-CM | POA: Diagnosis not present

## 2023-04-15 DIAGNOSIS — N1831 Chronic kidney disease, stage 3a: Secondary | ICD-10-CM | POA: Diagnosis not present

## 2023-04-15 DIAGNOSIS — E1151 Type 2 diabetes mellitus with diabetic peripheral angiopathy without gangrene: Secondary | ICD-10-CM | POA: Diagnosis not present

## 2023-04-15 DIAGNOSIS — I4891 Unspecified atrial fibrillation: Secondary | ICD-10-CM | POA: Diagnosis not present

## 2023-04-15 DIAGNOSIS — Z9989 Dependence on other enabling machines and devices: Secondary | ICD-10-CM | POA: Diagnosis not present

## 2023-04-15 DIAGNOSIS — I10 Essential (primary) hypertension: Secondary | ICD-10-CM | POA: Diagnosis not present

## 2023-04-16 DIAGNOSIS — G928 Other toxic encephalopathy: Secondary | ICD-10-CM | POA: Diagnosis not present

## 2023-04-16 DIAGNOSIS — E1142 Type 2 diabetes mellitus with diabetic polyneuropathy: Secondary | ICD-10-CM | POA: Diagnosis not present

## 2023-04-16 DIAGNOSIS — I110 Hypertensive heart disease with heart failure: Secondary | ICD-10-CM | POA: Diagnosis not present

## 2023-04-16 DIAGNOSIS — I080 Rheumatic disorders of both mitral and aortic valves: Secondary | ICD-10-CM | POA: Diagnosis not present

## 2023-04-16 DIAGNOSIS — Z961 Presence of intraocular lens: Secondary | ICD-10-CM | POA: Diagnosis not present

## 2023-04-16 DIAGNOSIS — H5212 Myopia, left eye: Secondary | ICD-10-CM | POA: Diagnosis not present

## 2023-04-16 DIAGNOSIS — H40013 Open angle with borderline findings, low risk, bilateral: Secondary | ICD-10-CM | POA: Diagnosis not present

## 2023-04-16 DIAGNOSIS — I4892 Unspecified atrial flutter: Secondary | ICD-10-CM | POA: Diagnosis not present

## 2023-04-16 DIAGNOSIS — I480 Paroxysmal atrial fibrillation: Secondary | ICD-10-CM | POA: Diagnosis not present

## 2023-04-16 DIAGNOSIS — I2510 Atherosclerotic heart disease of native coronary artery without angina pectoris: Secondary | ICD-10-CM | POA: Diagnosis not present

## 2023-04-16 DIAGNOSIS — H5201 Hypermetropia, right eye: Secondary | ICD-10-CM | POA: Diagnosis not present

## 2023-04-16 DIAGNOSIS — J181 Lobar pneumonia, unspecified organism: Secondary | ICD-10-CM | POA: Diagnosis not present

## 2023-04-16 DIAGNOSIS — D500 Iron deficiency anemia secondary to blood loss (chronic): Secondary | ICD-10-CM | POA: Diagnosis not present

## 2023-04-16 DIAGNOSIS — E103213 Type 1 diabetes mellitus with mild nonproliferative diabetic retinopathy with macular edema, bilateral: Secondary | ICD-10-CM | POA: Diagnosis not present

## 2023-04-22 ENCOUNTER — Other Ambulatory Visit: Payer: Self-pay | Admitting: Cardiovascular Disease

## 2023-04-23 DIAGNOSIS — I480 Paroxysmal atrial fibrillation: Secondary | ICD-10-CM | POA: Diagnosis not present

## 2023-04-23 DIAGNOSIS — E1142 Type 2 diabetes mellitus with diabetic polyneuropathy: Secondary | ICD-10-CM | POA: Diagnosis not present

## 2023-04-23 DIAGNOSIS — I110 Hypertensive heart disease with heart failure: Secondary | ICD-10-CM | POA: Diagnosis not present

## 2023-04-23 DIAGNOSIS — I080 Rheumatic disorders of both mitral and aortic valves: Secondary | ICD-10-CM | POA: Diagnosis not present

## 2023-04-23 DIAGNOSIS — G928 Other toxic encephalopathy: Secondary | ICD-10-CM | POA: Diagnosis not present

## 2023-04-23 DIAGNOSIS — D500 Iron deficiency anemia secondary to blood loss (chronic): Secondary | ICD-10-CM | POA: Diagnosis not present

## 2023-04-23 DIAGNOSIS — I4892 Unspecified atrial flutter: Secondary | ICD-10-CM | POA: Diagnosis not present

## 2023-04-23 DIAGNOSIS — J181 Lobar pneumonia, unspecified organism: Secondary | ICD-10-CM | POA: Diagnosis not present

## 2023-04-23 DIAGNOSIS — I2510 Atherosclerotic heart disease of native coronary artery without angina pectoris: Secondary | ICD-10-CM | POA: Diagnosis not present

## 2023-05-02 ENCOUNTER — Ambulatory Visit: Payer: Medicare PPO | Attending: Physician Assistant | Admitting: Physician Assistant

## 2023-05-02 ENCOUNTER — Other Ambulatory Visit: Payer: Self-pay

## 2023-05-02 ENCOUNTER — Encounter: Payer: Self-pay | Admitting: Physician Assistant

## 2023-05-02 VITALS — BP 120/60 | HR 59 | Ht 72.0 in | Wt 183.0 lb

## 2023-05-02 DIAGNOSIS — I480 Paroxysmal atrial fibrillation: Secondary | ICD-10-CM

## 2023-05-02 DIAGNOSIS — E119 Type 2 diabetes mellitus without complications: Secondary | ICD-10-CM | POA: Diagnosis not present

## 2023-05-02 DIAGNOSIS — I4821 Permanent atrial fibrillation: Secondary | ICD-10-CM

## 2023-05-02 DIAGNOSIS — I25118 Atherosclerotic heart disease of native coronary artery with other forms of angina pectoris: Secondary | ICD-10-CM | POA: Diagnosis not present

## 2023-05-02 DIAGNOSIS — Z794 Long term (current) use of insulin: Secondary | ICD-10-CM | POA: Diagnosis not present

## 2023-05-02 DIAGNOSIS — I10 Essential (primary) hypertension: Secondary | ICD-10-CM

## 2023-05-02 DIAGNOSIS — E782 Mixed hyperlipidemia: Secondary | ICD-10-CM

## 2023-05-02 MED ORDER — ISOSORBIDE MONONITRATE ER 30 MG PO TB24: 30.0000 mg | ORAL_TABLET | Freq: Every day | ORAL | 3 refills | Status: DC

## 2023-05-02 NOTE — Patient Instructions (Signed)
Medication Instructions:  Your physician recommends that you continue on your current medications as directed. Please refer to the Current Medication list given to you today.  *If you need a refill on your cardiac medications before your next appointment, please call your pharmacy*   Lab Work: NONE If you have labs (blood work) drawn today and your tests are completely normal, you will receive your results only by: MyChart Message (if you have MyChart) OR A paper copy in the mail If you have any lab test that is abnormal or we need to change your treatment, we will call you to review the results.   Testing/Procedures: NONE   Follow-Up: At [facility], you and your health needs are our priority.  As part of our continuing mission to provide you with exceptional heart care, we have created designated Provider Care Teams.  These Care Teams include your primary Cardiologist (physician) and Advanced Practice Providers (APPs -  Physician Assistants and Nurse Practitioners) who all work together to provide you with the care you need, when you need it.  We recommend signing up for the patient portal called "MyChart".  Sign up information is provided on this After Visit Summary.  MyChart is used to connect with patients for Virtual Visits (Telemedicine).  Patients are able to view lab/test results, encounter notes, upcoming appointments, etc.  Non-urgent messages can be sent to your provider as well.   To learn more about what you can do with MyChart, go to [email or web address].    Your next appointment:   1 year(s)  Provider:   Jonathan Berry, MD    

## 2023-05-02 NOTE — Progress Notes (Unsigned)
Cardiology Office Note:  .   Date:  05/04/2023  ID:  [name], DOB Jan 23, 1930, MRN [id number] PCP: Verl Blalock  [facility] Providers Cardiologist:  Nanetta Batty, MD     History of Present Illness: .   Quindon Geckler is a 89 y.o. male with past medical history of remote tobacco abuse, hypertension, hyperlipidemia, DM2, CAD, carotid artery disease s/p right CEA by Dr. Arbie Cookey, and permanent atrial fibrillation.  He was diagnosed with atrial flutter with variable block at the [facility] in October 2013.  He is not on anticoagulation therapy due to advanced age, comorbidities and fall risk.  Echocardiogram in August 2020 showed normal EF, normal valve function.  Coronary CT obtained in October 2022 showed calcium score of 1207 with FFR analysis showing proximal OM 1 disease, there was tapering in the distal LAD as well.  He was managed medically, his Imdur was increased from 15 mg daily to 30 mg daily which resulted in resolution of chest discomfort.  Since the last visit, patient was admitted in April 2023 with weakness.  Creatinine was elevated at 1.8 on arrival, blood glucose over 500.  Serial enzyme was elevated around 500.  He had transient chest pain.  Imdur was further uptitrated to 60 mg daily.  It was felt patient likely had type II MI in the setting of elevated blood glucose and acute kidney injury.  Echocardiogram obtained during the hospitalization on 01/21/2022 showed EF 60 to 65%, no regional wall motion abnormality, normal RV, mild LAE, mild MR, mild AI.  After the hospitalization, patient never followed up with cardiology service.  He was seen in the ED in February 2024 with a fall.  CT of the head and neck was negative for acute process.  More recently, he was admitted on 02/20/2023 with altered mental status.  He was diagnosed with pneumonia.  His altered mental status was felt to be due to combination of underlying pneumonia and ciprofloxacin.  Cipro was  discontinued and the patient was treated with ceftriaxone and azithromycin.  Patient presents today for follow-up accompanied by his daughter.  He has chest discomfort with more strenuous activity but not with every day activity.  He has no lower extremity edema, orthopnea or PND.  He is on appropriate medications from the cardiac perspective.  He can follow-up in 1 year.  I discussed with both the patient and his daughter signs and the characteristic of unstable angina, patient currently has stable angina, however he will contact us if he is anginal symptom worsens.   ROS:   He denies chest pain, palpitations, dyspnea, pnd, orthopnea, n, v, dizziness, syncope, edema, weight gain, or early satiety. All other systems reviewed and are otherwise negative except as noted above.    Studies Reviewed: Marland Kitchen   EKG Interpretation Date/Time:  Friday May 02 2023 15:39:17 EDT Ventricular Rate:  59 PR Interval:    QRS Duration:  132 QT Interval:  474 QTC Calculation: 469 R Axis:   75  Text Interpretation: Atrial fibrillation with slow ventricular response Right bundle branch block Confirmed by Azalee Course [phone]) on 05/04/2023 10:14:07 PM    Cardiac Studies & Procedures     STRESS TESTS  MYOCARDIAL PERFUSION IMAGING 10/08/2012   ECHOCARDIOGRAM  ECHOCARDIOGRAM COMPLETE 01/21/2022  Narrative ECHOCARDIOGRAM REPORT    Patient Name:   [name]  Date of Exam: 01/21/2022 Medical Rec #:  [id number]  Height:       72.0 in Accession #:  [id number] Weight:       194.2 lb Date of Birth:  [birth year]   BSA:          2.104 m Patient Age:    89 years   BP:           127/83 mmHg Patient Gender: M          HR:           70 bpm. Exam Location:  Inpatient  Procedure: 2D Echo, Cardiac Doppler, Color Doppler and Intracardiac Opacification Agent  Indications:    NSTEMI I21.4  History:        Patient has prior history of Echocardiogram examinations, most recent 08/09/2021. Aortic Valve Disease and Mitral  Valve Disease; Risk Factors:Hypertension, Diabetes and Dyslipidemia. Chronic kidney disease.  Sonographer:    Leta Jungling RDCS Referring Phys: [id number] ALLISON WOLFE  IMPRESSIONS   1. Left ventricular ejection fraction, by estimation, is 60 to 65%. The left ventricle has normal function. The left ventricle has no regional wall motion abnormalities. Left ventricular diastolic function could not be evaluated. Elevated left atrial pressure. 2. Right ventricular systolic function is normal. The right ventricular size is normal. There is mildly elevated pulmonary artery systolic pressure. 3. Left atrial size was mildly dilated. 4. The mitral valve is abnormal. Mild mitral valve regurgitation. No evidence of mitral stenosis. Moderate mitral annular calcification. 5. The aortic valve is calcified. Aortic valve regurgitation is mild. No aortic stenosis is present. 6. Aortic dilatation noted. There is borderline dilatation of the aortic root, measuring 39 mm. 7. The inferior vena cava is dilated in size with >50% respiratory variability, suggesting right atrial pressure of 8 mmHg.  FINDINGS Left Ventricle: Left ventricular ejection fraction, by estimation, is 60 to 65%. The left ventricle has normal function. The left ventricle has no regional wall motion abnormalities. Definity contrast agent was given IV to delineate the left ventricular endocardial borders. The left ventricular internal cavity size was normal in size. There is no left ventricular hypertrophy. Left ventricular diastolic function could not be evaluated due to atrial fibrillation. Left ventricular diastolic function could not be evaluated. Elevated left atrial pressure.  Right Ventricle: The right ventricular size is normal. No increase in right ventricular wall thickness. Right ventricular systolic function is normal. There is mildly elevated pulmonary artery systolic pressure. The tricuspid regurgitant velocity is 2.89 m/s, and  with an assumed right atrial pressure of 8 mmHg, the estimated right ventricular systolic pressure is 41.4 mmHg.  Left Atrium: Left atrial size was mildly dilated.  Right Atrium: Right atrial size was normal in size.  Pericardium: There is no evidence of pericardial effusion.  Mitral Valve: The mitral valve is abnormal. There is moderate calcification of the mitral valve leaflet(s). Moderate mitral annular calcification. Mild mitral valve regurgitation. No evidence of mitral valve stenosis. MV peak gradient, 6.8 mmHg. The mean mitral valve gradient is 2.8 mmHg.  Tricuspid Valve: The tricuspid valve is normal in structure. Tricuspid valve regurgitation is mild . No evidence of tricuspid stenosis.  Aortic Valve: The aortic valve is calcified. Aortic valve regurgitation is mild. Aortic regurgitation PHT measures 308 msec. No aortic stenosis is present. Aortic valve mean gradient measures 5.0 mmHg. Aortic valve peak gradient measures 8.9 mmHg. Aortic valve area, by VTI measures 2.28 cm.  Pulmonic Valve: The pulmonic valve was normal in structure. Pulmonic valve regurgitation is trivial. No evidence of pulmonic stenosis.  Aorta: Aortic dilatation noted. There is borderline dilatation of the aortic root, measuring 39  mm.  Venous: The inferior vena cava is dilated in size with greater than 50% respiratory variability, suggesting right atrial pressure of 8 mmHg.  IAS/Shunts: No atrial level shunt detected by color flow Doppler.   LEFT VENTRICLE PLAX 2D LVIDd:         5.10 cm      Diastology LVIDs:         3.10 cm      LV e' medial:    4.99 cm/s LV PW:         1.10 cm      LV E/e' medial:  23.6 LV IVS:        1.10 cm      LV e' lateral:   5.78 cm/s LVOT diam:     2.20 cm      LV E/e' lateral: 20.4 LV SV:         75 LV SV Index:   35 LVOT Area:     3.80 cm  LV Volumes (MOD) LV vol d, MOD A2C: 98.3 ml LV vol d, MOD A4C: 115.0 ml LV vol s, MOD A2C: 31.9 ml LV vol s, MOD A4C: 35.7 ml LV  SV MOD A2C:     66.4 ml LV SV MOD A4C:     115.0 ml LV SV MOD BP:      80.8 ml  RIGHT VENTRICLE RV S prime:     10.10 cm/s TAPSE (M-mode): 1.3 cm  LEFT ATRIUM             Index        RIGHT ATRIUM           Index LA Vol (A2C):   64.6 ml 30.70 ml/m  RA Area:     18.00 cm LA Vol (A4C):   59.2 ml 28.13 ml/m  RA Volume:   49.20 ml  23.38 ml/m LA Biplane Vol: 65.3 ml 31.03 ml/m AORTIC VALVE AV Area (Vmax):    2.38 cm AV Area (Vmean):   2.20 cm AV Area (VTI):     2.28 cm AV Vmax:           149.25 cm/s AV Vmean:          107.750 cm/s AV VTI:            0.328 m AV Peak Grad:      8.9 mmHg AV Mean Grad:      5.0 mmHg LVOT Vmax:         93.25 cm/s LVOT Vmean:        62.250 cm/s LVOT VTI:          0.196 m LVOT/AV VTI ratio: 0.60 AI PHT:            308 msec  MITRAL VALVE                TRICUSPID VALVE MV Area (PHT): 2.71 cm     TR Peak grad:   33.4 mmHg MV Area VTI:   2.48 cm     TR Vmax:        289.00 cm/s MV Peak grad:  6.8 mmHg MV Mean grad:  2.8 mmHg     SHUNTS MV Vmax:       1.30 m/s     Systemic VTI:  0.20 m MV Vmean:      72.4 cm/s    Systemic Diam: 2.20 cm MV Decel Time: 280 msec MV E velocity: 118.00 cm/s  Olga Millers MD Electronically signed by Arlys John  Crenshaw MD Signature Date/Time: 01/21/2022/10:28:07 AM    Final     CT SCANS  CT CORONARY MORPH W/CTA COR W/SCORE 08/08/2021  Addendum 08/08/2021  3:23 PM ADDENDUM REPORT: 08/08/2021 15:20  HISTORY: 89 yo male with nonspecific chest pain, afib  EXAM: Cardiac/Coronary CTA  TECHNIQUE: The patient was scanned on a [employer].  PROTOCOL: A 130 kV retrospective scan was triggered in the descending thoracic aorta at 111 HU's. Axial non-contrast 3 mm slices were carried out through the heart. The data set was analyzed on a dedicated work station and scored using the Agatson method. Gantry rotation speed was 250 msecs and collimation was .6 mm. Beta blockade and 0.8 mg of sl NTG was given.  The 3D data set was reconstructed in 10% intervals of the 0-90 % of the R-R cycle. Diastolic phases were analyzed on a dedicated work station using MPR, MIP and VRT modes. The patient received 95mL OMNIPAQUE IOHEXOL 350 MG/ML SOLN of contrast.  FINDINGS: Quality: Very good, retrospective dataset, HR 44  Coronary calcium score: The patient's coronary artery calcium score is 1207, which places the patient in the 68th percentile based on an 87 yo cohort (top end of MESA age database).  Coronary arteries: Normal coronary origins.  Right dominance.  Right Coronary Artery: Dominant. Heavily calcified ostium with at least moderate 50-69% stenosis (CADRADS3).  Left Main Coronary Artery: Calcified proximal and distal LM with probably minimal 1-24% stenosis (CADRADS1). Bifurcates into the LAD and LCx arteries.  Left Anterior Descending Coronary Artery: Anterior vessel which coarses to the apex. There is minimal 1-24% mixed ostial stenosis (CADRADS1).  Left Circumflex Artery: AV groove vessel. Minimal to mild 25-49% proximal mixed stenosis (CADRADS2). Large OM branch with moderate-to severe mid-vessel filling defect (may be artifact).  Aorta: Normal size, 34 mm at the mid ascending aorta (level of the PA bifurcation) measured double oblique. Heavy atherosclerosis/calcification, particularly of the descending thoracic aorta. No dissection.  Aortic Valve: Trileaflet. Aortic annular and leaflet calcification. Aortic valve calcium score of 793.  Other findings:  Normal pulmonary vein drainage into the left atrium.  Normal left atrial appendage without a thrombus.  Dilated main pulmonary artery to 32 mm, suggestive of pulmonary hypertension.  Anterior and posterior mitral annular calcification.  IMPRESSION: 1. At least moderate mixed multivessel CAD, CADRADS = 3. CT FFR will be performed and reported separately.  2. Coronary artery calcium score is 1207, which places the  patient in the 68th percentile based on an 87 yo cohort (top end of MESA age database).  3. Normal coronary origin with right dominance.  4. Aortic annular and leaflet calcification - aortic valve calcium score of 793. Consider evaluation for aortic stenosis.  5. Heavy atherosclerosis / calcification of the descending thoracic aorta.  6. Dilated main pulmonary artery to 32 mm, suggestive of pulmonary hypertension.   Electronically Signed By: Chrystie Nose M.D. On: 08/08/2021 15:20  Narrative EXAM: OVER-READ INTERPRETATION  CT CHEST  The following report is an over-read performed by radiologist Dr. Irish Lack of [facility] on 08/08/2021. This over-read does not include interpretation of cardiac or coronary anatomy or pathology. The coronary CTA interpretation by the cardiologist is attached.  COMPARISON:  None.  FINDINGS: Vascular: No significant noncardiac vascular findings. Calcified plaque is present in the descending thoracic aorta.  Mediastinum/Nodes: Visualized mediastinum and hilar regions demonstrate no lymphadenopathy or masses.  Lungs/Pleura: Visualized lungs show no evidence of pulmonary edema, consolidation, pneumothorax, nodule or pleural fluid.  Upper Abdomen:  No acute abnormality.  Musculoskeletal: No chest wall mass or suspicious bone lesions identified.  IMPRESSION: No significant incidental findings.  Electronically Signed: By: Irish Lack M.D. On: 08/08/2021 13:44          Risk Assessment/Calculations:    CHA2DS2-VASc Score = 5   This indicates a 7.2% annual risk of stroke. The patient's score is based upon: CHF History: 0 HTN History: 1 Diabetes History: 1 Stroke History: 0 Vascular Disease History: 1 Age Score: 2 Gender Score: 0           Physical Exam:   VS:  BP 120/60 (BP Location: Left Arm, Patient Position: Sitting, Cuff Size: Normal)   Pulse (!) 59   Ht 6' (1.829 m)   Wt 183 lb (83 kg)   SpO2  97%   BMI 24.82 kg/m    Wt Readings from Last 3 Encounters:  05/02/23 183 lb (83 kg)  02/20/23 170 lb 13.7 oz (77.5 kg)  01/20/22 194 lb 3.6 oz (88.1 kg)    GEN: Well nourished, well developed in no acute distress NECK: No JVD; No carotid bruits CARDIAC: RRR, no murmurs, rubs, gallops RESPIRATORY:  Clear to auscultation without rales, wheezing or rhonchi  ABDOMEN: Soft, non-tender, non-distended EXTREMITIES:  No edema; No deformity   ASSESSMENT AND PLAN: .    Permanent atrial fibrillation: Not on Anticoagulation therapy due to advanced age, comorbidities and fall risk.  Heart rate very well-controlled  CAD: Previous coronary CT obtained in October 2022 showed proximal OM1 lesion and a tapering of distal LAD.  He was managed medically.  He responded well to antianginal medication.  He has stable angina.  He has chest discomfort with more strenuous activity but not with every day activity.  Will continue on the current therapy  Hypertension: Blood pressure well-controlled  Hyperlipidemia: On Lipitor  DM2: Managed by primary care provider.       Dispo: Follow-up with Dr. Allyson Sabal in 1 year  Signed, Azalee Course, [name]

## 2023-05-14 ENCOUNTER — Ambulatory Visit: Payer: Medicare PPO | Admitting: Podiatry

## 2023-05-20 ENCOUNTER — Inpatient Hospital Stay (HOSPITAL_COMMUNITY)
Admission: EM | Admit: 2023-05-20 | Discharge: 2023-05-23 | DRG: 394 | Disposition: A | Discharge status: Home Health | Payer: Medicare PPO | Attending: Internal Medicine | Admitting: Internal Medicine

## 2023-05-20 ENCOUNTER — Other Ambulatory Visit: Payer: Self-pay

## 2023-05-20 ENCOUNTER — Encounter (HOSPITAL_COMMUNITY): Payer: Self-pay | Admitting: Internal Medicine

## 2023-05-20 DIAGNOSIS — I4891 Unspecified atrial fibrillation: Secondary | ICD-10-CM | POA: Diagnosis present

## 2023-05-20 DIAGNOSIS — H9190 Unspecified hearing loss, unspecified ear: Secondary | ICD-10-CM | POA: Diagnosis present

## 2023-05-20 DIAGNOSIS — Z96651 Presence of right artificial knee joint: Secondary | ICD-10-CM | POA: Diagnosis present

## 2023-05-20 DIAGNOSIS — N400 Enlarged prostate without lower urinary tract symptoms: Secondary | ICD-10-CM | POA: Diagnosis present

## 2023-05-20 DIAGNOSIS — K921 Melena: Secondary | ICD-10-CM | POA: Diagnosis present

## 2023-05-20 DIAGNOSIS — E785 Hyperlipidemia, unspecified: Secondary | ICD-10-CM | POA: Diagnosis present

## 2023-05-20 DIAGNOSIS — Q399 Congenital malformation of esophagus, unspecified: Secondary | ICD-10-CM | POA: Diagnosis not present

## 2023-05-20 DIAGNOSIS — Z8249 Family history of ischemic heart disease and other diseases of the circulatory system: Secondary | ICD-10-CM | POA: Diagnosis not present

## 2023-05-20 DIAGNOSIS — D62 Acute posthemorrhagic anemia: Secondary | ICD-10-CM | POA: Diagnosis not present

## 2023-05-20 DIAGNOSIS — I10 Essential (primary) hypertension: Secondary | ICD-10-CM | POA: Diagnosis not present

## 2023-05-20 DIAGNOSIS — Z7982 Long term (current) use of aspirin: Secondary | ICD-10-CM

## 2023-05-20 DIAGNOSIS — Z79899 Other long term (current) drug therapy: Secondary | ICD-10-CM | POA: Diagnosis not present

## 2023-05-20 DIAGNOSIS — E1142 Type 2 diabetes mellitus with diabetic polyneuropathy: Secondary | ICD-10-CM | POA: Diagnosis not present

## 2023-05-20 DIAGNOSIS — K449 Diaphragmatic hernia without obstruction or gangrene: Secondary | ICD-10-CM | POA: Diagnosis present

## 2023-05-20 DIAGNOSIS — I2510 Atherosclerotic heart disease of native coronary artery without angina pectoris: Secondary | ICD-10-CM | POA: Diagnosis present

## 2023-05-20 DIAGNOSIS — K317 Polyp of stomach and duodenum: Principal | ICD-10-CM | POA: Diagnosis present

## 2023-05-20 DIAGNOSIS — C434 Malignant melanoma of scalp and neck: Secondary | ICD-10-CM | POA: Diagnosis present

## 2023-05-20 DIAGNOSIS — K3189 Other diseases of stomach and duodenum: Secondary | ICD-10-CM | POA: Diagnosis not present

## 2023-05-20 DIAGNOSIS — K2951 Unspecified chronic gastritis with bleeding: Secondary | ICD-10-CM | POA: Diagnosis not present

## 2023-05-20 DIAGNOSIS — K922 Gastrointestinal hemorrhage, unspecified: Secondary | ICD-10-CM | POA: Diagnosis present

## 2023-05-20 DIAGNOSIS — Z87891 Personal history of nicotine dependence: Secondary | ICD-10-CM | POA: Diagnosis not present

## 2023-05-20 DIAGNOSIS — K222 Esophageal obstruction: Secondary | ICD-10-CM | POA: Diagnosis present

## 2023-05-20 DIAGNOSIS — K254 Chronic or unspecified gastric ulcer with hemorrhage: Secondary | ICD-10-CM | POA: Diagnosis not present

## 2023-05-20 DIAGNOSIS — Z794 Long term (current) use of insulin: Secondary | ICD-10-CM

## 2023-05-20 DIAGNOSIS — Z7984 Long term (current) use of oral hypoglycemic drugs: Secondary | ICD-10-CM

## 2023-05-20 DIAGNOSIS — Z881 Allergy status to other antibiotic agents status: Secondary | ICD-10-CM

## 2023-05-20 DIAGNOSIS — E1151 Type 2 diabetes mellitus with diabetic peripheral angiopathy without gangrene: Secondary | ICD-10-CM | POA: Diagnosis present

## 2023-05-20 DIAGNOSIS — Z85820 Personal history of malignant melanoma of skin: Secondary | ICD-10-CM | POA: Diagnosis not present

## 2023-05-20 DIAGNOSIS — Z66 Do not resuscitate: Secondary | ICD-10-CM | POA: Diagnosis not present

## 2023-05-20 DIAGNOSIS — E119 Type 2 diabetes mellitus without complications: Secondary | ICD-10-CM

## 2023-05-20 LAB — CBC WITH DIFFERENTIAL/PLATELET
Abs Immature Granulocytes: 0.03 10*3/uL (ref 0.00–0.07)
Basophils Absolute: 0 10*3/uL (ref 0.0–0.1)
Basophils Relative: 1 %
Eosinophils Absolute: 0.3 10*3/uL (ref 0.0–0.5)
Eosinophils Relative: 4 %
HCT: 33.2 % — ABNORMAL LOW (ref 39.0–52.0)
Hemoglobin: 10.3 g/dL — ABNORMAL LOW (ref 13.0–17.0)
Immature Granulocytes: 0 %
Lymphocytes Relative: 15 %
Lymphs Abs: 1.2 10*3/uL (ref 0.7–4.0)
MCH: 29 pg (ref 26.0–34.0)
MCHC: 31 g/dL (ref 30.0–36.0)
MCV: 93.5 fL (ref 80.0–100.0)
Monocytes Absolute: 0.8 10*3/uL (ref 0.1–1.0)
Monocytes Relative: 10 %
Neutro Abs: 5.8 10*3/uL (ref 1.7–7.7)
Neutrophils Relative %: 70 %
Platelets: 185 10*3/uL (ref 150–400)
RBC: 3.55 MIL/uL — ABNORMAL LOW (ref 4.22–5.81)
RDW: 18.3 % — ABNORMAL HIGH (ref 11.5–15.5)
WBC: 8.2 10*3/uL (ref 4.0–10.5)
nRBC: 0 % (ref 0.0–0.2)

## 2023-05-20 LAB — COMPREHENSIVE METABOLIC PANEL
ALT: 14 U/L (ref 0–44)
AST: 17 U/L (ref 15–41)
Albumin: 3.7 g/dL (ref 3.5–5.0)
Alkaline Phosphatase: 81 U/L (ref 38–126)
Anion gap: 7 (ref 5–15)
BUN: 39 mg/dL — ABNORMAL HIGH (ref 8–23)
CO2: 23 mmol/L (ref 22–32)
Calcium: 9.1 mg/dL (ref 8.9–10.3)
Chloride: 107 mmol/L (ref 98–111)
Creatinine, Ser: 1.4 mg/dL — ABNORMAL HIGH (ref 0.61–1.24)
GFR, Estimated: 47 mL/min — ABNORMAL LOW (ref >60)
Glucose, Bld: 230 mg/dL — ABNORMAL HIGH (ref 70–99)
Potassium: 4.4 mmol/L (ref 3.5–5.1)
Sodium: 137 mmol/L (ref 135–145)
Total Bilirubin: 0.8 mg/dL (ref 0.3–1.2)
Total Protein: 7 g/dL (ref 6.5–8.1)

## 2023-05-20 LAB — PROTIME-INR
INR: 1 (ref 0.8–1.2)
Prothrombin Time: 13.8 seconds (ref 11.4–15.2)

## 2023-05-20 LAB — TYPE AND SCREEN
ABO/RH(D): A POS
Antibody Screen: NEGATIVE

## 2023-05-20 MED ORDER — ONDANSETRON HCL 4 MG/2ML IJ SOLN: 4.0000 mg | Freq: Four times a day (QID) | INTRAMUSCULAR | Status: DC | PRN

## 2023-05-20 MED ORDER — PANTOPRAZOLE SODIUM 40 MG IV SOLR
40.0000 mg | Freq: Once | INTRAVENOUS | Status: AC
  Administered 2023-05-20: 40 mg via INTRAVENOUS
  Filled 2023-05-20: qty 10

## 2023-05-20 MED ORDER — ONDANSETRON HCL 4 MG PO TABS: 4.0000 mg | ORAL_TABLET | Freq: Four times a day (QID) | ORAL | Status: DC | PRN

## 2023-05-20 MED ORDER — LACTATED RINGERS IV SOLN: INTRAVENOUS | Status: DC

## 2023-05-20 MED ORDER — ACETAMINOPHEN 650 MG RE SUPP: 650.0000 mg | Freq: Four times a day (QID) | RECTAL | Status: DC | PRN

## 2023-05-20 MED ORDER — PANTOPRAZOLE SODIUM 40 MG IV SOLR
40.0000 mg | Freq: Two times a day (BID) | INTRAVENOUS | Status: DC
  Administered 2023-05-21 – 2023-05-23 (×5): 40 mg via INTRAVENOUS
  Filled 2023-05-20 (×5): qty 10

## 2023-05-20 MED ORDER — ACETAMINOPHEN 325 MG PO TABS: 650.0000 mg | ORAL_TABLET | Freq: Four times a day (QID) | ORAL | Status: DC | PRN

## 2023-05-20 NOTE — ED Provider Notes (Signed)
[place] EMERGENCY DEPARTMENT AT [facility] Provider Note   CSN: [id number] Arrival date & time: 05/20/23  1428     History  Chief Complaint  Patient presents with   Melena    [name] is a 89 y.o. male.  89 year old male who presents with black stool times several days.  Guaiac the stool at home which was positive.  Does have history of gastric ulcers.  Denies hematemesis.  No bright red blood per rectum.  Does endorse some weakness.  No severe abdominal discomfort.  Called his gastroenterologist and told to come here.       Home Medications Prior to Admission medications   Medication Sig Start Date End Date Taking? Authorizing Provider  acetaminophen (TYLENOL) 500 MG tablet Take 500 mg by mouth every 6 (six) hours as needed for moderate pain (for pain).    [provider]  aspirin EC 81 MG tablet Take 1 tablet (81 mg total) by mouth daily. 07/13/19   Runell Gess, MD  atorvastatin (LIPITOR) 80 MG tablet Take 80 mg by mouth every evening.     [provider]  cyanocobalamin 1000 MCG tablet Take 1 tablet (1,000 mcg total) by mouth daily. 02/23/23   Shalhoub, Deno Lunger, MD  cycloSPORINE (RESTASIS) 0.05 % ophthalmic emulsion Place 1 drop into both eyes 2 (two) times daily.    [provider]  Dextrose, Diabetic Use, (GLUCOSE PO) Take 1 tablet by mouth daily as needed (Low blood sugar).    [provider]  diltiazem (CARDIZEM LA) 180 MG 24 hr tablet Take 180 mg by mouth daily. 04/29/22   [provider]  empagliflozin (JARDIANCE) 25 MG TABS tablet Take 25 mg by mouth daily. 05/02/22   [provider]  furosemide (LASIX) 20 MG tablet Take 1 tablet (20 mg total) by mouth as needed (Weigh yourself daily.  If weight increases by more than 2 pounds from day before or 5 pounds compared to prior week take one dose.). Patient not taking: Reported on 05/02/2023 02/22/23 02/22/24  Marinda Elk, MD  gabapentin (NEURONTIN) 800  MG tablet Take 800 mg by mouth 3 (three) times daily. 08/01/21   [provider]  insulin detemir (LEVEMIR) 100 UNIT/ML injection Inject 0.05-0.1 mLs (5-10 Units total) into the skin at bedtime. Patient not taking: Reported on 05/02/2023 02/22/23   Marinda Elk, MD  isosorbide mononitrate (IMDUR) 30 MG 24 hr tablet Take 1 tablet (30 mg total) by mouth daily. 05/02/23   Runell Gess, MD  LANTUS SOLOSTAR 100 UNIT/ML Solostar Pen Inject into the skin. 03/24/23   [provider]  lidocaine (LIDODERM) 5 % Place 1 patch onto the skin daily as needed (for pain- on 12 hours/off 12 hours). 08/07/21   [provider]  losartan (COZAAR) 25 MG tablet Take 1 tablet (25 mg total) by mouth daily before breakfast. 01/22/22 02/21/23  Dahal, Melina Schools, MD  mupirocin cream (BACTROBAN) 2 % Apply 1 Application topically 2 (two) times daily. Patient not taking: Reported on 05/02/2023 12/14/22   Palumbo, April, MD  nitroGLYCERIN (NITROSTAT) 0.3 MG SL tablet Place 0.3 mg under the tongue every 5 (five) minutes as needed for chest pain. Patient not taking: Reported on 05/02/2023 07/31/21   [provider]  Omega-3 1000 MG CAPS Take 2,000 mg by mouth daily. Patient not taking: Reported on 05/02/2023    [provider]  omeprazole (PRILOSEC) 20 MG capsule Take 20 mg by mouth daily.    [provider]  traZODone (DESYREL) 100 MG tablet Take 100 mg by mouth at bedtime.    [provider]      Allergies    Ciprofloxacin    Review of Systems   Review of Systems  All other systems reviewed and are negative.   Physical Exam Updated Vital Signs BP (!) 159/66   Pulse 76   Temp 97.8 F (36.6 C) (Oral)   Resp 17   Ht 1.829 m (6')   Wt 83 kg   SpO2 99%   BMI 24.82 kg/m  Physical Exam Vitals and nursing note reviewed.  Constitutional:      General: He is not in acute distress.    Appearance: Normal appearance. He is well-developed. He is not toxic-appearing.   HENT:     Head: Normocephalic and atraumatic.  Eyes:     General: Lids are normal.     Conjunctiva/sclera: Conjunctivae normal.     Pupils: Pupils are equal, round, and reactive to light.  Neck:     Thyroid: No thyroid mass.     Trachea: No tracheal deviation.  Cardiovascular:     Rate and Rhythm: Normal rate and regular rhythm.     Heart sounds: Normal heart sounds. No murmur heard.    No gallop.  Pulmonary:     Effort: Pulmonary effort is normal. No respiratory distress.     Breath sounds: Normal breath sounds. No stridor. No decreased breath sounds, wheezing, rhonchi or rales.  Abdominal:     General: There is no distension.     Palpations: Abdomen is soft.     Tenderness: There is no abdominal tenderness. There is no rebound.  Musculoskeletal:        General: No tenderness. Normal range of motion.     Cervical back: Normal range of motion and neck supple.  Skin:    General: Skin is warm and dry.     Findings: No abrasion or rash.  Neurological:     Mental Status: He is alert and oriented to person, place, and time. Mental status is at baseline.     GCS: GCS eye subscore is 4. GCS verbal subscore is 5. GCS motor subscore is 6.     Cranial Nerves: Cranial nerves are intact. No cranial nerve deficit.     Sensory: No sensory deficit.     Motor: Motor function is intact.  Psychiatric:        Attention and Perception: Attention normal.        Speech: Speech normal.        Behavior: Behavior normal.     ED Results / Procedures / Treatments   Labs (all labs ordered are listed, but only abnormal results are displayed) Labs Reviewed  COMPREHENSIVE METABOLIC PANEL - Abnormal; Notable for the following components:      Result Value   Glucose, Bld 230 (*)    BUN 39 (*)    Creatinine, Ser 1.40 (*)    GFR, Estimated 47 (*)    All other components within normal limits  CBC WITH DIFFERENTIAL/PLATELET - Abnormal; Notable for the following components:   RBC 3.55 (*)     Hemoglobin 10.3 (*)    HCT 33.2 (*)    RDW 18.3 (*)    All other components within normal limits  PROTIME-INR  TYPE AND SCREEN    EKG None  Radiology No results found.  Procedures Procedures    Medications Ordered in ED Medications  pantoprazole (PROTONIX) injection 40 mg (  has no administration in time range)    ED Course/ Medical Decision Making/ A&P                                 Medical Decision Making Risk Prescription drug management.   Patient's hemoglobin is stable at this time.  Will start on IV Protonix.  Case discussed with Dr. Bosie Clos from GI who recommends admission for observation and GI consultation in the morning and likely endoscopy.  Will consult hospitalist team        Final Clinical Impression(s) / ED Diagnoses Final diagnoses:  None    Rx / DC Orders ED Discharge Orders     None         Lorre Nick, MD 05/20/23 2037

## 2023-05-20 NOTE — ED Triage Notes (Signed)
Pt sent to ED from PCP c/o black stools. Pt reports weakness

## 2023-05-20 NOTE — ED Provider Triage Note (Signed)
Emergency Medicine Provider Triage Evaluation Note  White Engelberg , a 89 y.o. male  was evaluated in triage.  Pt complains of melana. Reports black tarry stools since sautrday. No nausea or vomiting. Takes a baby ASA per day but denies other blood thinners. Denies SOB but states he has been fatigued.   Review of Systems  Positive: See above Negative: See above  Physical Exam  BP (!) 144/61 (BP Location: Left Arm)   Pulse 82   Temp 98 F (36.7 C) (Oral)   Resp 16   Ht 6' (1.829 m)   Wt 83 kg   SpO2 99%   BMI 24.82 kg/m  Gen:   Awake, no distress   Resp:  Normal effort  MSK:   Moves extremities without difficulty  Other:    Medical Decision Making  Medically screening exam initiated at 3:49 PM.  Appropriate orders placed.  Terryn Belgard was informed that the remainder of the evaluation will be completed by another provider, this initial triage assessment does not replace that evaluation, and the importance of remaining in the ED until their evaluation is complete.  Work up started   Gareth Eagle, PA-C 05/20/23 1550

## 2023-05-20 NOTE — H&P (Addendum)
History and Physical    Patient: [name] WUJ:[id number] DOB: 09-26-1930 DOA: 05/20/2023 DOS: the patient was seen and examined on 05/20/2023 PCP: Sheliah Hatch, PA-C  Patient coming from: Home  Chief Complaint:  Chief Complaint  Patient presents with   Melena   HPI: [name] is a 89 y.o. male with medical history significant for atrial fibrillation, type 2 diabetes mellitus, hypertension, BPH, and malignant melanoma of the head and neck who presents with 3 days of black tarry stools.  The patient's daughter is a Engineer, civil (consulting) and she advised them to go get a Hemoccult test from the drugstore which they did.  It was Hemoccult positive so they called [facility] and they were told to come to the ER.  The patient follows with Dr. Dulce Sellar for his routine colonoscopies.  He had a bleeding ulcer 40 years ago.  The patient denies any nausea, vomiting, abdominal pain, chest pain or shortness of breath.  His appetite has been unchanged.  They do not take NSAIDs or over-the-counter medication.   Review of Systems: As mentioned in the history of present illness. All other systems reviewed and are negative. Past Medical History:  Diagnosis Date   Anemia    related to frequent nosebleeds-right nostril.   Aortic insufficiency    a. mild by echo 2013.   Arthritis    arthritis -back knees, most joints   Atrial flutter (HCC)    a. per report in 2013.   Breathing difficulty 11-10-12   difficult to breath if laying posteriorly, right nare difficulty   Carotid artery disease (HCC)    a. Remote R CEA in the 1990s per pt.   Change in hearing    Diabetes mellitus age 81   Diabetic peripheral neuropathy associated with type 2 diabetes mellitus (HCC) 07/08/2022   Dysrhythmia 07/2012   hx. Atrial Flutter x1, converted spontaneously-no ploblems.    H/O hiatal hernia    HOH (hard of hearing) 11-10-12   bilaterally   Hyperlipidemia    Hypertension    Ulcer 1995   bleeding ulcer, none since   Past Surgical  History:  Procedure Laterality Date   CAROTID DUPLEX  06/30/2012   PATENT RIGHT CAROTID ENDARTERECTOMY. 1%-39% LEFT ICA. STABLE   CAROTID ENDARTERECTOMY  11/242008   right with Dacron patch angioplasty   CATARACT EXTRACTION  1998 bilateral  done 3 months apart   CHOLECYSTECTOMY N/A 06/11/2019   Procedure: LAPAROSCOPIC CHOLECYSTECTOMY;  Surgeon: Harriette Bouillon, MD;  Location: [facility] OR;  Service: General;  Laterality: N/A;   ESOPHAGOGASTRODUODENOSCOPY (EGD) WITH PROPOFOL N/A 06/08/2019   Procedure: ESOPHAGOGASTRODUODENOSCOPY (EGD) WITH PROPOFOL;  Surgeon: Charlott Rakes, MD;  Location: [facility] ENDOSCOPY;  Service: Endoscopy;  Laterality: N/A;   EYE SURGERY     JOINT REPLACEMENT Right 11-18-12   KNEE BURSECTOMY  11/18/2012   Procedure: KNEE BURSECTOMY;  Surgeon: Jacki Cones, MD;  Location: [facility] ORS;  Service: Orthopedics;  Laterality: Right;   NM MYOCAR MULTIPLE W/SPECT  10/08/2012   NORMAL STRESS NUCLEAR STUDY.   TOTAL KNEE ARTHROPLASTY  11/18/2012   Procedure: TOTAL KNEE ARTHROPLASTY;  Surgeon: Jacki Cones, MD;  Location: [facility] ORS;  Service: Orthopedics;  Laterality: Right;   TRANSTHORACIC ECHOCARDIOGRAM  10/08/2012   MODERATE LVH. MODERATE CONCENTRIC HYPERTROPHY.EF 65 TO 70%. GRADE 1 DYSTOLIC DYSFUNCTION. ELEVATED VENTRICULAR END-DIASTOLIC FILLING PRESSURE AND LEFT ATRIAL FILLING PRESSURE. AV- MILD TO MODERATE CALCIFIED ANNULUS. MV- CALCIFIC DEGENERATION.Marland Kitchen LA-MILDLY DILATED.   Social History:  reports that he quit smoking about 46 years  ago. His smoking use included cigarettes. He has been exposed to tobacco smoke. He has never used smokeless tobacco. He reports current alcohol use of about 1.0 standard drink of alcohol per week. He reports that he does not use drugs.  Allergies  Allergen Reactions   Ciprofloxacin Other (See Comments)    Confusion    Family History  Problem Relation Age of Onset   Cancer Mother        Pancreatic   Heart disease Mother    Cancer Brother         pancreactic   Cancer Daughter        Breast    Prior to Admission medications   Medication Sig Start Date End Date Taking? Authorizing Provider  acetaminophen (TYLENOL) 500 MG tablet Take 500 mg by mouth every 6 (six) hours as needed for moderate pain (for pain).   Yes [provider]  aspirin EC 81 MG tablet Take 1 tablet (81 mg total) by mouth daily. 07/13/19  Yes Runell Gess, MD  atorvastatin (LIPITOR) 80 MG tablet Take 80 mg by mouth every evening.    Yes [provider]  cyanocobalamin 1000 MCG tablet Take 1 tablet (1,000 mcg total) by mouth daily. 02/23/23  Yes Shalhoub, Deno Lunger, MD  cycloSPORINE (RESTASIS) 0.05 % ophthalmic emulsion Place 1 drop into both eyes 2 (two) times daily.   Yes [provider]  Dextrose, Diabetic Use, (GLUCOSE PO) Take 1 tablet by mouth daily as needed (Low blood sugar).   Yes [provider]  diltiazem (CARDIZEM LA) 180 MG 24 hr tablet Take 180 mg by mouth daily. 04/29/22  Yes [provider]  empagliflozin (JARDIANCE) 25 MG TABS tablet Take 25 mg by mouth daily. 05/02/22  Yes [provider]  gabapentin (NEURONTIN) 800 MG tablet Take 800 mg by mouth 3 (three) times daily. 08/01/21  Yes [provider]  isosorbide mononitrate (IMDUR) 30 MG 24 hr tablet Take 1 tablet (30 mg total) by mouth daily. 05/02/23  Yes Runell Gess, MD  LANTUS SOLOSTAR 100 UNIT/ML Solostar Pen Inject 5-9 Units into the skin at bedtime. Sliding scale 03/24/23  Yes [provider]  lidocaine (LIDODERM) 5 % Place 1 patch onto the skin daily as needed (for pain- on 12 hours/off 12 hours). 08/07/21  Yes [provider]  losartan (COZAAR) 25 MG tablet Take 1 tablet (25 mg total) by mouth daily before breakfast. 01/22/22 05/20/23 Yes Dahal, Melina Schools, MD  Omega-3 1000 MG CAPS Take 2,000 mg by mouth daily.   Yes [provider]  omeprazole (PRILOSEC) 20 MG capsule Take 20 mg by mouth daily.   Yes [provider]  pioglitazone (ACTOS) 45 MG tablet Take 45 mg by mouth daily. 05/06/23  Yes [provider]  traZODone (DESYREL) 100 MG tablet Take 100 mg by mouth at bedtime.   Yes [provider]  furosemide (LASIX) 20 MG tablet Take 1 tablet (20 mg total) by mouth as needed (Weigh yourself daily.  If weight increases by more than 2 pounds from day before or 5 pounds compared to prior week take one dose.). Patient not taking: Reported on 05/02/2023 02/22/23 02/22/24  Marinda Elk, MD  insulin detemir (LEVEMIR) 100 UNIT/ML injection Inject 0.05-0.1 mLs (5-10 Units total) into the skin at bedtime. Patient not taking: Reported on 05/02/2023 02/22/23   Marinda Elk, MD  mupirocin cream (BACTROBAN) 2 % Apply 1 Application topically 2 (two) times daily. Patient not taking: Reported  on 05/02/2023 12/14/22   Palumbo, April, MD  nitroGLYCERIN (NITROSTAT) 0.3 MG SL tablet Place 0.3 mg under the tongue every 5 (five) minutes as needed for chest pain. Patient not taking: Reported on 05/02/2023 07/31/21   [provider]    Physical Exam: Vitals:   05/20/23 2130 05/20/23 2200 05/20/23 2210 05/20/23 2215  BP: (!) 176/74  (!) 161/78   Pulse: 68 79 64   Resp:  19 17   Temp:   97.9 F (36.6 C)   TempSrc:   Oral   SpO2: 100% 100% 100%   Weight:    79.6 kg  Height:    6' (1.829 m)   Physical Exam:  General: No acute distress, elderly HEENT: Normocephalic, atraumatic, PERRL, ptosis Cardiovascular: Normal rate and rhythm. Distal pulses intact. Pulmonary: Normal pulmonary effort, normal breath sounds Gastrointestinal: Nondistended abdomen, soft, tender in RLQ, normoactive bowel sounds, no organomegaly Musculoskeletal:Normal ROM, no lower ext edema Lymphadenopathy: No cervical LAD. Skin: Skin is warm and dry. Neuro: No focal deficits noted, AAOx3. PSYCH: Attentive and cooperative  Data Reviewed:  Results for orders placed or performed during the hospital encounter of  05/20/23 (from the past 24 hour(s))  Comprehensive metabolic panel     Status: Abnormal   Collection Time: 05/20/23  4:02 PM  Result Value Ref Range   Sodium 137 135 - 145 mmol/L   Potassium 4.4 3.5 - 5.1 mmol/L   Chloride 107 98 - 111 mmol/L   CO2 23 22 - 32 mmol/L   Glucose, Bld 230 (H) 70 - 99 mg/dL   BUN 39 (H) 8 - 23 mg/dL   Creatinine, Ser 3.66 (H) 0.61 - 1.24 mg/dL   Calcium 9.1 8.9 - 44.0 mg/dL   Total Protein 7.0 6.5 - 8.1 g/dL   Albumin 3.7 3.5 - 5.0 g/dL   AST 17 15 - 41 U/L   ALT 14 0 - 44 U/L   Alkaline Phosphatase 81 38 - 126 U/L   Total Bilirubin 0.8 0.3 - 1.2 mg/dL   GFR, Estimated 47 (L) >60 mL/min   Anion gap 7 5 - 15  CBC with Differential     Status: Abnormal   Collection Time: 05/20/23  4:02 PM  Result Value Ref Range   WBC 8.2 4.0 - 10.5 K/uL   RBC 3.55 (L) 4.22 - 5.81 MIL/uL   Hemoglobin 10.3 (L) 13.0 - 17.0 g/dL   HCT 34.7 (L) 42.5 - 95.6 %   MCV 93.5 80.0 - 100.0 fL   MCH 29.0 26.0 - 34.0 pg   MCHC 31.0 30.0 - 36.0 g/dL   RDW 38.7 (H) 56.4 - 33.2 %   Platelets 185 150 - 400 K/uL   nRBC 0.0 0.0 - 0.2 %   Neutrophils Relative % 70 %   Neutro Abs 5.8 1.7 - 7.7 K/uL   Lymphocytes Relative 15 %   Lymphs Abs 1.2 0.7 - 4.0 K/uL   Monocytes Relative 10 %   Monocytes Absolute 0.8 0.1 - 1.0 K/uL   Eosinophils Relative 4 %   Eosinophils Absolute 0.3 0.0 - 0.5 K/uL   Basophils Relative 1 %   Basophils Absolute 0.0 0.0 - 0.1 K/uL   Immature Granulocytes 0 %   Abs Immature Granulocytes 0.03 0.00 - 0.07 K/uL  Protime-INR     Status: None   Collection Time: 05/20/23  4:02 PM  Result Value Ref Range   Prothrombin Time 13.8 11.4 - 15.2 seconds   INR 1.0 0.8 - 1.2  Type and screen [facility]     Status: None   Collection Time: 05/20/23  4:08 PM  Result Value Ref Range   ABO/RH(D) A POS    Antibody Screen NEG    Sample Expiration      05/23/2023,2359 Performed at [facility], [address] [place], [place]  [address]      Assessment and Plan: Melena - [facility] consulted. - Monitor Hgb - NPO - IV Protonix  2. Htn - BP is high. Will hold Imdur and Losartan unless needed. 3. DMT2 - Hold Jardiance, Insulin while NPO    Advance Care Planning: DNR.  The patient and his wife say they have done their advanced directives.  There is no copy noted in the chart.  He names his wife as his surrogate Management consultant.  Consults: GI  Family Communication: Discussed with wife in room.  Severity of Illness: inpatient  Author: Buena Irish, MD 05/20/2023 11:16 PM  For on call review [email or web address].

## 2023-05-21 DIAGNOSIS — K922 Gastrointestinal hemorrhage, unspecified: Secondary | ICD-10-CM | POA: Diagnosis present

## 2023-05-21 LAB — GLUCOSE, CAPILLARY
Glucose-Capillary: 242 mg/dL — ABNORMAL HIGH (ref 70–99)
Glucose-Capillary: 142 mg/dL — ABNORMAL HIGH (ref 70–99)

## 2023-05-21 MED ORDER — CYCLOSPORINE 0.05 % OP EMUL
1.0000 [drp] | Freq: Two times a day (BID) | OPHTHALMIC | Status: DC
  Administered 2023-05-21 – 2023-05-23 (×3): 1 [drp] via OPHTHALMIC
  Filled 2023-05-21 (×4): qty 30

## 2023-05-21 MED ORDER — DILTIAZEM HCL ER COATED BEADS 180 MG PO CP24
180.0000 mg | ORAL_CAPSULE | Freq: Every day | ORAL | Status: DC
  Administered 2023-05-21 – 2023-05-23 (×3): 180 mg via ORAL
  Filled 2023-05-21 (×4): qty 1

## 2023-05-21 MED ORDER — INSULIN GLARGINE-YFGN 100 UNIT/ML ~~LOC~~ SOLN
5.0000 [IU] | Freq: Every day | SUBCUTANEOUS | Status: DC
  Administered 2023-05-21 – 2023-05-22 (×2): 5 [IU] via SUBCUTANEOUS
  Filled 2023-05-21 (×3): qty 0.05

## 2023-05-21 MED ORDER — OMEGA-3-ACID ETHYL ESTERS 1 G PO CAPS
2.0000 g | ORAL_CAPSULE | Freq: Every day | ORAL | Status: DC
  Administered 2023-05-21 – 2023-05-23 (×3): 2 g via ORAL
  Filled 2023-05-21 (×4): qty 2

## 2023-05-21 MED ORDER — GABAPENTIN 400 MG PO CAPS
800.0000 mg | ORAL_CAPSULE | Freq: Three times a day (TID) | ORAL | Status: DC
  Filled 2023-05-21: qty 2

## 2023-05-21 MED ORDER — TRAZODONE HCL 100 MG PO TABS
100.0000 mg | ORAL_TABLET | Freq: Every day | ORAL | Status: DC
  Administered 2023-05-21 – 2023-05-22 (×2): 100 mg via ORAL
  Filled 2023-05-21 (×2): qty 1

## 2023-05-21 MED ORDER — GABAPENTIN 400 MG PO CAPS
800.0000 mg | ORAL_CAPSULE | Freq: Three times a day (TID) | ORAL | Status: DC
  Administered 2023-05-21 – 2023-05-23 (×6): 800 mg via ORAL
  Filled 2023-05-21 (×6): qty 2

## 2023-05-21 MED ORDER — DILTIAZEM HCL ER 180 MG PO TB24: 180.0000 mg | ORAL_TABLET | Freq: Every day | ORAL | Status: DC

## 2023-05-21 MED ORDER — INSULIN ASPART 100 UNIT/ML IJ SOLN
0.0000 [IU] | Freq: Every day | INTRAMUSCULAR | Status: DC
  Filled 2023-05-21: qty 0.05

## 2023-05-21 MED ORDER — VITAMIN B-12 1000 MCG PO TABS: 1000.0000 ug | ORAL_TABLET | Freq: Every day | ORAL | Status: DC

## 2023-05-21 MED ORDER — VITAMIN B-12 1000 MCG PO TABS
1000.0000 ug | ORAL_TABLET | Freq: Every day | ORAL | Status: DC
  Administered 2023-05-21 – 2023-05-23 (×3): 1000 ug via ORAL
  Filled 2023-05-21 (×3): qty 1

## 2023-05-21 MED ORDER — ACETAMINOPHEN 500 MG PO TABS
500.0000 mg | ORAL_TABLET | Freq: Four times a day (QID) | ORAL | Status: DC | PRN
  Administered 2023-05-22: 500 mg via ORAL
  Filled 2023-05-21: qty 1

## 2023-05-21 MED ORDER — OMEGA-3 1000 MG PO CAPS: 2000.0000 mg | ORAL_CAPSULE | Freq: Every day | ORAL | Status: DC

## 2023-05-21 MED ORDER — INSULIN ASPART 100 UNIT/ML IJ SOLN
0.0000 [IU] | Freq: Three times a day (TID) | INTRAMUSCULAR | Status: DC
  Administered 2023-05-21: 3 [IU] via SUBCUTANEOUS
  Administered 2023-05-22: 2 [IU] via SUBCUTANEOUS
  Administered 2023-05-22: 1 [IU] via SUBCUTANEOUS
  Administered 2023-05-23: 2 [IU] via SUBCUTANEOUS
  Administered 2023-05-23: 3 [IU] via SUBCUTANEOUS
  Filled 2023-05-21: qty 0.09

## 2023-05-21 NOTE — ED Notes (Signed)
Assumed care, pt AAOx4, wife at bedside. Updated on POC. Nad noted. Resp even and unlabored. Offers no complaints

## 2023-05-21 NOTE — Progress Notes (Signed)
PROGRESS NOTE    [name]  WUJ:[id number] DOB: 11-29-1929 DOA: 05/20/2023 PCP: Sheliah Hatch, PA-C    Brief Narrative:  89 year old gentleman with history of A-fib not on anticoagulation, type 2 diabetes, hypertension, BPH presented with 3 days of melanotic stool.  They had Hemoccult test done at home that was positive, called primary care physician and was asked to go to ER.  Patient denied any upper abdominal symptoms.  Hemoglobin was 10 with recent hemoglobin of 11.   Assessment & Plan:   Suspected upper GI bleeding, acute blood loss anemia: Baseline hemoglobin about 11.  Presented hemoglobin 10.  Hemoglobin 9 today.  Hemodynamically stable.  No evidence of active bleeding.  Remains on Protonix infusion.  Clears today.  Scheduled for upper GI endoscopy tomorrow.  Continue to monitor hemoglobin every 12 hours, transfuse for less than 8. Holding aspirin.  Essential hypertension blood pressure is stable.  Continue to hold antihypertensive with risk of hypotension.  Type 2 diabetes, well-controlled: Patient on Jardiance, insulin, Actos at home.  Remains on low-dose of insulin.     DVT prophylaxis: SCDs Start: 05/20/23 2054   Code Status: Full code Family Communication: Daughter Dasean Boutell called and updated.  Disposition Plan: Status is: Inpatient Remains inpatient appropriate because: Inpatient procedures planned     Consultants:  Gastroenterology  Procedures:  None  Antimicrobials:  None   Subjective: Patient seen and examined.  Did not get good sleep but denies any complaints.  Denies any nausea vomiting abdominal pain or cramping.  No bowel movements since last night.  No active bleeding.  Objective: Vitals:   05/21/23 1207 05/21/23 1300 05/21/23 1400 05/21/23 1415  BP: (!) 134/110 (!) 144/62 (!) 148/60   Pulse: 64 71 (!) 50 68  Resp: 16 15 13 17   Temp: 98.2 F (36.8 C)     TempSrc: Oral     SpO2: 98% 98% 98% 100%  Weight:      Height:         Intake/Output Summary (Last 24 hours) at 05/21/2023 1505 Last data filed at 05/20/2023 2214 Gross per 24 hour  Intake --  Output 300 ml  Net -300 ml   Filed Weights   05/20/23 1541 05/20/23 2215  Weight: 83 kg 79.6 kg    Examination:  General exam: Appears calm and comfortable  Very hard of hearing.  Comfortable to conversation and pleasant. Respiratory system: No added sounds. Cardiovascular system: S1 & S2 heard, RRR. Gastrointestinal system: Abdomen is nondistended, soft and nontender. No organomegaly or masses felt. Normal bowel sounds heard. Central nervous system: Alert and oriented. No focal neurological deficits.    Data Reviewed: I have personally reviewed following labs and imaging studies  CBC: Recent Labs  Lab 05/20/23 1602 05/21/23 0614  WBC 8.2 6.1  NEUTROABS 5.8  --   HGB 10.3* 9.1*  HCT 33.2* 29.2*  MCV 93.5 93.0  PLT 185 175   Basic Metabolic Panel: Recent Labs  Lab 05/20/23 1602 05/21/23 0614  NA 137 140  K 4.4 3.9  CL 107 108  CO2 23 23  GLUCOSE 230* 148*  BUN 39* 30*  CREATININE 1.40* 1.18  CALCIUM 9.1 9.0  MG  --  2.2   GFR: Estimated Creatinine Clearance: 44.8 mL/min (by C-G formula based on SCr of 1.18 mg/dL). Liver Function Tests: Recent Labs  Lab 05/20/23 1602  AST 17  ALT 14  ALKPHOS 81  BILITOT 0.8  PROT 7.0  ALBUMIN 3.7   No results for  input(s): "LIPASE", "AMYLASE" in the last 168 hours. No results for input(s): "AMMONIA" in the last 168 hours. Coagulation Profile: Recent Labs  Lab 05/20/23 1602  INR 1.0   Cardiac Enzymes: No results for input(s): "CKTOTAL", "CKMB", "CKMBINDEX", "TROPONINI" in the last 168 hours. BNP (last 3 results) No results for input(s): "PROBNP" in the last 8760 hours. HbA1C: No results for input(s): "HGBA1C" in the last 72 hours. CBG: No results for input(s): "GLUCAP" in the last 168 hours. Lipid Profile: No results for input(s): "CHOL", "HDL", "LDLCALC", "TRIG", "CHOLHDL",  "LDLDIRECT" in the last 72 hours. Thyroid Function Tests: No results for input(s): "TSH", "T4TOTAL", "FREET4", "T3FREE", "THYROIDAB" in the last 72 hours. Anemia Panel: No results for input(s): "VITAMINB12", "FOLATE", "FERRITIN", "TIBC", "IRON", "RETICCTPCT" in the last 72 hours. Sepsis Labs: No results for input(s): "PROCALCITON", "LATICACIDVEN" in the last 168 hours.  No results found for this or any previous visit (from the past 240 hour(s)).       Radiology Studies: No results found.      Scheduled Meds:  vitamin B-12  1,000 mcg Oral Daily   cycloSPORINE  1 drop Both Eyes BID   diltiazem  180 mg Oral Daily   gabapentin  800 mg Oral TID   insulin aspart  0-5 Units Subcutaneous QHS   insulin aspart  0-9 Units Subcutaneous TID WC   insulin glargine-yfgn  5 Units Subcutaneous QHS   omega-3 acid ethyl esters  2 g Oral Daily   pantoprazole (PROTONIX) IV  40 mg Intravenous Q12H   traZODone  100 mg Oral QHS   Continuous Infusions:  lactated ringers Stopped (05/21/23 0712)     LOS: 1 day    Time spent: 35 minutes    Dorcas Carrow, MD [facility]

## 2023-05-21 NOTE — H&P (View-Only) (Signed)
[facility] Consult  Referring Provider: ER Primary Care Physician:  Verl Blalock Primary Gastroenterologist: Dr. Dulce Sellar  Reason for Consultation: Black stools  HPI: [name] is a 89 y.o. male who lives at home with his wife, came to the ER after his PCP advised him to proceed to the hospital for evaluation for several days of black stools.  Patient states that he developed liquid black stools, several episodes each day for 3 days and called his primary care physician.  His bowel movement yesterday was more brown and he has not had any bowel movements today.  He denies nausea or vomiting.  He denies noticing fresh blood in stools.  He has not had any abdominal pain or rectal pain.  He denies acid reflux or heartburn although he takes omeprazole at home.  He denies difficulty swallowing or pain on swallowing.  He denies loss of appetite or unintentional weight loss. He takes aspirin 81 mg at home.  Denies use of NSAIDs, Goody powders, other antiplatelets or anticoagulation. He has not had any shortness of breath, chest pain, dizziness, fatigue and loss of consciousness.  Previous GI workup: Colonoscopy 2011, Dr. Randa Evens, surveillance for history of polyps: Diverticulosis, internal hemorrhoids, repeat not recommended due to age Barium swallow 2015, dysphagia: Distal esophageal stricture EGD, Dr. Dulce Sellar, 2020: Unremarkable   Past Medical History:  Diagnosis Date   Anemia    related to frequent nosebleeds-right nostril.   Aortic insufficiency    a. mild by echo 2013.   Arthritis    arthritis -back knees, most joints   Atrial flutter (HCC)    a. per report in 2013.   Breathing difficulty 11-10-12   difficult to breath if laying posteriorly, right nare difficulty   Carotid artery disease (HCC)    a. Remote R CEA in the 1990s per pt.   Change in hearing    Diabetes mellitus age 4   Diabetic peripheral neuropathy associated with type 2 diabetes mellitus (HCC)  07/08/2022   Dysrhythmia 07/2012   hx. Atrial Flutter x1, converted spontaneously-no ploblems.    H/O hiatal hernia    HOH (hard of hearing) 11-10-12   bilaterally   Hyperlipidemia    Hypertension    Ulcer 1995   bleeding ulcer, none since    Past Surgical History:  Procedure Laterality Date   CAROTID DUPLEX  06/30/2012   PATENT RIGHT CAROTID ENDARTERECTOMY. 1%-39% LEFT ICA. STABLE   CAROTID ENDARTERECTOMY  11/242008   right with Dacron patch angioplasty   CATARACT EXTRACTION  1998 bilateral  done 3 months apart   CHOLECYSTECTOMY N/A 06/11/2019   Procedure: LAPAROSCOPIC CHOLECYSTECTOMY;  Surgeon: Harriette Bouillon, MD;  Location: [facility] OR;  Service: General;  Laterality: N/A;   ESOPHAGOGASTRODUODENOSCOPY (EGD) WITH PROPOFOL N/A 06/08/2019   Procedure: ESOPHAGOGASTRODUODENOSCOPY (EGD) WITH PROPOFOL;  Surgeon: Charlott Rakes, MD;  Location: [facility] ENDOSCOPY;  Service: Endoscopy;  Laterality: N/A;   EYE SURGERY     JOINT REPLACEMENT Right 11-18-12   KNEE BURSECTOMY  11/18/2012   Procedure: KNEE BURSECTOMY;  Surgeon: Jacki Cones, MD;  Location: [facility] ORS;  Service: Orthopedics;  Laterality: Right;   NM MYOCAR MULTIPLE W/SPECT  10/08/2012   NORMAL STRESS NUCLEAR STUDY.   TOTAL KNEE ARTHROPLASTY  11/18/2012   Procedure: TOTAL KNEE ARTHROPLASTY;  Surgeon: Jacki Cones, MD;  Location: [facility] ORS;  Service: Orthopedics;  Laterality: Right;   TRANSTHORACIC ECHOCARDIOGRAM  10/08/2012   MODERATE LVH. MODERATE CONCENTRIC HYPERTROPHY.EF 65 TO 70%. GRADE 1 DYSTOLIC DYSFUNCTION. ELEVATED VENTRICULAR  END-DIASTOLIC FILLING PRESSURE AND LEFT ATRIAL FILLING PRESSURE. AV- MILD TO MODERATE CALCIFIED ANNULUS. MV- CALCIFIC DEGENERATION.Marland Kitchen LA-MILDLY DILATED.    Prior to Admission medications   Medication Sig Start Date End Date Taking? Authorizing Provider  acetaminophen (TYLENOL) 500 MG tablet Take 500 mg by mouth every 6 (six) hours as needed for moderate pain (for pain).   Yes [provider]  aspirin EC  81 MG tablet Take 1 tablet (81 mg total) by mouth daily. 07/13/19  Yes Runell Gess, MD  atorvastatin (LIPITOR) 80 MG tablet Take 80 mg by mouth every evening.    Yes [provider]  cyanocobalamin 1000 MCG tablet Take 1 tablet (1,000 mcg total) by mouth daily. 02/23/23  Yes Shalhoub, Deno Lunger, MD  cycloSPORINE (RESTASIS) 0.05 % ophthalmic emulsion Place 1 drop into both eyes 2 (two) times daily.   Yes [provider]  Dextrose, Diabetic Use, (GLUCOSE PO) Take 1 tablet by mouth daily as needed (Low blood sugar).   Yes [provider]  diltiazem (CARDIZEM LA) 180 MG 24 hr tablet Take 180 mg by mouth daily. 04/29/22  Yes [provider]  empagliflozin (JARDIANCE) 25 MG TABS tablet Take 25 mg by mouth daily. 05/02/22  Yes [provider]  gabapentin (NEURONTIN) 800 MG tablet Take 800 mg by mouth 3 (three) times daily. 08/01/21  Yes [provider]  isosorbide mononitrate (IMDUR) 30 MG 24 hr tablet Take 1 tablet (30 mg total) by mouth daily. 05/02/23  Yes Runell Gess, MD  LANTUS SOLOSTAR 100 UNIT/ML Solostar Pen Inject 5-9 Units into the skin at bedtime. Sliding scale 03/24/23  Yes [provider]  lidocaine (LIDODERM) 5 % Place 1 patch onto the skin daily as needed (for pain- on 12 hours/off 12 hours). 08/07/21  Yes [provider]  losartan (COZAAR) 25 MG tablet Take 1 tablet (25 mg total) by mouth daily before breakfast. 01/22/22 05/20/23 Yes Dahal, Melina Schools, MD  Omega-3 1000 MG CAPS Take 2,000 mg by mouth daily.   Yes [provider]  omeprazole (PRILOSEC) 20 MG capsule Take 20 mg by mouth daily.   Yes [provider]  pioglitazone (ACTOS) 45 MG tablet Take 45 mg by mouth daily. 05/06/23  Yes [provider]  traZODone (DESYREL) 100 MG tablet Take 100 mg by mouth at bedtime.   Yes [provider]  furosemide (LASIX) 20 MG tablet Take 1 tablet (20 mg total) by mouth as needed (Weigh yourself  daily.  If weight increases by more than 2 pounds from day before or 5 pounds compared to prior week take one dose.). Patient not taking: Reported on 05/02/2023 02/22/23 02/22/24  Marinda Elk, MD  insulin detemir (LEVEMIR) 100 UNIT/ML injection Inject 0.05-0.1 mLs (5-10 Units total) into the skin at bedtime. Patient not taking: Reported on 05/02/2023 02/22/23   Marinda Elk, MD  mupirocin cream (BACTROBAN) 2 % Apply 1 Application topically 2 (two) times daily. Patient not taking: Reported on 05/02/2023 12/14/22   Palumbo, April, MD  nitroGLYCERIN (NITROSTAT) 0.3 MG SL tablet Place 0.3 mg under the tongue every 5 (five) minutes as needed for chest pain. Patient not taking: Reported on 05/02/2023 07/31/21   [provider]    Current Facility-Administered Medications  Medication Dose Route Frequency Provider Last Rate Last Admin   acetaminophen (TYLENOL) tablet 650 mg  650 mg Oral Q6H PRN Buena Irish, MD       Or   acetaminophen (TYLENOL) suppository 650 mg  650  mg Rectal Q6H PRN Buena Irish, MD       lactated ringers infusion   Intravenous Continuous Buena Irish, MD   Held at 05/21/23 8295   ondansetron [facility]) tablet 4 mg  4 mg Oral Q6H PRN Buena Irish, MD       Or   ondansetron [facility]) injection 4 mg  4 mg Intravenous Q6H PRN Buena Irish, MD       pantoprazole (PROTONIX) injection 40 mg  40 mg Intravenous Q12H Buena Irish, MD   40 mg at 05/21/23 1000   Current Outpatient Medications  Medication Sig Dispense Refill   acetaminophen (TYLENOL) 500 MG tablet Take 500 mg by mouth every 6 (six) hours as needed for moderate pain (for pain).     aspirin EC 81 MG tablet Take 1 tablet (81 mg total) by mouth daily. 90 tablet 3   atorvastatin (LIPITOR) 80 MG tablet Take 80 mg by mouth every evening.      cyanocobalamin 1000 MCG tablet Take 1 tablet (1,000 mcg total) by mouth daily. 30 tablet 2   cycloSPORINE (RESTASIS) 0.05 % ophthalmic emulsion  Place 1 drop into both eyes 2 (two) times daily.     Dextrose, Diabetic Use, (GLUCOSE PO) Take 1 tablet by mouth daily as needed (Low blood sugar).     diltiazem (CARDIZEM LA) 180 MG 24 hr tablet Take 180 mg by mouth daily.     empagliflozin (JARDIANCE) 25 MG TABS tablet Take 25 mg by mouth daily.     gabapentin (NEURONTIN) 800 MG tablet Take 800 mg by mouth 3 (three) times daily.     isosorbide mononitrate (IMDUR) 30 MG 24 hr tablet Take 1 tablet (30 mg total) by mouth daily. 90 tablet 3   LANTUS SOLOSTAR 100 UNIT/ML Solostar Pen Inject 5-9 Units into the skin at bedtime. Sliding scale     lidocaine (LIDODERM) 5 % Place 1 patch onto the skin daily as needed (for pain- on 12 hours/off 12 hours).     losartan (COZAAR) 25 MG tablet Take 1 tablet (25 mg total) by mouth daily before breakfast. 30 tablet 2   Omega-3 1000 MG CAPS Take 2,000 mg by mouth daily.     omeprazole (PRILOSEC) 20 MG capsule Take 20 mg by mouth daily.     pioglitazone (ACTOS) 45 MG tablet Take 45 mg by mouth daily.     traZODone (DESYREL) 100 MG tablet Take 100 mg by mouth at bedtime.     furosemide (LASIX) 20 MG tablet Take 1 tablet (20 mg total) by mouth as needed (Weigh yourself daily.  If weight increases by more than 2 pounds from day before or 5 pounds compared to prior week take one dose.). (Patient not taking: Reported on 05/02/2023) 30 tablet 1   insulin detemir (LEVEMIR) 100 UNIT/ML injection Inject 0.05-0.1 mLs (5-10 Units total) into the skin at bedtime. (Patient not taking: Reported on 05/02/2023) 10 mL 11   mupirocin cream (BACTROBAN) 2 % Apply 1 Application topically 2 (two) times daily. (Patient not taking: Reported on 05/02/2023) 45 each 0   nitroGLYCERIN (NITROSTAT) 0.3 MG SL tablet Place 0.3 mg under the tongue every 5 (five) minutes as needed for chest pain. (Patient not taking: Reported on 05/02/2023)      Allergies as of 05/20/2023 - Review Complete 05/20/2023  Allergen Reaction Noted   Ciprofloxacin Other  (See Comments) 02/22/2023    Family History  Problem Relation Age of Onset   Cancer Mother  Pancreatic   Heart disease Mother    Cancer Brother        pancreactic   Cancer Daughter        Breast    Social History   Socioeconomic History   Marital status: Married    Spouse name: Not on file   Number of children: Not on file   Years of education: Not on file   Highest education level: Not on file  Occupational History   Occupation: retired  Tobacco Use   Smoking status: Former    Current packs/day: 0.00    Types: Cigarettes    Quit date: 10/21/1976    Years since quitting: 46.6    Passive exposure: Past   Smokeless tobacco: Never  Vaping Use   Vaping status: Never Used  Substance and Sexual Activity   Alcohol use: Yes    Alcohol/week: 1.0 standard drink of alcohol    Types: 1 Glasses of wine per week    Comment: 1-2 alcoholic drinks daily   Drug use: No   Sexual activity: Not on file  Other Topics Concern   Not on file  Social History Narrative   Not on file   Social Determinants of Health   Financial Resource Strain: Not on file  Food Insecurity: No Food Insecurity (02/21/2023)   Hunger Vital Sign    Worried About Running Out of Food in the Last Year: Never true    Ran Out of Food in the Last Year: Never true  Transportation Needs: No Transportation Needs (02/21/2023)   PRAPARE - Administrator, Civil Service (Medical): No    Lack of Transportation (Non-Medical): No  Physical Activity: Not on file  Stress: Not on file  Social Connections: Not on file  Intimate Partner Violence: Not At Risk (02/21/2023)   Humiliation, Afraid, Rape, and Kick questionnaire    Fear of Current or Ex-Partner: No    Emotionally Abused: No    Physically Abused: No    Sexually Abused: No    Review of Systems: As per HPI Physical Exam: Vital signs in last 24 hours: Temp:  [97.8 F (36.6 C)-98.6 F (37 C)] 98.6 F (37 C) (07/31 0900) Pulse Rate:  [45-82] 57  (07/31 0900) Resp:  [12-19] 12 (07/31 0900) BP: (144-179)/(51-78) 159/61 (07/31 0900) SpO2:  [94 %-100 %] 96 % (07/31 0900) Weight:  [79.6 kg-83 kg] 79.6 kg (07/30 2215)    General:   Elderly, hard of hearing  Head:  Normocephalic and atraumatic. Eyes:  Sclera clear, no icterus.   Mild pallor Ears:  Normal auditory acuity. Nose:  No deformity, discharge,  or lesions. Mouth:  No deformity or lesions.  Oropharynx pink & moist. Neck:  Supple; no masses or thyromegaly. Lungs:  Clear throughout to auscultation.   No wheezes, crackles, or rhonchi. No acute distress. Heart:  Regular rate and rhythm; no murmurs, clicks, rubs,  or gallops. Extremities: Chronic skin changes of lower extremities, tender to touch bilaterally. Neurologic:  Alert and  oriented x4;  grossly normal neurologically. Skin:  Intact without significant lesions or rashes. Psych:  Alert and cooperative. Normal mood and affect. Abdomen:  Soft, nontender and nondistended. No masses, hepatosplenomegaly or hernias noted. Normal bowel sounds, without guarding, and without rebound.       Lab Results: Recent Labs    05/20/23 1602 05/21/23 0614  WBC 8.2 6.1  HGB 10.3* 9.1*  HCT 33.2* 29.2*  PLT 185 175   BMET Recent Labs  05/20/23 1602 05/21/23 0614  NA 137 140  K 4.4 3.9  CL 107 108  CO2 23 23  GLUCOSE 230* 148*  BUN 39* 30*  CREATININE 1.40* 1.18  CALCIUM 9.1 9.0   LFT Recent Labs    05/20/23 1602  PROT 7.0  ALBUMIN 3.7  AST 17  ALT 14  ALKPHOS 81  BILITOT 0.8   PT/INR Recent Labs    05/20/23 1602  LABPROT 13.8  INR 1.0    Studies/Results: No results found.  Impression: Black stools, elevated BUN/creatinine ratio, hemoglobin 9.1, aspirin use, suspicious for upper GI bleed  He is hemodynamically stable, not hypotensive or tachycardic  Plan: Will start him on clear liquid diet and keep him n.p.o. postmidnight with the intention to perform an endoscopic evaluation tomorrow. Patient has  been started on Protonix 40 mg every 12 hours. We will monitor H&H and if hemoglobin is less than 7, plan PRBC transfusion. Continue supportive management.   LOS: 1 day   Kerin Salen, MD  05/21/2023, 10:55 AM

## 2023-05-21 NOTE — ED Notes (Signed)
Call from patient daughter, updated on POC. Verbalized understanding. No further needs

## 2023-05-21 NOTE — Plan of Care (Signed)
  Problem: Education: Goal: Knowledge of General Education information will improve Description: Including pain rating scale, medication(s)/side effects and non-pharmacologic comfort measures Outcome: Completed/Met

## 2023-05-21 NOTE — ED Notes (Signed)
GI MD at bedside

## 2023-05-21 NOTE — ED Notes (Signed)
ED TO INPATIENT HANDOFF REPORT  Name/Age/Gender [name] 89 y.o. male  Code Status    Code Status Orders  (From admission, onward)           Start     Ordered   05/20/23 2057  Full code  Continuous       Question:  By:  Answer:  Consent: discussion documented in EHR   05/20/23 2100           Code Status History     Date Active Date Inactive Code Status Order ID Comments User Context   02/20/2023 1519 02/23/2023 0026 DNR [id number]  Fran Lowes, DO ED   01/20/2022 1837 01/22/2022 2047 DNR [id number]  Orland Mustard, MD Inpatient   06/06/2019 0859 06/15/2019 1748 DNR [id number]  Jonah Blue, MD ED   11/18/2012 1339 11/21/2012 1459 Full Code [id number]  Theone Murdoch, RN Inpatient       Home/SNF/Other Home  Chief Complaint GI bleed [K92.2]  Level of Care/Admitting Diagnosis ED Disposition     ED Disposition  Admit   Condition  --   Comment  Hospital Area: [facility] [[id number]]  Level of Care: Progressive [102]  Admit to Progressive based on following criteria: GI, ENDOCRINE disease patients with GI bleeding, acute liver failure or pancreatitis, stable with diabetic ketoacidosis or thyrotoxicosis (hypothyroid) state.  May admit patient to [facility] or [facility] if equivalent level of care is available:: Yes  Covid Evaluation: Asymptomatic - no recent exposure (last 10 days) testing not required  Diagnosis: GI bleed [962952]  Admitting Physician: Buena Irish [[id number]]  Attending Physician: Buena Irish [phone]  Certification:: I certify this patient will need inpatient services for at least 2 midnights  Estimated Length of Stay: 2          Medical History Past Medical History:  Diagnosis Date   Anemia    related to frequent nosebleeds-right nostril.   Aortic insufficiency    a. mild by echo 2013.   Arthritis    arthritis -back knees, most joints   Atrial flutter (HCC)    a. per report in 2013.   Breathing difficulty  11-10-12   difficult to breath if laying posteriorly, right nare difficulty   Carotid artery disease (HCC)    a. Remote R CEA in the 1990s per pt.   Change in hearing    Diabetes mellitus age 34   Diabetic peripheral neuropathy associated with type 2 diabetes mellitus (HCC) 07/08/2022   Dysrhythmia 07/2012   hx. Atrial Flutter x1, converted spontaneously-no ploblems.    H/O hiatal hernia    HOH (hard of hearing) 11-10-12   bilaterally   Hyperlipidemia    Hypertension    Ulcer 1995   bleeding ulcer, none since    Allergies Allergies  Allergen Reactions   Ciprofloxacin Other (See Comments)    Confusion    IV Location/Drains/Wounds Patient Lines/Drains/Airways Status     Active Line/Drains/Airways     Name Placement date Placement time Site Days   Peripheral IV 05/20/23 20 G 1" Left;Posterior Forearm 05/20/23  2157  Forearm  1            Labs/Imaging Results for orders placed or performed during the hospital encounter of 05/20/23 (from the past 48 hour(s))  Comprehensive metabolic panel     Status: Abnormal   Collection Time: 05/20/23  4:02 PM  Result Value Ref Range   Sodium 137 135 - 145 mmol/L   Potassium 4.4 3.5 -  5.1 mmol/L   Chloride 107 98 - 111 mmol/L   CO2 23 22 - 32 mmol/L   Glucose, Bld 230 (H) 70 - 99 mg/dL    Comment: Glucose reference range applies only to samples taken after fasting for at least 8 hours.   BUN 39 (H) 8 - 23 mg/dL   Creatinine, Ser 1.61 (H) 0.61 - 1.24 mg/dL   Calcium 9.1 8.9 - 09.6 mg/dL   Total Protein 7.0 6.5 - 8.1 g/dL   Albumin 3.7 3.5 - 5.0 g/dL   AST 17 15 - 41 U/L   ALT 14 0 - 44 U/L   Alkaline Phosphatase 81 38 - 126 U/L   Total Bilirubin 0.8 0.3 - 1.2 mg/dL   GFR, Estimated 47 (L) >60 mL/min    Comment: (NOTE) Calculated using the CKD-EPI Creatinine Equation (2021)    Anion gap 7 5 - 15    Comment: Performed at [facility], [address] [place], [place] [address]  CBC with Differential      Status: Abnormal   Collection Time: 05/20/23  4:02 PM  Result Value Ref Range   WBC 8.2 4.0 - 10.5 K/uL   RBC 3.55 (L) 4.22 - 5.81 MIL/uL   Hemoglobin 10.3 (L) 13.0 - 17.0 g/dL   HCT 98.1 (L) 19.1 - 47.8 %   MCV 93.5 80.0 - 100.0 fL   MCH 29.0 26.0 - 34.0 pg   MCHC 31.0 30.0 - 36.0 g/dL   RDW 29.5 (H) 62.1 - 30.8 %   Platelets 185 150 - 400 K/uL   nRBC 0.0 0.0 - 0.2 %   Neutrophils Relative % 70 %   Neutro Abs 5.8 1.7 - 7.7 K/uL   Lymphocytes Relative 15 %   Lymphs Abs 1.2 0.7 - 4.0 K/uL   Monocytes Relative 10 %   Monocytes Absolute 0.8 0.1 - 1.0 K/uL   Eosinophils Relative 4 %   Eosinophils Absolute 0.3 0.0 - 0.5 K/uL   Basophils Relative 1 %   Basophils Absolute 0.0 0.0 - 0.1 K/uL   Immature Granulocytes 0 %   Abs Immature Granulocytes 0.03 0.00 - 0.07 K/uL    Comment: Performed at [facility], [address] [place], [place] [address]  Protime-INR     Status: None   Collection Time: 05/20/23  4:02 PM  Result Value Ref Range   Prothrombin Time 13.8 11.4 - 15.2 seconds   INR 1.0 0.8 - 1.2    Comment: (NOTE) INR goal varies based on device and disease states. Performed at [facility], [address] [place], [place] [address]   Type and screen [facility]     Status: None   Collection Time: 05/20/23  4:08 PM  Result Value Ref Range   ABO/RH(D) A POS    Antibody Screen NEG    Sample Expiration      05/23/2023,2359 Performed at [facility], [address], [place], [place] [address]   Magnesium     Status: None   Collection Time: 05/21/23  6:14 AM  Result Value Ref Range   Magnesium 2.2 1.7 - 2.4 mg/dL    Comment: Performed at [facility], [address] [place], [place] [address]  Brain natriuretic peptide     Status: Abnormal   Collection Time: 05/21/23  6:14 AM  Result Value Ref Range   B Natriuretic Peptide 729.0 (H) 0.0 - 100.0 pg/mL    Comment:  Performed at  [facility], [address] [place], [place] [address]  Basic metabolic panel     Status: Abnormal   Collection Time: 05/21/23  6:14 AM  Result Value Ref Range   Sodium 140 135 - 145 mmol/L   Potassium 3.9 3.5 - 5.1 mmol/L   Chloride 108 98 - 111 mmol/L   CO2 23 22 - 32 mmol/L   Glucose, Bld 148 (H) 70 - 99 mg/dL    Comment: Glucose reference range applies only to samples taken after fasting for at least 8 hours.   BUN 30 (H) 8 - 23 mg/dL   Creatinine, Ser 6.04 0.61 - 1.24 mg/dL   Calcium 9.0 8.9 - 54.0 mg/dL   GFR, Estimated 58 (L) >60 mL/min    Comment: (NOTE) Calculated using the CKD-EPI Creatinine Equation (2021)    Anion gap 9 5 - 15    Comment: Performed at [facility], [address] [place], [place] [address]  CBC     Status: Abnormal   Collection Time: 05/21/23  6:14 AM  Result Value Ref Range   WBC 6.1 4.0 - 10.5 K/uL   RBC 3.14 (L) 4.22 - 5.81 MIL/uL   Hemoglobin 9.1 (L) 13.0 - 17.0 g/dL   HCT 14.7 (L) 82.9 - 56.2 %   MCV 93.0 80.0 - 100.0 fL   MCH 29.0 26.0 - 34.0 pg   MCHC 31.2 30.0 - 36.0 g/dL   RDW 13.0 (H) 86.5 - 78.4 %   Platelets 175 150 - 400 K/uL   nRBC 0.0 0.0 - 0.2 %    Comment: Performed at [facility], [address] [place], [place] [address]   No results found.  Pending Labs Unresulted Labs (From admission, onward)    None       Vitals/Pain Today's Vitals   05/21/23 1207 05/21/23 1300 05/21/23 1400 05/21/23 1415  BP: (!) 134/110 (!) 144/62 (!) 148/60   Pulse: 64 71 (!) 50 68  Resp: 16 15 13 17   Temp: 98.2 F (36.8 C)     TempSrc: Oral     SpO2: 98% 98% 98% 100%  Weight:      Height:      PainSc:        Isolation Precautions No active isolations  Medications Medications  lactated ringers infusion (0 mLs Intravenous Hold 05/21/23 0712)  ondansetron (ZOFRAN) tablet 4 mg (has no administration in time range)    Or  ondansetron (ZOFRAN) injection 4 mg (has no  administration in time range)  pantoprazole (PROTONIX) injection 40 mg (40 mg Intravenous Given 05/21/23 1000)  insulin aspart (novoLOG) injection 0-9 Units (has no administration in time range)  insulin aspart (novoLOG) injection 0-5 Units (has no administration in time range)  insulin glargine-yfgn (SEMGLEE) injection 5 Units (has no administration in time range)  acetaminophen (TYLENOL) tablet 500 mg (has no administration in time range)  traZODone (DESYREL) tablet 100 mg (has no administration in time range)  cycloSPORINE (RESTASIS) 0.05 % ophthalmic emulsion 1 drop (has no administration in time range)  diltiazem (CARDIZEM CD) 24 hr capsule 180 mg (has no administration in time range)  cyanocobalamin (VITAMIN B12) tablet 1,000 mcg (has no administration in time range)  omega-3 acid ethyl esters (LOVAZA) capsule 2 g (has no administration in time range)  gabapentin (NEURONTIN) capsule 800 mg (has no administration in time range)  pantoprazole (PROTONIX) injection 40 mg (40 mg Intravenous Given 05/20/23 2200)    Mobility  walks

## 2023-05-21 NOTE — ED Notes (Signed)
At bedside with patient, verbalized he is upset. Wants a bed upstairs. Advised he is admitted and waiting for bed upstairs. Anything else to help promote comfort, refused.

## 2023-05-21 NOTE — Anesthesia Preprocedure Evaluation (Addendum)
Anesthesia Evaluation  Patient identified by MRN, date of birth, ID band Patient awake    Reviewed: Allergy & Precautions, NPO status , Patient's Chart, lab work & pertinent test results  Airway Mallampati: II  TM Distance: >3 FB Neck ROM: Full    Dental  (+) Dental Advisory Given, Edentulous Upper, Missing   Pulmonary former smoker   Pulmonary exam normal breath sounds clear to auscultation       Cardiovascular hypertension, Pt. on medications + CAD and + Peripheral Vascular Disease  + dysrhythmias Atrial Fibrillation + Valvular Problems/Murmurs AI  Rhythm:Irregular Rate:Bradycardia     Neuro/Psych  Neuromuscular disease    GI/Hepatic Neg liver ROS, hiatal hernia,,,  Endo/Other  diabetes, Type 2    Renal/GU negative Renal ROS     Musculoskeletal  (+) Arthritis ,    Abdominal   Peds  Hematology  (+) Blood dyscrasia, anemia   Anesthesia Other Findings   Reproductive/Obstetrics                             Anesthesia Physical Anesthesia Plan  ASA: 3  Anesthesia Plan: MAC   Post-op Pain Management:    Induction: Intravenous  PONV Risk Score and Plan: 1 and TIVA and Treatment may vary due to age or medical condition  Airway Management Planned: Natural Airway and Simple Face Mask  Additional Equipment:   Intra-op Plan:   Post-operative Plan:   Informed Consent: I have reviewed the patients History and Physical, chart, labs and discussed the procedure including the risks, benefits and alternatives for the proposed anesthesia with the patient or authorized representative who has indicated his/her understanding and acceptance.     Dental advisory given  Plan Discussed with: CRNA and Anesthesiologist  Anesthesia Plan Comments:        Anesthesia Quick Evaluation

## 2023-05-21 NOTE — ED Notes (Signed)
Pt placed in hospital bed by tech for comfort.

## 2023-05-21 NOTE — Consult Note (Signed)
[facility] Consult  Referring Provider: ER Primary Care Physician:  Verl Blalock Primary Gastroenterologist: Dr. Dulce Sellar  Reason for Consultation: Black stools  HPI: [name] is a 89 y.o. male who lives at home with his wife, came to the ER after his PCP advised him to proceed to the hospital for evaluation for several days of black stools.  Patient states that he developed liquid black stools, several episodes each day for 3 days and called his primary care physician.  His bowel movement yesterday was more brown and he has not had any bowel movements today.  He denies nausea or vomiting.  He denies noticing fresh blood in stools.  He has not had any abdominal pain or rectal pain.  He denies acid reflux or heartburn although he takes omeprazole at home.  He denies difficulty swallowing or pain on swallowing.  He denies loss of appetite or unintentional weight loss. He takes aspirin 81 mg at home.  Denies use of NSAIDs, Goody powders, other antiplatelets or anticoagulation. He has not had any shortness of breath, chest pain, dizziness, fatigue and loss of consciousness.  Previous GI workup: Colonoscopy 2011, Dr. Randa Evens, surveillance for history of polyps: Diverticulosis, internal hemorrhoids, repeat not recommended due to age Barium swallow 2015, dysphagia: Distal esophageal stricture EGD, Dr. Dulce Sellar, 2020: Unremarkable   Past Medical History:  Diagnosis Date   Anemia    related to frequent nosebleeds-right nostril.   Aortic insufficiency    a. mild by echo 2013.   Arthritis    arthritis -back knees, most joints   Atrial flutter (HCC)    a. per report in 2013.   Breathing difficulty 11-10-12   difficult to breath if laying posteriorly, right nare difficulty   Carotid artery disease (HCC)    a. Remote R CEA in the 1990s per pt.   Change in hearing    Diabetes mellitus age 4   Diabetic peripheral neuropathy associated with type 2 diabetes mellitus (HCC)  07/08/2022   Dysrhythmia 07/2012   hx. Atrial Flutter x1, converted spontaneously-no ploblems.    H/O hiatal hernia    HOH (hard of hearing) 11-10-12   bilaterally   Hyperlipidemia    Hypertension    Ulcer 1995   bleeding ulcer, none since    Past Surgical History:  Procedure Laterality Date   CAROTID DUPLEX  06/30/2012   PATENT RIGHT CAROTID ENDARTERECTOMY. 1%-39% LEFT ICA. STABLE   CAROTID ENDARTERECTOMY  11/242008   right with Dacron patch angioplasty   CATARACT EXTRACTION  1998 bilateral  done 3 months apart   CHOLECYSTECTOMY N/A 06/11/2019   Procedure: LAPAROSCOPIC CHOLECYSTECTOMY;  Surgeon: Harriette Bouillon, MD;  Location: [facility] OR;  Service: General;  Laterality: N/A;   ESOPHAGOGASTRODUODENOSCOPY (EGD) WITH PROPOFOL N/A 06/08/2019   Procedure: ESOPHAGOGASTRODUODENOSCOPY (EGD) WITH PROPOFOL;  Surgeon: Charlott Rakes, MD;  Location: [facility] ENDOSCOPY;  Service: Endoscopy;  Laterality: N/A;   EYE SURGERY     JOINT REPLACEMENT Right 11-18-12   KNEE BURSECTOMY  11/18/2012   Procedure: KNEE BURSECTOMY;  Surgeon: Jacki Cones, MD;  Location: [facility] ORS;  Service: Orthopedics;  Laterality: Right;   NM MYOCAR MULTIPLE W/SPECT  10/08/2012   NORMAL STRESS NUCLEAR STUDY.   TOTAL KNEE ARTHROPLASTY  11/18/2012   Procedure: TOTAL KNEE ARTHROPLASTY;  Surgeon: Jacki Cones, MD;  Location: [facility] ORS;  Service: Orthopedics;  Laterality: Right;   TRANSTHORACIC ECHOCARDIOGRAM  10/08/2012   MODERATE LVH. MODERATE CONCENTRIC HYPERTROPHY.EF 65 TO 70%. GRADE 1 DYSTOLIC DYSFUNCTION. ELEVATED VENTRICULAR  END-DIASTOLIC FILLING PRESSURE AND LEFT ATRIAL FILLING PRESSURE. AV- MILD TO MODERATE CALCIFIED ANNULUS. MV- CALCIFIC DEGENERATION.Marland Kitchen LA-MILDLY DILATED.    Prior to Admission medications   Medication Sig Start Date End Date Taking? Authorizing Provider  acetaminophen (TYLENOL) 500 MG tablet Take 500 mg by mouth every 6 (six) hours as needed for moderate pain (for pain).   Yes [provider]  aspirin EC  81 MG tablet Take 1 tablet (81 mg total) by mouth daily. 07/13/19  Yes Runell Gess, MD  atorvastatin (LIPITOR) 80 MG tablet Take 80 mg by mouth every evening.    Yes [provider]  cyanocobalamin 1000 MCG tablet Take 1 tablet (1,000 mcg total) by mouth daily. 02/23/23  Yes Shalhoub, Deno Lunger, MD  cycloSPORINE (RESTASIS) 0.05 % ophthalmic emulsion Place 1 drop into both eyes 2 (two) times daily.   Yes [provider]  Dextrose, Diabetic Use, (GLUCOSE PO) Take 1 tablet by mouth daily as needed (Low blood sugar).   Yes [provider]  diltiazem (CARDIZEM LA) 180 MG 24 hr tablet Take 180 mg by mouth daily. 04/29/22  Yes [provider]  empagliflozin (JARDIANCE) 25 MG TABS tablet Take 25 mg by mouth daily. 05/02/22  Yes [provider]  gabapentin (NEURONTIN) 800 MG tablet Take 800 mg by mouth 3 (three) times daily. 08/01/21  Yes [provider]  isosorbide mononitrate (IMDUR) 30 MG 24 hr tablet Take 1 tablet (30 mg total) by mouth daily. 05/02/23  Yes Runell Gess, MD  LANTUS SOLOSTAR 100 UNIT/ML Solostar Pen Inject 5-9 Units into the skin at bedtime. Sliding scale 03/24/23  Yes [provider]  lidocaine (LIDODERM) 5 % Place 1 patch onto the skin daily as needed (for pain- on 12 hours/off 12 hours). 08/07/21  Yes [provider]  losartan (COZAAR) 25 MG tablet Take 1 tablet (25 mg total) by mouth daily before breakfast. 01/22/22 05/20/23 Yes Dahal, Melina Schools, MD  Omega-3 1000 MG CAPS Take 2,000 mg by mouth daily.   Yes [provider]  omeprazole (PRILOSEC) 20 MG capsule Take 20 mg by mouth daily.   Yes [provider]  pioglitazone (ACTOS) 45 MG tablet Take 45 mg by mouth daily. 05/06/23  Yes [provider]  traZODone (DESYREL) 100 MG tablet Take 100 mg by mouth at bedtime.   Yes [provider]  furosemide (LASIX) 20 MG tablet Take 1 tablet (20 mg total) by mouth as needed (Weigh yourself  daily.  If weight increases by more than 2 pounds from day before or 5 pounds compared to prior week take one dose.). Patient not taking: Reported on 05/02/2023 02/22/23 02/22/24  Marinda Elk, MD  insulin detemir (LEVEMIR) 100 UNIT/ML injection Inject 0.05-0.1 mLs (5-10 Units total) into the skin at bedtime. Patient not taking: Reported on 05/02/2023 02/22/23   Marinda Elk, MD  mupirocin cream (BACTROBAN) 2 % Apply 1 Application topically 2 (two) times daily. Patient not taking: Reported on 05/02/2023 12/14/22   Palumbo, April, MD  nitroGLYCERIN (NITROSTAT) 0.3 MG SL tablet Place 0.3 mg under the tongue every 5 (five) minutes as needed for chest pain. Patient not taking: Reported on 05/02/2023 07/31/21   [provider]    Current Facility-Administered Medications  Medication Dose Route Frequency Provider Last Rate Last Admin   acetaminophen (TYLENOL) tablet 650 mg  650 mg Oral Q6H PRN Buena Irish, MD       Or   acetaminophen (TYLENOL) suppository 650 mg  650  mg Rectal Q6H PRN Buena Irish, MD       lactated ringers infusion   Intravenous Continuous Buena Irish, MD   Held at 05/21/23 8295   ondansetron [facility]) tablet 4 mg  4 mg Oral Q6H PRN Buena Irish, MD       Or   ondansetron [facility]) injection 4 mg  4 mg Intravenous Q6H PRN Buena Irish, MD       pantoprazole (PROTONIX) injection 40 mg  40 mg Intravenous Q12H Buena Irish, MD   40 mg at 05/21/23 1000   Current Outpatient Medications  Medication Sig Dispense Refill   acetaminophen (TYLENOL) 500 MG tablet Take 500 mg by mouth every 6 (six) hours as needed for moderate pain (for pain).     aspirin EC 81 MG tablet Take 1 tablet (81 mg total) by mouth daily. 90 tablet 3   atorvastatin (LIPITOR) 80 MG tablet Take 80 mg by mouth every evening.      cyanocobalamin 1000 MCG tablet Take 1 tablet (1,000 mcg total) by mouth daily. 30 tablet 2   cycloSPORINE (RESTASIS) 0.05 % ophthalmic emulsion  Place 1 drop into both eyes 2 (two) times daily.     Dextrose, Diabetic Use, (GLUCOSE PO) Take 1 tablet by mouth daily as needed (Low blood sugar).     diltiazem (CARDIZEM LA) 180 MG 24 hr tablet Take 180 mg by mouth daily.     empagliflozin (JARDIANCE) 25 MG TABS tablet Take 25 mg by mouth daily.     gabapentin (NEURONTIN) 800 MG tablet Take 800 mg by mouth 3 (three) times daily.     isosorbide mononitrate (IMDUR) 30 MG 24 hr tablet Take 1 tablet (30 mg total) by mouth daily. 90 tablet 3   LANTUS SOLOSTAR 100 UNIT/ML Solostar Pen Inject 5-9 Units into the skin at bedtime. Sliding scale     lidocaine (LIDODERM) 5 % Place 1 patch onto the skin daily as needed (for pain- on 12 hours/off 12 hours).     losartan (COZAAR) 25 MG tablet Take 1 tablet (25 mg total) by mouth daily before breakfast. 30 tablet 2   Omega-3 1000 MG CAPS Take 2,000 mg by mouth daily.     omeprazole (PRILOSEC) 20 MG capsule Take 20 mg by mouth daily.     pioglitazone (ACTOS) 45 MG tablet Take 45 mg by mouth daily.     traZODone (DESYREL) 100 MG tablet Take 100 mg by mouth at bedtime.     furosemide (LASIX) 20 MG tablet Take 1 tablet (20 mg total) by mouth as needed (Weigh yourself daily.  If weight increases by more than 2 pounds from day before or 5 pounds compared to prior week take one dose.). (Patient not taking: Reported on 05/02/2023) 30 tablet 1   insulin detemir (LEVEMIR) 100 UNIT/ML injection Inject 0.05-0.1 mLs (5-10 Units total) into the skin at bedtime. (Patient not taking: Reported on 05/02/2023) 10 mL 11   mupirocin cream (BACTROBAN) 2 % Apply 1 Application topically 2 (two) times daily. (Patient not taking: Reported on 05/02/2023) 45 each 0   nitroGLYCERIN (NITROSTAT) 0.3 MG SL tablet Place 0.3 mg under the tongue every 5 (five) minutes as needed for chest pain. (Patient not taking: Reported on 05/02/2023)      Allergies as of 05/20/2023 - Review Complete 05/20/2023  Allergen Reaction Noted   Ciprofloxacin Other  (See Comments) 02/22/2023    Family History  Problem Relation Age of Onset   Cancer Mother  Pancreatic   Heart disease Mother    Cancer Brother        pancreactic   Cancer Daughter        Breast    Social History   Socioeconomic History   Marital status: Married    Spouse name: Not on file   Number of children: Not on file   Years of education: Not on file   Highest education level: Not on file  Occupational History   Occupation: retired  Tobacco Use   Smoking status: Former    Current packs/day: 0.00    Types: Cigarettes    Quit date: 10/21/1976    Years since quitting: 46.6    Passive exposure: Past   Smokeless tobacco: Never  Vaping Use   Vaping status: Never Used  Substance and Sexual Activity   Alcohol use: Yes    Alcohol/week: 1.0 standard drink of alcohol    Types: 1 Glasses of wine per week    Comment: 1-2 alcoholic drinks daily   Drug use: No   Sexual activity: Not on file  Other Topics Concern   Not on file  Social History Narrative   Not on file   Social Determinants of Health   Financial Resource Strain: Not on file  Food Insecurity: No Food Insecurity (02/21/2023)   Hunger Vital Sign    Worried About Running Out of Food in the Last Year: Never true    Ran Out of Food in the Last Year: Never true  Transportation Needs: No Transportation Needs (02/21/2023)   PRAPARE - Administrator, Civil Service (Medical): No    Lack of Transportation (Non-Medical): No  Physical Activity: Not on file  Stress: Not on file  Social Connections: Not on file  Intimate Partner Violence: Not At Risk (02/21/2023)   Humiliation, Afraid, Rape, and Kick questionnaire    Fear of Current or Ex-Partner: No    Emotionally Abused: No    Physically Abused: No    Sexually Abused: No    Review of Systems: As per HPI Physical Exam: Vital signs in last 24 hours: Temp:  [97.8 F (36.6 C)-98.6 F (37 C)] 98.6 F (37 C) (07/31 0900) Pulse Rate:  [45-82] 57  (07/31 0900) Resp:  [12-19] 12 (07/31 0900) BP: (144-179)/(51-78) 159/61 (07/31 0900) SpO2:  [94 %-100 %] 96 % (07/31 0900) Weight:  [79.6 kg-83 kg] 79.6 kg (07/30 2215)    General:   Elderly, hard of hearing  Head:  Normocephalic and atraumatic. Eyes:  Sclera clear, no icterus.   Mild pallor Ears:  Normal auditory acuity. Nose:  No deformity, discharge,  or lesions. Mouth:  No deformity or lesions.  Oropharynx pink & moist. Neck:  Supple; no masses or thyromegaly. Lungs:  Clear throughout to auscultation.   No wheezes, crackles, or rhonchi. No acute distress. Heart:  Regular rate and rhythm; no murmurs, clicks, rubs,  or gallops. Extremities: Chronic skin changes of lower extremities, tender to touch bilaterally. Neurologic:  Alert and  oriented x4;  grossly normal neurologically. Skin:  Intact without significant lesions or rashes. Psych:  Alert and cooperative. Normal mood and affect. Abdomen:  Soft, nontender and nondistended. No masses, hepatosplenomegaly or hernias noted. Normal bowel sounds, without guarding, and without rebound.       Lab Results: Recent Labs    05/20/23 1602 05/21/23 0614  WBC 8.2 6.1  HGB 10.3* 9.1*  HCT 33.2* 29.2*  PLT 185 175   BMET Recent Labs  05/20/23 1602 05/21/23 0614  NA 137 140  K 4.4 3.9  CL 107 108  CO2 23 23  GLUCOSE 230* 148*  BUN 39* 30*  CREATININE 1.40* 1.18  CALCIUM 9.1 9.0   LFT Recent Labs    05/20/23 1602  PROT 7.0  ALBUMIN 3.7  AST 17  ALT 14  ALKPHOS 81  BILITOT 0.8   PT/INR Recent Labs    05/20/23 1602  LABPROT 13.8  INR 1.0    Studies/Results: No results found.  Impression: Black stools, elevated BUN/creatinine ratio, hemoglobin 9.1, aspirin use, suspicious for upper GI bleed  He is hemodynamically stable, not hypotensive or tachycardic  Plan: Will start him on clear liquid diet and keep him n.p.o. postmidnight with the intention to perform an endoscopic evaluation tomorrow. Patient has  been started on Protonix 40 mg every 12 hours. We will monitor H&H and if hemoglobin is less than 7, plan PRBC transfusion. Continue supportive management.   LOS: 1 day   Kerin Salen, MD  05/21/2023, 10:55 AM

## 2023-05-22 ENCOUNTER — Inpatient Hospital Stay (HOSPITAL_COMMUNITY): Payer: Medicare PPO | Admitting: Anesthesiology

## 2023-05-22 ENCOUNTER — Encounter (HOSPITAL_COMMUNITY)
Admission: EM | Disposition: A | Discharge status: Home Health | Payer: Self-pay | Source: Home / Self Care | Attending: Internal Medicine

## 2023-05-22 ENCOUNTER — Encounter (HOSPITAL_COMMUNITY): Payer: Self-pay | Admitting: Internal Medicine

## 2023-05-22 DIAGNOSIS — K922 Gastrointestinal hemorrhage, unspecified: Secondary | ICD-10-CM | POA: Diagnosis not present

## 2023-05-22 DIAGNOSIS — K317 Polyp of stomach and duodenum: Secondary | ICD-10-CM

## 2023-05-22 DIAGNOSIS — Z87891 Personal history of nicotine dependence: Secondary | ICD-10-CM

## 2023-05-22 DIAGNOSIS — I2510 Atherosclerotic heart disease of native coronary artery without angina pectoris: Secondary | ICD-10-CM

## 2023-05-22 DIAGNOSIS — I10 Essential (primary) hypertension: Secondary | ICD-10-CM

## 2023-05-22 HISTORY — PX: POLYPECTOMY: SHX5525

## 2023-05-22 HISTORY — PX: HEMOSTASIS CLIP PLACEMENT: SHX6857

## 2023-05-22 HISTORY — PX: ESOPHAGOGASTRODUODENOSCOPY (EGD) WITH PROPOFOL: SHX5813

## 2023-05-22 LAB — CBC WITH DIFFERENTIAL/PLATELET
Abs Immature Granulocytes: 0.03 10*3/uL (ref 0.00–0.07)
Basophils Absolute: 0 10*3/uL (ref 0.0–0.1)
Basophils Relative: 1 %
Eosinophils Absolute: 0.3 10*3/uL (ref 0.0–0.5)
Eosinophils Relative: 5 %
HCT: 31 % — ABNORMAL LOW (ref 39.0–52.0)
Hemoglobin: 9.9 g/dL — ABNORMAL LOW (ref 13.0–17.0)
Immature Granulocytes: 0 %
Lymphocytes Relative: 16 %
Lymphs Abs: 1.1 10*3/uL (ref 0.7–4.0)
MCH: 29.8 pg (ref 26.0–34.0)
MCHC: 31.9 g/dL (ref 30.0–36.0)
MCV: 93.4 fL (ref 80.0–100.0)
Monocytes Absolute: 0.8 10*3/uL (ref 0.1–1.0)
Monocytes Relative: 12 %
Neutro Abs: 4.7 10*3/uL (ref 1.7–7.7)
Neutrophils Relative %: 66 %
Platelets: 186 10*3/uL (ref 150–400)
RBC: 3.32 MIL/uL — ABNORMAL LOW (ref 4.22–5.81)
RDW: 18.6 % — ABNORMAL HIGH (ref 11.5–15.5)
WBC: 6.9 10*3/uL (ref 4.0–10.5)
nRBC: 0 % (ref 0.0–0.2)

## 2023-05-22 LAB — GLUCOSE, CAPILLARY
Glucose-Capillary: 126 mg/dL — ABNORMAL HIGH (ref 70–99)
Glucose-Capillary: 63 mg/dL — ABNORMAL LOW (ref 70–99)
Glucose-Capillary: 98 mg/dL (ref 70–99)
Glucose-Capillary: 98 mg/dL (ref 70–99)
Glucose-Capillary: 88 mg/dL (ref 70–99)
Glucose-Capillary: 166 mg/dL — ABNORMAL HIGH (ref 70–99)
Glucose-Capillary: 142 mg/dL — ABNORMAL HIGH (ref 70–99)

## 2023-05-22 SURGERY — ESOPHAGOGASTRODUODENOSCOPY (EGD) WITH PROPOFOL
Anesthesia: Monitor Anesthesia Care

## 2023-05-22 MED ORDER — LIDOCAINE HCL (CARDIAC) PF 50 MG/5ML IV SOSY
PREFILLED_SYRINGE | INTRAVENOUS | Status: DC | PRN
  Administered 2023-05-22: 50 mg via INTRAVENOUS

## 2023-05-22 MED ORDER — LACTATED RINGERS IV SOLN: INTRAVENOUS | Status: DC

## 2023-05-22 MED ORDER — GLYCOPYRROLATE 0.2 MG/ML IJ SOLN
INTRAMUSCULAR | Status: DC | PRN
  Administered 2023-05-22: .2 mg via INTRAVENOUS

## 2023-05-22 MED ORDER — DEXTROSE 50 % IV SOLN
INTRAVENOUS | Status: AC
  Filled 2023-05-22: qty 50

## 2023-05-22 MED ORDER — DEXTROSE 50 % IV SOLN
12.5000 g | INTRAVENOUS | Status: AC
  Administered 2023-05-22: 12.5 g via INTRAVENOUS

## 2023-05-22 MED ORDER — PROPOFOL 500 MG/50ML IV EMUL
INTRAVENOUS | Status: DC | PRN
  Administered 2023-05-22: 80 ug/kg/min via INTRAVENOUS

## 2023-05-22 SURGICAL SUPPLY — 15 items

## 2023-05-22 NOTE — Progress Notes (Signed)
Assumed care of patient at 1500 from [place], [place]. Agree with previously documented assessment and will continue current plan of care.

## 2023-05-22 NOTE — Progress Notes (Signed)
Mobility Specialist - Progress Note   05/22/23 0918  Mobility  Activity Ambulated with assistance in hallway  Level of Assistance Minimal assist, patient does 75% or more  Assistive Device Front wheel walker  Distance Ambulated (ft) 100 ft  Range of Motion/Exercises Active Assistive  Activity Response Tolerated well  Mobility Referral Yes  $Mobility charge 1 Mobility  Mobility Specialist Start Time (ACUTE ONLY) 0908  Mobility Specialist Stop Time (ACUTE ONLY) [id number]  Mobility Specialist Time Calculation (min) (ACUTE ONLY) 10 min   Pt was found in bed and agreeable to ambulate. No complaints with session and at EOS returned to bed with all needs met. Call bell in reach.  Billey Chang Mobility Specialist

## 2023-05-22 NOTE — Progress Notes (Signed)
PROGRESS NOTE    [name]  VZD:[id number] DOB: 1930/03/23 DOA: 05/20/2023 PCP: Sheliah Hatch, PA-C    Brief Narrative:  89 year old gentleman with history of A-fib not on anticoagulation, type 2 diabetes, hypertension, BPH presented with 3 days of melanotic stool.  They had Hemoccult test done at home that was positive, called primary care physician and was asked to go to ER.  Patient denied any upper abdominal symptoms.  Hemoglobin was 10 with recent hemoglobin of 11.   Assessment & Plan:   Suspected upper GI bleeding, acute blood loss anemia: Baseline hemoglobin about 11.  Presented hemoglobin 10-9-9.9. Hemodynamically stable.  No evidence of active bleeding.  Remains on Protonix IV. Seen by GI. Upper GI endoscopy 8/1 with a single 12 mm semipedunculated polyp with bleeding and oozing, recent stigmata of bleeding at gastric body and greater curvature.  Hemostatic clips applied.  Polyp removed.  Due to numbers normal.   Advance diet.   Continue PPI.   Await pathology report.   Recheck hemoglobin tomorrow morning.  Anticipate home tomorrow if stable.  Essential hypertension blood pressure is stable.  Continue to hold antihypertensive with risk of hypotension.  Type 2 diabetes, well-controlled: Patient on Jardiance, insulin, Actos at home.  Remains on low-dose of insulin.     DVT prophylaxis: SCDs Start: 05/20/23 2054   Code Status: Full code Family Communication: None today. Disposition Plan: Status is: Inpatient Remains inpatient appropriate because: Inpatient procedures planned, immediate postop.     Consultants:  Gastroenterology  Procedures:  Upper GI endoscopy, bleeding polyp greater gastric curvature.  Antimicrobials:  None   Subjective: Patient seen in the morning rounds.  No overnight events.  Denies any complaints.  He was looking forward for procedure.  He came back from procedure by the time this note was created.  Objective: Vitals:   05/22/23  1245 05/22/23 1250 05/22/23 1300 05/22/23 1323  BP: (!) 108/38 (!) 111/45 (!) 123/43 (!) 125/57  Pulse: (!) 55 (!) 57 (!) 55 89  Resp: 16 10 18 18   Temp: 97.6 F (36.4 C)   97.9 F (36.6 C)  TempSrc:    Oral  SpO2: 95% 99% 99% 100%  Weight:      Height:        Intake/Output Summary (Last 24 hours) at 05/22/2023 1337 Last data filed at 05/22/2023 0835 Gross per 24 hour  Intake 0 ml  Output --  Net 0 ml   Filed Weights   05/20/23 1541 05/20/23 2215 05/22/23 1100  Weight: 83 kg 79.6 kg 79.6 kg    Examination:  General exam: Appears calm and comfortable  Hard of hearing.  Comfortable and pleasant in conversation. Respiratory system: No added sounds. Cardiovascular system: S1 & S2 heard, RRR. Gastrointestinal system: Abdomen is nondistended, soft and nontender. No organomegaly or masses felt. Normal bowel sounds heard. Central nervous system: Alert and oriented. No focal neurological deficits.    Data Reviewed: I have personally reviewed following labs and imaging studies  CBC: Recent Labs  Lab 05/20/23 1602 05/21/23 0614 05/22/23 0830  WBC 8.2 6.1 6.9  NEUTROABS 5.8  --  4.7  HGB 10.3* 9.1* 9.9*  HCT 33.2* 29.2* 31.0*  MCV 93.5 93.0 93.4  PLT 185 175 186   Basic Metabolic Panel: Recent Labs  Lab 05/20/23 1602 05/21/23 0614  NA 137 140  K 4.4 3.9  CL 107 108  CO2 23 23  GLUCOSE 230* 148*  BUN 39* 30*  CREATININE 1.40* 1.18  CALCIUM 9.1  9.0  MG  --  2.2   GFR: Estimated Creatinine Clearance: 44.8 mL/min (by C-G formula based on SCr of 1.18 mg/dL). Liver Function Tests: Recent Labs  Lab 05/20/23 1602  AST 17  ALT 14  ALKPHOS 81  BILITOT 0.8  PROT 7.0  ALBUMIN 3.7   No results for input(s): "LIPASE", "AMYLASE" in the last 168 hours. No results for input(s): "AMMONIA" in the last 168 hours. Coagulation Profile: Recent Labs  Lab 05/20/23 1602  INR 1.0   Cardiac Enzymes: No results for input(s): "CKTOTAL", "CKMB", "CKMBINDEX", "TROPONINI" in  the last 168 hours. BNP (last 3 results) No results for input(s): "PROBNP" in the last 8760 hours. HbA1C: No results for input(s): "HGBA1C" in the last 72 hours. CBG: Recent Labs  Lab 05/22/23 0732 05/22/23 1126 05/22/23 1154 05/22/23 1310 05/22/23 1330  GLUCAP 126* 63* 98 98 88   Lipid Profile: No results for input(s): "CHOL", "HDL", "LDLCALC", "TRIG", "CHOLHDL", "LDLDIRECT" in the last 72 hours. Thyroid Function Tests: No results for input(s): "TSH", "T4TOTAL", "FREET4", "T3FREE", "THYROIDAB" in the last 72 hours. Anemia Panel: No results for input(s): "VITAMINB12", "FOLATE", "FERRITIN", "TIBC", "IRON", "RETICCTPCT" in the last 72 hours. Sepsis Labs: No results for input(s): "PROCALCITON", "LATICACIDVEN" in the last 168 hours.  No results found for this or any previous visit (from the past 240 hour(s)).       Radiology Studies: No results found.      Scheduled Meds:  vitamin B-12  1,000 mcg Oral Daily   cycloSPORINE  1 drop Both Eyes BID   diltiazem  180 mg Oral Daily   gabapentin  800 mg Oral TID   insulin aspart  0-5 Units Subcutaneous QHS   insulin aspart  0-9 Units Subcutaneous TID WC   insulin glargine-yfgn  5 Units Subcutaneous QHS   omega-3 acid ethyl esters  2 g Oral Daily   pantoprazole (PROTONIX) IV  40 mg Intravenous Q12H   traZODone  100 mg Oral QHS   Continuous Infusions:     LOS: 2 days    Time spent: 35 minutes    Dorcas Carrow, MD [facility]

## 2023-05-22 NOTE — Interval H&P Note (Signed)
History and Physical Interval Note: 89/male with black stools,on ASA, anemia for EGD with propofol.  05/22/2023 12:12 PM  [name]  has presented today for EGD with the diagnosis of melena.  The various methods of treatment have been discussed with the patient and family. After consideration of risks, benefits and other options for treatment, the patient has consented to  Procedure(s): ESOPHAGOGASTRODUODENOSCOPY (EGD) WITH PROPOFOL (N/A) as a surgical intervention.  The patient's history has been reviewed, patient examined, no change in status, stable for surgery.  I have reviewed the patient's chart and labs.  Questions were answered to the patient's satisfaction.     Kerin Salen

## 2023-05-22 NOTE — TOC Progression Note (Signed)
Transition of Care [facility]) - Progression Note    Patient Details  Name: [name] MRN: [id number] Date of Birth: [birth year]  Transition of [facility]) CM/SW Contact  Geni Bers, RN Phone Number: 05/22/2023, 2:14 PM  Clinical Narrative:    TOC will follow for [facility] needs.   Expected Discharge Plan: Home w Home Health Services Barriers to Discharge: No Barriers Identified  Expected Discharge Plan and Services       Living arrangements for the past 2 months: Single Family Home                                       Social Determinants of Health (SDOH) Interventions SDOH Screenings   Food Insecurity: No Food Insecurity (05/21/2023)  Housing: Low Risk  (05/21/2023)  Transportation Needs: No Transportation Needs (05/21/2023)  Utilities: Not At Risk (05/21/2023)  Tobacco Use: Medium Risk (05/22/2023)    Readmission Risk Interventions    02/22/2023    1:30 PM  Readmission Risk Prevention Plan  Transportation Screening Complete  PCP or Specialist Appt within 3-5 Days Complete  HRI or Home Care Consult Complete  Social Work Consult for Recovery Care Planning/Counseling Complete  Palliative Care Screening Not Applicable  Medication Review [employer]) Complete

## 2023-05-22 NOTE — Plan of Care (Signed)
  Problem: Education: Goal: Ability to describe self-care measures that may prevent or decrease complications (Diabetes Survival Skills Education) will improve Outcome: Progressing Goal: Individualized Educational Video(s) Outcome: Progressing   

## 2023-05-22 NOTE — Transfer of Care (Signed)
Immediate Anesthesia Transfer of Care Note  Patient: [name]  Procedure(s) Performed: ESOPHAGOGASTRODUODENOSCOPY (EGD) WITH PROPOFOL HEMOSTASIS CLIP PLACEMENT POLYPECTOMY  Patient Location: PACU and Endoscopy Unit  Anesthesia Type:MAC  Level of Consciousness: awake and alert   Airway & Oxygen Therapy: Patient Spontanous Breathing  Post-op Assessment: Report given to RN  Post vital signs: stable  Last Vitals:  Vitals Value Taken Time  BP    Temp    Pulse    Resp 15 05/22/23 1248  SpO2    Vitals shown include unfiled device data.  Last Pain:  Vitals:   05/22/23 1100  TempSrc: Temporal  PainSc: 0-No pain      Patients Stated Pain Goal: 2 (05/22/23 1610)  Complications: No notable events documented.

## 2023-05-22 NOTE — Op Note (Signed)
[facility] Patient Name: [name] Procedure Date: 05/22/2023 MRN: [id number] Attending MD: Kerin Salen , MD, [id number] Date of Birth: 09/07/1930 CSN: [id number] Age: 89 Admit Type: Outpatient Procedure:                Upper GI endoscopy Indications:              Melena Providers:                Kerin Salen, MD, Martha Clan, RN, Salley Scarlet, Technician, Reggy Eye, CRNA Referring MD:             [facility] Medicines:                See the Anesthesia note for documentation of the                            administered medications Complications:            No immediate complications. Estimated blood loss:                            Minimal. Estimated Blood Loss:     Estimated blood loss was minimal. Procedure:                Pre-Anesthesia Assessment:                           - Prior to the procedure, a History and Physical                            was performed, and patient medications and                            allergies were reviewed. The patient's tolerance of                            previous anesthesia was also reviewed. The risks                            and benefits of the procedure and the sedation                            options and risks were discussed with the patient.                            All questions were answered, and informed consent                            was obtained. Prior Anticoagulants: The patient has                            taken no anticoagulant or antiplatelet agents                            except  for aspirin. ASA Grade Assessment: III - A                            patient with severe systemic disease. After                            reviewing the risks and benefits, the patient was                            deemed in satisfactory condition to undergo the                            procedure.                           After obtaining informed consent, the endoscope  was                            passed under direct vision. Throughout the                            procedure, the patient's blood pressure, pulse, and                            oxygen saturations were monitored continuously. The                            [id number]-H190 ([id number]) Olympus endoscope was introduced                            through the mouth, and advanced to the second part                            of duodenum. The upper GI endoscopy was                            accomplished without difficulty. The patient                            tolerated the procedure well. Scope In: Scope Out: Findings:      The examined esophagus was significantly tortuous.      A non-obstructing Schatzki ring was found at the gastroesophageal       junction.      A 2 cm hiatal hernia was present.      A single, 12 mm semi-pedunculated polyp with bleeding/oozing and       stigmata of recent bleeding was found in the gastric body and on the       greater curvature of the stomach. Three hemostatic clips were       successfully placed (MR conditional) at the base of the polyp. Clip       manufacturer: [employer].      The polyp was removed with a hot snare. Resection and retrieval were       complete. It was retrieved with a net.      There was no bleeding  at the end of the procedure.      Diffuse moderately congested mucosa was found in the entire examined       stomach.      The cardia and gastric fundus were normal on retroflexion.      The examined duodenum was normal. Impression:               - Tortuous esophagus.                           - Non-obstructing Schatzki ring.                           - 2 cm hiatal hernia.                           - A single gastric polyp. Resected and retrieved.                            Clips (MR conditional) were placed. Clip                            manufacturer: [employer].                           - Congestive gastropathy.                            - Normal examined duodenum. Moderate Sedation:      Patient did not receive moderate sedation for this procedure, but       instead received monitored anesthesia care. Recommendation:           - Resume regular diet.                           - Continue present medications.                           - Await pathology results. Procedure Code(s):        --- Professional ---                           [phone], Esophagogastroduodenoscopy, flexible,                            transoral; with removal of tumor(s), polyp(s), or                            other lesion(s) by snare technique Diagnosis Code(s):        --- Professional ---                           Q39.9, Congenital malformation of esophagus,                            unspecified                           K22.2, Esophageal obstruction  K44.9, Diaphragmatic hernia without obstruction or                            gangrene                           K31.7, Polyp of stomach and duodenum                           K31.89, Other diseases of stomach and duodenum                           K92.1, Melena (includes Hematochezia) CPT copyright 2022 [employer]. All rights reserved. The codes documented in this report are preliminary and upon coder review may  be revised to meet current compliance requirements. Kerin Salen, MD 05/22/2023 12:43:37 PM This report has been signed electronically. Number of Addenda: 0

## 2023-05-23 DIAGNOSIS — K922 Gastrointestinal hemorrhage, unspecified: Secondary | ICD-10-CM | POA: Diagnosis not present

## 2023-05-23 DIAGNOSIS — E1142 Type 2 diabetes mellitus with diabetic polyneuropathy: Secondary | ICD-10-CM | POA: Diagnosis not present

## 2023-05-23 LAB — CBC WITH DIFFERENTIAL/PLATELET
Abs Immature Granulocytes: 0.04 10*3/uL (ref 0.00–0.07)
Basophils Absolute: 0.1 10*3/uL (ref 0.0–0.1)
Basophils Relative: 1 %
Eosinophils Absolute: 0.3 10*3/uL (ref 0.0–0.5)
Eosinophils Relative: 4 %
HCT: 31.3 % — ABNORMAL LOW (ref 39.0–52.0)
Hemoglobin: 9.6 g/dL — ABNORMAL LOW (ref 13.0–17.0)
Immature Granulocytes: 1 %
Lymphocytes Relative: 17 %
Lymphs Abs: 1.2 10*3/uL (ref 0.7–4.0)
MCH: 29.1 pg (ref 26.0–34.0)
MCHC: 30.7 g/dL (ref 30.0–36.0)
MCV: 94.8 fL (ref 80.0–100.0)
Monocytes Absolute: 0.8 10*3/uL (ref 0.1–1.0)
Monocytes Relative: 12 %
Neutro Abs: 4.5 10*3/uL (ref 1.7–7.7)
Neutrophils Relative %: 65 %
Platelets: 170 10*3/uL (ref 150–400)
RBC: 3.3 MIL/uL — ABNORMAL LOW (ref 4.22–5.81)
RDW: 18.6 % — ABNORMAL HIGH (ref 11.5–15.5)
WBC: 6.8 10*3/uL (ref 4.0–10.5)
nRBC: 0 % (ref 0.0–0.2)

## 2023-05-23 LAB — GLUCOSE, CAPILLARY
Glucose-Capillary: 234 mg/dL — ABNORMAL HIGH (ref 70–99)
Glucose-Capillary: 200 mg/dL — ABNORMAL HIGH (ref 70–99)

## 2023-05-23 MED ORDER — PANTOPRAZOLE SODIUM 40 MG PO TBEC: 40.0000 mg | DELAYED_RELEASE_TABLET | Freq: Every day | ORAL | 2 refills | Status: AC

## 2023-05-23 MED ORDER — PANTOPRAZOLE SODIUM 40 MG PO TBEC: 40.0000 mg | DELAYED_RELEASE_TABLET | Freq: Two times a day (BID) | ORAL | Status: DC

## 2023-05-23 NOTE — Care Management Important Message (Signed)
Important Message  Patient Details IM Letter given. Name: [name] MRN: [id number] Date of Birth: Sep 29, 1930   Medicare Important Message Given:  Yes     [name] 05/23/2023, 11:50 AM

## 2023-05-23 NOTE — Discharge Summary (Signed)
Physician Discharge Summary  [name] BMW:[id number] DOB: 1930/04/24 DOA: 05/20/2023  PCP: Sheliah Hatch, PA-C  Admit date: 05/20/2023 Discharge date: 05/23/2023  Admitted From: Home Disposition: Home  Recommendations for Outpatient Follow-up:  Follow up with PCP in 1-2 weeks Please obtain CBC in one week Please follow up on the following pending results: GI office will call to schedule follow-up  Home Health: N/A Equipment/Devices: N/A  Discharge Condition: Stable CODE STATUS: Full code Diet recommendation: Low-salt and low-carb diet  Discharge summary: 89 year old gentleman with history of A-fib not on anticoagulation, type 2 diabetes, hypertension, BPH presented with 3 days of melanotic stool.  had Hemoccult test done at home that was positive, called primary care physician and was asked to go to ER.  Patient denied any upper abdominal symptoms.  Hemoglobin was 10 with recent hemoglobin of 11.  Melanotic stool, acute blood loss anemia secondary to upper GI bleeding. Patient was treated with IV fluids, IV Protonix. Hemoglobin was relatively stable after initial drop.  Today it is 9.6. Seen by GI. Upper GI endoscopy 8/1 with a single 12 mm semipedunculated polyp with bleeding and oozing, recent stigmata of bleeding at gastric body and greater curvature.  Hemostatic clips applied.  Polyp removed.   Hemoglobin is stable.  Tolerating diet.  GI recommended PPI once daily.  Will change omeprazole to pantoprazole 40 mg daily. Since he has remained relatively stable with no evidence of ongoing bleeding, hemostasis maintained will resume aspirin.  Will need to recheck hemoglobin in about a week.   Essential hypertension blood pressure is stable.  Resume home medications.     Type 2 diabetes, well-controlled: Patient on Jardiance, insulin, Actos at home.  Resume.  Resume antilipid therapy and other home medications.  Stable for discharge home with family.   Discharge Diagnoses:   Principal Problem:   GI bleed Active Problems:   Essential hypertension   Diabetes (HCC)   Diabetic peripheral neuropathy associated with type 2 diabetes mellitus (HCC)   Malignant melanoma of head and neck (HCC)   Acute upper GI bleeding    Discharge Instructions  Discharge Instructions     Diet - low sodium heart healthy   Complete by: As directed    Increase activity slowly   Complete by: As directed       Allergies as of 05/23/2023   No Active Allergies      Medication List     STOP taking these medications    insulin detemir 100 UNIT/ML injection Commonly known as: LEVEMIR   mupirocin cream 2 % Commonly known as: BACTROBAN   nitroGLYCERIN 0.3 MG SL tablet Commonly known as: NITROSTAT   omeprazole 20 MG capsule Commonly known as: PRILOSEC       TAKE these medications    acetaminophen 500 MG tablet Commonly known as: TYLENOL Take 500 mg by mouth every 6 (six) hours as needed for moderate pain (for pain).   aspirin EC 81 MG tablet Take 1 tablet (81 mg total) by mouth daily.   atorvastatin 80 MG tablet Commonly known as: LIPITOR Take 80 mg by mouth every evening.   cyanocobalamin 1000 MCG tablet Take 1 tablet (1,000 mcg total) by mouth daily.   cycloSPORINE 0.05 % ophthalmic emulsion Commonly known as: RESTASIS Place 1 drop into both eyes 2 (two) times daily.   diltiazem 180 MG 24 hr tablet Commonly known as: CARDIZEM LA Take 180 mg by mouth daily.   empagliflozin 25 MG Tabs tablet Commonly known as: JARDIANCE Take  25 mg by mouth daily.   furosemide 20 MG tablet Commonly known as: Lasix Take 1 tablet (20 mg total) by mouth as needed (Weigh yourself daily.  If weight increases by more than 2 pounds from day before or 5 pounds compared to prior week take one dose.).   gabapentin 800 MG tablet Commonly known as: NEURONTIN Take 800 mg by mouth 3 (three) times daily.   GLUCOSE PO Take 1 tablet by mouth daily as needed (Low blood  sugar).   isosorbide mononitrate 30 MG 24 hr tablet Commonly known as: IMDUR Take 1 tablet (30 mg total) by mouth daily.   Lantus SoloStar 100 UNIT/ML Solostar Pen Generic drug: insulin glargine Inject 5-9 Units into the skin at bedtime. Sliding scale   lidocaine 5 % Commonly known as: LIDODERM Place 1 patch onto the skin daily as needed (for pain- on 12 hours/off 12 hours).   losartan 25 MG tablet Commonly known as: COZAAR Take 1 tablet (25 mg total) by mouth daily before breakfast.   Omega-3 1000 MG Caps Take 2,000 mg by mouth daily.   pantoprazole 40 MG tablet Commonly known as: PROTONIX Take 1 tablet (40 mg total) by mouth daily.   pioglitazone 45 MG tablet Commonly known as: ACTOS Take 45 mg by mouth daily.   traZODone 100 MG tablet Commonly known as: DESYREL Take 100 mg by mouth at bedtime.        No Active Allergies  Consultations: Gastroenterology   Procedures/Studies: No results found. (Echo, Carotid, EGD, Colonoscopy, ERCP)    Subjective: Patient seen and examined.  No overnight events.  Denies any nausea vomiting.  Eager to go home.   Discharge Exam: Vitals:   05/22/23 2111 05/23/23 0659  BP: (!) 133/50 (!) 121/55  Pulse: (!) 57 (!) 56  Resp: 18 16  Temp: 98.1 F (36.7 C) 98 F (36.7 C)  SpO2: 98% 97%   Vitals:   05/22/23 1300 05/22/23 1323 05/22/23 2111 05/23/23 0659  BP: (!) 123/43 (!) 125/57 (!) 133/50 (!) 121/55  Pulse: (!) 55 89 (!) 57 (!) 56  Resp: 18 18 18 16   Temp:  97.9 F (36.6 C) 98.1 F (36.7 C) 98 F (36.7 C)  TempSrc:  Oral Oral Oral  SpO2: 99% 100% 98% 97%  Weight:      Height:        General: Pt is alert, awake, not in acute distress Hard of hearing.  Comfortable and pleasant on conversation. Cardiovascular: RRR, S1/S2 +, no rubs, no gallops Respiratory: CTA bilaterally, no wheezing, no rhonchi Abdominal: Soft, NT, ND, bowel sounds + Extremities: no edema, no cyanosis    The results of significant  diagnostics from this hospitalization (including imaging, microbiology, ancillary and laboratory) are listed below for reference.     Microbiology: No results found for this or any previous visit (from the past 240 hour(s)).   Labs: BNP (last 3 results) Recent Labs    02/22/23 0355 05/21/23 0614  BNP 415.0* 729.0*   Basic Metabolic Panel: Recent Labs  Lab 05/20/23 1602 05/21/23 0614  NA 137 140  K 4.4 3.9  CL 107 108  CO2 23 23  GLUCOSE 230* 148*  BUN 39* 30*  CREATININE 1.40* 1.18  CALCIUM 9.1 9.0  MG  --  2.2   Liver Function Tests: Recent Labs  Lab 05/20/23 1602  AST 17  ALT 14  ALKPHOS 81  BILITOT 0.8  PROT 7.0  ALBUMIN 3.7   No results for input(s): "LIPASE", "  AMYLASE" in the last 168 hours. No results for input(s): "AMMONIA" in the last 168 hours. CBC: Recent Labs  Lab 05/20/23 1602 05/21/23 0614 05/22/23 0830 05/23/23 0818  WBC 8.2 6.1 6.9 6.8  NEUTROABS 5.8  --  4.7 4.5  HGB 10.3* 9.1* 9.9* 9.6*  HCT 33.2* 29.2* 31.0* 31.3*  MCV 93.5 93.0 93.4 94.8  PLT 185 175 186 170   Cardiac Enzymes: No results for input(s): "CKTOTAL", "CKMB", "CKMBINDEX", "TROPONINI" in the last 168 hours. BNP: Invalid input(s): "POCBNP" CBG: Recent Labs  Lab 05/22/23 1330 05/22/23 1652 05/22/23 2113 05/23/23 0818 05/23/23 1122  GLUCAP 88 166* 142* 234* 200*   D-Dimer No results for input(s): "DDIMER" in the last 72 hours. Hgb A1c No results for input(s): "HGBA1C" in the last 72 hours. Lipid Profile No results for input(s): "CHOL", "HDL", "LDLCALC", "TRIG", "CHOLHDL", "LDLDIRECT" in the last 72 hours. Thyroid function studies No results for input(s): "TSH", "T4TOTAL", "T3FREE", "THYROIDAB" in the last 72 hours.  Invalid input(s): "FREET3" Anemia work up No results for input(s): "VITAMINB12", "FOLATE", "FERRITIN", "TIBC", "IRON", "RETICCTPCT" in the last 72 hours. Urinalysis    Component Value Date/Time   COLORURINE YELLOW 02/20/2023 1229   APPEARANCEUR  CLEAR 02/20/2023 1229   LABSPEC 1.016 02/20/2023 1229   PHURINE 5.0 02/20/2023 1229   GLUCOSEU >=500 (A) 02/20/2023 1229   HGBUR SMALL (A) 02/20/2023 1229   BILIRUBINUR NEGATIVE 02/20/2023 1229   KETONESUR 20 (A) 02/20/2023 1229   PROTEINUR 100 (A) 02/20/2023 1229   UROBILINOGEN 0.2 11/10/2012 1432   NITRITE NEGATIVE 02/20/2023 1229   LEUKOCYTESUR MODERATE (A) 02/20/2023 1229   Sepsis Labs Recent Labs  Lab 05/20/23 1602 05/21/23 0614 05/22/23 0830 05/23/23 0818  WBC 8.2 6.1 6.9 6.8   Microbiology No results found for this or any previous visit (from the past 240 hour(s)).   Time coordinating discharge:  35 minutes  SIGNED:   Dorcas Carrow, MD  [facility] 05/23/2023, 11:25 AM

## 2023-05-23 NOTE — Progress Notes (Signed)
Mobility Specialist - Progress Note  Pre-mobility: 65 bpm HR,  During mobility: 82 bpm HR,  Post-mobility: 56 bpm HR,    05/23/23 0851  Mobility  Activity Ambulated with assistance in hallway  Level of Assistance Minimal assist, patient does 75% or more  Assistive Device Front wheel walker  Distance Ambulated (ft) 126 ft  Range of Motion/Exercises Active Assistive  Activity Response Tolerated well  Mobility Referral Yes  $Mobility charge 1 Mobility  Mobility Specialist Start Time (ACUTE ONLY) [id number]  Mobility Specialist Stop Time (ACUTE ONLY) 0851  Mobility Specialist Time Calculation (min) (ACUTE ONLY) 13 min   Pt was found in bed and agreeable to ambulate. Was min-A for bed mobility and STS. SB for ambulation with no complaints. At EOS returned to recliner chair with all needs met. Call bell in reach and chair alarm on.  Billey Chang Mobility Specialist

## 2023-05-24 ENCOUNTER — Encounter (HOSPITAL_COMMUNITY): Payer: Self-pay | Admitting: Gastroenterology

## 2023-05-25 NOTE — Anesthesia Postprocedure Evaluation (Signed)
Anesthesia Post Note  Patient: [name]  Procedure(s) Performed: ESOPHAGOGASTRODUODENOSCOPY (EGD) WITH PROPOFOL HEMOSTASIS CLIP PLACEMENT POLYPECTOMY     Patient location during evaluation: Endoscopy Anesthesia Type: MAC Level of consciousness: oriented, awake and alert and awake Pain management: pain level controlled Vital Signs Assessment: post-procedure vital signs reviewed and stable Respiratory status: spontaneous breathing, nonlabored ventilation, respiratory function stable and patient connected to nasal cannula oxygen Cardiovascular status: blood pressure returned to baseline and stable Postop Assessment: no headache, no backache and no apparent nausea or vomiting Anesthetic complications: no   No notable events documented.  Last Vitals:  Vitals:   05/22/23 2111 05/23/23 0659  BP: (!) 133/50 (!) 121/55  Pulse: (!) 57 (!) 56  Resp: 18 16  Temp: 36.7 C 36.7 C  SpO2: 98% 97%    Last Pain:  Vitals:   05/23/23 0821  TempSrc:   PainSc: 0-No pain                 Collene Schlichter

## 2023-06-04 DIAGNOSIS — K250 Acute gastric ulcer with hemorrhage: Secondary | ICD-10-CM | POA: Diagnosis not present

## 2023-06-12 DIAGNOSIS — H903 Sensorineural hearing loss, bilateral: Secondary | ICD-10-CM | POA: Diagnosis not present

## 2023-07-18 ENCOUNTER — Ambulatory Visit: Payer: Medicare PPO | Admitting: Podiatry

## 2023-07-22 ENCOUNTER — Ambulatory Visit: Payer: Medicare PPO | Admitting: Podiatry

## 2023-07-22 ENCOUNTER — Encounter: Payer: Self-pay | Admitting: Podiatry

## 2023-07-22 DIAGNOSIS — B351 Tinea unguium: Secondary | ICD-10-CM

## 2023-07-22 DIAGNOSIS — M79675 Pain in left toe(s): Secondary | ICD-10-CM | POA: Diagnosis not present

## 2023-07-22 DIAGNOSIS — E1142 Type 2 diabetes mellitus with diabetic polyneuropathy: Secondary | ICD-10-CM

## 2023-07-22 DIAGNOSIS — M79674 Pain in right toe(s): Secondary | ICD-10-CM | POA: Diagnosis not present

## 2023-07-22 NOTE — Progress Notes (Signed)
.  This patient returns to my office for at risk foot care.  This patient requires this care by a professional since this patient will be at risk due to having diabetic neuropathy.  This patient is unable to cut nails himself since the patient cannot reach his nails.These nails are painful walking and wearing shoes.  This patient presents for at risk foot care today.  General Appearance  Alert, conversant and in no acute stress.  Vascular  Dorsalis pedis and posterior tibial  pulses are  weakly palpable  bilaterally.  Capillary return is within normal limits  bilaterally. Temperature is within normal limits  bilaterally.  Neurologic  Senn-Weinstein monofilament wire test within normal limits  bilaterally. Muscle power within normal limits bilaterally.  Nails Thick disfigured discolored nails with subungual debris  from hallux to fifth toes bilaterally. No evidence of bacterial infection or drainage bilaterally.  Orthopedic  No limitations of motion  feet .  No crepitus or effusions noted.  No bony pathology or digital deformities noted.  Skin  normotropic skin with no porokeratosis noted bilaterally.  No signs of infections or ulcers noted.     Onychomycosis  Pain in right toes  Pain in left toes  Consent was obtained for treatment procedures.   Mechanical debridement of nails 1-5  bilaterally performed with a nail nipper.  Filed with dremel without incident.    Return office visit     3 months                 Told patient to return for periodic foot care and evaluation due to potential at risk complications.   Helane Gunther DPM

## 2023-08-11 DIAGNOSIS — D6869 Other thrombophilia: Secondary | ICD-10-CM | POA: Diagnosis not present

## 2023-08-11 DIAGNOSIS — E11319 Type 2 diabetes mellitus with unspecified diabetic retinopathy without macular edema: Secondary | ICD-10-CM | POA: Diagnosis not present

## 2023-08-11 DIAGNOSIS — I4891 Unspecified atrial fibrillation: Secondary | ICD-10-CM | POA: Diagnosis not present

## 2023-08-11 DIAGNOSIS — E782 Mixed hyperlipidemia: Secondary | ICD-10-CM | POA: Diagnosis not present

## 2023-08-11 DIAGNOSIS — E1142 Type 2 diabetes mellitus with diabetic polyneuropathy: Secondary | ICD-10-CM | POA: Diagnosis not present

## 2023-08-11 DIAGNOSIS — Z794 Long term (current) use of insulin: Secondary | ICD-10-CM | POA: Diagnosis not present

## 2023-08-11 DIAGNOSIS — I10 Essential (primary) hypertension: Secondary | ICD-10-CM | POA: Diagnosis not present

## 2023-08-11 DIAGNOSIS — Z0000 Encounter for general adult medical examination without abnormal findings: Secondary | ICD-10-CM | POA: Diagnosis not present

## 2023-08-11 DIAGNOSIS — I700 Atherosclerosis of aorta: Secondary | ICD-10-CM | POA: Diagnosis not present

## 2023-08-13 ENCOUNTER — Other Ambulatory Visit: Payer: Self-pay

## 2023-08-13 ENCOUNTER — Emergency Department (HOSPITAL_BASED_OUTPATIENT_CLINIC_OR_DEPARTMENT_OTHER)
Admission: EM | Admit: 2023-08-13 | Discharge: 2023-08-13 | Disposition: A | Discharge status: Home / Self Care | Payer: Medicare PPO | Attending: Emergency Medicine | Admitting: Emergency Medicine

## 2023-08-13 ENCOUNTER — Emergency Department (HOSPITAL_BASED_OUTPATIENT_CLINIC_OR_DEPARTMENT_OTHER): Payer: Medicare PPO

## 2023-08-13 ENCOUNTER — Encounter (HOSPITAL_BASED_OUTPATIENT_CLINIC_OR_DEPARTMENT_OTHER): Payer: Self-pay | Admitting: *Deleted

## 2023-08-13 DIAGNOSIS — S0990XA Unspecified injury of head, initial encounter: Secondary | ICD-10-CM | POA: Diagnosis not present

## 2023-08-13 DIAGNOSIS — S51811A Laceration without foreign body of right forearm, initial encounter: Secondary | ICD-10-CM | POA: Diagnosis not present

## 2023-08-13 DIAGNOSIS — S59911A Unspecified injury of right forearm, initial encounter: Secondary | ICD-10-CM | POA: Diagnosis present

## 2023-08-13 DIAGNOSIS — Y92810 Car as the place of occurrence of the external cause: Secondary | ICD-10-CM | POA: Diagnosis not present

## 2023-08-13 DIAGNOSIS — W01198A Fall on same level from slipping, tripping and stumbling with subsequent striking against other object, initial encounter: Secondary | ICD-10-CM | POA: Insufficient documentation

## 2023-08-13 DIAGNOSIS — M533 Sacrococcygeal disorders, not elsewhere classified: Secondary | ICD-10-CM | POA: Diagnosis not present

## 2023-08-13 DIAGNOSIS — W19XXXA Unspecified fall, initial encounter: Secondary | ICD-10-CM | POA: Diagnosis not present

## 2023-08-13 DIAGNOSIS — K4090 Unilateral inguinal hernia, without obstruction or gangrene, not specified as recurrent: Secondary | ICD-10-CM | POA: Diagnosis not present

## 2023-08-13 DIAGNOSIS — S61211A Laceration without foreign body of left index finger without damage to nail, initial encounter: Secondary | ICD-10-CM | POA: Diagnosis not present

## 2023-08-13 DIAGNOSIS — S299XXA Unspecified injury of thorax, initial encounter: Secondary | ICD-10-CM | POA: Diagnosis not present

## 2023-08-13 DIAGNOSIS — Z7984 Long term (current) use of oral hypoglycemic drugs: Secondary | ICD-10-CM | POA: Insufficient documentation

## 2023-08-13 DIAGNOSIS — I10 Essential (primary) hypertension: Secondary | ICD-10-CM | POA: Diagnosis not present

## 2023-08-13 DIAGNOSIS — N400 Enlarged prostate without lower urinary tract symptoms: Secondary | ICD-10-CM | POA: Diagnosis not present

## 2023-08-13 DIAGNOSIS — J439 Emphysema, unspecified: Secondary | ICD-10-CM | POA: Diagnosis not present

## 2023-08-13 DIAGNOSIS — E119 Type 2 diabetes mellitus without complications: Secondary | ICD-10-CM | POA: Diagnosis not present

## 2023-08-13 DIAGNOSIS — S0083XA Contusion of other part of head, initial encounter: Secondary | ICD-10-CM | POA: Diagnosis not present

## 2023-08-13 DIAGNOSIS — Z79899 Other long term (current) drug therapy: Secondary | ICD-10-CM | POA: Diagnosis not present

## 2023-08-13 DIAGNOSIS — Z794 Long term (current) use of insulin: Secondary | ICD-10-CM | POA: Diagnosis not present

## 2023-08-13 DIAGNOSIS — I6782 Cerebral ischemia: Secondary | ICD-10-CM | POA: Diagnosis not present

## 2023-08-13 DIAGNOSIS — S199XXA Unspecified injury of neck, initial encounter: Secondary | ICD-10-CM | POA: Diagnosis not present

## 2023-08-13 DIAGNOSIS — J90 Pleural effusion, not elsewhere classified: Secondary | ICD-10-CM | POA: Diagnosis not present

## 2023-08-13 DIAGNOSIS — Z7982 Long term (current) use of aspirin: Secondary | ICD-10-CM | POA: Insufficient documentation

## 2023-08-13 DIAGNOSIS — S61213A Laceration without foreign body of left middle finger without damage to nail, initial encounter: Secondary | ICD-10-CM | POA: Diagnosis not present

## 2023-08-13 DIAGNOSIS — Z043 Encounter for examination and observation following other accident: Secondary | ICD-10-CM | POA: Diagnosis not present

## 2023-08-13 NOTE — ED Triage Notes (Signed)
Pt is here by [facility] due to fall after tripping getting out of car. Pt sustained a skin tear on right forearm and left middle finer.  Did not hit his head.  No LOC.  Dressing in place to skin tears.  Pt is alert and oriented and hard of hearing.  Lives at home with his wife.

## 2023-08-13 NOTE — ED Provider Notes (Signed)
[place] [facility] DEPARTMENT AT [facility] Provider Note   CSN: [id number] Arrival date & time: 08/13/23  1727     History {Add pertinent medical, surgical, social history, OB history to HPI:1} Chief Complaint  Patient presents with   [name] is a 89 y.o. male with a history of atrial fibrillation, diabetes mellitus, and hypertension who presents to the ED today with his wife after a fall.  Patient was getting out of a car and going to grab his walker when he tripped and fell on his back.  He thinks he hit his head but denies loss of consciousness or blood thinner use.  He reports pain to the back of the head and  tailbone.  He has skin tears on his right forearm and left middle finger.  He denies weakness, vision changes, or slurred speech.  Patient's wife says that she was still in the car so she did not see him fall.    Home Medications Prior to Admission medications   Medication Sig Start Date End Date Taking? Authorizing Provider  acetaminophen (TYLENOL) 500 MG tablet Take 500 mg by mouth every 6 (six) hours as needed for moderate pain (for pain).    [provider]  aspirin EC 81 MG tablet Take 1 tablet (81 mg total) by mouth daily. 07/13/19   Runell Gess, MD  atorvastatin (LIPITOR) 80 MG tablet Take 80 mg by mouth every evening.     [provider]  cyanocobalamin 1000 MCG tablet Take 1 tablet (1,000 mcg total) by mouth daily. 02/23/23   Shalhoub, Deno Lunger, MD  cycloSPORINE (RESTASIS) 0.05 % ophthalmic emulsion Place 1 drop into both eyes 2 (two) times daily.    [provider]  Dextrose, Diabetic Use, (GLUCOSE PO) Take 1 tablet by mouth daily as needed (Low blood sugar).    [provider]  diltiazem (CARDIZEM LA) 180 MG 24 hr tablet Take 180 mg by mouth daily. 04/29/22   [provider]  empagliflozin (JARDIANCE) 25 MG TABS tablet Take 25 mg by mouth daily. 05/02/22   [provider]  furosemide  (LASIX) 20 MG tablet Take 1 tablet (20 mg total) by mouth as needed (Weigh yourself daily.  If weight increases by more than 2 pounds from day before or 5 pounds compared to prior week take one dose.). Patient not taking: Reported on 05/02/2023 02/22/23 02/22/24  Marinda Elk, MD  gabapentin (NEURONTIN) 800 MG tablet Take 800 mg by mouth 3 (three) times daily. 08/01/21   [provider]  isosorbide mononitrate (IMDUR) 30 MG 24 hr tablet Take 1 tablet (30 mg total) by mouth daily. 05/02/23   Runell Gess, MD  LANTUS SOLOSTAR 100 UNIT/ML Solostar Pen Inject 5-9 Units into the skin at bedtime. Sliding scale 03/24/23   [provider]  lidocaine (LIDODERM) 5 % Place 1 patch onto the skin daily as needed (for pain- on 12 hours/off 12 hours). 08/07/21   [provider]  losartan (COZAAR) 25 MG tablet Take 1 tablet (25 mg total) by mouth daily before breakfast. 01/22/22 05/20/23  Dahal, Melina Schools, MD  Omega-3 1000 MG CAPS Take 2,000 mg by mouth daily.    [provider]  pantoprazole (PROTONIX) 40 MG tablet Take 1 tablet (40 mg total) by mouth daily. 05/23/23 08/21/23  Dorcas Carrow, MD  pioglitazone (ACTOS) 45 MG tablet Take 45 mg by mouth daily. 05/06/23   [provider]  traZODone (DESYREL) 100 MG tablet  Take 100 mg by mouth at bedtime.    [provider]      Allergies    Patient has no known allergies.    Review of Systems   Review of Systems  Constitutional:        Fall  Skin:  Positive for wound.  All other systems reviewed and are negative.   Physical Exam Updated Vital Signs BP (!) 156/55 (BP Location: Right Arm)   Pulse (!) 52   Temp (!) 97 F (36.1 C)   Resp 18   SpO2 95%  Physical Exam Vitals and nursing note reviewed.  Constitutional:      General: He is not in acute distress.    Appearance: Normal appearance.  HENT:     Head: Normocephalic.     Comments: Small hematoma at occipital region of head    Mouth/Throat:      Mouth: Mucous membranes are moist.  Eyes:     Extraocular Movements: Extraocular movements intact.     Conjunctiva/sclera: Conjunctivae normal.     Pupils: Pupils are equal, round, and reactive to light.  Cardiovascular:     Rate and Rhythm: Normal rate and regular rhythm.     Pulses: Normal pulses.  Pulmonary:     Effort: Pulmonary effort is normal.     Breath sounds: Normal breath sounds.  Abdominal:     Palpations: Abdomen is soft.     Tenderness: There is no abdominal tenderness.  Musculoskeletal:     Comments: Tender present at the sacrum, otherwise no midline tenderness to cervical, thoracic, or lumbar spine.  Upper and lower extremity strength and sensation intact bilaterally.  Skin:    General: Skin is warm and dry.     Findings: No rash.     Comments: Skin tear at the right forearm and left index finger  Neurological:     General: No focal deficit present.     Mental Status: He is alert.     Sensory: No sensory deficit.     Motor: No weakness.  Psychiatric:        Mood and Affect: Mood normal.        Behavior: Behavior normal.    ED Results / Procedures / Treatments   Labs (all labs ordered are listed, but only abnormal results are displayed) Labs Reviewed - No data to display  EKG None  Radiology No results found.  Procedures Procedures  {Document cardiac monitor, telemetry assessment procedure when appropriate:1}  Medications Ordered in ED Medications - No data to display  ED Course/ Medical Decision Making/ A&P   {   Click here for ABCD2, HEART and other calculatorsREFRESH Note before signing :1}                              Medical Decision Making Amount and/or Complexity of Data Reviewed Radiology: ordered.   This patient presents to the ED for concern of fall, this involves an extensive number of treatment options, and is a complaint that carries with it a high risk of complications and morbidity.   Differential diagnosis includes: SAH, SDH,  concussion, contusion,  fracture, dislocation, abrasion vs laceration, etc.   Comorbidities  See HPI above   Additional History  Additional history obtained from previous records.   Cardiac Monitoring / EKG  The patient was maintained on a cardiac monitor.  I personally viewed and interpreted the cardiac monitored which showed: *** with a heart  rate of *** bpm.   Lab Tests  I ordered and personally interpreted labs.  The pertinent results include:  ***   Imaging Studies  I ordered imaging studies including ***  I independently visualized and interpreted imaging which showed: *** I agree with the radiologist interpretation   Consultations  I requested consultation with ***,  and discussed lab and imaging findings as well as pertinent plan - they recommend: ***   Problem List / ED Course / Critical Interventions / Medication Management  Fall, scalp hematoma, sacral pain, skin tears I ordered medications including: *** for ***  Reevaluation of the patient after these medicines showed that the patient {resolved/improved/worsened:23923::"improved"} I have reviewed the patients home medicines and have made adjustments as needed   Social Determinants of Health  Physical activity   Test / Admission - Considered  ***   {Document critical care time when appropriate:1} {Document review of labs and clinical decision tools ie heart score, Chads2Vasc2 etc:1}  {Document your independent review of radiology images, and any outside records:1} {Document your discussion with family members, caretakers, and with consultants:1} {Document social determinants of health affecting pt's care:1} {Document your decision making why or why not admission, treatments were needed:1} Final Clinical Impression(s) / ED Diagnoses Final diagnoses:  None    Rx / DC Orders ED Discharge Orders     None

## 2023-08-13 NOTE — ED Notes (Signed)
Patient transported to CT 

## 2023-08-13 NOTE — Discharge Instructions (Signed)
As discussed, your imaging does not show signs of head injury, fractures, or dislocations. You can take Tylenol as needed for pain.  Referral for home health has been sent.  They will reach out to you next couple days to schedule a visit.  Follow-up with your PCP in 3 to 5 days for reevaluation of your symptoms.  Return to the ED if your symptoms worsen in the interim.

## 2023-08-14 DIAGNOSIS — W19XXXA Unspecified fall, initial encounter: Secondary | ICD-10-CM | POA: Diagnosis not present

## 2023-08-14 DIAGNOSIS — M79642 Pain in left hand: Secondary | ICD-10-CM | POA: Diagnosis not present

## 2023-08-19 DIAGNOSIS — S61205D Unspecified open wound of left ring finger without damage to nail, subsequent encounter: Secondary | ICD-10-CM | POA: Diagnosis not present

## 2023-08-19 DIAGNOSIS — E1122 Type 2 diabetes mellitus with diabetic chronic kidney disease: Secondary | ICD-10-CM | POA: Diagnosis not present

## 2023-08-19 DIAGNOSIS — I480 Paroxysmal atrial fibrillation: Secondary | ICD-10-CM | POA: Diagnosis not present

## 2023-08-19 DIAGNOSIS — I129 Hypertensive chronic kidney disease with stage 1 through stage 4 chronic kidney disease, or unspecified chronic kidney disease: Secondary | ICD-10-CM | POA: Diagnosis not present

## 2023-08-19 DIAGNOSIS — S61203D Unspecified open wound of left middle finger without damage to nail, subsequent encounter: Secondary | ICD-10-CM | POA: Diagnosis not present

## 2023-08-19 DIAGNOSIS — I2510 Atherosclerotic heart disease of native coronary artery without angina pectoris: Secondary | ICD-10-CM | POA: Diagnosis not present

## 2023-08-19 DIAGNOSIS — N1831 Chronic kidney disease, stage 3a: Secondary | ICD-10-CM | POA: Diagnosis not present

## 2023-08-19 DIAGNOSIS — S51801D Unspecified open wound of right forearm, subsequent encounter: Secondary | ICD-10-CM | POA: Diagnosis not present

## 2023-08-19 DIAGNOSIS — E1142 Type 2 diabetes mellitus with diabetic polyneuropathy: Secondary | ICD-10-CM | POA: Diagnosis not present

## 2023-08-20 DIAGNOSIS — S61205D Unspecified open wound of left ring finger without damage to nail, subsequent encounter: Secondary | ICD-10-CM | POA: Diagnosis not present

## 2023-08-20 DIAGNOSIS — S51801D Unspecified open wound of right forearm, subsequent encounter: Secondary | ICD-10-CM | POA: Diagnosis not present

## 2023-08-20 DIAGNOSIS — S61203D Unspecified open wound of left middle finger without damage to nail, subsequent encounter: Secondary | ICD-10-CM | POA: Diagnosis not present

## 2023-08-20 DIAGNOSIS — I480 Paroxysmal atrial fibrillation: Secondary | ICD-10-CM | POA: Diagnosis not present

## 2023-08-20 DIAGNOSIS — I129 Hypertensive chronic kidney disease with stage 1 through stage 4 chronic kidney disease, or unspecified chronic kidney disease: Secondary | ICD-10-CM | POA: Diagnosis not present

## 2023-08-20 DIAGNOSIS — E1142 Type 2 diabetes mellitus with diabetic polyneuropathy: Secondary | ICD-10-CM | POA: Diagnosis not present

## 2023-08-20 DIAGNOSIS — E1122 Type 2 diabetes mellitus with diabetic chronic kidney disease: Secondary | ICD-10-CM | POA: Diagnosis not present

## 2023-08-20 DIAGNOSIS — I2510 Atherosclerotic heart disease of native coronary artery without angina pectoris: Secondary | ICD-10-CM | POA: Diagnosis not present

## 2023-08-20 DIAGNOSIS — N1831 Chronic kidney disease, stage 3a: Secondary | ICD-10-CM | POA: Diagnosis not present

## 2023-08-21 DIAGNOSIS — S61205D Unspecified open wound of left ring finger without damage to nail, subsequent encounter: Secondary | ICD-10-CM | POA: Diagnosis not present

## 2023-08-21 DIAGNOSIS — E1142 Type 2 diabetes mellitus with diabetic polyneuropathy: Secondary | ICD-10-CM | POA: Diagnosis not present

## 2023-08-21 DIAGNOSIS — I2510 Atherosclerotic heart disease of native coronary artery without angina pectoris: Secondary | ICD-10-CM | POA: Diagnosis not present

## 2023-08-21 DIAGNOSIS — S51801D Unspecified open wound of right forearm, subsequent encounter: Secondary | ICD-10-CM | POA: Diagnosis not present

## 2023-08-21 DIAGNOSIS — I480 Paroxysmal atrial fibrillation: Secondary | ICD-10-CM | POA: Diagnosis not present

## 2023-08-21 DIAGNOSIS — W19XXXD Unspecified fall, subsequent encounter: Secondary | ICD-10-CM | POA: Diagnosis not present

## 2023-08-21 DIAGNOSIS — N1831 Chronic kidney disease, stage 3a: Secondary | ICD-10-CM | POA: Diagnosis not present

## 2023-08-21 DIAGNOSIS — E1122 Type 2 diabetes mellitus with diabetic chronic kidney disease: Secondary | ICD-10-CM | POA: Diagnosis not present

## 2023-08-21 DIAGNOSIS — S61203D Unspecified open wound of left middle finger without damage to nail, subsequent encounter: Secondary | ICD-10-CM | POA: Diagnosis not present

## 2023-08-21 DIAGNOSIS — I129 Hypertensive chronic kidney disease with stage 1 through stage 4 chronic kidney disease, or unspecified chronic kidney disease: Secondary | ICD-10-CM | POA: Diagnosis not present

## 2023-08-21 DIAGNOSIS — T148XXA Other injury of unspecified body region, initial encounter: Secondary | ICD-10-CM | POA: Diagnosis not present

## 2023-08-22 DIAGNOSIS — I129 Hypertensive chronic kidney disease with stage 1 through stage 4 chronic kidney disease, or unspecified chronic kidney disease: Secondary | ICD-10-CM | POA: Diagnosis not present

## 2023-08-22 DIAGNOSIS — S51801D Unspecified open wound of right forearm, subsequent encounter: Secondary | ICD-10-CM | POA: Diagnosis not present

## 2023-08-22 DIAGNOSIS — I2510 Atherosclerotic heart disease of native coronary artery without angina pectoris: Secondary | ICD-10-CM | POA: Diagnosis not present

## 2023-08-22 DIAGNOSIS — S61203D Unspecified open wound of left middle finger without damage to nail, subsequent encounter: Secondary | ICD-10-CM | POA: Diagnosis not present

## 2023-08-22 DIAGNOSIS — N1831 Chronic kidney disease, stage 3a: Secondary | ICD-10-CM | POA: Diagnosis not present

## 2023-08-22 DIAGNOSIS — E1122 Type 2 diabetes mellitus with diabetic chronic kidney disease: Secondary | ICD-10-CM | POA: Diagnosis not present

## 2023-08-22 DIAGNOSIS — S61205D Unspecified open wound of left ring finger without damage to nail, subsequent encounter: Secondary | ICD-10-CM | POA: Diagnosis not present

## 2023-08-22 DIAGNOSIS — I480 Paroxysmal atrial fibrillation: Secondary | ICD-10-CM | POA: Diagnosis not present

## 2023-08-22 DIAGNOSIS — E1142 Type 2 diabetes mellitus with diabetic polyneuropathy: Secondary | ICD-10-CM | POA: Diagnosis not present

## 2023-08-25 DIAGNOSIS — I2510 Atherosclerotic heart disease of native coronary artery without angina pectoris: Secondary | ICD-10-CM | POA: Diagnosis not present

## 2023-08-25 DIAGNOSIS — N1831 Chronic kidney disease, stage 3a: Secondary | ICD-10-CM | POA: Diagnosis not present

## 2023-08-25 DIAGNOSIS — I480 Paroxysmal atrial fibrillation: Secondary | ICD-10-CM | POA: Diagnosis not present

## 2023-08-25 DIAGNOSIS — E1142 Type 2 diabetes mellitus with diabetic polyneuropathy: Secondary | ICD-10-CM | POA: Diagnosis not present

## 2023-08-25 DIAGNOSIS — S61205D Unspecified open wound of left ring finger without damage to nail, subsequent encounter: Secondary | ICD-10-CM | POA: Diagnosis not present

## 2023-08-25 DIAGNOSIS — I129 Hypertensive chronic kidney disease with stage 1 through stage 4 chronic kidney disease, or unspecified chronic kidney disease: Secondary | ICD-10-CM | POA: Diagnosis not present

## 2023-08-25 DIAGNOSIS — E1122 Type 2 diabetes mellitus with diabetic chronic kidney disease: Secondary | ICD-10-CM | POA: Diagnosis not present

## 2023-08-25 DIAGNOSIS — S51801D Unspecified open wound of right forearm, subsequent encounter: Secondary | ICD-10-CM | POA: Diagnosis not present

## 2023-08-25 DIAGNOSIS — S61203D Unspecified open wound of left middle finger without damage to nail, subsequent encounter: Secondary | ICD-10-CM | POA: Diagnosis not present

## 2023-08-26 DIAGNOSIS — N1831 Chronic kidney disease, stage 3a: Secondary | ICD-10-CM | POA: Diagnosis not present

## 2023-08-26 DIAGNOSIS — S61205D Unspecified open wound of left ring finger without damage to nail, subsequent encounter: Secondary | ICD-10-CM | POA: Diagnosis not present

## 2023-08-26 DIAGNOSIS — I480 Paroxysmal atrial fibrillation: Secondary | ICD-10-CM | POA: Diagnosis not present

## 2023-08-26 DIAGNOSIS — I2510 Atherosclerotic heart disease of native coronary artery without angina pectoris: Secondary | ICD-10-CM | POA: Diagnosis not present

## 2023-08-26 DIAGNOSIS — E1122 Type 2 diabetes mellitus with diabetic chronic kidney disease: Secondary | ICD-10-CM | POA: Diagnosis not present

## 2023-08-26 DIAGNOSIS — E1142 Type 2 diabetes mellitus with diabetic polyneuropathy: Secondary | ICD-10-CM | POA: Diagnosis not present

## 2023-08-26 DIAGNOSIS — S61203D Unspecified open wound of left middle finger without damage to nail, subsequent encounter: Secondary | ICD-10-CM | POA: Diagnosis not present

## 2023-08-26 DIAGNOSIS — S51801D Unspecified open wound of right forearm, subsequent encounter: Secondary | ICD-10-CM | POA: Diagnosis not present

## 2023-08-26 DIAGNOSIS — I129 Hypertensive chronic kidney disease with stage 1 through stage 4 chronic kidney disease, or unspecified chronic kidney disease: Secondary | ICD-10-CM | POA: Diagnosis not present

## 2023-08-28 DIAGNOSIS — I2510 Atherosclerotic heart disease of native coronary artery without angina pectoris: Secondary | ICD-10-CM | POA: Diagnosis not present

## 2023-08-28 DIAGNOSIS — S51801D Unspecified open wound of right forearm, subsequent encounter: Secondary | ICD-10-CM | POA: Diagnosis not present

## 2023-08-28 DIAGNOSIS — I480 Paroxysmal atrial fibrillation: Secondary | ICD-10-CM | POA: Diagnosis not present

## 2023-08-28 DIAGNOSIS — E1142 Type 2 diabetes mellitus with diabetic polyneuropathy: Secondary | ICD-10-CM | POA: Diagnosis not present

## 2023-08-28 DIAGNOSIS — N1831 Chronic kidney disease, stage 3a: Secondary | ICD-10-CM | POA: Diagnosis not present

## 2023-08-28 DIAGNOSIS — I129 Hypertensive chronic kidney disease with stage 1 through stage 4 chronic kidney disease, or unspecified chronic kidney disease: Secondary | ICD-10-CM | POA: Diagnosis not present

## 2023-08-28 DIAGNOSIS — E1122 Type 2 diabetes mellitus with diabetic chronic kidney disease: Secondary | ICD-10-CM | POA: Diagnosis not present

## 2023-08-28 DIAGNOSIS — S61205D Unspecified open wound of left ring finger without damage to nail, subsequent encounter: Secondary | ICD-10-CM | POA: Diagnosis not present

## 2023-08-28 DIAGNOSIS — S61203D Unspecified open wound of left middle finger without damage to nail, subsequent encounter: Secondary | ICD-10-CM | POA: Diagnosis not present

## 2023-08-29 DIAGNOSIS — S61203D Unspecified open wound of left middle finger without damage to nail, subsequent encounter: Secondary | ICD-10-CM | POA: Diagnosis not present

## 2023-08-29 DIAGNOSIS — I129 Hypertensive chronic kidney disease with stage 1 through stage 4 chronic kidney disease, or unspecified chronic kidney disease: Secondary | ICD-10-CM | POA: Diagnosis not present

## 2023-08-29 DIAGNOSIS — N1831 Chronic kidney disease, stage 3a: Secondary | ICD-10-CM | POA: Diagnosis not present

## 2023-08-29 DIAGNOSIS — S61205D Unspecified open wound of left ring finger without damage to nail, subsequent encounter: Secondary | ICD-10-CM | POA: Diagnosis not present

## 2023-08-29 DIAGNOSIS — I480 Paroxysmal atrial fibrillation: Secondary | ICD-10-CM | POA: Diagnosis not present

## 2023-08-29 DIAGNOSIS — E1122 Type 2 diabetes mellitus with diabetic chronic kidney disease: Secondary | ICD-10-CM | POA: Diagnosis not present

## 2023-08-29 DIAGNOSIS — S51801D Unspecified open wound of right forearm, subsequent encounter: Secondary | ICD-10-CM | POA: Diagnosis not present

## 2023-08-29 DIAGNOSIS — E1142 Type 2 diabetes mellitus with diabetic polyneuropathy: Secondary | ICD-10-CM | POA: Diagnosis not present

## 2023-08-29 DIAGNOSIS — I2510 Atherosclerotic heart disease of native coronary artery without angina pectoris: Secondary | ICD-10-CM | POA: Diagnosis not present

## 2023-09-01 DIAGNOSIS — I129 Hypertensive chronic kidney disease with stage 1 through stage 4 chronic kidney disease, or unspecified chronic kidney disease: Secondary | ICD-10-CM | POA: Diagnosis not present

## 2023-09-01 DIAGNOSIS — N1831 Chronic kidney disease, stage 3a: Secondary | ICD-10-CM | POA: Diagnosis not present

## 2023-09-01 DIAGNOSIS — S61203D Unspecified open wound of left middle finger without damage to nail, subsequent encounter: Secondary | ICD-10-CM | POA: Diagnosis not present

## 2023-09-01 DIAGNOSIS — S51801D Unspecified open wound of right forearm, subsequent encounter: Secondary | ICD-10-CM | POA: Diagnosis not present

## 2023-09-01 DIAGNOSIS — I480 Paroxysmal atrial fibrillation: Secondary | ICD-10-CM | POA: Diagnosis not present

## 2023-09-01 DIAGNOSIS — S61205D Unspecified open wound of left ring finger without damage to nail, subsequent encounter: Secondary | ICD-10-CM | POA: Diagnosis not present

## 2023-09-01 DIAGNOSIS — E1122 Type 2 diabetes mellitus with diabetic chronic kidney disease: Secondary | ICD-10-CM | POA: Diagnosis not present

## 2023-09-01 DIAGNOSIS — E1142 Type 2 diabetes mellitus with diabetic polyneuropathy: Secondary | ICD-10-CM | POA: Diagnosis not present

## 2023-09-01 DIAGNOSIS — I2510 Atherosclerotic heart disease of native coronary artery without angina pectoris: Secondary | ICD-10-CM | POA: Diagnosis not present

## 2023-09-02 DIAGNOSIS — E1122 Type 2 diabetes mellitus with diabetic chronic kidney disease: Secondary | ICD-10-CM | POA: Diagnosis not present

## 2023-09-02 DIAGNOSIS — N1831 Chronic kidney disease, stage 3a: Secondary | ICD-10-CM | POA: Diagnosis not present

## 2023-09-02 DIAGNOSIS — S51801D Unspecified open wound of right forearm, subsequent encounter: Secondary | ICD-10-CM | POA: Diagnosis not present

## 2023-09-02 DIAGNOSIS — S61203D Unspecified open wound of left middle finger without damage to nail, subsequent encounter: Secondary | ICD-10-CM | POA: Diagnosis not present

## 2023-09-02 DIAGNOSIS — I480 Paroxysmal atrial fibrillation: Secondary | ICD-10-CM | POA: Diagnosis not present

## 2023-09-02 DIAGNOSIS — E1142 Type 2 diabetes mellitus with diabetic polyneuropathy: Secondary | ICD-10-CM | POA: Diagnosis not present

## 2023-09-02 DIAGNOSIS — I129 Hypertensive chronic kidney disease with stage 1 through stage 4 chronic kidney disease, or unspecified chronic kidney disease: Secondary | ICD-10-CM | POA: Diagnosis not present

## 2023-09-02 DIAGNOSIS — I2510 Atherosclerotic heart disease of native coronary artery without angina pectoris: Secondary | ICD-10-CM | POA: Diagnosis not present

## 2023-09-02 DIAGNOSIS — S61205D Unspecified open wound of left ring finger without damage to nail, subsequent encounter: Secondary | ICD-10-CM | POA: Diagnosis not present

## 2023-09-04 DIAGNOSIS — S61205D Unspecified open wound of left ring finger without damage to nail, subsequent encounter: Secondary | ICD-10-CM | POA: Diagnosis not present

## 2023-09-04 DIAGNOSIS — E1142 Type 2 diabetes mellitus with diabetic polyneuropathy: Secondary | ICD-10-CM | POA: Diagnosis not present

## 2023-09-04 DIAGNOSIS — E1122 Type 2 diabetes mellitus with diabetic chronic kidney disease: Secondary | ICD-10-CM | POA: Diagnosis not present

## 2023-09-04 DIAGNOSIS — S61203D Unspecified open wound of left middle finger without damage to nail, subsequent encounter: Secondary | ICD-10-CM | POA: Diagnosis not present

## 2023-09-04 DIAGNOSIS — I480 Paroxysmal atrial fibrillation: Secondary | ICD-10-CM | POA: Diagnosis not present

## 2023-09-04 DIAGNOSIS — S51801D Unspecified open wound of right forearm, subsequent encounter: Secondary | ICD-10-CM | POA: Diagnosis not present

## 2023-09-04 DIAGNOSIS — N1831 Chronic kidney disease, stage 3a: Secondary | ICD-10-CM | POA: Diagnosis not present

## 2023-09-04 DIAGNOSIS — I2510 Atherosclerotic heart disease of native coronary artery without angina pectoris: Secondary | ICD-10-CM | POA: Diagnosis not present

## 2023-09-04 DIAGNOSIS — I129 Hypertensive chronic kidney disease with stage 1 through stage 4 chronic kidney disease, or unspecified chronic kidney disease: Secondary | ICD-10-CM | POA: Diagnosis not present

## 2023-09-08 DIAGNOSIS — S61205D Unspecified open wound of left ring finger without damage to nail, subsequent encounter: Secondary | ICD-10-CM | POA: Diagnosis not present

## 2023-09-08 DIAGNOSIS — I2510 Atherosclerotic heart disease of native coronary artery without angina pectoris: Secondary | ICD-10-CM | POA: Diagnosis not present

## 2023-09-08 DIAGNOSIS — N1831 Chronic kidney disease, stage 3a: Secondary | ICD-10-CM | POA: Diagnosis not present

## 2023-09-08 DIAGNOSIS — I480 Paroxysmal atrial fibrillation: Secondary | ICD-10-CM | POA: Diagnosis not present

## 2023-09-08 DIAGNOSIS — I129 Hypertensive chronic kidney disease with stage 1 through stage 4 chronic kidney disease, or unspecified chronic kidney disease: Secondary | ICD-10-CM | POA: Diagnosis not present

## 2023-09-08 DIAGNOSIS — E1142 Type 2 diabetes mellitus with diabetic polyneuropathy: Secondary | ICD-10-CM | POA: Diagnosis not present

## 2023-09-08 DIAGNOSIS — E1122 Type 2 diabetes mellitus with diabetic chronic kidney disease: Secondary | ICD-10-CM | POA: Diagnosis not present

## 2023-09-08 DIAGNOSIS — S61203D Unspecified open wound of left middle finger without damage to nail, subsequent encounter: Secondary | ICD-10-CM | POA: Diagnosis not present

## 2023-09-08 DIAGNOSIS — S51801D Unspecified open wound of right forearm, subsequent encounter: Secondary | ICD-10-CM | POA: Diagnosis not present

## 2023-09-09 DIAGNOSIS — S61205D Unspecified open wound of left ring finger without damage to nail, subsequent encounter: Secondary | ICD-10-CM | POA: Diagnosis not present

## 2023-09-09 DIAGNOSIS — E1122 Type 2 diabetes mellitus with diabetic chronic kidney disease: Secondary | ICD-10-CM | POA: Diagnosis not present

## 2023-09-09 DIAGNOSIS — I129 Hypertensive chronic kidney disease with stage 1 through stage 4 chronic kidney disease, or unspecified chronic kidney disease: Secondary | ICD-10-CM | POA: Diagnosis not present

## 2023-09-09 DIAGNOSIS — E1142 Type 2 diabetes mellitus with diabetic polyneuropathy: Secondary | ICD-10-CM | POA: Diagnosis not present

## 2023-09-09 DIAGNOSIS — N1831 Chronic kidney disease, stage 3a: Secondary | ICD-10-CM | POA: Diagnosis not present

## 2023-09-09 DIAGNOSIS — I2510 Atherosclerotic heart disease of native coronary artery without angina pectoris: Secondary | ICD-10-CM | POA: Diagnosis not present

## 2023-09-09 DIAGNOSIS — S51801D Unspecified open wound of right forearm, subsequent encounter: Secondary | ICD-10-CM | POA: Diagnosis not present

## 2023-09-09 DIAGNOSIS — S61203D Unspecified open wound of left middle finger without damage to nail, subsequent encounter: Secondary | ICD-10-CM | POA: Diagnosis not present

## 2023-09-09 DIAGNOSIS — I480 Paroxysmal atrial fibrillation: Secondary | ICD-10-CM | POA: Diagnosis not present

## 2023-09-11 DIAGNOSIS — N1831 Chronic kidney disease, stage 3a: Secondary | ICD-10-CM | POA: Diagnosis not present

## 2023-09-11 DIAGNOSIS — I129 Hypertensive chronic kidney disease with stage 1 through stage 4 chronic kidney disease, or unspecified chronic kidney disease: Secondary | ICD-10-CM | POA: Diagnosis not present

## 2023-09-11 DIAGNOSIS — S61203D Unspecified open wound of left middle finger without damage to nail, subsequent encounter: Secondary | ICD-10-CM | POA: Diagnosis not present

## 2023-09-11 DIAGNOSIS — E1122 Type 2 diabetes mellitus with diabetic chronic kidney disease: Secondary | ICD-10-CM | POA: Diagnosis not present

## 2023-09-11 DIAGNOSIS — I480 Paroxysmal atrial fibrillation: Secondary | ICD-10-CM | POA: Diagnosis not present

## 2023-09-11 DIAGNOSIS — S51801D Unspecified open wound of right forearm, subsequent encounter: Secondary | ICD-10-CM | POA: Diagnosis not present

## 2023-09-11 DIAGNOSIS — E1142 Type 2 diabetes mellitus with diabetic polyneuropathy: Secondary | ICD-10-CM | POA: Diagnosis not present

## 2023-09-11 DIAGNOSIS — S61205D Unspecified open wound of left ring finger without damage to nail, subsequent encounter: Secondary | ICD-10-CM | POA: Diagnosis not present

## 2023-09-11 DIAGNOSIS — I2510 Atherosclerotic heart disease of native coronary artery without angina pectoris: Secondary | ICD-10-CM | POA: Diagnosis not present

## 2023-09-15 DIAGNOSIS — S61203D Unspecified open wound of left middle finger without damage to nail, subsequent encounter: Secondary | ICD-10-CM | POA: Diagnosis not present

## 2023-09-15 DIAGNOSIS — E1122 Type 2 diabetes mellitus with diabetic chronic kidney disease: Secondary | ICD-10-CM | POA: Diagnosis not present

## 2023-09-15 DIAGNOSIS — N1831 Chronic kidney disease, stage 3a: Secondary | ICD-10-CM | POA: Diagnosis not present

## 2023-09-15 DIAGNOSIS — I129 Hypertensive chronic kidney disease with stage 1 through stage 4 chronic kidney disease, or unspecified chronic kidney disease: Secondary | ICD-10-CM | POA: Diagnosis not present

## 2023-09-15 DIAGNOSIS — S51801D Unspecified open wound of right forearm, subsequent encounter: Secondary | ICD-10-CM | POA: Diagnosis not present

## 2023-09-15 DIAGNOSIS — I2510 Atherosclerotic heart disease of native coronary artery without angina pectoris: Secondary | ICD-10-CM | POA: Diagnosis not present

## 2023-09-15 DIAGNOSIS — S61205D Unspecified open wound of left ring finger without damage to nail, subsequent encounter: Secondary | ICD-10-CM | POA: Diagnosis not present

## 2023-09-15 DIAGNOSIS — E1142 Type 2 diabetes mellitus with diabetic polyneuropathy: Secondary | ICD-10-CM | POA: Diagnosis not present

## 2023-09-15 DIAGNOSIS — I480 Paroxysmal atrial fibrillation: Secondary | ICD-10-CM | POA: Diagnosis not present

## 2023-09-16 DIAGNOSIS — I480 Paroxysmal atrial fibrillation: Secondary | ICD-10-CM | POA: Diagnosis not present

## 2023-09-16 DIAGNOSIS — E1122 Type 2 diabetes mellitus with diabetic chronic kidney disease: Secondary | ICD-10-CM | POA: Diagnosis not present

## 2023-09-16 DIAGNOSIS — I2510 Atherosclerotic heart disease of native coronary artery without angina pectoris: Secondary | ICD-10-CM | POA: Diagnosis not present

## 2023-09-16 DIAGNOSIS — S61205D Unspecified open wound of left ring finger without damage to nail, subsequent encounter: Secondary | ICD-10-CM | POA: Diagnosis not present

## 2023-09-16 DIAGNOSIS — S61203D Unspecified open wound of left middle finger without damage to nail, subsequent encounter: Secondary | ICD-10-CM | POA: Diagnosis not present

## 2023-09-16 DIAGNOSIS — E1142 Type 2 diabetes mellitus with diabetic polyneuropathy: Secondary | ICD-10-CM | POA: Diagnosis not present

## 2023-09-16 DIAGNOSIS — S51801D Unspecified open wound of right forearm, subsequent encounter: Secondary | ICD-10-CM | POA: Diagnosis not present

## 2023-09-16 DIAGNOSIS — I129 Hypertensive chronic kidney disease with stage 1 through stage 4 chronic kidney disease, or unspecified chronic kidney disease: Secondary | ICD-10-CM | POA: Diagnosis not present

## 2023-09-16 DIAGNOSIS — N1831 Chronic kidney disease, stage 3a: Secondary | ICD-10-CM | POA: Diagnosis not present

## 2023-09-22 DIAGNOSIS — I129 Hypertensive chronic kidney disease with stage 1 through stage 4 chronic kidney disease, or unspecified chronic kidney disease: Secondary | ICD-10-CM | POA: Diagnosis not present

## 2023-09-22 DIAGNOSIS — E1122 Type 2 diabetes mellitus with diabetic chronic kidney disease: Secondary | ICD-10-CM | POA: Diagnosis not present

## 2023-09-22 DIAGNOSIS — I2510 Atherosclerotic heart disease of native coronary artery without angina pectoris: Secondary | ICD-10-CM | POA: Diagnosis not present

## 2023-09-22 DIAGNOSIS — S61203D Unspecified open wound of left middle finger without damage to nail, subsequent encounter: Secondary | ICD-10-CM | POA: Diagnosis not present

## 2023-09-22 DIAGNOSIS — N1831 Chronic kidney disease, stage 3a: Secondary | ICD-10-CM | POA: Diagnosis not present

## 2023-09-22 DIAGNOSIS — E1142 Type 2 diabetes mellitus with diabetic polyneuropathy: Secondary | ICD-10-CM | POA: Diagnosis not present

## 2023-09-22 DIAGNOSIS — S51801D Unspecified open wound of right forearm, subsequent encounter: Secondary | ICD-10-CM | POA: Diagnosis not present

## 2023-09-22 DIAGNOSIS — I480 Paroxysmal atrial fibrillation: Secondary | ICD-10-CM | POA: Diagnosis not present

## 2023-09-22 DIAGNOSIS — S61205D Unspecified open wound of left ring finger without damage to nail, subsequent encounter: Secondary | ICD-10-CM | POA: Diagnosis not present

## 2023-09-23 DIAGNOSIS — S61203D Unspecified open wound of left middle finger without damage to nail, subsequent encounter: Secondary | ICD-10-CM | POA: Diagnosis not present

## 2023-09-23 DIAGNOSIS — S61205D Unspecified open wound of left ring finger without damage to nail, subsequent encounter: Secondary | ICD-10-CM | POA: Diagnosis not present

## 2023-09-23 DIAGNOSIS — E1122 Type 2 diabetes mellitus with diabetic chronic kidney disease: Secondary | ICD-10-CM | POA: Diagnosis not present

## 2023-09-23 DIAGNOSIS — E1142 Type 2 diabetes mellitus with diabetic polyneuropathy: Secondary | ICD-10-CM | POA: Diagnosis not present

## 2023-09-23 DIAGNOSIS — I480 Paroxysmal atrial fibrillation: Secondary | ICD-10-CM | POA: Diagnosis not present

## 2023-09-23 DIAGNOSIS — I2510 Atherosclerotic heart disease of native coronary artery without angina pectoris: Secondary | ICD-10-CM | POA: Diagnosis not present

## 2023-09-23 DIAGNOSIS — S51801D Unspecified open wound of right forearm, subsequent encounter: Secondary | ICD-10-CM | POA: Diagnosis not present

## 2023-09-23 DIAGNOSIS — N1831 Chronic kidney disease, stage 3a: Secondary | ICD-10-CM | POA: Diagnosis not present

## 2023-09-23 DIAGNOSIS — I129 Hypertensive chronic kidney disease with stage 1 through stage 4 chronic kidney disease, or unspecified chronic kidney disease: Secondary | ICD-10-CM | POA: Diagnosis not present

## 2023-09-25 DIAGNOSIS — E1122 Type 2 diabetes mellitus with diabetic chronic kidney disease: Secondary | ICD-10-CM | POA: Diagnosis not present

## 2023-09-25 DIAGNOSIS — I480 Paroxysmal atrial fibrillation: Secondary | ICD-10-CM | POA: Diagnosis not present

## 2023-09-25 DIAGNOSIS — N1831 Chronic kidney disease, stage 3a: Secondary | ICD-10-CM | POA: Diagnosis not present

## 2023-09-25 DIAGNOSIS — E1142 Type 2 diabetes mellitus with diabetic polyneuropathy: Secondary | ICD-10-CM | POA: Diagnosis not present

## 2023-09-25 DIAGNOSIS — S61205D Unspecified open wound of left ring finger without damage to nail, subsequent encounter: Secondary | ICD-10-CM | POA: Diagnosis not present

## 2023-09-25 DIAGNOSIS — S61203D Unspecified open wound of left middle finger without damage to nail, subsequent encounter: Secondary | ICD-10-CM | POA: Diagnosis not present

## 2023-09-25 DIAGNOSIS — I2510 Atherosclerotic heart disease of native coronary artery without angina pectoris: Secondary | ICD-10-CM | POA: Diagnosis not present

## 2023-09-25 DIAGNOSIS — I129 Hypertensive chronic kidney disease with stage 1 through stage 4 chronic kidney disease, or unspecified chronic kidney disease: Secondary | ICD-10-CM | POA: Diagnosis not present

## 2023-09-25 DIAGNOSIS — S51801D Unspecified open wound of right forearm, subsequent encounter: Secondary | ICD-10-CM | POA: Diagnosis not present

## 2023-09-29 DIAGNOSIS — I480 Paroxysmal atrial fibrillation: Secondary | ICD-10-CM | POA: Diagnosis not present

## 2023-09-29 DIAGNOSIS — N1831 Chronic kidney disease, stage 3a: Secondary | ICD-10-CM | POA: Diagnosis not present

## 2023-09-29 DIAGNOSIS — S51801D Unspecified open wound of right forearm, subsequent encounter: Secondary | ICD-10-CM | POA: Diagnosis not present

## 2023-09-29 DIAGNOSIS — E1142 Type 2 diabetes mellitus with diabetic polyneuropathy: Secondary | ICD-10-CM | POA: Diagnosis not present

## 2023-09-29 DIAGNOSIS — I129 Hypertensive chronic kidney disease with stage 1 through stage 4 chronic kidney disease, or unspecified chronic kidney disease: Secondary | ICD-10-CM | POA: Diagnosis not present

## 2023-09-29 DIAGNOSIS — S61205D Unspecified open wound of left ring finger without damage to nail, subsequent encounter: Secondary | ICD-10-CM | POA: Diagnosis not present

## 2023-09-29 DIAGNOSIS — I2510 Atherosclerotic heart disease of native coronary artery without angina pectoris: Secondary | ICD-10-CM | POA: Diagnosis not present

## 2023-09-29 DIAGNOSIS — E1122 Type 2 diabetes mellitus with diabetic chronic kidney disease: Secondary | ICD-10-CM | POA: Diagnosis not present

## 2023-09-29 DIAGNOSIS — S61203D Unspecified open wound of left middle finger without damage to nail, subsequent encounter: Secondary | ICD-10-CM | POA: Diagnosis not present

## 2023-09-30 DIAGNOSIS — I129 Hypertensive chronic kidney disease with stage 1 through stage 4 chronic kidney disease, or unspecified chronic kidney disease: Secondary | ICD-10-CM | POA: Diagnosis not present

## 2023-09-30 DIAGNOSIS — I480 Paroxysmal atrial fibrillation: Secondary | ICD-10-CM | POA: Diagnosis not present

## 2023-09-30 DIAGNOSIS — N1831 Chronic kidney disease, stage 3a: Secondary | ICD-10-CM | POA: Diagnosis not present

## 2023-09-30 DIAGNOSIS — S51801D Unspecified open wound of right forearm, subsequent encounter: Secondary | ICD-10-CM | POA: Diagnosis not present

## 2023-09-30 DIAGNOSIS — E1142 Type 2 diabetes mellitus with diabetic polyneuropathy: Secondary | ICD-10-CM | POA: Diagnosis not present

## 2023-09-30 DIAGNOSIS — I2510 Atherosclerotic heart disease of native coronary artery without angina pectoris: Secondary | ICD-10-CM | POA: Diagnosis not present

## 2023-09-30 DIAGNOSIS — S61203D Unspecified open wound of left middle finger without damage to nail, subsequent encounter: Secondary | ICD-10-CM | POA: Diagnosis not present

## 2023-09-30 DIAGNOSIS — S61205D Unspecified open wound of left ring finger without damage to nail, subsequent encounter: Secondary | ICD-10-CM | POA: Diagnosis not present

## 2023-09-30 DIAGNOSIS — E1122 Type 2 diabetes mellitus with diabetic chronic kidney disease: Secondary | ICD-10-CM | POA: Diagnosis not present

## 2023-10-01 DIAGNOSIS — I129 Hypertensive chronic kidney disease with stage 1 through stage 4 chronic kidney disease, or unspecified chronic kidney disease: Secondary | ICD-10-CM | POA: Diagnosis not present

## 2023-10-01 DIAGNOSIS — S61203D Unspecified open wound of left middle finger without damage to nail, subsequent encounter: Secondary | ICD-10-CM | POA: Diagnosis not present

## 2023-10-01 DIAGNOSIS — I2510 Atherosclerotic heart disease of native coronary artery without angina pectoris: Secondary | ICD-10-CM | POA: Diagnosis not present

## 2023-10-01 DIAGNOSIS — S51801D Unspecified open wound of right forearm, subsequent encounter: Secondary | ICD-10-CM | POA: Diagnosis not present

## 2023-10-01 DIAGNOSIS — E1142 Type 2 diabetes mellitus with diabetic polyneuropathy: Secondary | ICD-10-CM | POA: Diagnosis not present

## 2023-10-01 DIAGNOSIS — E1122 Type 2 diabetes mellitus with diabetic chronic kidney disease: Secondary | ICD-10-CM | POA: Diagnosis not present

## 2023-10-01 DIAGNOSIS — S61205D Unspecified open wound of left ring finger without damage to nail, subsequent encounter: Secondary | ICD-10-CM | POA: Diagnosis not present

## 2023-10-01 DIAGNOSIS — N1831 Chronic kidney disease, stage 3a: Secondary | ICD-10-CM | POA: Diagnosis not present

## 2023-10-01 DIAGNOSIS — I480 Paroxysmal atrial fibrillation: Secondary | ICD-10-CM | POA: Diagnosis not present

## 2023-10-02 DIAGNOSIS — S61203D Unspecified open wound of left middle finger without damage to nail, subsequent encounter: Secondary | ICD-10-CM | POA: Diagnosis not present

## 2023-10-02 DIAGNOSIS — S61205D Unspecified open wound of left ring finger without damage to nail, subsequent encounter: Secondary | ICD-10-CM | POA: Diagnosis not present

## 2023-10-02 DIAGNOSIS — N1831 Chronic kidney disease, stage 3a: Secondary | ICD-10-CM | POA: Diagnosis not present

## 2023-10-02 DIAGNOSIS — I2510 Atherosclerotic heart disease of native coronary artery without angina pectoris: Secondary | ICD-10-CM | POA: Diagnosis not present

## 2023-10-02 DIAGNOSIS — E1122 Type 2 diabetes mellitus with diabetic chronic kidney disease: Secondary | ICD-10-CM | POA: Diagnosis not present

## 2023-10-02 DIAGNOSIS — E1142 Type 2 diabetes mellitus with diabetic polyneuropathy: Secondary | ICD-10-CM | POA: Diagnosis not present

## 2023-10-02 DIAGNOSIS — I129 Hypertensive chronic kidney disease with stage 1 through stage 4 chronic kidney disease, or unspecified chronic kidney disease: Secondary | ICD-10-CM | POA: Diagnosis not present

## 2023-10-02 DIAGNOSIS — I480 Paroxysmal atrial fibrillation: Secondary | ICD-10-CM | POA: Diagnosis not present

## 2023-10-02 DIAGNOSIS — S51801D Unspecified open wound of right forearm, subsequent encounter: Secondary | ICD-10-CM | POA: Diagnosis not present

## 2023-10-06 DIAGNOSIS — S51801D Unspecified open wound of right forearm, subsequent encounter: Secondary | ICD-10-CM | POA: Diagnosis not present

## 2023-10-06 DIAGNOSIS — S61205D Unspecified open wound of left ring finger without damage to nail, subsequent encounter: Secondary | ICD-10-CM | POA: Diagnosis not present

## 2023-10-06 DIAGNOSIS — I2510 Atherosclerotic heart disease of native coronary artery without angina pectoris: Secondary | ICD-10-CM | POA: Diagnosis not present

## 2023-10-06 DIAGNOSIS — N1831 Chronic kidney disease, stage 3a: Secondary | ICD-10-CM | POA: Diagnosis not present

## 2023-10-06 DIAGNOSIS — I480 Paroxysmal atrial fibrillation: Secondary | ICD-10-CM | POA: Diagnosis not present

## 2023-10-06 DIAGNOSIS — I129 Hypertensive chronic kidney disease with stage 1 through stage 4 chronic kidney disease, or unspecified chronic kidney disease: Secondary | ICD-10-CM | POA: Diagnosis not present

## 2023-10-06 DIAGNOSIS — E1122 Type 2 diabetes mellitus with diabetic chronic kidney disease: Secondary | ICD-10-CM | POA: Diagnosis not present

## 2023-10-06 DIAGNOSIS — E1142 Type 2 diabetes mellitus with diabetic polyneuropathy: Secondary | ICD-10-CM | POA: Diagnosis not present

## 2023-10-06 DIAGNOSIS — S61203D Unspecified open wound of left middle finger without damage to nail, subsequent encounter: Secondary | ICD-10-CM | POA: Diagnosis not present

## 2023-10-07 DIAGNOSIS — I2510 Atherosclerotic heart disease of native coronary artery without angina pectoris: Secondary | ICD-10-CM | POA: Diagnosis not present

## 2023-10-07 DIAGNOSIS — E1122 Type 2 diabetes mellitus with diabetic chronic kidney disease: Secondary | ICD-10-CM | POA: Diagnosis not present

## 2023-10-07 DIAGNOSIS — S61205D Unspecified open wound of left ring finger without damage to nail, subsequent encounter: Secondary | ICD-10-CM | POA: Diagnosis not present

## 2023-10-07 DIAGNOSIS — I129 Hypertensive chronic kidney disease with stage 1 through stage 4 chronic kidney disease, or unspecified chronic kidney disease: Secondary | ICD-10-CM | POA: Diagnosis not present

## 2023-10-07 DIAGNOSIS — E1142 Type 2 diabetes mellitus with diabetic polyneuropathy: Secondary | ICD-10-CM | POA: Diagnosis not present

## 2023-10-07 DIAGNOSIS — N1831 Chronic kidney disease, stage 3a: Secondary | ICD-10-CM | POA: Diagnosis not present

## 2023-10-07 DIAGNOSIS — I480 Paroxysmal atrial fibrillation: Secondary | ICD-10-CM | POA: Diagnosis not present

## 2023-10-07 DIAGNOSIS — S61203D Unspecified open wound of left middle finger without damage to nail, subsequent encounter: Secondary | ICD-10-CM | POA: Diagnosis not present

## 2023-10-07 DIAGNOSIS — S51801D Unspecified open wound of right forearm, subsequent encounter: Secondary | ICD-10-CM | POA: Diagnosis not present

## 2023-10-09 DIAGNOSIS — E1122 Type 2 diabetes mellitus with diabetic chronic kidney disease: Secondary | ICD-10-CM | POA: Diagnosis not present

## 2023-10-09 DIAGNOSIS — I129 Hypertensive chronic kidney disease with stage 1 through stage 4 chronic kidney disease, or unspecified chronic kidney disease: Secondary | ICD-10-CM | POA: Diagnosis not present

## 2023-10-09 DIAGNOSIS — I480 Paroxysmal atrial fibrillation: Secondary | ICD-10-CM | POA: Diagnosis not present

## 2023-10-09 DIAGNOSIS — N1831 Chronic kidney disease, stage 3a: Secondary | ICD-10-CM | POA: Diagnosis not present

## 2023-10-09 DIAGNOSIS — S51801D Unspecified open wound of right forearm, subsequent encounter: Secondary | ICD-10-CM | POA: Diagnosis not present

## 2023-10-09 DIAGNOSIS — I2510 Atherosclerotic heart disease of native coronary artery without angina pectoris: Secondary | ICD-10-CM | POA: Diagnosis not present

## 2023-10-09 DIAGNOSIS — S61205D Unspecified open wound of left ring finger without damage to nail, subsequent encounter: Secondary | ICD-10-CM | POA: Diagnosis not present

## 2023-10-09 DIAGNOSIS — S61203D Unspecified open wound of left middle finger without damage to nail, subsequent encounter: Secondary | ICD-10-CM | POA: Diagnosis not present

## 2023-10-09 DIAGNOSIS — E1142 Type 2 diabetes mellitus with diabetic polyneuropathy: Secondary | ICD-10-CM | POA: Diagnosis not present

## 2023-10-13 DIAGNOSIS — S61205D Unspecified open wound of left ring finger without damage to nail, subsequent encounter: Secondary | ICD-10-CM | POA: Diagnosis not present

## 2023-10-13 DIAGNOSIS — E1142 Type 2 diabetes mellitus with diabetic polyneuropathy: Secondary | ICD-10-CM | POA: Diagnosis not present

## 2023-10-13 DIAGNOSIS — I2510 Atherosclerotic heart disease of native coronary artery without angina pectoris: Secondary | ICD-10-CM | POA: Diagnosis not present

## 2023-10-13 DIAGNOSIS — S61203D Unspecified open wound of left middle finger without damage to nail, subsequent encounter: Secondary | ICD-10-CM | POA: Diagnosis not present

## 2023-10-13 DIAGNOSIS — I129 Hypertensive chronic kidney disease with stage 1 through stage 4 chronic kidney disease, or unspecified chronic kidney disease: Secondary | ICD-10-CM | POA: Diagnosis not present

## 2023-10-13 DIAGNOSIS — S51801D Unspecified open wound of right forearm, subsequent encounter: Secondary | ICD-10-CM | POA: Diagnosis not present

## 2023-10-13 DIAGNOSIS — I480 Paroxysmal atrial fibrillation: Secondary | ICD-10-CM | POA: Diagnosis not present

## 2023-10-13 DIAGNOSIS — N1831 Chronic kidney disease, stage 3a: Secondary | ICD-10-CM | POA: Diagnosis not present

## 2023-10-13 DIAGNOSIS — E1122 Type 2 diabetes mellitus with diabetic chronic kidney disease: Secondary | ICD-10-CM | POA: Diagnosis not present

## 2023-10-18 DIAGNOSIS — S51801D Unspecified open wound of right forearm, subsequent encounter: Secondary | ICD-10-CM | POA: Diagnosis not present

## 2023-10-18 DIAGNOSIS — E11319 Type 2 diabetes mellitus with unspecified diabetic retinopathy without macular edema: Secondary | ICD-10-CM | POA: Diagnosis not present

## 2023-10-18 DIAGNOSIS — I129 Hypertensive chronic kidney disease with stage 1 through stage 4 chronic kidney disease, or unspecified chronic kidney disease: Secondary | ICD-10-CM | POA: Diagnosis not present

## 2023-10-18 DIAGNOSIS — E1122 Type 2 diabetes mellitus with diabetic chronic kidney disease: Secondary | ICD-10-CM | POA: Diagnosis not present

## 2023-10-18 DIAGNOSIS — N1831 Chronic kidney disease, stage 3a: Secondary | ICD-10-CM | POA: Diagnosis not present

## 2023-10-18 DIAGNOSIS — E1151 Type 2 diabetes mellitus with diabetic peripheral angiopathy without gangrene: Secondary | ICD-10-CM | POA: Diagnosis not present

## 2023-10-18 DIAGNOSIS — E1142 Type 2 diabetes mellitus with diabetic polyneuropathy: Secondary | ICD-10-CM | POA: Diagnosis not present

## 2023-10-18 DIAGNOSIS — I480 Paroxysmal atrial fibrillation: Secondary | ICD-10-CM | POA: Diagnosis not present

## 2023-10-18 DIAGNOSIS — I2510 Atherosclerotic heart disease of native coronary artery without angina pectoris: Secondary | ICD-10-CM | POA: Diagnosis not present

## 2023-10-20 DIAGNOSIS — I2510 Atherosclerotic heart disease of native coronary artery without angina pectoris: Secondary | ICD-10-CM | POA: Diagnosis not present

## 2023-10-20 DIAGNOSIS — S51801D Unspecified open wound of right forearm, subsequent encounter: Secondary | ICD-10-CM | POA: Diagnosis not present

## 2023-10-20 DIAGNOSIS — E1142 Type 2 diabetes mellitus with diabetic polyneuropathy: Secondary | ICD-10-CM | POA: Diagnosis not present

## 2023-10-20 DIAGNOSIS — I480 Paroxysmal atrial fibrillation: Secondary | ICD-10-CM | POA: Diagnosis not present

## 2023-10-20 DIAGNOSIS — E11319 Type 2 diabetes mellitus with unspecified diabetic retinopathy without macular edema: Secondary | ICD-10-CM | POA: Diagnosis not present

## 2023-10-20 DIAGNOSIS — E1122 Type 2 diabetes mellitus with diabetic chronic kidney disease: Secondary | ICD-10-CM | POA: Diagnosis not present

## 2023-10-20 DIAGNOSIS — N1831 Chronic kidney disease, stage 3a: Secondary | ICD-10-CM | POA: Diagnosis not present

## 2023-10-20 DIAGNOSIS — E1151 Type 2 diabetes mellitus with diabetic peripheral angiopathy without gangrene: Secondary | ICD-10-CM | POA: Diagnosis not present

## 2023-10-20 DIAGNOSIS — I129 Hypertensive chronic kidney disease with stage 1 through stage 4 chronic kidney disease, or unspecified chronic kidney disease: Secondary | ICD-10-CM | POA: Diagnosis not present

## 2023-10-23 ENCOUNTER — Ambulatory Visit: Payer: Medicare PPO | Admitting: Podiatry

## 2023-10-23 ENCOUNTER — Encounter: Payer: Self-pay | Admitting: Podiatry

## 2023-10-23 VITALS — Ht 72.0 in | Wt 175.5 lb

## 2023-10-23 DIAGNOSIS — B351 Tinea unguium: Secondary | ICD-10-CM

## 2023-10-23 DIAGNOSIS — S51801D Unspecified open wound of right forearm, subsequent encounter: Secondary | ICD-10-CM | POA: Diagnosis not present

## 2023-10-23 DIAGNOSIS — E1151 Type 2 diabetes mellitus with diabetic peripheral angiopathy without gangrene: Secondary | ICD-10-CM | POA: Diagnosis not present

## 2023-10-23 DIAGNOSIS — M79674 Pain in right toe(s): Secondary | ICD-10-CM | POA: Diagnosis not present

## 2023-10-23 DIAGNOSIS — I129 Hypertensive chronic kidney disease with stage 1 through stage 4 chronic kidney disease, or unspecified chronic kidney disease: Secondary | ICD-10-CM | POA: Diagnosis not present

## 2023-10-23 DIAGNOSIS — I872 Venous insufficiency (chronic) (peripheral): Secondary | ICD-10-CM

## 2023-10-23 DIAGNOSIS — I480 Paroxysmal atrial fibrillation: Secondary | ICD-10-CM | POA: Diagnosis not present

## 2023-10-23 DIAGNOSIS — E1142 Type 2 diabetes mellitus with diabetic polyneuropathy: Secondary | ICD-10-CM | POA: Diagnosis not present

## 2023-10-23 DIAGNOSIS — N1831 Chronic kidney disease, stage 3a: Secondary | ICD-10-CM | POA: Diagnosis not present

## 2023-10-23 DIAGNOSIS — E1122 Type 2 diabetes mellitus with diabetic chronic kidney disease: Secondary | ICD-10-CM | POA: Diagnosis not present

## 2023-10-23 DIAGNOSIS — M79675 Pain in left toe(s): Secondary | ICD-10-CM

## 2023-10-23 DIAGNOSIS — I2510 Atherosclerotic heart disease of native coronary artery without angina pectoris: Secondary | ICD-10-CM | POA: Diagnosis not present

## 2023-10-23 DIAGNOSIS — E11319 Type 2 diabetes mellitus with unspecified diabetic retinopathy without macular edema: Secondary | ICD-10-CM | POA: Diagnosis not present

## 2023-10-23 NOTE — Progress Notes (Signed)
 This patient presents to the office with chief complaint of long thick nails and diabetic feet.  This patient  says there  is  no pain and discomfort in their feet.  This patient says there are long thick painful nails.  These nails are painful walking and wearing shoes.  This patient presents  to the office today for treatment of the  long nails and a foot evaluation due to history of  diabetes.  General Appearance  Alert, conversant and in no acute stress.  Vascular  Dorsalis pedis and posterior tibial  pulses are absent  bilaterally.  Capillary return is within normal limits  bilaterally. Temperature is within normal limits  bilaterally.Venous stasis legs  B/L.  Neurologic  Senn-Weinstein monofilament wire test diminished  bilaterally. Muscle power within normal limits bilaterally.  Nails Thick disfigured discolored nails with subungual debris  hallux nails  bilaterally. No evidence of bacterial infection or drainage bilaterally.  Orthopedic  No limitations of motion of motion feet .  No crepitus or effusions noted.  No bony pathology or digital deformities noted. Midfoot  DJD.  Mild HAV  B/L.  Skin  normotropic skin with no porokeratosis noted bilaterally.  No signs of infections or ulcers noted.      Diabetes with vascular and neurologic pathology.     A diabetic foot exam was performed and there is evidence of any vascular or neurologic pathology.  Nails were done as a courtesy. RTC 3 months.   Cordella Bold DPM

## 2023-10-27 DIAGNOSIS — N1831 Chronic kidney disease, stage 3a: Secondary | ICD-10-CM | POA: Diagnosis not present

## 2023-10-27 DIAGNOSIS — E1151 Type 2 diabetes mellitus with diabetic peripheral angiopathy without gangrene: Secondary | ICD-10-CM | POA: Diagnosis not present

## 2023-10-27 DIAGNOSIS — E1142 Type 2 diabetes mellitus with diabetic polyneuropathy: Secondary | ICD-10-CM | POA: Diagnosis not present

## 2023-10-27 DIAGNOSIS — E1122 Type 2 diabetes mellitus with diabetic chronic kidney disease: Secondary | ICD-10-CM | POA: Diagnosis not present

## 2023-10-27 DIAGNOSIS — I129 Hypertensive chronic kidney disease with stage 1 through stage 4 chronic kidney disease, or unspecified chronic kidney disease: Secondary | ICD-10-CM | POA: Diagnosis not present

## 2023-10-27 DIAGNOSIS — I480 Paroxysmal atrial fibrillation: Secondary | ICD-10-CM | POA: Diagnosis not present

## 2023-10-27 DIAGNOSIS — S51801D Unspecified open wound of right forearm, subsequent encounter: Secondary | ICD-10-CM | POA: Diagnosis not present

## 2023-10-27 DIAGNOSIS — E11319 Type 2 diabetes mellitus with unspecified diabetic retinopathy without macular edema: Secondary | ICD-10-CM | POA: Diagnosis not present

## 2023-10-27 DIAGNOSIS — I2510 Atherosclerotic heart disease of native coronary artery without angina pectoris: Secondary | ICD-10-CM | POA: Diagnosis not present

## 2023-10-29 DIAGNOSIS — H43813 Vitreous degeneration, bilateral: Secondary | ICD-10-CM | POA: Diagnosis not present

## 2023-10-29 DIAGNOSIS — H02105 Unspecified ectropion of left lower eyelid: Secondary | ICD-10-CM | POA: Diagnosis not present

## 2023-10-29 DIAGNOSIS — H02102 Unspecified ectropion of right lower eyelid: Secondary | ICD-10-CM | POA: Diagnosis not present

## 2023-10-29 DIAGNOSIS — H04123 Dry eye syndrome of bilateral lacrimal glands: Secondary | ICD-10-CM | POA: Diagnosis not present

## 2023-10-29 DIAGNOSIS — E119 Type 2 diabetes mellitus without complications: Secondary | ICD-10-CM | POA: Diagnosis not present

## 2023-10-29 DIAGNOSIS — Z961 Presence of intraocular lens: Secondary | ICD-10-CM | POA: Diagnosis not present

## 2023-10-30 DIAGNOSIS — S41111A Laceration without foreign body of right upper arm, initial encounter: Secondary | ICD-10-CM | POA: Diagnosis not present

## 2023-10-30 DIAGNOSIS — N1831 Chronic kidney disease, stage 3a: Secondary | ICD-10-CM | POA: Diagnosis not present

## 2023-10-31 DIAGNOSIS — S51801D Unspecified open wound of right forearm, subsequent encounter: Secondary | ICD-10-CM | POA: Diagnosis not present

## 2023-10-31 DIAGNOSIS — N1831 Chronic kidney disease, stage 3a: Secondary | ICD-10-CM | POA: Diagnosis not present

## 2023-10-31 DIAGNOSIS — I2510 Atherosclerotic heart disease of native coronary artery without angina pectoris: Secondary | ICD-10-CM | POA: Diagnosis not present

## 2023-10-31 DIAGNOSIS — E1151 Type 2 diabetes mellitus with diabetic peripheral angiopathy without gangrene: Secondary | ICD-10-CM | POA: Diagnosis not present

## 2023-10-31 DIAGNOSIS — E1142 Type 2 diabetes mellitus with diabetic polyneuropathy: Secondary | ICD-10-CM | POA: Diagnosis not present

## 2023-10-31 DIAGNOSIS — I129 Hypertensive chronic kidney disease with stage 1 through stage 4 chronic kidney disease, or unspecified chronic kidney disease: Secondary | ICD-10-CM | POA: Diagnosis not present

## 2023-10-31 DIAGNOSIS — E1122 Type 2 diabetes mellitus with diabetic chronic kidney disease: Secondary | ICD-10-CM | POA: Diagnosis not present

## 2023-10-31 DIAGNOSIS — I480 Paroxysmal atrial fibrillation: Secondary | ICD-10-CM | POA: Diagnosis not present

## 2023-10-31 DIAGNOSIS — E11319 Type 2 diabetes mellitus with unspecified diabetic retinopathy without macular edema: Secondary | ICD-10-CM | POA: Diagnosis not present

## 2023-11-03 DIAGNOSIS — E1151 Type 2 diabetes mellitus with diabetic peripheral angiopathy without gangrene: Secondary | ICD-10-CM | POA: Diagnosis not present

## 2023-11-03 DIAGNOSIS — S51801D Unspecified open wound of right forearm, subsequent encounter: Secondary | ICD-10-CM | POA: Diagnosis not present

## 2023-11-03 DIAGNOSIS — E11319 Type 2 diabetes mellitus with unspecified diabetic retinopathy without macular edema: Secondary | ICD-10-CM | POA: Diagnosis not present

## 2023-11-03 DIAGNOSIS — I480 Paroxysmal atrial fibrillation: Secondary | ICD-10-CM | POA: Diagnosis not present

## 2023-11-03 DIAGNOSIS — E1122 Type 2 diabetes mellitus with diabetic chronic kidney disease: Secondary | ICD-10-CM | POA: Diagnosis not present

## 2023-11-03 DIAGNOSIS — I2510 Atherosclerotic heart disease of native coronary artery without angina pectoris: Secondary | ICD-10-CM | POA: Diagnosis not present

## 2023-11-03 DIAGNOSIS — I129 Hypertensive chronic kidney disease with stage 1 through stage 4 chronic kidney disease, or unspecified chronic kidney disease: Secondary | ICD-10-CM | POA: Diagnosis not present

## 2023-11-03 DIAGNOSIS — N1831 Chronic kidney disease, stage 3a: Secondary | ICD-10-CM | POA: Diagnosis not present

## 2023-11-03 DIAGNOSIS — E1142 Type 2 diabetes mellitus with diabetic polyneuropathy: Secondary | ICD-10-CM | POA: Diagnosis not present

## 2023-11-04 DIAGNOSIS — E1122 Type 2 diabetes mellitus with diabetic chronic kidney disease: Secondary | ICD-10-CM | POA: Diagnosis not present

## 2023-11-04 DIAGNOSIS — S51801D Unspecified open wound of right forearm, subsequent encounter: Secondary | ICD-10-CM | POA: Diagnosis not present

## 2023-11-04 DIAGNOSIS — I2510 Atherosclerotic heart disease of native coronary artery without angina pectoris: Secondary | ICD-10-CM | POA: Diagnosis not present

## 2023-11-04 DIAGNOSIS — N1831 Chronic kidney disease, stage 3a: Secondary | ICD-10-CM | POA: Diagnosis not present

## 2023-11-04 DIAGNOSIS — E1151 Type 2 diabetes mellitus with diabetic peripheral angiopathy without gangrene: Secondary | ICD-10-CM | POA: Diagnosis not present

## 2023-11-04 DIAGNOSIS — I480 Paroxysmal atrial fibrillation: Secondary | ICD-10-CM | POA: Diagnosis not present

## 2023-11-04 DIAGNOSIS — E1142 Type 2 diabetes mellitus with diabetic polyneuropathy: Secondary | ICD-10-CM | POA: Diagnosis not present

## 2023-11-04 DIAGNOSIS — I129 Hypertensive chronic kidney disease with stage 1 through stage 4 chronic kidney disease, or unspecified chronic kidney disease: Secondary | ICD-10-CM | POA: Diagnosis not present

## 2023-11-04 DIAGNOSIS — E11319 Type 2 diabetes mellitus with unspecified diabetic retinopathy without macular edema: Secondary | ICD-10-CM | POA: Diagnosis not present

## 2023-11-05 DIAGNOSIS — S51801D Unspecified open wound of right forearm, subsequent encounter: Secondary | ICD-10-CM | POA: Diagnosis not present

## 2023-11-05 DIAGNOSIS — I2510 Atherosclerotic heart disease of native coronary artery without angina pectoris: Secondary | ICD-10-CM | POA: Diagnosis not present

## 2023-11-05 DIAGNOSIS — I129 Hypertensive chronic kidney disease with stage 1 through stage 4 chronic kidney disease, or unspecified chronic kidney disease: Secondary | ICD-10-CM | POA: Diagnosis not present

## 2023-11-05 DIAGNOSIS — E11319 Type 2 diabetes mellitus with unspecified diabetic retinopathy without macular edema: Secondary | ICD-10-CM | POA: Diagnosis not present

## 2023-11-05 DIAGNOSIS — E1122 Type 2 diabetes mellitus with diabetic chronic kidney disease: Secondary | ICD-10-CM | POA: Diagnosis not present

## 2023-11-05 DIAGNOSIS — E1151 Type 2 diabetes mellitus with diabetic peripheral angiopathy without gangrene: Secondary | ICD-10-CM | POA: Diagnosis not present

## 2023-11-05 DIAGNOSIS — E1142 Type 2 diabetes mellitus with diabetic polyneuropathy: Secondary | ICD-10-CM | POA: Diagnosis not present

## 2023-11-05 DIAGNOSIS — N1831 Chronic kidney disease, stage 3a: Secondary | ICD-10-CM | POA: Diagnosis not present

## 2023-11-05 DIAGNOSIS — I480 Paroxysmal atrial fibrillation: Secondary | ICD-10-CM | POA: Diagnosis not present

## 2023-11-06 DIAGNOSIS — E1151 Type 2 diabetes mellitus with diabetic peripheral angiopathy without gangrene: Secondary | ICD-10-CM | POA: Diagnosis not present

## 2023-11-06 DIAGNOSIS — I2510 Atherosclerotic heart disease of native coronary artery without angina pectoris: Secondary | ICD-10-CM | POA: Diagnosis not present

## 2023-11-06 DIAGNOSIS — I480 Paroxysmal atrial fibrillation: Secondary | ICD-10-CM | POA: Diagnosis not present

## 2023-11-06 DIAGNOSIS — E1142 Type 2 diabetes mellitus with diabetic polyneuropathy: Secondary | ICD-10-CM | POA: Diagnosis not present

## 2023-11-06 DIAGNOSIS — N1831 Chronic kidney disease, stage 3a: Secondary | ICD-10-CM | POA: Diagnosis not present

## 2023-11-06 DIAGNOSIS — S51801D Unspecified open wound of right forearm, subsequent encounter: Secondary | ICD-10-CM | POA: Diagnosis not present

## 2023-11-06 DIAGNOSIS — E1122 Type 2 diabetes mellitus with diabetic chronic kidney disease: Secondary | ICD-10-CM | POA: Diagnosis not present

## 2023-11-06 DIAGNOSIS — E11319 Type 2 diabetes mellitus with unspecified diabetic retinopathy without macular edema: Secondary | ICD-10-CM | POA: Diagnosis not present

## 2023-11-06 DIAGNOSIS — I129 Hypertensive chronic kidney disease with stage 1 through stage 4 chronic kidney disease, or unspecified chronic kidney disease: Secondary | ICD-10-CM | POA: Diagnosis not present

## 2023-11-07 DIAGNOSIS — E1142 Type 2 diabetes mellitus with diabetic polyneuropathy: Secondary | ICD-10-CM | POA: Diagnosis not present

## 2023-11-07 DIAGNOSIS — E11319 Type 2 diabetes mellitus with unspecified diabetic retinopathy without macular edema: Secondary | ICD-10-CM | POA: Diagnosis not present

## 2023-11-07 DIAGNOSIS — I480 Paroxysmal atrial fibrillation: Secondary | ICD-10-CM | POA: Diagnosis not present

## 2023-11-07 DIAGNOSIS — S51801D Unspecified open wound of right forearm, subsequent encounter: Secondary | ICD-10-CM | POA: Diagnosis not present

## 2023-11-07 DIAGNOSIS — I129 Hypertensive chronic kidney disease with stage 1 through stage 4 chronic kidney disease, or unspecified chronic kidney disease: Secondary | ICD-10-CM | POA: Diagnosis not present

## 2023-11-07 DIAGNOSIS — I2510 Atherosclerotic heart disease of native coronary artery without angina pectoris: Secondary | ICD-10-CM | POA: Diagnosis not present

## 2023-11-07 DIAGNOSIS — N1831 Chronic kidney disease, stage 3a: Secondary | ICD-10-CM | POA: Diagnosis not present

## 2023-11-07 DIAGNOSIS — E1151 Type 2 diabetes mellitus with diabetic peripheral angiopathy without gangrene: Secondary | ICD-10-CM | POA: Diagnosis not present

## 2023-11-07 DIAGNOSIS — E1122 Type 2 diabetes mellitus with diabetic chronic kidney disease: Secondary | ICD-10-CM | POA: Diagnosis not present

## 2023-11-10 DIAGNOSIS — N1831 Chronic kidney disease, stage 3a: Secondary | ICD-10-CM | POA: Diagnosis not present

## 2023-11-10 DIAGNOSIS — I2510 Atherosclerotic heart disease of native coronary artery without angina pectoris: Secondary | ICD-10-CM | POA: Diagnosis not present

## 2023-11-10 DIAGNOSIS — E11319 Type 2 diabetes mellitus with unspecified diabetic retinopathy without macular edema: Secondary | ICD-10-CM | POA: Diagnosis not present

## 2023-11-10 DIAGNOSIS — E1122 Type 2 diabetes mellitus with diabetic chronic kidney disease: Secondary | ICD-10-CM | POA: Diagnosis not present

## 2023-11-10 DIAGNOSIS — E1142 Type 2 diabetes mellitus with diabetic polyneuropathy: Secondary | ICD-10-CM | POA: Diagnosis not present

## 2023-11-10 DIAGNOSIS — S51801D Unspecified open wound of right forearm, subsequent encounter: Secondary | ICD-10-CM | POA: Diagnosis not present

## 2023-11-10 DIAGNOSIS — I129 Hypertensive chronic kidney disease with stage 1 through stage 4 chronic kidney disease, or unspecified chronic kidney disease: Secondary | ICD-10-CM | POA: Diagnosis not present

## 2023-11-10 DIAGNOSIS — E1151 Type 2 diabetes mellitus with diabetic peripheral angiopathy without gangrene: Secondary | ICD-10-CM | POA: Diagnosis not present

## 2023-11-10 DIAGNOSIS — I480 Paroxysmal atrial fibrillation: Secondary | ICD-10-CM | POA: Diagnosis not present

## 2023-11-11 DIAGNOSIS — N1831 Chronic kidney disease, stage 3a: Secondary | ICD-10-CM | POA: Diagnosis not present

## 2023-11-11 DIAGNOSIS — I480 Paroxysmal atrial fibrillation: Secondary | ICD-10-CM | POA: Diagnosis not present

## 2023-11-11 DIAGNOSIS — I129 Hypertensive chronic kidney disease with stage 1 through stage 4 chronic kidney disease, or unspecified chronic kidney disease: Secondary | ICD-10-CM | POA: Diagnosis not present

## 2023-11-11 DIAGNOSIS — E1151 Type 2 diabetes mellitus with diabetic peripheral angiopathy without gangrene: Secondary | ICD-10-CM | POA: Diagnosis not present

## 2023-11-11 DIAGNOSIS — E11319 Type 2 diabetes mellitus with unspecified diabetic retinopathy without macular edema: Secondary | ICD-10-CM | POA: Diagnosis not present

## 2023-11-11 DIAGNOSIS — E1122 Type 2 diabetes mellitus with diabetic chronic kidney disease: Secondary | ICD-10-CM | POA: Diagnosis not present

## 2023-11-11 DIAGNOSIS — E1142 Type 2 diabetes mellitus with diabetic polyneuropathy: Secondary | ICD-10-CM | POA: Diagnosis not present

## 2023-11-11 DIAGNOSIS — S51801D Unspecified open wound of right forearm, subsequent encounter: Secondary | ICD-10-CM | POA: Diagnosis not present

## 2023-11-11 DIAGNOSIS — I2510 Atherosclerotic heart disease of native coronary artery without angina pectoris: Secondary | ICD-10-CM | POA: Diagnosis not present

## 2023-11-12 DIAGNOSIS — L02414 Cutaneous abscess of left upper limb: Secondary | ICD-10-CM | POA: Diagnosis not present

## 2023-11-12 DIAGNOSIS — T148XXA Other injury of unspecified body region, initial encounter: Secondary | ICD-10-CM | POA: Diagnosis not present

## 2023-11-13 DIAGNOSIS — E1151 Type 2 diabetes mellitus with diabetic peripheral angiopathy without gangrene: Secondary | ICD-10-CM | POA: Diagnosis not present

## 2023-11-13 DIAGNOSIS — E1142 Type 2 diabetes mellitus with diabetic polyneuropathy: Secondary | ICD-10-CM | POA: Diagnosis not present

## 2023-11-13 DIAGNOSIS — E11319 Type 2 diabetes mellitus with unspecified diabetic retinopathy without macular edema: Secondary | ICD-10-CM | POA: Diagnosis not present

## 2023-11-13 DIAGNOSIS — I480 Paroxysmal atrial fibrillation: Secondary | ICD-10-CM | POA: Diagnosis not present

## 2023-11-13 DIAGNOSIS — E1122 Type 2 diabetes mellitus with diabetic chronic kidney disease: Secondary | ICD-10-CM | POA: Diagnosis not present

## 2023-11-13 DIAGNOSIS — S51801D Unspecified open wound of right forearm, subsequent encounter: Secondary | ICD-10-CM | POA: Diagnosis not present

## 2023-11-13 DIAGNOSIS — I2510 Atherosclerotic heart disease of native coronary artery without angina pectoris: Secondary | ICD-10-CM | POA: Diagnosis not present

## 2023-11-13 DIAGNOSIS — I129 Hypertensive chronic kidney disease with stage 1 through stage 4 chronic kidney disease, or unspecified chronic kidney disease: Secondary | ICD-10-CM | POA: Diagnosis not present

## 2023-11-13 DIAGNOSIS — N1831 Chronic kidney disease, stage 3a: Secondary | ICD-10-CM | POA: Diagnosis not present

## 2023-11-14 DIAGNOSIS — I480 Paroxysmal atrial fibrillation: Secondary | ICD-10-CM | POA: Diagnosis not present

## 2023-11-14 DIAGNOSIS — E1122 Type 2 diabetes mellitus with diabetic chronic kidney disease: Secondary | ICD-10-CM | POA: Diagnosis not present

## 2023-11-14 DIAGNOSIS — E1142 Type 2 diabetes mellitus with diabetic polyneuropathy: Secondary | ICD-10-CM | POA: Diagnosis not present

## 2023-11-14 DIAGNOSIS — N1831 Chronic kidney disease, stage 3a: Secondary | ICD-10-CM | POA: Diagnosis not present

## 2023-11-14 DIAGNOSIS — I2510 Atherosclerotic heart disease of native coronary artery without angina pectoris: Secondary | ICD-10-CM | POA: Diagnosis not present

## 2023-11-14 DIAGNOSIS — E1151 Type 2 diabetes mellitus with diabetic peripheral angiopathy without gangrene: Secondary | ICD-10-CM | POA: Diagnosis not present

## 2023-11-14 DIAGNOSIS — S51801D Unspecified open wound of right forearm, subsequent encounter: Secondary | ICD-10-CM | POA: Diagnosis not present

## 2023-11-14 DIAGNOSIS — I129 Hypertensive chronic kidney disease with stage 1 through stage 4 chronic kidney disease, or unspecified chronic kidney disease: Secondary | ICD-10-CM | POA: Diagnosis not present

## 2023-11-14 DIAGNOSIS — E11319 Type 2 diabetes mellitus with unspecified diabetic retinopathy without macular edema: Secondary | ICD-10-CM | POA: Diagnosis not present

## 2023-11-17 DIAGNOSIS — E1122 Type 2 diabetes mellitus with diabetic chronic kidney disease: Secondary | ICD-10-CM | POA: Diagnosis not present

## 2023-11-17 DIAGNOSIS — N1831 Chronic kidney disease, stage 3a: Secondary | ICD-10-CM | POA: Diagnosis not present

## 2023-11-17 DIAGNOSIS — E11319 Type 2 diabetes mellitus with unspecified diabetic retinopathy without macular edema: Secondary | ICD-10-CM | POA: Diagnosis not present

## 2023-11-17 DIAGNOSIS — E1142 Type 2 diabetes mellitus with diabetic polyneuropathy: Secondary | ICD-10-CM | POA: Diagnosis not present

## 2023-11-17 DIAGNOSIS — I480 Paroxysmal atrial fibrillation: Secondary | ICD-10-CM | POA: Diagnosis not present

## 2023-11-17 DIAGNOSIS — I2510 Atherosclerotic heart disease of native coronary artery without angina pectoris: Secondary | ICD-10-CM | POA: Diagnosis not present

## 2023-11-17 DIAGNOSIS — E1151 Type 2 diabetes mellitus with diabetic peripheral angiopathy without gangrene: Secondary | ICD-10-CM | POA: Diagnosis not present

## 2023-11-17 DIAGNOSIS — S51801D Unspecified open wound of right forearm, subsequent encounter: Secondary | ICD-10-CM | POA: Diagnosis not present

## 2023-11-17 DIAGNOSIS — I129 Hypertensive chronic kidney disease with stage 1 through stage 4 chronic kidney disease, or unspecified chronic kidney disease: Secondary | ICD-10-CM | POA: Diagnosis not present

## 2023-11-19 DIAGNOSIS — E11319 Type 2 diabetes mellitus with unspecified diabetic retinopathy without macular edema: Secondary | ICD-10-CM | POA: Diagnosis not present

## 2023-11-19 DIAGNOSIS — E1142 Type 2 diabetes mellitus with diabetic polyneuropathy: Secondary | ICD-10-CM | POA: Diagnosis not present

## 2023-11-19 DIAGNOSIS — S51801D Unspecified open wound of right forearm, subsequent encounter: Secondary | ICD-10-CM | POA: Diagnosis not present

## 2023-11-19 DIAGNOSIS — E1122 Type 2 diabetes mellitus with diabetic chronic kidney disease: Secondary | ICD-10-CM | POA: Diagnosis not present

## 2023-11-19 DIAGNOSIS — I2510 Atherosclerotic heart disease of native coronary artery without angina pectoris: Secondary | ICD-10-CM | POA: Diagnosis not present

## 2023-11-19 DIAGNOSIS — N1831 Chronic kidney disease, stage 3a: Secondary | ICD-10-CM | POA: Diagnosis not present

## 2023-11-19 DIAGNOSIS — I129 Hypertensive chronic kidney disease with stage 1 through stage 4 chronic kidney disease, or unspecified chronic kidney disease: Secondary | ICD-10-CM | POA: Diagnosis not present

## 2023-11-19 DIAGNOSIS — E1151 Type 2 diabetes mellitus with diabetic peripheral angiopathy without gangrene: Secondary | ICD-10-CM | POA: Diagnosis not present

## 2023-11-19 DIAGNOSIS — I480 Paroxysmal atrial fibrillation: Secondary | ICD-10-CM | POA: Diagnosis not present

## 2023-11-20 DIAGNOSIS — E1122 Type 2 diabetes mellitus with diabetic chronic kidney disease: Secondary | ICD-10-CM | POA: Diagnosis not present

## 2023-11-20 DIAGNOSIS — E1142 Type 2 diabetes mellitus with diabetic polyneuropathy: Secondary | ICD-10-CM | POA: Diagnosis not present

## 2023-11-20 DIAGNOSIS — I480 Paroxysmal atrial fibrillation: Secondary | ICD-10-CM | POA: Diagnosis not present

## 2023-11-20 DIAGNOSIS — N1831 Chronic kidney disease, stage 3a: Secondary | ICD-10-CM | POA: Diagnosis not present

## 2023-11-20 DIAGNOSIS — I129 Hypertensive chronic kidney disease with stage 1 through stage 4 chronic kidney disease, or unspecified chronic kidney disease: Secondary | ICD-10-CM | POA: Diagnosis not present

## 2023-11-20 DIAGNOSIS — E1151 Type 2 diabetes mellitus with diabetic peripheral angiopathy without gangrene: Secondary | ICD-10-CM | POA: Diagnosis not present

## 2023-11-20 DIAGNOSIS — I2510 Atherosclerotic heart disease of native coronary artery without angina pectoris: Secondary | ICD-10-CM | POA: Diagnosis not present

## 2023-11-20 DIAGNOSIS — E11319 Type 2 diabetes mellitus with unspecified diabetic retinopathy without macular edema: Secondary | ICD-10-CM | POA: Diagnosis not present

## 2023-11-20 DIAGNOSIS — S51801D Unspecified open wound of right forearm, subsequent encounter: Secondary | ICD-10-CM | POA: Diagnosis not present

## 2023-11-21 DIAGNOSIS — I129 Hypertensive chronic kidney disease with stage 1 through stage 4 chronic kidney disease, or unspecified chronic kidney disease: Secondary | ICD-10-CM | POA: Diagnosis not present

## 2023-11-21 DIAGNOSIS — E1122 Type 2 diabetes mellitus with diabetic chronic kidney disease: Secondary | ICD-10-CM | POA: Diagnosis not present

## 2023-11-21 DIAGNOSIS — E1151 Type 2 diabetes mellitus with diabetic peripheral angiopathy without gangrene: Secondary | ICD-10-CM | POA: Diagnosis not present

## 2023-11-21 DIAGNOSIS — E11319 Type 2 diabetes mellitus with unspecified diabetic retinopathy without macular edema: Secondary | ICD-10-CM | POA: Diagnosis not present

## 2023-11-21 DIAGNOSIS — E1142 Type 2 diabetes mellitus with diabetic polyneuropathy: Secondary | ICD-10-CM | POA: Diagnosis not present

## 2023-11-21 DIAGNOSIS — I480 Paroxysmal atrial fibrillation: Secondary | ICD-10-CM | POA: Diagnosis not present

## 2023-11-21 DIAGNOSIS — N1831 Chronic kidney disease, stage 3a: Secondary | ICD-10-CM | POA: Diagnosis not present

## 2023-11-21 DIAGNOSIS — I2510 Atherosclerotic heart disease of native coronary artery without angina pectoris: Secondary | ICD-10-CM | POA: Diagnosis not present

## 2023-11-21 DIAGNOSIS — S51801D Unspecified open wound of right forearm, subsequent encounter: Secondary | ICD-10-CM | POA: Diagnosis not present

## 2023-11-24 DIAGNOSIS — E1142 Type 2 diabetes mellitus with diabetic polyneuropathy: Secondary | ICD-10-CM | POA: Diagnosis not present

## 2023-11-24 DIAGNOSIS — E1122 Type 2 diabetes mellitus with diabetic chronic kidney disease: Secondary | ICD-10-CM | POA: Diagnosis not present

## 2023-11-24 DIAGNOSIS — I129 Hypertensive chronic kidney disease with stage 1 through stage 4 chronic kidney disease, or unspecified chronic kidney disease: Secondary | ICD-10-CM | POA: Diagnosis not present

## 2023-11-24 DIAGNOSIS — E11319 Type 2 diabetes mellitus with unspecified diabetic retinopathy without macular edema: Secondary | ICD-10-CM | POA: Diagnosis not present

## 2023-11-24 DIAGNOSIS — S51801D Unspecified open wound of right forearm, subsequent encounter: Secondary | ICD-10-CM | POA: Diagnosis not present

## 2023-11-24 DIAGNOSIS — I480 Paroxysmal atrial fibrillation: Secondary | ICD-10-CM | POA: Diagnosis not present

## 2023-11-24 DIAGNOSIS — E1151 Type 2 diabetes mellitus with diabetic peripheral angiopathy without gangrene: Secondary | ICD-10-CM | POA: Diagnosis not present

## 2023-11-24 DIAGNOSIS — N1831 Chronic kidney disease, stage 3a: Secondary | ICD-10-CM | POA: Diagnosis not present

## 2023-11-24 DIAGNOSIS — I2510 Atherosclerotic heart disease of native coronary artery without angina pectoris: Secondary | ICD-10-CM | POA: Diagnosis not present

## 2023-11-25 DIAGNOSIS — N1831 Chronic kidney disease, stage 3a: Secondary | ICD-10-CM | POA: Diagnosis not present

## 2023-11-25 DIAGNOSIS — S51801D Unspecified open wound of right forearm, subsequent encounter: Secondary | ICD-10-CM | POA: Diagnosis not present

## 2023-11-25 DIAGNOSIS — E11319 Type 2 diabetes mellitus with unspecified diabetic retinopathy without macular edema: Secondary | ICD-10-CM | POA: Diagnosis not present

## 2023-11-25 DIAGNOSIS — E1122 Type 2 diabetes mellitus with diabetic chronic kidney disease: Secondary | ICD-10-CM | POA: Diagnosis not present

## 2023-11-25 DIAGNOSIS — I129 Hypertensive chronic kidney disease with stage 1 through stage 4 chronic kidney disease, or unspecified chronic kidney disease: Secondary | ICD-10-CM | POA: Diagnosis not present

## 2023-11-25 DIAGNOSIS — E1151 Type 2 diabetes mellitus with diabetic peripheral angiopathy without gangrene: Secondary | ICD-10-CM | POA: Diagnosis not present

## 2023-11-25 DIAGNOSIS — I480 Paroxysmal atrial fibrillation: Secondary | ICD-10-CM | POA: Diagnosis not present

## 2023-11-25 DIAGNOSIS — I2510 Atherosclerotic heart disease of native coronary artery without angina pectoris: Secondary | ICD-10-CM | POA: Diagnosis not present

## 2023-11-25 DIAGNOSIS — E1142 Type 2 diabetes mellitus with diabetic polyneuropathy: Secondary | ICD-10-CM | POA: Diagnosis not present

## 2023-11-26 DIAGNOSIS — I2510 Atherosclerotic heart disease of native coronary artery without angina pectoris: Secondary | ICD-10-CM | POA: Diagnosis not present

## 2023-11-26 DIAGNOSIS — I129 Hypertensive chronic kidney disease with stage 1 through stage 4 chronic kidney disease, or unspecified chronic kidney disease: Secondary | ICD-10-CM | POA: Diagnosis not present

## 2023-11-26 DIAGNOSIS — E11319 Type 2 diabetes mellitus with unspecified diabetic retinopathy without macular edema: Secondary | ICD-10-CM | POA: Diagnosis not present

## 2023-11-26 DIAGNOSIS — E1122 Type 2 diabetes mellitus with diabetic chronic kidney disease: Secondary | ICD-10-CM | POA: Diagnosis not present

## 2023-11-26 DIAGNOSIS — I480 Paroxysmal atrial fibrillation: Secondary | ICD-10-CM | POA: Diagnosis not present

## 2023-11-26 DIAGNOSIS — N1831 Chronic kidney disease, stage 3a: Secondary | ICD-10-CM | POA: Diagnosis not present

## 2023-11-26 DIAGNOSIS — E1151 Type 2 diabetes mellitus with diabetic peripheral angiopathy without gangrene: Secondary | ICD-10-CM | POA: Diagnosis not present

## 2023-11-26 DIAGNOSIS — E1142 Type 2 diabetes mellitus with diabetic polyneuropathy: Secondary | ICD-10-CM | POA: Diagnosis not present

## 2023-11-26 DIAGNOSIS — S51801D Unspecified open wound of right forearm, subsequent encounter: Secondary | ICD-10-CM | POA: Diagnosis not present

## 2023-11-28 DIAGNOSIS — I2510 Atherosclerotic heart disease of native coronary artery without angina pectoris: Secondary | ICD-10-CM | POA: Diagnosis not present

## 2023-11-28 DIAGNOSIS — E1122 Type 2 diabetes mellitus with diabetic chronic kidney disease: Secondary | ICD-10-CM | POA: Diagnosis not present

## 2023-11-28 DIAGNOSIS — N1831 Chronic kidney disease, stage 3a: Secondary | ICD-10-CM | POA: Diagnosis not present

## 2023-11-28 DIAGNOSIS — E11319 Type 2 diabetes mellitus with unspecified diabetic retinopathy without macular edema: Secondary | ICD-10-CM | POA: Diagnosis not present

## 2023-11-28 DIAGNOSIS — S51801D Unspecified open wound of right forearm, subsequent encounter: Secondary | ICD-10-CM | POA: Diagnosis not present

## 2023-11-28 DIAGNOSIS — E1142 Type 2 diabetes mellitus with diabetic polyneuropathy: Secondary | ICD-10-CM | POA: Diagnosis not present

## 2023-11-28 DIAGNOSIS — E1151 Type 2 diabetes mellitus with diabetic peripheral angiopathy without gangrene: Secondary | ICD-10-CM | POA: Diagnosis not present

## 2023-11-28 DIAGNOSIS — I480 Paroxysmal atrial fibrillation: Secondary | ICD-10-CM | POA: Diagnosis not present

## 2023-11-28 DIAGNOSIS — I129 Hypertensive chronic kidney disease with stage 1 through stage 4 chronic kidney disease, or unspecified chronic kidney disease: Secondary | ICD-10-CM | POA: Diagnosis not present

## 2023-12-01 DIAGNOSIS — S51801D Unspecified open wound of right forearm, subsequent encounter: Secondary | ICD-10-CM | POA: Diagnosis not present

## 2023-12-01 DIAGNOSIS — I480 Paroxysmal atrial fibrillation: Secondary | ICD-10-CM | POA: Diagnosis not present

## 2023-12-01 DIAGNOSIS — I129 Hypertensive chronic kidney disease with stage 1 through stage 4 chronic kidney disease, or unspecified chronic kidney disease: Secondary | ICD-10-CM | POA: Diagnosis not present

## 2023-12-01 DIAGNOSIS — E1142 Type 2 diabetes mellitus with diabetic polyneuropathy: Secondary | ICD-10-CM | POA: Diagnosis not present

## 2023-12-01 DIAGNOSIS — E1122 Type 2 diabetes mellitus with diabetic chronic kidney disease: Secondary | ICD-10-CM | POA: Diagnosis not present

## 2023-12-01 DIAGNOSIS — E1151 Type 2 diabetes mellitus with diabetic peripheral angiopathy without gangrene: Secondary | ICD-10-CM | POA: Diagnosis not present

## 2023-12-01 DIAGNOSIS — I2510 Atherosclerotic heart disease of native coronary artery without angina pectoris: Secondary | ICD-10-CM | POA: Diagnosis not present

## 2023-12-01 DIAGNOSIS — E11319 Type 2 diabetes mellitus with unspecified diabetic retinopathy without macular edema: Secondary | ICD-10-CM | POA: Diagnosis not present

## 2023-12-01 DIAGNOSIS — N1831 Chronic kidney disease, stage 3a: Secondary | ICD-10-CM | POA: Diagnosis not present

## 2023-12-03 DIAGNOSIS — N1831 Chronic kidney disease, stage 3a: Secondary | ICD-10-CM | POA: Diagnosis not present

## 2023-12-03 DIAGNOSIS — S51801D Unspecified open wound of right forearm, subsequent encounter: Secondary | ICD-10-CM | POA: Diagnosis not present

## 2023-12-03 DIAGNOSIS — E1151 Type 2 diabetes mellitus with diabetic peripheral angiopathy without gangrene: Secondary | ICD-10-CM | POA: Diagnosis not present

## 2023-12-03 DIAGNOSIS — I480 Paroxysmal atrial fibrillation: Secondary | ICD-10-CM | POA: Diagnosis not present

## 2023-12-03 DIAGNOSIS — I129 Hypertensive chronic kidney disease with stage 1 through stage 4 chronic kidney disease, or unspecified chronic kidney disease: Secondary | ICD-10-CM | POA: Diagnosis not present

## 2023-12-03 DIAGNOSIS — E1142 Type 2 diabetes mellitus with diabetic polyneuropathy: Secondary | ICD-10-CM | POA: Diagnosis not present

## 2023-12-03 DIAGNOSIS — I2510 Atherosclerotic heart disease of native coronary artery without angina pectoris: Secondary | ICD-10-CM | POA: Diagnosis not present

## 2023-12-03 DIAGNOSIS — E1122 Type 2 diabetes mellitus with diabetic chronic kidney disease: Secondary | ICD-10-CM | POA: Diagnosis not present

## 2023-12-03 DIAGNOSIS — E11319 Type 2 diabetes mellitus with unspecified diabetic retinopathy without macular edema: Secondary | ICD-10-CM | POA: Diagnosis not present

## 2023-12-04 DIAGNOSIS — E11319 Type 2 diabetes mellitus with unspecified diabetic retinopathy without macular edema: Secondary | ICD-10-CM | POA: Diagnosis not present

## 2023-12-04 DIAGNOSIS — I480 Paroxysmal atrial fibrillation: Secondary | ICD-10-CM | POA: Diagnosis not present

## 2023-12-04 DIAGNOSIS — I129 Hypertensive chronic kidney disease with stage 1 through stage 4 chronic kidney disease, or unspecified chronic kidney disease: Secondary | ICD-10-CM | POA: Diagnosis not present

## 2023-12-04 DIAGNOSIS — E1122 Type 2 diabetes mellitus with diabetic chronic kidney disease: Secondary | ICD-10-CM | POA: Diagnosis not present

## 2023-12-04 DIAGNOSIS — E1142 Type 2 diabetes mellitus with diabetic polyneuropathy: Secondary | ICD-10-CM | POA: Diagnosis not present

## 2023-12-04 DIAGNOSIS — I2510 Atherosclerotic heart disease of native coronary artery without angina pectoris: Secondary | ICD-10-CM | POA: Diagnosis not present

## 2023-12-04 DIAGNOSIS — N1831 Chronic kidney disease, stage 3a: Secondary | ICD-10-CM | POA: Diagnosis not present

## 2023-12-04 DIAGNOSIS — E1151 Type 2 diabetes mellitus with diabetic peripheral angiopathy without gangrene: Secondary | ICD-10-CM | POA: Diagnosis not present

## 2023-12-04 DIAGNOSIS — S51801D Unspecified open wound of right forearm, subsequent encounter: Secondary | ICD-10-CM | POA: Diagnosis not present

## 2023-12-05 DIAGNOSIS — E1142 Type 2 diabetes mellitus with diabetic polyneuropathy: Secondary | ICD-10-CM | POA: Diagnosis not present

## 2023-12-05 DIAGNOSIS — S51801D Unspecified open wound of right forearm, subsequent encounter: Secondary | ICD-10-CM | POA: Diagnosis not present

## 2023-12-05 DIAGNOSIS — I2510 Atherosclerotic heart disease of native coronary artery without angina pectoris: Secondary | ICD-10-CM | POA: Diagnosis not present

## 2023-12-05 DIAGNOSIS — I480 Paroxysmal atrial fibrillation: Secondary | ICD-10-CM | POA: Diagnosis not present

## 2023-12-05 DIAGNOSIS — I129 Hypertensive chronic kidney disease with stage 1 through stage 4 chronic kidney disease, or unspecified chronic kidney disease: Secondary | ICD-10-CM | POA: Diagnosis not present

## 2023-12-05 DIAGNOSIS — E11319 Type 2 diabetes mellitus with unspecified diabetic retinopathy without macular edema: Secondary | ICD-10-CM | POA: Diagnosis not present

## 2023-12-05 DIAGNOSIS — E1122 Type 2 diabetes mellitus with diabetic chronic kidney disease: Secondary | ICD-10-CM | POA: Diagnosis not present

## 2023-12-05 DIAGNOSIS — N1831 Chronic kidney disease, stage 3a: Secondary | ICD-10-CM | POA: Diagnosis not present

## 2023-12-05 DIAGNOSIS — E1151 Type 2 diabetes mellitus with diabetic peripheral angiopathy without gangrene: Secondary | ICD-10-CM | POA: Diagnosis not present

## 2023-12-08 DIAGNOSIS — E1151 Type 2 diabetes mellitus with diabetic peripheral angiopathy without gangrene: Secondary | ICD-10-CM | POA: Diagnosis not present

## 2023-12-08 DIAGNOSIS — I480 Paroxysmal atrial fibrillation: Secondary | ICD-10-CM | POA: Diagnosis not present

## 2023-12-08 DIAGNOSIS — E11319 Type 2 diabetes mellitus with unspecified diabetic retinopathy without macular edema: Secondary | ICD-10-CM | POA: Diagnosis not present

## 2023-12-08 DIAGNOSIS — S51801D Unspecified open wound of right forearm, subsequent encounter: Secondary | ICD-10-CM | POA: Diagnosis not present

## 2023-12-08 DIAGNOSIS — I129 Hypertensive chronic kidney disease with stage 1 through stage 4 chronic kidney disease, or unspecified chronic kidney disease: Secondary | ICD-10-CM | POA: Diagnosis not present

## 2023-12-08 DIAGNOSIS — I2510 Atherosclerotic heart disease of native coronary artery without angina pectoris: Secondary | ICD-10-CM | POA: Diagnosis not present

## 2023-12-08 DIAGNOSIS — N1831 Chronic kidney disease, stage 3a: Secondary | ICD-10-CM | POA: Diagnosis not present

## 2023-12-08 DIAGNOSIS — E1142 Type 2 diabetes mellitus with diabetic polyneuropathy: Secondary | ICD-10-CM | POA: Diagnosis not present

## 2023-12-08 DIAGNOSIS — E1122 Type 2 diabetes mellitus with diabetic chronic kidney disease: Secondary | ICD-10-CM | POA: Diagnosis not present

## 2023-12-10 DIAGNOSIS — I2510 Atherosclerotic heart disease of native coronary artery without angina pectoris: Secondary | ICD-10-CM | POA: Diagnosis not present

## 2023-12-10 DIAGNOSIS — E1122 Type 2 diabetes mellitus with diabetic chronic kidney disease: Secondary | ICD-10-CM | POA: Diagnosis not present

## 2023-12-10 DIAGNOSIS — E1151 Type 2 diabetes mellitus with diabetic peripheral angiopathy without gangrene: Secondary | ICD-10-CM | POA: Diagnosis not present

## 2023-12-10 DIAGNOSIS — E11319 Type 2 diabetes mellitus with unspecified diabetic retinopathy without macular edema: Secondary | ICD-10-CM | POA: Diagnosis not present

## 2023-12-10 DIAGNOSIS — I480 Paroxysmal atrial fibrillation: Secondary | ICD-10-CM | POA: Diagnosis not present

## 2023-12-10 DIAGNOSIS — S51801D Unspecified open wound of right forearm, subsequent encounter: Secondary | ICD-10-CM | POA: Diagnosis not present

## 2023-12-10 DIAGNOSIS — I129 Hypertensive chronic kidney disease with stage 1 through stage 4 chronic kidney disease, or unspecified chronic kidney disease: Secondary | ICD-10-CM | POA: Diagnosis not present

## 2023-12-10 DIAGNOSIS — N1831 Chronic kidney disease, stage 3a: Secondary | ICD-10-CM | POA: Diagnosis not present

## 2023-12-10 DIAGNOSIS — E1142 Type 2 diabetes mellitus with diabetic polyneuropathy: Secondary | ICD-10-CM | POA: Diagnosis not present

## 2023-12-15 DIAGNOSIS — I480 Paroxysmal atrial fibrillation: Secondary | ICD-10-CM | POA: Diagnosis not present

## 2023-12-15 DIAGNOSIS — E1142 Type 2 diabetes mellitus with diabetic polyneuropathy: Secondary | ICD-10-CM | POA: Diagnosis not present

## 2023-12-15 DIAGNOSIS — E1122 Type 2 diabetes mellitus with diabetic chronic kidney disease: Secondary | ICD-10-CM | POA: Diagnosis not present

## 2023-12-15 DIAGNOSIS — I2510 Atherosclerotic heart disease of native coronary artery without angina pectoris: Secondary | ICD-10-CM | POA: Diagnosis not present

## 2023-12-15 DIAGNOSIS — E1151 Type 2 diabetes mellitus with diabetic peripheral angiopathy without gangrene: Secondary | ICD-10-CM | POA: Diagnosis not present

## 2023-12-15 DIAGNOSIS — S51801D Unspecified open wound of right forearm, subsequent encounter: Secondary | ICD-10-CM | POA: Diagnosis not present

## 2023-12-15 DIAGNOSIS — E11319 Type 2 diabetes mellitus with unspecified diabetic retinopathy without macular edema: Secondary | ICD-10-CM | POA: Diagnosis not present

## 2023-12-15 DIAGNOSIS — N1831 Chronic kidney disease, stage 3a: Secondary | ICD-10-CM | POA: Diagnosis not present

## 2023-12-15 DIAGNOSIS — I129 Hypertensive chronic kidney disease with stage 1 through stage 4 chronic kidney disease, or unspecified chronic kidney disease: Secondary | ICD-10-CM | POA: Diagnosis not present

## 2023-12-17 DIAGNOSIS — I2510 Atherosclerotic heart disease of native coronary artery without angina pectoris: Secondary | ICD-10-CM | POA: Diagnosis not present

## 2023-12-17 DIAGNOSIS — I480 Paroxysmal atrial fibrillation: Secondary | ICD-10-CM | POA: Diagnosis not present

## 2023-12-17 DIAGNOSIS — E1142 Type 2 diabetes mellitus with diabetic polyneuropathy: Secondary | ICD-10-CM | POA: Diagnosis not present

## 2023-12-17 DIAGNOSIS — E1122 Type 2 diabetes mellitus with diabetic chronic kidney disease: Secondary | ICD-10-CM | POA: Diagnosis not present

## 2023-12-17 DIAGNOSIS — N1831 Chronic kidney disease, stage 3a: Secondary | ICD-10-CM | POA: Diagnosis not present

## 2023-12-17 DIAGNOSIS — E1151 Type 2 diabetes mellitus with diabetic peripheral angiopathy without gangrene: Secondary | ICD-10-CM | POA: Diagnosis not present

## 2023-12-17 DIAGNOSIS — E11319 Type 2 diabetes mellitus with unspecified diabetic retinopathy without macular edema: Secondary | ICD-10-CM | POA: Diagnosis not present

## 2023-12-17 DIAGNOSIS — I129 Hypertensive chronic kidney disease with stage 1 through stage 4 chronic kidney disease, or unspecified chronic kidney disease: Secondary | ICD-10-CM | POA: Diagnosis not present

## 2023-12-17 DIAGNOSIS — S51801D Unspecified open wound of right forearm, subsequent encounter: Secondary | ICD-10-CM | POA: Diagnosis not present

## 2023-12-18 DIAGNOSIS — I480 Paroxysmal atrial fibrillation: Secondary | ICD-10-CM | POA: Diagnosis not present

## 2023-12-18 DIAGNOSIS — E1151 Type 2 diabetes mellitus with diabetic peripheral angiopathy without gangrene: Secondary | ICD-10-CM | POA: Diagnosis not present

## 2023-12-18 DIAGNOSIS — I2510 Atherosclerotic heart disease of native coronary artery without angina pectoris: Secondary | ICD-10-CM | POA: Diagnosis not present

## 2023-12-18 DIAGNOSIS — E1122 Type 2 diabetes mellitus with diabetic chronic kidney disease: Secondary | ICD-10-CM | POA: Diagnosis not present

## 2023-12-18 DIAGNOSIS — I129 Hypertensive chronic kidney disease with stage 1 through stage 4 chronic kidney disease, or unspecified chronic kidney disease: Secondary | ICD-10-CM | POA: Diagnosis not present

## 2023-12-18 DIAGNOSIS — E1142 Type 2 diabetes mellitus with diabetic polyneuropathy: Secondary | ICD-10-CM | POA: Diagnosis not present

## 2023-12-18 DIAGNOSIS — N1831 Chronic kidney disease, stage 3a: Secondary | ICD-10-CM | POA: Diagnosis not present

## 2023-12-18 DIAGNOSIS — E11319 Type 2 diabetes mellitus with unspecified diabetic retinopathy without macular edema: Secondary | ICD-10-CM | POA: Diagnosis not present

## 2023-12-18 DIAGNOSIS — S51801D Unspecified open wound of right forearm, subsequent encounter: Secondary | ICD-10-CM | POA: Diagnosis not present

## 2023-12-19 DIAGNOSIS — E1142 Type 2 diabetes mellitus with diabetic polyneuropathy: Secondary | ICD-10-CM | POA: Diagnosis not present

## 2023-12-19 DIAGNOSIS — N1831 Chronic kidney disease, stage 3a: Secondary | ICD-10-CM | POA: Diagnosis not present

## 2023-12-19 DIAGNOSIS — E11319 Type 2 diabetes mellitus with unspecified diabetic retinopathy without macular edema: Secondary | ICD-10-CM | POA: Diagnosis not present

## 2023-12-19 DIAGNOSIS — I2510 Atherosclerotic heart disease of native coronary artery without angina pectoris: Secondary | ICD-10-CM | POA: Diagnosis not present

## 2023-12-19 DIAGNOSIS — E1151 Type 2 diabetes mellitus with diabetic peripheral angiopathy without gangrene: Secondary | ICD-10-CM | POA: Diagnosis not present

## 2023-12-19 DIAGNOSIS — I480 Paroxysmal atrial fibrillation: Secondary | ICD-10-CM | POA: Diagnosis not present

## 2023-12-19 DIAGNOSIS — E1122 Type 2 diabetes mellitus with diabetic chronic kidney disease: Secondary | ICD-10-CM | POA: Diagnosis not present

## 2023-12-19 DIAGNOSIS — S51801D Unspecified open wound of right forearm, subsequent encounter: Secondary | ICD-10-CM | POA: Diagnosis not present

## 2023-12-19 DIAGNOSIS — I129 Hypertensive chronic kidney disease with stage 1 through stage 4 chronic kidney disease, or unspecified chronic kidney disease: Secondary | ICD-10-CM | POA: Diagnosis not present

## 2023-12-22 DIAGNOSIS — E1151 Type 2 diabetes mellitus with diabetic peripheral angiopathy without gangrene: Secondary | ICD-10-CM | POA: Diagnosis not present

## 2023-12-22 DIAGNOSIS — S51801D Unspecified open wound of right forearm, subsequent encounter: Secondary | ICD-10-CM | POA: Diagnosis not present

## 2023-12-22 DIAGNOSIS — N1831 Chronic kidney disease, stage 3a: Secondary | ICD-10-CM | POA: Diagnosis not present

## 2023-12-22 DIAGNOSIS — I129 Hypertensive chronic kidney disease with stage 1 through stage 4 chronic kidney disease, or unspecified chronic kidney disease: Secondary | ICD-10-CM | POA: Diagnosis not present

## 2023-12-22 DIAGNOSIS — I480 Paroxysmal atrial fibrillation: Secondary | ICD-10-CM | POA: Diagnosis not present

## 2023-12-22 DIAGNOSIS — E1142 Type 2 diabetes mellitus with diabetic polyneuropathy: Secondary | ICD-10-CM | POA: Diagnosis not present

## 2023-12-22 DIAGNOSIS — E1122 Type 2 diabetes mellitus with diabetic chronic kidney disease: Secondary | ICD-10-CM | POA: Diagnosis not present

## 2023-12-22 DIAGNOSIS — I2510 Atherosclerotic heart disease of native coronary artery without angina pectoris: Secondary | ICD-10-CM | POA: Diagnosis not present

## 2023-12-22 DIAGNOSIS — E11319 Type 2 diabetes mellitus with unspecified diabetic retinopathy without macular edema: Secondary | ICD-10-CM | POA: Diagnosis not present

## 2023-12-24 DIAGNOSIS — I480 Paroxysmal atrial fibrillation: Secondary | ICD-10-CM | POA: Diagnosis not present

## 2023-12-24 DIAGNOSIS — E1142 Type 2 diabetes mellitus with diabetic polyneuropathy: Secondary | ICD-10-CM | POA: Diagnosis not present

## 2023-12-24 DIAGNOSIS — E1122 Type 2 diabetes mellitus with diabetic chronic kidney disease: Secondary | ICD-10-CM | POA: Diagnosis not present

## 2023-12-24 DIAGNOSIS — I2510 Atherosclerotic heart disease of native coronary artery without angina pectoris: Secondary | ICD-10-CM | POA: Diagnosis not present

## 2023-12-24 DIAGNOSIS — E11319 Type 2 diabetes mellitus with unspecified diabetic retinopathy without macular edema: Secondary | ICD-10-CM | POA: Diagnosis not present

## 2023-12-24 DIAGNOSIS — I129 Hypertensive chronic kidney disease with stage 1 through stage 4 chronic kidney disease, or unspecified chronic kidney disease: Secondary | ICD-10-CM | POA: Diagnosis not present

## 2023-12-24 DIAGNOSIS — S51801D Unspecified open wound of right forearm, subsequent encounter: Secondary | ICD-10-CM | POA: Diagnosis not present

## 2023-12-24 DIAGNOSIS — E1151 Type 2 diabetes mellitus with diabetic peripheral angiopathy without gangrene: Secondary | ICD-10-CM | POA: Diagnosis not present

## 2023-12-24 DIAGNOSIS — N1831 Chronic kidney disease, stage 3a: Secondary | ICD-10-CM | POA: Diagnosis not present

## 2023-12-26 DIAGNOSIS — I129 Hypertensive chronic kidney disease with stage 1 through stage 4 chronic kidney disease, or unspecified chronic kidney disease: Secondary | ICD-10-CM | POA: Diagnosis not present

## 2023-12-26 DIAGNOSIS — E11319 Type 2 diabetes mellitus with unspecified diabetic retinopathy without macular edema: Secondary | ICD-10-CM | POA: Diagnosis not present

## 2023-12-26 DIAGNOSIS — E1151 Type 2 diabetes mellitus with diabetic peripheral angiopathy without gangrene: Secondary | ICD-10-CM | POA: Diagnosis not present

## 2023-12-26 DIAGNOSIS — N1831 Chronic kidney disease, stage 3a: Secondary | ICD-10-CM | POA: Diagnosis not present

## 2023-12-26 DIAGNOSIS — E1142 Type 2 diabetes mellitus with diabetic polyneuropathy: Secondary | ICD-10-CM | POA: Diagnosis not present

## 2023-12-26 DIAGNOSIS — I2510 Atherosclerotic heart disease of native coronary artery without angina pectoris: Secondary | ICD-10-CM | POA: Diagnosis not present

## 2023-12-26 DIAGNOSIS — I480 Paroxysmal atrial fibrillation: Secondary | ICD-10-CM | POA: Diagnosis not present

## 2023-12-26 DIAGNOSIS — E1122 Type 2 diabetes mellitus with diabetic chronic kidney disease: Secondary | ICD-10-CM | POA: Diagnosis not present

## 2023-12-26 DIAGNOSIS — S51801D Unspecified open wound of right forearm, subsequent encounter: Secondary | ICD-10-CM | POA: Diagnosis not present

## 2023-12-29 DIAGNOSIS — I129 Hypertensive chronic kidney disease with stage 1 through stage 4 chronic kidney disease, or unspecified chronic kidney disease: Secondary | ICD-10-CM | POA: Diagnosis not present

## 2023-12-29 DIAGNOSIS — N1831 Chronic kidney disease, stage 3a: Secondary | ICD-10-CM | POA: Diagnosis not present

## 2023-12-29 DIAGNOSIS — E11319 Type 2 diabetes mellitus with unspecified diabetic retinopathy without macular edema: Secondary | ICD-10-CM | POA: Diagnosis not present

## 2023-12-29 DIAGNOSIS — E1142 Type 2 diabetes mellitus with diabetic polyneuropathy: Secondary | ICD-10-CM | POA: Diagnosis not present

## 2023-12-29 DIAGNOSIS — I480 Paroxysmal atrial fibrillation: Secondary | ICD-10-CM | POA: Diagnosis not present

## 2023-12-29 DIAGNOSIS — E1122 Type 2 diabetes mellitus with diabetic chronic kidney disease: Secondary | ICD-10-CM | POA: Diagnosis not present

## 2023-12-29 DIAGNOSIS — S51801D Unspecified open wound of right forearm, subsequent encounter: Secondary | ICD-10-CM | POA: Diagnosis not present

## 2023-12-29 DIAGNOSIS — E1151 Type 2 diabetes mellitus with diabetic peripheral angiopathy without gangrene: Secondary | ICD-10-CM | POA: Diagnosis not present

## 2023-12-29 DIAGNOSIS — I2510 Atherosclerotic heart disease of native coronary artery without angina pectoris: Secondary | ICD-10-CM | POA: Diagnosis not present

## 2023-12-30 DIAGNOSIS — E1142 Type 2 diabetes mellitus with diabetic polyneuropathy: Secondary | ICD-10-CM | POA: Diagnosis not present

## 2023-12-30 DIAGNOSIS — S51801D Unspecified open wound of right forearm, subsequent encounter: Secondary | ICD-10-CM | POA: Diagnosis not present

## 2023-12-30 DIAGNOSIS — E11319 Type 2 diabetes mellitus with unspecified diabetic retinopathy without macular edema: Secondary | ICD-10-CM | POA: Diagnosis not present

## 2023-12-30 DIAGNOSIS — I129 Hypertensive chronic kidney disease with stage 1 through stage 4 chronic kidney disease, or unspecified chronic kidney disease: Secondary | ICD-10-CM | POA: Diagnosis not present

## 2023-12-30 DIAGNOSIS — E1151 Type 2 diabetes mellitus with diabetic peripheral angiopathy without gangrene: Secondary | ICD-10-CM | POA: Diagnosis not present

## 2023-12-30 DIAGNOSIS — I2510 Atherosclerotic heart disease of native coronary artery without angina pectoris: Secondary | ICD-10-CM | POA: Diagnosis not present

## 2023-12-30 DIAGNOSIS — E1122 Type 2 diabetes mellitus with diabetic chronic kidney disease: Secondary | ICD-10-CM | POA: Diagnosis not present

## 2023-12-30 DIAGNOSIS — N1831 Chronic kidney disease, stage 3a: Secondary | ICD-10-CM | POA: Diagnosis not present

## 2023-12-30 DIAGNOSIS — I480 Paroxysmal atrial fibrillation: Secondary | ICD-10-CM | POA: Diagnosis not present

## 2023-12-31 DIAGNOSIS — E1122 Type 2 diabetes mellitus with diabetic chronic kidney disease: Secondary | ICD-10-CM | POA: Diagnosis not present

## 2023-12-31 DIAGNOSIS — S51801D Unspecified open wound of right forearm, subsequent encounter: Secondary | ICD-10-CM | POA: Diagnosis not present

## 2023-12-31 DIAGNOSIS — E1151 Type 2 diabetes mellitus with diabetic peripheral angiopathy without gangrene: Secondary | ICD-10-CM | POA: Diagnosis not present

## 2023-12-31 DIAGNOSIS — I480 Paroxysmal atrial fibrillation: Secondary | ICD-10-CM | POA: Diagnosis not present

## 2023-12-31 DIAGNOSIS — E11319 Type 2 diabetes mellitus with unspecified diabetic retinopathy without macular edema: Secondary | ICD-10-CM | POA: Diagnosis not present

## 2023-12-31 DIAGNOSIS — I129 Hypertensive chronic kidney disease with stage 1 through stage 4 chronic kidney disease, or unspecified chronic kidney disease: Secondary | ICD-10-CM | POA: Diagnosis not present

## 2023-12-31 DIAGNOSIS — N1831 Chronic kidney disease, stage 3a: Secondary | ICD-10-CM | POA: Diagnosis not present

## 2023-12-31 DIAGNOSIS — E1142 Type 2 diabetes mellitus with diabetic polyneuropathy: Secondary | ICD-10-CM | POA: Diagnosis not present

## 2023-12-31 DIAGNOSIS — I2510 Atherosclerotic heart disease of native coronary artery without angina pectoris: Secondary | ICD-10-CM | POA: Diagnosis not present

## 2024-01-02 DIAGNOSIS — E1142 Type 2 diabetes mellitus with diabetic polyneuropathy: Secondary | ICD-10-CM | POA: Diagnosis not present

## 2024-01-02 DIAGNOSIS — S51801D Unspecified open wound of right forearm, subsequent encounter: Secondary | ICD-10-CM | POA: Diagnosis not present

## 2024-01-02 DIAGNOSIS — E11319 Type 2 diabetes mellitus with unspecified diabetic retinopathy without macular edema: Secondary | ICD-10-CM | POA: Diagnosis not present

## 2024-01-02 DIAGNOSIS — I2510 Atherosclerotic heart disease of native coronary artery without angina pectoris: Secondary | ICD-10-CM | POA: Diagnosis not present

## 2024-01-02 DIAGNOSIS — E1122 Type 2 diabetes mellitus with diabetic chronic kidney disease: Secondary | ICD-10-CM | POA: Diagnosis not present

## 2024-01-02 DIAGNOSIS — E1151 Type 2 diabetes mellitus with diabetic peripheral angiopathy without gangrene: Secondary | ICD-10-CM | POA: Diagnosis not present

## 2024-01-02 DIAGNOSIS — I480 Paroxysmal atrial fibrillation: Secondary | ICD-10-CM | POA: Diagnosis not present

## 2024-01-02 DIAGNOSIS — N1831 Chronic kidney disease, stage 3a: Secondary | ICD-10-CM | POA: Diagnosis not present

## 2024-01-02 DIAGNOSIS — I129 Hypertensive chronic kidney disease with stage 1 through stage 4 chronic kidney disease, or unspecified chronic kidney disease: Secondary | ICD-10-CM | POA: Diagnosis not present

## 2024-01-05 DIAGNOSIS — E1151 Type 2 diabetes mellitus with diabetic peripheral angiopathy without gangrene: Secondary | ICD-10-CM | POA: Diagnosis not present

## 2024-01-05 DIAGNOSIS — I2510 Atherosclerotic heart disease of native coronary artery without angina pectoris: Secondary | ICD-10-CM | POA: Diagnosis not present

## 2024-01-05 DIAGNOSIS — E1142 Type 2 diabetes mellitus with diabetic polyneuropathy: Secondary | ICD-10-CM | POA: Diagnosis not present

## 2024-01-05 DIAGNOSIS — N1831 Chronic kidney disease, stage 3a: Secondary | ICD-10-CM | POA: Diagnosis not present

## 2024-01-05 DIAGNOSIS — E11319 Type 2 diabetes mellitus with unspecified diabetic retinopathy without macular edema: Secondary | ICD-10-CM | POA: Diagnosis not present

## 2024-01-05 DIAGNOSIS — I480 Paroxysmal atrial fibrillation: Secondary | ICD-10-CM | POA: Diagnosis not present

## 2024-01-05 DIAGNOSIS — E1122 Type 2 diabetes mellitus with diabetic chronic kidney disease: Secondary | ICD-10-CM | POA: Diagnosis not present

## 2024-01-05 DIAGNOSIS — I129 Hypertensive chronic kidney disease with stage 1 through stage 4 chronic kidney disease, or unspecified chronic kidney disease: Secondary | ICD-10-CM | POA: Diagnosis not present

## 2024-01-05 DIAGNOSIS — S51801D Unspecified open wound of right forearm, subsequent encounter: Secondary | ICD-10-CM | POA: Diagnosis not present

## 2024-01-07 DIAGNOSIS — I480 Paroxysmal atrial fibrillation: Secondary | ICD-10-CM | POA: Diagnosis not present

## 2024-01-07 DIAGNOSIS — E11319 Type 2 diabetes mellitus with unspecified diabetic retinopathy without macular edema: Secondary | ICD-10-CM | POA: Diagnosis not present

## 2024-01-07 DIAGNOSIS — E1151 Type 2 diabetes mellitus with diabetic peripheral angiopathy without gangrene: Secondary | ICD-10-CM | POA: Diagnosis not present

## 2024-01-07 DIAGNOSIS — N1831 Chronic kidney disease, stage 3a: Secondary | ICD-10-CM | POA: Diagnosis not present

## 2024-01-07 DIAGNOSIS — I2510 Atherosclerotic heart disease of native coronary artery without angina pectoris: Secondary | ICD-10-CM | POA: Diagnosis not present

## 2024-01-07 DIAGNOSIS — E1122 Type 2 diabetes mellitus with diabetic chronic kidney disease: Secondary | ICD-10-CM | POA: Diagnosis not present

## 2024-01-07 DIAGNOSIS — S51801D Unspecified open wound of right forearm, subsequent encounter: Secondary | ICD-10-CM | POA: Diagnosis not present

## 2024-01-07 DIAGNOSIS — I129 Hypertensive chronic kidney disease with stage 1 through stage 4 chronic kidney disease, or unspecified chronic kidney disease: Secondary | ICD-10-CM | POA: Diagnosis not present

## 2024-01-07 DIAGNOSIS — E1142 Type 2 diabetes mellitus with diabetic polyneuropathy: Secondary | ICD-10-CM | POA: Diagnosis not present

## 2024-01-08 DIAGNOSIS — E1151 Type 2 diabetes mellitus with diabetic peripheral angiopathy without gangrene: Secondary | ICD-10-CM | POA: Diagnosis not present

## 2024-01-08 DIAGNOSIS — I480 Paroxysmal atrial fibrillation: Secondary | ICD-10-CM | POA: Diagnosis not present

## 2024-01-08 DIAGNOSIS — S51801D Unspecified open wound of right forearm, subsequent encounter: Secondary | ICD-10-CM | POA: Diagnosis not present

## 2024-01-08 DIAGNOSIS — N1831 Chronic kidney disease, stage 3a: Secondary | ICD-10-CM | POA: Diagnosis not present

## 2024-01-08 DIAGNOSIS — I2510 Atherosclerotic heart disease of native coronary artery without angina pectoris: Secondary | ICD-10-CM | POA: Diagnosis not present

## 2024-01-08 DIAGNOSIS — E1142 Type 2 diabetes mellitus with diabetic polyneuropathy: Secondary | ICD-10-CM | POA: Diagnosis not present

## 2024-01-08 DIAGNOSIS — E1122 Type 2 diabetes mellitus with diabetic chronic kidney disease: Secondary | ICD-10-CM | POA: Diagnosis not present

## 2024-01-08 DIAGNOSIS — I129 Hypertensive chronic kidney disease with stage 1 through stage 4 chronic kidney disease, or unspecified chronic kidney disease: Secondary | ICD-10-CM | POA: Diagnosis not present

## 2024-01-08 DIAGNOSIS — E11319 Type 2 diabetes mellitus with unspecified diabetic retinopathy without macular edema: Secondary | ICD-10-CM | POA: Diagnosis not present

## 2024-01-12 DIAGNOSIS — I480 Paroxysmal atrial fibrillation: Secondary | ICD-10-CM | POA: Diagnosis not present

## 2024-01-12 DIAGNOSIS — S51801D Unspecified open wound of right forearm, subsequent encounter: Secondary | ICD-10-CM | POA: Diagnosis not present

## 2024-01-12 DIAGNOSIS — I2510 Atherosclerotic heart disease of native coronary artery without angina pectoris: Secondary | ICD-10-CM | POA: Diagnosis not present

## 2024-01-12 DIAGNOSIS — N1831 Chronic kidney disease, stage 3a: Secondary | ICD-10-CM | POA: Diagnosis not present

## 2024-01-12 DIAGNOSIS — E1142 Type 2 diabetes mellitus with diabetic polyneuropathy: Secondary | ICD-10-CM | POA: Diagnosis not present

## 2024-01-12 DIAGNOSIS — E11319 Type 2 diabetes mellitus with unspecified diabetic retinopathy without macular edema: Secondary | ICD-10-CM | POA: Diagnosis not present

## 2024-01-12 DIAGNOSIS — I129 Hypertensive chronic kidney disease with stage 1 through stage 4 chronic kidney disease, or unspecified chronic kidney disease: Secondary | ICD-10-CM | POA: Diagnosis not present

## 2024-01-12 DIAGNOSIS — E1151 Type 2 diabetes mellitus with diabetic peripheral angiopathy without gangrene: Secondary | ICD-10-CM | POA: Diagnosis not present

## 2024-01-12 DIAGNOSIS — E1122 Type 2 diabetes mellitus with diabetic chronic kidney disease: Secondary | ICD-10-CM | POA: Diagnosis not present

## 2024-01-14 DIAGNOSIS — I2510 Atherosclerotic heart disease of native coronary artery without angina pectoris: Secondary | ICD-10-CM | POA: Diagnosis not present

## 2024-01-14 DIAGNOSIS — E1122 Type 2 diabetes mellitus with diabetic chronic kidney disease: Secondary | ICD-10-CM | POA: Diagnosis not present

## 2024-01-14 DIAGNOSIS — I480 Paroxysmal atrial fibrillation: Secondary | ICD-10-CM | POA: Diagnosis not present

## 2024-01-14 DIAGNOSIS — E11319 Type 2 diabetes mellitus with unspecified diabetic retinopathy without macular edema: Secondary | ICD-10-CM | POA: Diagnosis not present

## 2024-01-14 DIAGNOSIS — E1142 Type 2 diabetes mellitus with diabetic polyneuropathy: Secondary | ICD-10-CM | POA: Diagnosis not present

## 2024-01-14 DIAGNOSIS — E1151 Type 2 diabetes mellitus with diabetic peripheral angiopathy without gangrene: Secondary | ICD-10-CM | POA: Diagnosis not present

## 2024-01-14 DIAGNOSIS — S51801D Unspecified open wound of right forearm, subsequent encounter: Secondary | ICD-10-CM | POA: Diagnosis not present

## 2024-01-14 DIAGNOSIS — I129 Hypertensive chronic kidney disease with stage 1 through stage 4 chronic kidney disease, or unspecified chronic kidney disease: Secondary | ICD-10-CM | POA: Diagnosis not present

## 2024-01-14 DIAGNOSIS — N1831 Chronic kidney disease, stage 3a: Secondary | ICD-10-CM | POA: Diagnosis not present

## 2024-01-15 DIAGNOSIS — N1831 Chronic kidney disease, stage 3a: Secondary | ICD-10-CM | POA: Diagnosis not present

## 2024-01-15 DIAGNOSIS — E1142 Type 2 diabetes mellitus with diabetic polyneuropathy: Secondary | ICD-10-CM | POA: Diagnosis not present

## 2024-01-15 DIAGNOSIS — I480 Paroxysmal atrial fibrillation: Secondary | ICD-10-CM | POA: Diagnosis not present

## 2024-01-15 DIAGNOSIS — I129 Hypertensive chronic kidney disease with stage 1 through stage 4 chronic kidney disease, or unspecified chronic kidney disease: Secondary | ICD-10-CM | POA: Diagnosis not present

## 2024-01-15 DIAGNOSIS — E1151 Type 2 diabetes mellitus with diabetic peripheral angiopathy without gangrene: Secondary | ICD-10-CM | POA: Diagnosis not present

## 2024-01-15 DIAGNOSIS — S51801D Unspecified open wound of right forearm, subsequent encounter: Secondary | ICD-10-CM | POA: Diagnosis not present

## 2024-01-15 DIAGNOSIS — I2510 Atherosclerotic heart disease of native coronary artery without angina pectoris: Secondary | ICD-10-CM | POA: Diagnosis not present

## 2024-01-15 DIAGNOSIS — E1122 Type 2 diabetes mellitus with diabetic chronic kidney disease: Secondary | ICD-10-CM | POA: Diagnosis not present

## 2024-01-15 DIAGNOSIS — E11319 Type 2 diabetes mellitus with unspecified diabetic retinopathy without macular edema: Secondary | ICD-10-CM | POA: Diagnosis not present

## 2024-01-16 DIAGNOSIS — E1122 Type 2 diabetes mellitus with diabetic chronic kidney disease: Secondary | ICD-10-CM | POA: Diagnosis not present

## 2024-01-16 DIAGNOSIS — N1831 Chronic kidney disease, stage 3a: Secondary | ICD-10-CM | POA: Diagnosis not present

## 2024-01-16 DIAGNOSIS — E1142 Type 2 diabetes mellitus with diabetic polyneuropathy: Secondary | ICD-10-CM | POA: Diagnosis not present

## 2024-01-16 DIAGNOSIS — S51801D Unspecified open wound of right forearm, subsequent encounter: Secondary | ICD-10-CM | POA: Diagnosis not present

## 2024-01-16 DIAGNOSIS — E1151 Type 2 diabetes mellitus with diabetic peripheral angiopathy without gangrene: Secondary | ICD-10-CM | POA: Diagnosis not present

## 2024-01-16 DIAGNOSIS — I129 Hypertensive chronic kidney disease with stage 1 through stage 4 chronic kidney disease, or unspecified chronic kidney disease: Secondary | ICD-10-CM | POA: Diagnosis not present

## 2024-01-16 DIAGNOSIS — I480 Paroxysmal atrial fibrillation: Secondary | ICD-10-CM | POA: Diagnosis not present

## 2024-01-16 DIAGNOSIS — I2510 Atherosclerotic heart disease of native coronary artery without angina pectoris: Secondary | ICD-10-CM | POA: Diagnosis not present

## 2024-01-16 DIAGNOSIS — E11319 Type 2 diabetes mellitus with unspecified diabetic retinopathy without macular edema: Secondary | ICD-10-CM | POA: Diagnosis not present

## 2024-01-19 DIAGNOSIS — I129 Hypertensive chronic kidney disease with stage 1 through stage 4 chronic kidney disease, or unspecified chronic kidney disease: Secondary | ICD-10-CM | POA: Diagnosis not present

## 2024-01-19 DIAGNOSIS — I2510 Atherosclerotic heart disease of native coronary artery without angina pectoris: Secondary | ICD-10-CM | POA: Diagnosis not present

## 2024-01-19 DIAGNOSIS — N1831 Chronic kidney disease, stage 3a: Secondary | ICD-10-CM | POA: Diagnosis not present

## 2024-01-19 DIAGNOSIS — E1151 Type 2 diabetes mellitus with diabetic peripheral angiopathy without gangrene: Secondary | ICD-10-CM | POA: Diagnosis not present

## 2024-01-19 DIAGNOSIS — S51801D Unspecified open wound of right forearm, subsequent encounter: Secondary | ICD-10-CM | POA: Diagnosis not present

## 2024-01-19 DIAGNOSIS — E1142 Type 2 diabetes mellitus with diabetic polyneuropathy: Secondary | ICD-10-CM | POA: Diagnosis not present

## 2024-01-19 DIAGNOSIS — E11319 Type 2 diabetes mellitus with unspecified diabetic retinopathy without macular edema: Secondary | ICD-10-CM | POA: Diagnosis not present

## 2024-01-19 DIAGNOSIS — I480 Paroxysmal atrial fibrillation: Secondary | ICD-10-CM | POA: Diagnosis not present

## 2024-01-19 DIAGNOSIS — E1122 Type 2 diabetes mellitus with diabetic chronic kidney disease: Secondary | ICD-10-CM | POA: Diagnosis not present

## 2024-01-20 DIAGNOSIS — N1831 Chronic kidney disease, stage 3a: Secondary | ICD-10-CM | POA: Diagnosis not present

## 2024-01-20 DIAGNOSIS — E11319 Type 2 diabetes mellitus with unspecified diabetic retinopathy without macular edema: Secondary | ICD-10-CM | POA: Diagnosis not present

## 2024-01-20 DIAGNOSIS — I2510 Atherosclerotic heart disease of native coronary artery without angina pectoris: Secondary | ICD-10-CM | POA: Diagnosis not present

## 2024-01-20 DIAGNOSIS — I129 Hypertensive chronic kidney disease with stage 1 through stage 4 chronic kidney disease, or unspecified chronic kidney disease: Secondary | ICD-10-CM | POA: Diagnosis not present

## 2024-01-20 DIAGNOSIS — I480 Paroxysmal atrial fibrillation: Secondary | ICD-10-CM | POA: Diagnosis not present

## 2024-01-20 DIAGNOSIS — E1122 Type 2 diabetes mellitus with diabetic chronic kidney disease: Secondary | ICD-10-CM | POA: Diagnosis not present

## 2024-01-20 DIAGNOSIS — E1151 Type 2 diabetes mellitus with diabetic peripheral angiopathy without gangrene: Secondary | ICD-10-CM | POA: Diagnosis not present

## 2024-01-20 DIAGNOSIS — E1142 Type 2 diabetes mellitus with diabetic polyneuropathy: Secondary | ICD-10-CM | POA: Diagnosis not present

## 2024-01-20 DIAGNOSIS — S51801D Unspecified open wound of right forearm, subsequent encounter: Secondary | ICD-10-CM | POA: Diagnosis not present

## 2024-01-21 DIAGNOSIS — E1151 Type 2 diabetes mellitus with diabetic peripheral angiopathy without gangrene: Secondary | ICD-10-CM | POA: Diagnosis not present

## 2024-01-21 DIAGNOSIS — S51801D Unspecified open wound of right forearm, subsequent encounter: Secondary | ICD-10-CM | POA: Diagnosis not present

## 2024-01-21 DIAGNOSIS — I2510 Atherosclerotic heart disease of native coronary artery without angina pectoris: Secondary | ICD-10-CM | POA: Diagnosis not present

## 2024-01-21 DIAGNOSIS — E11319 Type 2 diabetes mellitus with unspecified diabetic retinopathy without macular edema: Secondary | ICD-10-CM | POA: Diagnosis not present

## 2024-01-21 DIAGNOSIS — I480 Paroxysmal atrial fibrillation: Secondary | ICD-10-CM | POA: Diagnosis not present

## 2024-01-21 DIAGNOSIS — I129 Hypertensive chronic kidney disease with stage 1 through stage 4 chronic kidney disease, or unspecified chronic kidney disease: Secondary | ICD-10-CM | POA: Diagnosis not present

## 2024-01-21 DIAGNOSIS — E1122 Type 2 diabetes mellitus with diabetic chronic kidney disease: Secondary | ICD-10-CM | POA: Diagnosis not present

## 2024-01-21 DIAGNOSIS — E1142 Type 2 diabetes mellitus with diabetic polyneuropathy: Secondary | ICD-10-CM | POA: Diagnosis not present

## 2024-01-21 DIAGNOSIS — N1831 Chronic kidney disease, stage 3a: Secondary | ICD-10-CM | POA: Diagnosis not present

## 2024-01-22 ENCOUNTER — Ambulatory Visit: Payer: Medicare PPO | Admitting: Podiatry

## 2024-01-22 ENCOUNTER — Encounter: Payer: Self-pay | Admitting: Podiatry

## 2024-01-22 DIAGNOSIS — B351 Tinea unguium: Secondary | ICD-10-CM | POA: Diagnosis not present

## 2024-01-22 DIAGNOSIS — M79674 Pain in right toe(s): Secondary | ICD-10-CM | POA: Diagnosis not present

## 2024-01-22 DIAGNOSIS — M79675 Pain in left toe(s): Secondary | ICD-10-CM

## 2024-01-22 DIAGNOSIS — E1142 Type 2 diabetes mellitus with diabetic polyneuropathy: Secondary | ICD-10-CM

## 2024-01-22 NOTE — Progress Notes (Signed)
 This patient presents to the office with chief complaint of long thick nails and diabetic feet.  This patient  says there  is  no pain and discomfort in their feet.  This patient says there are long thick painful nails.  These nails are painful walking and wearing shoes.  This patient presents  to the office today for treatment of the  long nails and a foot evaluation due to history of  diabetes.  General Appearance  Alert, conversant and in no acute stress.  Vascular  Dorsalis pedis and posterior tibial  pulses are absent  bilaterally.  Capillary return is within normal limits  bilaterally. Temperature is within normal limits  bilaterally.Venous stasis legs  B/L.  Neurologic  Senn-Weinstein monofilament wire test diminished  bilaterally. Muscle power within normal limits bilaterally.  Nails Thick disfigured discolored nails with subungual debris  hallux nails  bilaterally. No evidence of bacterial infection or drainage bilaterally.  Orthopedic  No limitations of motion of motion feet .  No crepitus or effusions noted.  No bony pathology or digital deformities noted. Midfoot  DJD.  Mild HAV  B/L.  Skin  normotropic skin with no porokeratosis noted bilaterally.  No signs of infections or ulcers noted.      Diabetes with vascular and neurologic pathology.     A diabetic foot exam was performed and there is evidence of any vascular or neurologic pathology.  Debride hallux nails  B/L.  RTC 3 months.   Helane Gunther DPM

## 2024-01-23 DIAGNOSIS — S51801D Unspecified open wound of right forearm, subsequent encounter: Secondary | ICD-10-CM | POA: Diagnosis not present

## 2024-01-23 DIAGNOSIS — E1142 Type 2 diabetes mellitus with diabetic polyneuropathy: Secondary | ICD-10-CM | POA: Diagnosis not present

## 2024-01-23 DIAGNOSIS — I480 Paroxysmal atrial fibrillation: Secondary | ICD-10-CM | POA: Diagnosis not present

## 2024-01-23 DIAGNOSIS — E1122 Type 2 diabetes mellitus with diabetic chronic kidney disease: Secondary | ICD-10-CM | POA: Diagnosis not present

## 2024-01-23 DIAGNOSIS — E11319 Type 2 diabetes mellitus with unspecified diabetic retinopathy without macular edema: Secondary | ICD-10-CM | POA: Diagnosis not present

## 2024-01-23 DIAGNOSIS — E1151 Type 2 diabetes mellitus with diabetic peripheral angiopathy without gangrene: Secondary | ICD-10-CM | POA: Diagnosis not present

## 2024-01-23 DIAGNOSIS — I129 Hypertensive chronic kidney disease with stage 1 through stage 4 chronic kidney disease, or unspecified chronic kidney disease: Secondary | ICD-10-CM | POA: Diagnosis not present

## 2024-01-23 DIAGNOSIS — N1831 Chronic kidney disease, stage 3a: Secondary | ICD-10-CM | POA: Diagnosis not present

## 2024-01-23 DIAGNOSIS — I2510 Atherosclerotic heart disease of native coronary artery without angina pectoris: Secondary | ICD-10-CM | POA: Diagnosis not present

## 2024-01-26 DIAGNOSIS — I129 Hypertensive chronic kidney disease with stage 1 through stage 4 chronic kidney disease, or unspecified chronic kidney disease: Secondary | ICD-10-CM | POA: Diagnosis not present

## 2024-01-26 DIAGNOSIS — E1142 Type 2 diabetes mellitus with diabetic polyneuropathy: Secondary | ICD-10-CM | POA: Diagnosis not present

## 2024-01-26 DIAGNOSIS — I2510 Atherosclerotic heart disease of native coronary artery without angina pectoris: Secondary | ICD-10-CM | POA: Diagnosis not present

## 2024-01-26 DIAGNOSIS — E1122 Type 2 diabetes mellitus with diabetic chronic kidney disease: Secondary | ICD-10-CM | POA: Diagnosis not present

## 2024-01-26 DIAGNOSIS — E1151 Type 2 diabetes mellitus with diabetic peripheral angiopathy without gangrene: Secondary | ICD-10-CM | POA: Diagnosis not present

## 2024-01-26 DIAGNOSIS — I480 Paroxysmal atrial fibrillation: Secondary | ICD-10-CM | POA: Diagnosis not present

## 2024-01-26 DIAGNOSIS — E11319 Type 2 diabetes mellitus with unspecified diabetic retinopathy without macular edema: Secondary | ICD-10-CM | POA: Diagnosis not present

## 2024-01-26 DIAGNOSIS — S51801D Unspecified open wound of right forearm, subsequent encounter: Secondary | ICD-10-CM | POA: Diagnosis not present

## 2024-01-26 DIAGNOSIS — N1831 Chronic kidney disease, stage 3a: Secondary | ICD-10-CM | POA: Diagnosis not present

## 2024-01-27 DIAGNOSIS — E1142 Type 2 diabetes mellitus with diabetic polyneuropathy: Secondary | ICD-10-CM | POA: Diagnosis not present

## 2024-01-27 DIAGNOSIS — E1151 Type 2 diabetes mellitus with diabetic peripheral angiopathy without gangrene: Secondary | ICD-10-CM | POA: Diagnosis not present

## 2024-01-27 DIAGNOSIS — S51801D Unspecified open wound of right forearm, subsequent encounter: Secondary | ICD-10-CM | POA: Diagnosis not present

## 2024-01-27 DIAGNOSIS — I2510 Atherosclerotic heart disease of native coronary artery without angina pectoris: Secondary | ICD-10-CM | POA: Diagnosis not present

## 2024-01-27 DIAGNOSIS — N1831 Chronic kidney disease, stage 3a: Secondary | ICD-10-CM | POA: Diagnosis not present

## 2024-01-27 DIAGNOSIS — I480 Paroxysmal atrial fibrillation: Secondary | ICD-10-CM | POA: Diagnosis not present

## 2024-01-27 DIAGNOSIS — I129 Hypertensive chronic kidney disease with stage 1 through stage 4 chronic kidney disease, or unspecified chronic kidney disease: Secondary | ICD-10-CM | POA: Diagnosis not present

## 2024-01-27 DIAGNOSIS — E1122 Type 2 diabetes mellitus with diabetic chronic kidney disease: Secondary | ICD-10-CM | POA: Diagnosis not present

## 2024-01-27 DIAGNOSIS — E11319 Type 2 diabetes mellitus with unspecified diabetic retinopathy without macular edema: Secondary | ICD-10-CM | POA: Diagnosis not present

## 2024-01-28 DIAGNOSIS — I480 Paroxysmal atrial fibrillation: Secondary | ICD-10-CM | POA: Diagnosis not present

## 2024-01-28 DIAGNOSIS — E11319 Type 2 diabetes mellitus with unspecified diabetic retinopathy without macular edema: Secondary | ICD-10-CM | POA: Diagnosis not present

## 2024-01-28 DIAGNOSIS — E1151 Type 2 diabetes mellitus with diabetic peripheral angiopathy without gangrene: Secondary | ICD-10-CM | POA: Diagnosis not present

## 2024-01-28 DIAGNOSIS — E1142 Type 2 diabetes mellitus with diabetic polyneuropathy: Secondary | ICD-10-CM | POA: Diagnosis not present

## 2024-01-28 DIAGNOSIS — E1122 Type 2 diabetes mellitus with diabetic chronic kidney disease: Secondary | ICD-10-CM | POA: Diagnosis not present

## 2024-01-28 DIAGNOSIS — S51801D Unspecified open wound of right forearm, subsequent encounter: Secondary | ICD-10-CM | POA: Diagnosis not present

## 2024-01-28 DIAGNOSIS — I129 Hypertensive chronic kidney disease with stage 1 through stage 4 chronic kidney disease, or unspecified chronic kidney disease: Secondary | ICD-10-CM | POA: Diagnosis not present

## 2024-01-28 DIAGNOSIS — I2510 Atherosclerotic heart disease of native coronary artery without angina pectoris: Secondary | ICD-10-CM | POA: Diagnosis not present

## 2024-01-28 DIAGNOSIS — N1831 Chronic kidney disease, stage 3a: Secondary | ICD-10-CM | POA: Diagnosis not present

## 2024-01-30 DIAGNOSIS — I2510 Atherosclerotic heart disease of native coronary artery without angina pectoris: Secondary | ICD-10-CM | POA: Diagnosis not present

## 2024-01-30 DIAGNOSIS — N1831 Chronic kidney disease, stage 3a: Secondary | ICD-10-CM | POA: Diagnosis not present

## 2024-01-30 DIAGNOSIS — S51801D Unspecified open wound of right forearm, subsequent encounter: Secondary | ICD-10-CM | POA: Diagnosis not present

## 2024-01-30 DIAGNOSIS — E11319 Type 2 diabetes mellitus with unspecified diabetic retinopathy without macular edema: Secondary | ICD-10-CM | POA: Diagnosis not present

## 2024-01-30 DIAGNOSIS — E1142 Type 2 diabetes mellitus with diabetic polyneuropathy: Secondary | ICD-10-CM | POA: Diagnosis not present

## 2024-01-30 DIAGNOSIS — I129 Hypertensive chronic kidney disease with stage 1 through stage 4 chronic kidney disease, or unspecified chronic kidney disease: Secondary | ICD-10-CM | POA: Diagnosis not present

## 2024-01-30 DIAGNOSIS — E1122 Type 2 diabetes mellitus with diabetic chronic kidney disease: Secondary | ICD-10-CM | POA: Diagnosis not present

## 2024-01-30 DIAGNOSIS — E1151 Type 2 diabetes mellitus with diabetic peripheral angiopathy without gangrene: Secondary | ICD-10-CM | POA: Diagnosis not present

## 2024-01-30 DIAGNOSIS — I480 Paroxysmal atrial fibrillation: Secondary | ICD-10-CM | POA: Diagnosis not present

## 2024-02-02 DIAGNOSIS — E11319 Type 2 diabetes mellitus with unspecified diabetic retinopathy without macular edema: Secondary | ICD-10-CM | POA: Diagnosis not present

## 2024-02-02 DIAGNOSIS — M25512 Pain in left shoulder: Secondary | ICD-10-CM | POA: Diagnosis not present

## 2024-02-02 DIAGNOSIS — E1122 Type 2 diabetes mellitus with diabetic chronic kidney disease: Secondary | ICD-10-CM | POA: Diagnosis not present

## 2024-02-02 DIAGNOSIS — I2510 Atherosclerotic heart disease of native coronary artery without angina pectoris: Secondary | ICD-10-CM | POA: Diagnosis not present

## 2024-02-02 DIAGNOSIS — I129 Hypertensive chronic kidney disease with stage 1 through stage 4 chronic kidney disease, or unspecified chronic kidney disease: Secondary | ICD-10-CM | POA: Diagnosis not present

## 2024-02-02 DIAGNOSIS — I480 Paroxysmal atrial fibrillation: Secondary | ICD-10-CM | POA: Diagnosis not present

## 2024-02-02 DIAGNOSIS — E1142 Type 2 diabetes mellitus with diabetic polyneuropathy: Secondary | ICD-10-CM | POA: Diagnosis not present

## 2024-02-02 DIAGNOSIS — N1831 Chronic kidney disease, stage 3a: Secondary | ICD-10-CM | POA: Diagnosis not present

## 2024-02-02 DIAGNOSIS — S51801D Unspecified open wound of right forearm, subsequent encounter: Secondary | ICD-10-CM | POA: Diagnosis not present

## 2024-02-02 DIAGNOSIS — E782 Mixed hyperlipidemia: Secondary | ICD-10-CM | POA: Diagnosis not present

## 2024-02-02 DIAGNOSIS — E1151 Type 2 diabetes mellitus with diabetic peripheral angiopathy without gangrene: Secondary | ICD-10-CM | POA: Diagnosis not present

## 2024-02-02 DIAGNOSIS — I10 Essential (primary) hypertension: Secondary | ICD-10-CM | POA: Diagnosis not present

## 2024-02-03 DIAGNOSIS — N1831 Chronic kidney disease, stage 3a: Secondary | ICD-10-CM | POA: Diagnosis not present

## 2024-02-03 DIAGNOSIS — I480 Paroxysmal atrial fibrillation: Secondary | ICD-10-CM | POA: Diagnosis not present

## 2024-02-03 DIAGNOSIS — E11319 Type 2 diabetes mellitus with unspecified diabetic retinopathy without macular edema: Secondary | ICD-10-CM | POA: Diagnosis not present

## 2024-02-03 DIAGNOSIS — E1122 Type 2 diabetes mellitus with diabetic chronic kidney disease: Secondary | ICD-10-CM | POA: Diagnosis not present

## 2024-02-03 DIAGNOSIS — I129 Hypertensive chronic kidney disease with stage 1 through stage 4 chronic kidney disease, or unspecified chronic kidney disease: Secondary | ICD-10-CM | POA: Diagnosis not present

## 2024-02-03 DIAGNOSIS — E1142 Type 2 diabetes mellitus with diabetic polyneuropathy: Secondary | ICD-10-CM | POA: Diagnosis not present

## 2024-02-03 DIAGNOSIS — E1151 Type 2 diabetes mellitus with diabetic peripheral angiopathy without gangrene: Secondary | ICD-10-CM | POA: Diagnosis not present

## 2024-02-03 DIAGNOSIS — S51801D Unspecified open wound of right forearm, subsequent encounter: Secondary | ICD-10-CM | POA: Diagnosis not present

## 2024-02-03 DIAGNOSIS — I2510 Atherosclerotic heart disease of native coronary artery without angina pectoris: Secondary | ICD-10-CM | POA: Diagnosis not present

## 2024-02-04 DIAGNOSIS — E1151 Type 2 diabetes mellitus with diabetic peripheral angiopathy without gangrene: Secondary | ICD-10-CM | POA: Diagnosis not present

## 2024-02-04 DIAGNOSIS — S51801D Unspecified open wound of right forearm, subsequent encounter: Secondary | ICD-10-CM | POA: Diagnosis not present

## 2024-02-04 DIAGNOSIS — N1831 Chronic kidney disease, stage 3a: Secondary | ICD-10-CM | POA: Diagnosis not present

## 2024-02-04 DIAGNOSIS — I129 Hypertensive chronic kidney disease with stage 1 through stage 4 chronic kidney disease, or unspecified chronic kidney disease: Secondary | ICD-10-CM | POA: Diagnosis not present

## 2024-02-04 DIAGNOSIS — E11319 Type 2 diabetes mellitus with unspecified diabetic retinopathy without macular edema: Secondary | ICD-10-CM | POA: Diagnosis not present

## 2024-02-04 DIAGNOSIS — E1122 Type 2 diabetes mellitus with diabetic chronic kidney disease: Secondary | ICD-10-CM | POA: Diagnosis not present

## 2024-02-04 DIAGNOSIS — E1142 Type 2 diabetes mellitus with diabetic polyneuropathy: Secondary | ICD-10-CM | POA: Diagnosis not present

## 2024-02-04 DIAGNOSIS — I480 Paroxysmal atrial fibrillation: Secondary | ICD-10-CM | POA: Diagnosis not present

## 2024-02-04 DIAGNOSIS — I2510 Atherosclerotic heart disease of native coronary artery without angina pectoris: Secondary | ICD-10-CM | POA: Diagnosis not present

## 2024-02-06 DIAGNOSIS — I480 Paroxysmal atrial fibrillation: Secondary | ICD-10-CM | POA: Diagnosis not present

## 2024-02-06 DIAGNOSIS — E1151 Type 2 diabetes mellitus with diabetic peripheral angiopathy without gangrene: Secondary | ICD-10-CM | POA: Diagnosis not present

## 2024-02-06 DIAGNOSIS — E11319 Type 2 diabetes mellitus with unspecified diabetic retinopathy without macular edema: Secondary | ICD-10-CM | POA: Diagnosis not present

## 2024-02-06 DIAGNOSIS — I2510 Atherosclerotic heart disease of native coronary artery without angina pectoris: Secondary | ICD-10-CM | POA: Diagnosis not present

## 2024-02-06 DIAGNOSIS — N1831 Chronic kidney disease, stage 3a: Secondary | ICD-10-CM | POA: Diagnosis not present

## 2024-02-06 DIAGNOSIS — S51801D Unspecified open wound of right forearm, subsequent encounter: Secondary | ICD-10-CM | POA: Diagnosis not present

## 2024-02-06 DIAGNOSIS — I129 Hypertensive chronic kidney disease with stage 1 through stage 4 chronic kidney disease, or unspecified chronic kidney disease: Secondary | ICD-10-CM | POA: Diagnosis not present

## 2024-02-06 DIAGNOSIS — E1122 Type 2 diabetes mellitus with diabetic chronic kidney disease: Secondary | ICD-10-CM | POA: Diagnosis not present

## 2024-02-06 DIAGNOSIS — E1142 Type 2 diabetes mellitus with diabetic polyneuropathy: Secondary | ICD-10-CM | POA: Diagnosis not present

## 2024-02-09 DIAGNOSIS — I480 Paroxysmal atrial fibrillation: Secondary | ICD-10-CM | POA: Diagnosis not present

## 2024-02-09 DIAGNOSIS — I129 Hypertensive chronic kidney disease with stage 1 through stage 4 chronic kidney disease, or unspecified chronic kidney disease: Secondary | ICD-10-CM | POA: Diagnosis not present

## 2024-02-09 DIAGNOSIS — I2510 Atherosclerotic heart disease of native coronary artery without angina pectoris: Secondary | ICD-10-CM | POA: Diagnosis not present

## 2024-02-09 DIAGNOSIS — E1151 Type 2 diabetes mellitus with diabetic peripheral angiopathy without gangrene: Secondary | ICD-10-CM | POA: Diagnosis not present

## 2024-02-09 DIAGNOSIS — E1142 Type 2 diabetes mellitus with diabetic polyneuropathy: Secondary | ICD-10-CM | POA: Diagnosis not present

## 2024-02-09 DIAGNOSIS — E1122 Type 2 diabetes mellitus with diabetic chronic kidney disease: Secondary | ICD-10-CM | POA: Diagnosis not present

## 2024-02-09 DIAGNOSIS — E11319 Type 2 diabetes mellitus with unspecified diabetic retinopathy without macular edema: Secondary | ICD-10-CM | POA: Diagnosis not present

## 2024-02-09 DIAGNOSIS — N1831 Chronic kidney disease, stage 3a: Secondary | ICD-10-CM | POA: Diagnosis not present

## 2024-02-09 DIAGNOSIS — S51801D Unspecified open wound of right forearm, subsequent encounter: Secondary | ICD-10-CM | POA: Diagnosis not present

## 2024-02-10 ENCOUNTER — Encounter (HOSPITAL_BASED_OUTPATIENT_CLINIC_OR_DEPARTMENT_OTHER): Attending: Internal Medicine | Admitting: Internal Medicine

## 2024-02-10 DIAGNOSIS — E11622 Type 2 diabetes mellitus with other skin ulcer: Secondary | ICD-10-CM | POA: Diagnosis not present

## 2024-02-10 DIAGNOSIS — L98499 Non-pressure chronic ulcer of skin of other sites with unspecified severity: Secondary | ICD-10-CM | POA: Diagnosis not present

## 2024-02-10 DIAGNOSIS — W1800XA Striking against unspecified object with subsequent fall, initial encounter: Secondary | ICD-10-CM | POA: Insufficient documentation

## 2024-02-10 DIAGNOSIS — S41101A Unspecified open wound of right upper arm, initial encounter: Secondary | ICD-10-CM | POA: Diagnosis not present

## 2024-02-10 DIAGNOSIS — T798XXA Other early complications of trauma, initial encounter: Secondary | ICD-10-CM | POA: Insufficient documentation

## 2024-02-11 DIAGNOSIS — I480 Paroxysmal atrial fibrillation: Secondary | ICD-10-CM | POA: Diagnosis not present

## 2024-02-11 DIAGNOSIS — N1831 Chronic kidney disease, stage 3a: Secondary | ICD-10-CM | POA: Diagnosis not present

## 2024-02-11 DIAGNOSIS — I2510 Atherosclerotic heart disease of native coronary artery without angina pectoris: Secondary | ICD-10-CM | POA: Diagnosis not present

## 2024-02-11 DIAGNOSIS — S51801D Unspecified open wound of right forearm, subsequent encounter: Secondary | ICD-10-CM | POA: Diagnosis not present

## 2024-02-11 DIAGNOSIS — I129 Hypertensive chronic kidney disease with stage 1 through stage 4 chronic kidney disease, or unspecified chronic kidney disease: Secondary | ICD-10-CM | POA: Diagnosis not present

## 2024-02-11 DIAGNOSIS — E11319 Type 2 diabetes mellitus with unspecified diabetic retinopathy without macular edema: Secondary | ICD-10-CM | POA: Diagnosis not present

## 2024-02-11 DIAGNOSIS — E1151 Type 2 diabetes mellitus with diabetic peripheral angiopathy without gangrene: Secondary | ICD-10-CM | POA: Diagnosis not present

## 2024-02-11 DIAGNOSIS — E1122 Type 2 diabetes mellitus with diabetic chronic kidney disease: Secondary | ICD-10-CM | POA: Diagnosis not present

## 2024-02-11 DIAGNOSIS — E1142 Type 2 diabetes mellitus with diabetic polyneuropathy: Secondary | ICD-10-CM | POA: Diagnosis not present

## 2024-02-12 ENCOUNTER — Encounter: Payer: Self-pay | Admitting: Radiology

## 2024-02-13 DIAGNOSIS — K219 Gastro-esophageal reflux disease without esophagitis: Secondary | ICD-10-CM | POA: Diagnosis not present

## 2024-02-13 DIAGNOSIS — R32 Unspecified urinary incontinence: Secondary | ICD-10-CM | POA: Diagnosis not present

## 2024-02-13 DIAGNOSIS — I77819 Aortic ectasia, unspecified site: Secondary | ICD-10-CM | POA: Diagnosis not present

## 2024-02-13 DIAGNOSIS — I4892 Unspecified atrial flutter: Secondary | ICD-10-CM | POA: Diagnosis not present

## 2024-02-13 DIAGNOSIS — I272 Pulmonary hypertension, unspecified: Secondary | ICD-10-CM | POA: Diagnosis not present

## 2024-02-13 DIAGNOSIS — Z79621 Long term (current) use of calcineurin inhibitor: Secondary | ICD-10-CM | POA: Diagnosis not present

## 2024-02-13 DIAGNOSIS — M3500 Sicca syndrome, unspecified: Secondary | ICD-10-CM | POA: Diagnosis not present

## 2024-02-13 DIAGNOSIS — E1122 Type 2 diabetes mellitus with diabetic chronic kidney disease: Secondary | ICD-10-CM | POA: Diagnosis not present

## 2024-02-13 DIAGNOSIS — M1990 Unspecified osteoarthritis, unspecified site: Secondary | ICD-10-CM | POA: Diagnosis not present

## 2024-02-13 DIAGNOSIS — J439 Emphysema, unspecified: Secondary | ICD-10-CM | POA: Diagnosis not present

## 2024-02-13 DIAGNOSIS — D6949 Other primary thrombocytopenia: Secondary | ICD-10-CM | POA: Diagnosis not present

## 2024-02-13 DIAGNOSIS — N400 Enlarged prostate without lower urinary tract symptoms: Secondary | ICD-10-CM | POA: Diagnosis not present

## 2024-02-13 DIAGNOSIS — I130 Hypertensive heart and chronic kidney disease with heart failure and stage 1 through stage 4 chronic kidney disease, or unspecified chronic kidney disease: Secondary | ICD-10-CM | POA: Diagnosis not present

## 2024-02-13 DIAGNOSIS — L98491 Non-pressure chronic ulcer of skin of other sites limited to breakdown of skin: Secondary | ICD-10-CM | POA: Diagnosis not present

## 2024-02-13 DIAGNOSIS — G319 Degenerative disease of nervous system, unspecified: Secondary | ICD-10-CM | POA: Diagnosis not present

## 2024-02-13 DIAGNOSIS — E11311 Type 2 diabetes mellitus with unspecified diabetic retinopathy with macular edema: Secondary | ICD-10-CM | POA: Diagnosis not present

## 2024-02-13 DIAGNOSIS — I4891 Unspecified atrial fibrillation: Secondary | ICD-10-CM | POA: Diagnosis not present

## 2024-02-13 DIAGNOSIS — I7025 Atherosclerosis of native arteries of other extremities with ulceration: Secondary | ICD-10-CM | POA: Diagnosis not present

## 2024-02-13 DIAGNOSIS — N1831 Chronic kidney disease, stage 3a: Secondary | ICD-10-CM | POA: Diagnosis not present

## 2024-02-13 DIAGNOSIS — I509 Heart failure, unspecified: Secondary | ICD-10-CM | POA: Diagnosis not present

## 2024-02-23 DIAGNOSIS — G4700 Insomnia, unspecified: Secondary | ICD-10-CM | POA: Diagnosis not present

## 2024-02-23 DIAGNOSIS — E1165 Type 2 diabetes mellitus with hyperglycemia: Secondary | ICD-10-CM | POA: Diagnosis not present

## 2024-02-23 DIAGNOSIS — R531 Weakness: Secondary | ICD-10-CM | POA: Diagnosis not present

## 2024-02-24 ENCOUNTER — Encounter (HOSPITAL_BASED_OUTPATIENT_CLINIC_OR_DEPARTMENT_OTHER): Admitting: Internal Medicine

## 2024-02-25 ENCOUNTER — Other Ambulatory Visit: Payer: Self-pay

## 2024-02-25 ENCOUNTER — Inpatient Hospital Stay (HOSPITAL_COMMUNITY)
Admission: EM | Admit: 2024-02-25 | Discharge: 2024-03-04 | DRG: 189 | Disposition: A | Discharge status: Skilled Nursing Facility | Attending: Internal Medicine | Admitting: Internal Medicine

## 2024-02-25 ENCOUNTER — Inpatient Hospital Stay (HOSPITAL_COMMUNITY)

## 2024-02-25 ENCOUNTER — Encounter (HOSPITAL_COMMUNITY): Payer: Self-pay | Admitting: Family Medicine

## 2024-02-25 ENCOUNTER — Emergency Department (HOSPITAL_COMMUNITY)

## 2024-02-25 DIAGNOSIS — E785 Hyperlipidemia, unspecified: Secondary | ICD-10-CM | POA: Diagnosis not present

## 2024-02-25 DIAGNOSIS — R296 Repeated falls: Secondary | ICD-10-CM | POA: Diagnosis present

## 2024-02-25 DIAGNOSIS — R5383 Other fatigue: Secondary | ICD-10-CM | POA: Diagnosis not present

## 2024-02-25 DIAGNOSIS — I351 Nonrheumatic aortic (valve) insufficiency: Secondary | ICD-10-CM | POA: Diagnosis not present

## 2024-02-25 DIAGNOSIS — M6281 Muscle weakness (generalized): Secondary | ICD-10-CM | POA: Diagnosis not present

## 2024-02-25 DIAGNOSIS — E1140 Type 2 diabetes mellitus with diabetic neuropathy, unspecified: Secondary | ICD-10-CM | POA: Diagnosis not present

## 2024-02-25 DIAGNOSIS — J189 Pneumonia, unspecified organism: Secondary | ICD-10-CM | POA: Diagnosis not present

## 2024-02-25 DIAGNOSIS — I4891 Unspecified atrial fibrillation: Secondary | ICD-10-CM | POA: Diagnosis not present

## 2024-02-25 DIAGNOSIS — Z85820 Personal history of malignant melanoma of skin: Secondary | ICD-10-CM | POA: Diagnosis not present

## 2024-02-25 DIAGNOSIS — E1142 Type 2 diabetes mellitus with diabetic polyneuropathy: Secondary | ICD-10-CM | POA: Diagnosis not present

## 2024-02-25 DIAGNOSIS — N179 Acute kidney failure, unspecified: Secondary | ICD-10-CM | POA: Diagnosis present

## 2024-02-25 DIAGNOSIS — Z87891 Personal history of nicotine dependence: Secondary | ICD-10-CM | POA: Diagnosis not present

## 2024-02-25 DIAGNOSIS — C4441 Basal cell carcinoma of skin of scalp and neck: Secondary | ICD-10-CM | POA: Diagnosis present

## 2024-02-25 DIAGNOSIS — I2510 Atherosclerotic heart disease of native coronary artery without angina pectoris: Secondary | ICD-10-CM | POA: Diagnosis present

## 2024-02-25 DIAGNOSIS — R1312 Dysphagia, oropharyngeal phase: Secondary | ICD-10-CM | POA: Diagnosis not present

## 2024-02-25 DIAGNOSIS — R918 Other nonspecific abnormal finding of lung field: Secondary | ICD-10-CM | POA: Diagnosis not present

## 2024-02-25 DIAGNOSIS — I480 Paroxysmal atrial fibrillation: Secondary | ICD-10-CM | POA: Diagnosis not present

## 2024-02-25 DIAGNOSIS — E1165 Type 2 diabetes mellitus with hyperglycemia: Secondary | ICD-10-CM

## 2024-02-25 DIAGNOSIS — J811 Chronic pulmonary edema: Secondary | ICD-10-CM | POA: Diagnosis not present

## 2024-02-25 DIAGNOSIS — C434 Malignant melanoma of scalp and neck: Secondary | ICD-10-CM | POA: Diagnosis present

## 2024-02-25 DIAGNOSIS — I10 Essential (primary) hypertension: Secondary | ICD-10-CM | POA: Diagnosis not present

## 2024-02-25 DIAGNOSIS — R739 Hyperglycemia, unspecified: Secondary | ICD-10-CM | POA: Diagnosis not present

## 2024-02-25 DIAGNOSIS — I959 Hypotension, unspecified: Secondary | ICD-10-CM | POA: Diagnosis present

## 2024-02-25 DIAGNOSIS — Z7984 Long term (current) use of oral hypoglycemic drugs: Secondary | ICD-10-CM | POA: Diagnosis not present

## 2024-02-25 DIAGNOSIS — J90 Pleural effusion, not elsewhere classified: Principal | ICD-10-CM | POA: Diagnosis present

## 2024-02-25 DIAGNOSIS — R0602 Shortness of breath: Secondary | ICD-10-CM | POA: Diagnosis not present

## 2024-02-25 DIAGNOSIS — Z7982 Long term (current) use of aspirin: Secondary | ICD-10-CM | POA: Diagnosis not present

## 2024-02-25 DIAGNOSIS — Z48813 Encounter for surgical aftercare following surgery on the respiratory system: Secondary | ICD-10-CM | POA: Diagnosis not present

## 2024-02-25 DIAGNOSIS — J9601 Acute respiratory failure with hypoxia: Secondary | ICD-10-CM | POA: Diagnosis present

## 2024-02-25 DIAGNOSIS — R590 Localized enlarged lymph nodes: Secondary | ICD-10-CM | POA: Diagnosis present

## 2024-02-25 DIAGNOSIS — R0902 Hypoxemia: Principal | ICD-10-CM

## 2024-02-25 DIAGNOSIS — Z96659 Presence of unspecified artificial knee joint: Secondary | ICD-10-CM | POA: Diagnosis present

## 2024-02-25 DIAGNOSIS — R0989 Other specified symptoms and signs involving the circulatory and respiratory systems: Secondary | ICD-10-CM | POA: Diagnosis not present

## 2024-02-25 DIAGNOSIS — Z8249 Family history of ischemic heart disease and other diseases of the circulatory system: Secondary | ICD-10-CM | POA: Diagnosis not present

## 2024-02-25 DIAGNOSIS — I517 Cardiomegaly: Secondary | ICD-10-CM | POA: Diagnosis not present

## 2024-02-25 DIAGNOSIS — M549 Dorsalgia, unspecified: Secondary | ICD-10-CM | POA: Diagnosis not present

## 2024-02-25 DIAGNOSIS — Z7401 Bed confinement status: Secondary | ICD-10-CM | POA: Diagnosis not present

## 2024-02-25 DIAGNOSIS — R531 Weakness: Secondary | ICD-10-CM | POA: Diagnosis present

## 2024-02-25 DIAGNOSIS — G8929 Other chronic pain: Secondary | ICD-10-CM | POA: Diagnosis not present

## 2024-02-25 DIAGNOSIS — H9190 Unspecified hearing loss, unspecified ear: Secondary | ICD-10-CM | POA: Diagnosis present

## 2024-02-25 DIAGNOSIS — Z85828 Personal history of other malignant neoplasm of skin: Secondary | ICD-10-CM

## 2024-02-25 DIAGNOSIS — Z66 Do not resuscitate: Secondary | ICD-10-CM | POA: Diagnosis not present

## 2024-02-25 DIAGNOSIS — R41841 Cognitive communication deficit: Secondary | ICD-10-CM | POA: Diagnosis not present

## 2024-02-25 DIAGNOSIS — R053 Chronic cough: Secondary | ICD-10-CM | POA: Diagnosis not present

## 2024-02-25 DIAGNOSIS — Z794 Long term (current) use of insulin: Secondary | ICD-10-CM

## 2024-02-25 DIAGNOSIS — Z79899 Other long term (current) drug therapy: Secondary | ICD-10-CM | POA: Diagnosis not present

## 2024-02-25 DIAGNOSIS — J948 Other specified pleural conditions: Secondary | ICD-10-CM | POA: Diagnosis not present

## 2024-02-25 DIAGNOSIS — J9811 Atelectasis: Secondary | ICD-10-CM | POA: Diagnosis not present

## 2024-02-25 LAB — GLUCOSE, CAPILLARY
Glucose-Capillary: 226 mg/dL — ABNORMAL HIGH (ref 70–99)
Glucose-Capillary: 175 mg/dL — ABNORMAL HIGH (ref 70–99)

## 2024-02-25 LAB — BODY FLUID CELL COUNT WITH DIFFERENTIAL
Eos, Fluid: 26 %
Lymphs, Fluid: 25 %
Monocyte-Macrophage-Serous Fluid: 46 % — ABNORMAL LOW (ref 50–90)
Neutrophil Count, Fluid: 3 % (ref 0–25)
Total Nucleated Cell Count, Fluid: 166 uL (ref 0–1000)

## 2024-02-25 LAB — CBC
HCT: 30.2 % — ABNORMAL LOW (ref 39.0–52.0)
Hemoglobin: 9.2 g/dL — ABNORMAL LOW (ref 13.0–17.0)
MCH: 27.1 pg (ref 26.0–34.0)
MCHC: 30.5 g/dL (ref 30.0–36.0)
MCV: 88.8 fL (ref 80.0–100.0)
Platelets: 244 10*3/uL (ref 150–400)
RBC: 3.4 MIL/uL — ABNORMAL LOW (ref 4.22–5.81)
RDW: 18.5 % — ABNORMAL HIGH (ref 11.5–15.5)
WBC: 7.8 10*3/uL (ref 4.0–10.5)
nRBC: 0.3 % — ABNORMAL HIGH (ref 0.0–0.2)

## 2024-02-25 LAB — BASIC METABOLIC PANEL WITH GFR
Anion gap: 9 (ref 5–15)
BUN: 33 mg/dL — ABNORMAL HIGH (ref 8–23)
CO2: 23 mmol/L (ref 22–32)
Calcium: 8.7 mg/dL — ABNORMAL LOW (ref 8.9–10.3)
Chloride: 108 mmol/L (ref 98–111)
Creatinine, Ser: 1.17 mg/dL (ref 0.61–1.24)
GFR, Estimated: 58 mL/min — ABNORMAL LOW (ref >60)
Glucose, Bld: 345 mg/dL — ABNORMAL HIGH (ref 70–99)
Potassium: 4.1 mmol/L (ref 3.5–5.1)
Sodium: 140 mmol/L (ref 135–145)

## 2024-02-25 LAB — GLUCOSE, PLEURAL OR PERITONEAL FLUID: Glucose, Fluid: 275 mg/dL

## 2024-02-25 LAB — HEMOGLOBIN A1C
Hgb A1c MFr Bld: 10.5 % — ABNORMAL HIGH (ref 4.8–5.6)
Mean Plasma Glucose: 254.65 mg/dL

## 2024-02-25 LAB — PROTEIN, PLEURAL OR PERITONEAL FLUID: Total protein, fluid: 3.6 g/dL

## 2024-02-25 LAB — TROPONIN I (HIGH SENSITIVITY)
Troponin I (High Sensitivity): 17 ng/L (ref <18)
Troponin I (High Sensitivity): 17 ng/L (ref <18)

## 2024-02-25 LAB — BRAIN NATRIURETIC PEPTIDE: B Natriuretic Peptide: 388.5 pg/mL — ABNORMAL HIGH (ref 0.0–100.0)

## 2024-02-25 LAB — GRAM STAIN

## 2024-02-25 MED ORDER — ONDANSETRON HCL 4 MG PO TABS
4.0000 mg | ORAL_TABLET | Freq: Four times a day (QID) | ORAL | Status: DC | PRN
  Administered 2024-02-26: 4 mg via ORAL
  Filled 2024-02-25: qty 1

## 2024-02-25 MED ORDER — SODIUM CHLORIDE 0.9 % IV SOLN
250.0000 mL | INTRAVENOUS | Status: DC | PRN
  Administered 2024-02-25: 250 mL via INTRAVENOUS

## 2024-02-25 MED ORDER — ISOSORBIDE MONONITRATE ER 30 MG PO TB24
30.0000 mg | ORAL_TABLET | Freq: Every day | ORAL | Status: DC
  Administered 2024-02-26 – 2024-03-04 (×7): 30 mg via ORAL
  Filled 2024-02-25 (×8): qty 1

## 2024-02-25 MED ORDER — GABAPENTIN 400 MG PO CAPS
800.0000 mg | ORAL_CAPSULE | Freq: Three times a day (TID) | ORAL | Status: DC
  Administered 2024-02-25: 800 mg via ORAL
  Filled 2024-02-25: qty 2

## 2024-02-25 MED ORDER — ASPIRIN 81 MG PO TBEC: 81.0000 mg | DELAYED_RELEASE_TABLET | Freq: Every day | ORAL | Status: DC

## 2024-02-25 MED ORDER — INSULIN ASPART 100 UNIT/ML IJ SOLN
0.0000 [IU] | Freq: Three times a day (TID) | INTRAMUSCULAR | Status: DC
  Administered 2024-02-26: 3 [IU] via SUBCUTANEOUS
  Administered 2024-02-26: 1 [IU] via SUBCUTANEOUS
  Administered 2024-02-26 – 2024-02-27 (×2): 3 [IU] via SUBCUTANEOUS
  Administered 2024-02-27: 2 [IU] via SUBCUTANEOUS
  Administered 2024-02-27: 3 [IU] via SUBCUTANEOUS
  Administered 2024-02-28 (×2): 5 [IU] via SUBCUTANEOUS
  Administered 2024-02-28: 2 [IU] via SUBCUTANEOUS
  Administered 2024-02-29: 3 [IU] via SUBCUTANEOUS
  Administered 2024-02-29: 9 [IU] via SUBCUTANEOUS
  Administered 2024-02-29: 2 [IU] via SUBCUTANEOUS
  Administered 2024-03-01 (×2): 3 [IU] via SUBCUTANEOUS
  Administered 2024-03-01 – 2024-03-02 (×3): 5 [IU] via SUBCUTANEOUS
  Administered 2024-03-02 – 2024-03-03 (×2): 3 [IU] via SUBCUTANEOUS
  Administered 2024-03-03: 7 [IU] via SUBCUTANEOUS
  Administered 2024-03-03: 5 [IU] via SUBCUTANEOUS
  Administered 2024-03-04: 7 [IU] via SUBCUTANEOUS
  Filled 2024-02-25: qty 0.09

## 2024-02-25 MED ORDER — ACETAMINOPHEN 325 MG PO TABS
650.0000 mg | ORAL_TABLET | Freq: Four times a day (QID) | ORAL | Status: DC | PRN
  Administered 2024-02-26 – 2024-02-29 (×3): 650 mg via ORAL
  Filled 2024-02-25 (×3): qty 2

## 2024-02-25 MED ORDER — ACETAMINOPHEN 650 MG RE SUPP: 650.0000 mg | Freq: Four times a day (QID) | RECTAL | Status: DC | PRN

## 2024-02-25 MED ORDER — FUROSEMIDE 10 MG/ML IJ SOLN
40.0000 mg | Freq: Every day | INTRAMUSCULAR | Status: DC
  Administered 2024-02-25 – 2024-02-29 (×5): 40 mg via INTRAVENOUS
  Filled 2024-02-25 (×5): qty 4

## 2024-02-25 MED ORDER — DILTIAZEM HCL ER 180 MG PO TB24
180.0000 mg | ORAL_TABLET | Freq: Every day | ORAL | Status: DC
  Filled 2024-02-25: qty 1

## 2024-02-25 MED ORDER — SENNA 8.6 MG PO TABS
1.0000 | ORAL_TABLET | Freq: Two times a day (BID) | ORAL | Status: DC
  Administered 2024-02-25 – 2024-03-04 (×16): 8.6 mg via ORAL
  Filled 2024-02-25 (×16): qty 1

## 2024-02-25 MED ORDER — TRAZODONE HCL 100 MG PO TABS
100.0000 mg | ORAL_TABLET | Freq: Every day | ORAL | Status: DC
  Administered 2024-02-25 – 2024-03-03 (×8): 100 mg via ORAL
  Filled 2024-02-25 (×8): qty 1

## 2024-02-25 MED ORDER — SODIUM CHLORIDE 0.9% FLUSH: 3.0000 mL | INTRAVENOUS | Status: DC | PRN

## 2024-02-25 MED ORDER — SODIUM CHLORIDE 0.9% FLUSH
3.0000 mL | Freq: Two times a day (BID) | INTRAVENOUS | Status: DC
  Administered 2024-02-25 – 2024-02-26 (×3): 3 mL via INTRAVENOUS

## 2024-02-25 MED ORDER — LIDOCAINE HCL 1 % IJ SOLN
INTRAMUSCULAR | Status: AC
  Filled 2024-02-25: qty 20

## 2024-02-25 MED ORDER — ONDANSETRON HCL 4 MG/2ML IJ SOLN
4.0000 mg | Freq: Four times a day (QID) | INTRAMUSCULAR | Status: DC | PRN
  Administered 2024-02-29: 4 mg via INTRAVENOUS
  Filled 2024-02-25: qty 2

## 2024-02-25 MED ORDER — ATORVASTATIN CALCIUM 40 MG PO TABS
80.0000 mg | ORAL_TABLET | Freq: Every evening | ORAL | Status: DC
  Administered 2024-02-25 – 2024-03-03 (×8): 80 mg via ORAL
  Filled 2024-02-25 (×8): qty 2

## 2024-02-25 MED ORDER — DOCUSATE SODIUM 100 MG PO CAPS
100.0000 mg | ORAL_CAPSULE | Freq: Two times a day (BID) | ORAL | Status: DC
  Administered 2024-02-25 – 2024-03-04 (×15): 100 mg via ORAL
  Filled 2024-02-25 (×15): qty 1

## 2024-02-25 NOTE — H&P (Signed)
 History and Physical    [name] NFA:[id number] DOB: [birth year] DOA: 02/25/2024  PCP: Gwyndolyn Lerner, PA-C   Patient coming from: Home  I have personally briefly reviewed patient's old medical records in [facility].  Chief Complaint: Shortness of breath,  HPI: [name] is a 89 y.o. male with PMH significant for hypertension, hyperlipidemia, type 2 diabetes, hard of hearing, history of atrial flutter, not on anticoagulation due to recurrent falls, aortic insufficiency, presented in the ED with progressive shortness of breath for last few days.  Patient uses walker at home for ambulation. Patient was at her baseline few days back then he started having exertional shortness of breath today,  he was hypoxic, SPO2 was 75% on room air. Patient denies any chest pain. He was brought in the ED.  He was hypoxic requiring 4 L of supplemental oxygen.  ED Course: He was hemodynamically stable except tachypnea and hypotension and hypoxia. Temp 97.7, HR 86, RR 22, BP 157/74, SpO2 99% on 4 L. Labs include sodium 140, potassium 4.1, chloride 108, bicarb 23, glucose 347, BUN 33, creatinine 1.17, calcium  8.7, anion gap 9, BNP 388.5, troponin 17, WBC 7.8, hemoglobin 9.2, hematocrit 30.2, MCV 88.8, platelet 244, Chest x-[place] : Stable cardiomegaly. Interval development of large left pleural effusion with probable underlying atelectasis or infiltrate    Review of Systems:  Review of Systems  Constitutional: Negative.   HENT: Negative.    Eyes: Negative.   Respiratory:  Positive for shortness of breath.   Cardiovascular:  Positive for orthopnea and leg swelling.  Gastrointestinal: Negative.   Genitourinary: Negative.   Musculoskeletal: Negative.   Skin: Negative.   Neurological: Negative.   Endo/Heme/Allergies: Negative.   Psychiatric/Behavioral: Negative.      Past Medical History:  Diagnosis Date   Anemia    related to frequent nosebleeds-right nostril.   Aortic insufficiency    a.  mild by echo 2013.   Arthritis    arthritis -back knees, most joints   Atrial flutter (HCC)    a. per report in 2013.   Breathing difficulty 11-10-12   difficult to breath if laying posteriorly, right nare difficulty   Carotid artery disease (HCC)    a. Remote R CEA in the 1990s per pt.   Change in hearing    Diabetes mellitus age 53   Diabetic peripheral neuropathy associated with type 2 diabetes mellitus (HCC) 07/08/2022   Dysrhythmia 07/2012   hx. Atrial Flutter x1, converted spontaneously-no ploblems.    H/O hiatal hernia    HOH (hard of hearing) 11-10-12   bilaterally   Hyperlipidemia    Hypertension    Ulcer 1995   bleeding ulcer, none since    Past Surgical History:  Procedure Laterality Date   CAROTID DUPLEX  06/30/2012   PATENT RIGHT CAROTID ENDARTERECTOMY. 1%-39% LEFT ICA. STABLE   CAROTID ENDARTERECTOMY  11/242008   right with Dacron patch angioplasty   CATARACT EXTRACTION  1998 bilateral  done 3 months apart   CHOLECYSTECTOMY N/A 06/11/2019   Procedure: LAPAROSCOPIC CHOLECYSTECTOMY;  Surgeon: Sim Dryer, MD;  Location: [facility] OR;  Service: General;  Laterality: N/A;   ESOPHAGOGASTRODUODENOSCOPY (EGD) WITH PROPOFOL  N/A 06/08/2019   Procedure: ESOPHAGOGASTRODUODENOSCOPY (EGD) WITH PROPOFOL ;  Surgeon: Baldo Bonds, MD;  Location: [facility] ENDOSCOPY;  Service: Endoscopy;  Laterality: N/A;   ESOPHAGOGASTRODUODENOSCOPY (EGD) WITH PROPOFOL  N/A 05/22/2023   Procedure: ESOPHAGOGASTRODUODENOSCOPY (EGD) WITH PROPOFOL ;  Surgeon: Genell Ken, MD;  Location: [facility] ENDOSCOPY;  Service: Gastroenterology;  Laterality: N/A;   EYE  SURGERY     HEMOSTASIS CLIP PLACEMENT  05/22/2023   Procedure: HEMOSTASIS CLIP PLACEMENT;  Surgeon: Genell Ken, MD;  Location: [facility] ENDOSCOPY;  Service: Gastroenterology;;   JOINT REPLACEMENT Right 11-18-12   KNEE BURSECTOMY  11/18/2012   Procedure: KNEE BURSECTOMY;  Surgeon: Florencia Hunter, MD;  Location: [facility] ORS;  Service: Orthopedics;  Laterality: Right;   NM MYOCAR  MULTIPLE W/SPECT  10/08/2012   NORMAL STRESS NUCLEAR STUDY.   POLYPECTOMY  05/22/2023   Procedure: POLYPECTOMY;  Surgeon: Genell Ken, MD;  Location: [facility] ENDOSCOPY;  Service: Gastroenterology;;   TOTAL KNEE ARTHROPLASTY  11/18/2012   Procedure: TOTAL KNEE ARTHROPLASTY;  Surgeon: Florencia Hunter, MD;  Location: [facility] ORS;  Service: Orthopedics;  Laterality: Right;   TRANSTHORACIC ECHOCARDIOGRAM  10/08/2012   MODERATE LVH. MODERATE CONCENTRIC HYPERTROPHY.EF 65 TO 70%. GRADE 1 DYSTOLIC DYSFUNCTION. ELEVATED VENTRICULAR END-DIASTOLIC FILLING PRESSURE AND LEFT ATRIAL FILLING PRESSURE. AV- MILD TO MODERATE CALCIFIED ANNULUS. MV- CALCIFIC DEGENERATION.Aaron Aas LA-MILDLY DILATED.     reports that he quit smoking about 47 years ago. His smoking use included cigarettes. He has been exposed to tobacco smoke. He has never used smokeless tobacco. He reports current alcohol use of about 1.0 standard drink of alcohol per week. He reports that he does not use drugs.  No Known Allergies  Family History  Problem Relation Age of Onset   Cancer Mother        Pancreatic   Heart disease Mother    Cancer Brother        pancreactic   Cancer Daughter        Breast   Family history reviewed and not pertinent.  Prior to Admission medications   Medication Sig Start Date End Date Taking? Authorizing Provider  acetaminophen  (TYLENOL ) 500 MG tablet Take 500 mg by mouth every 6 (six) hours as needed for moderate pain (for pain).    [provider]  aspirin  EC 81 MG tablet Take 1 tablet (81 mg total) by mouth daily. 07/13/19   Avanell Leigh, MD  atorvastatin  (LIPITOR ) 80 MG tablet Take 80 mg by mouth every evening.     [provider]  cyanocobalamin  1000 MCG tablet Take 1 tablet (1,000 mcg total) by mouth daily. 02/23/23   Shalhoub, Merrill Abide, MD  cycloSPORINE  (RESTASIS ) 0.05 % ophthalmic emulsion Place 1 drop into both eyes 2 (two) times daily.    [provider]  Dextrose , Diabetic Use, (GLUCOSE PO)  Take 1 tablet by mouth daily as needed (Low blood sugar).    [provider]  diltiazem  (CARDIZEM  LA) 180 MG 24 hr tablet Take 180 mg by mouth daily. 04/29/22   [provider]  empagliflozin  (JARDIANCE ) 25 MG TABS tablet Take 25 mg by mouth daily. 05/02/22   [provider]  furosemide  (LASIX ) 20 MG tablet Take 1 tablet (20 mg total) by mouth as needed (Weigh yourself daily.  If weight increases by more than 2 pounds from day before or 5 pounds compared to prior week take one dose.). 02/22/23 02/22/24  Shalhoub, Merrill Abide, MD  gabapentin  (NEURONTIN ) 800 MG tablet Take 800 mg by mouth 3 (three) times daily. 08/01/21   [provider]  isosorbide  mononitrate (IMDUR ) 30 MG 24 hr tablet Take 1 tablet (30 mg total) by mouth daily. 05/02/23   Avanell Leigh, MD  LANTUS  SOLOSTAR 100 UNIT/ML Solostar Pen Inject 5-9 Units into the skin at bedtime. Sliding scale 03/24/23   [provider]  lidocaine  (LIDODERM ) 5 % Place  1 patch onto the skin daily as needed (for pain- on 12 hours/off 12 hours). 08/07/21   [provider]  losartan  (COZAAR ) 25 MG tablet Take 1 tablet (25 mg total) by mouth daily before breakfast. 01/22/22 05/20/23  Dahal, Aminta Baldy, MD  Omega-3 1000 MG CAPS Take 2,000 mg by mouth daily.    [provider]  pantoprazole  (PROTONIX ) 40 MG tablet Take 1 tablet (40 mg total) by mouth daily. 05/23/23 08/21/23  Ghimire, Kuber, MD  pioglitazone (ACTOS) 45 MG tablet Take 45 mg by mouth daily. 05/06/23   [provider]  traZODone  (DESYREL ) 100 MG tablet Take 100 mg by mouth at bedtime.    [provider]    Physical Exam: Vitals:   02/25/24 1143 02/25/24 1147  BP: (!) 157/74   Pulse: 86   Resp: (!) 22   Temp:  97.7 F (36.5 C)  TempSrc:  Oral  SpO2: 99%     Constitutional: Appears calm, comfortable, deconditioned, not in any acute distress. Vitals:   02/25/24 1143 02/25/24 1147  BP: (!) 157/74   Pulse: 86   Resp: (!) 22    Temp:  97.7 F (36.5 C)  TempSrc:  Oral  SpO2: 99%    Eyes: PERRL, lids and conjunctivae normal ENMT: Mucous membranes are moist. Posterior pharynx clear of any exudate or lesions. Neck: normal, supple, no masses, no thyromegaly Respiratory: Decreased breath sounds on the left side, clear on the right. no wheezing, no crackles. Normal respiratory effort. No accessory muscle use.  Cardiovascular: S1+ S2 heard, Regular rate and rhythm, no murmurs / rubs / gallops. No extremity edema. 2+ pedal pulses. No carotid bruits.  Abdomen: Soft, no tenderness, no masses palpated. No hepatosplenomegaly. Bowel sounds positive.  Musculoskeletal: no clubbing / cyanosis. No joint deformity upper and lower extremities. Good ROM, no contractures. Normal muscle tone.  Skin: no rashes, lesions, ulcers. No induration Neurologic: CN 2-12 grossly intact. Sensation intact, DTR normal. Strength 5/5 in all 4.  Psychiatric: Normal judgment and insight. Alert and oriented x 3. Normal mood.    Labs on Admission: I have personally reviewed following labs and imaging studies  CBC: Recent Labs  Lab 02/25/24 1206  WBC 7.8  HGB 9.2*  HCT 30.2*  MCV 88.8  PLT 244   Basic Metabolic Panel: Recent Labs  Lab 02/25/24 1206  NA 140  K 4.1  CL 108  CO2 23  GLUCOSE 345*  BUN 33*  CREATININE 1.17  CALCIUM  8.7*   GFR: CrCl cannot be calculated (Unknown ideal weight.). Liver Function Tests: No results for input(s): "AST", "ALT", "ALKPHOS", "BILITOT", "PROT", "ALBUMIN" in the last 168 hours. No results for input(s): "LIPASE", "AMYLASE" in the last 168 hours. No results for input(s): "AMMONIA" in the last 168 hours. Coagulation Profile: No results for input(s): "INR", "PROTIME" in the last 168 hours. Cardiac Enzymes: No results for input(s): "CKTOTAL", "CKMB", "CKMBINDEX", "TROPONINI" in the last 168 hours. BNP (last 3 results) No results for input(s): "PROBNP" in the last 8760 hours. HbA1C: No results for  input(s): "HGBA1C" in the last 72 hours. CBG: No results for input(s): "GLUCAP" in the last 168 hours. Lipid Profile: No results for input(s): "CHOL", "HDL", "LDLCALC", "TRIG", "CHOLHDL", "LDLDIRECT" in the last 72 hours. Thyroid Function Tests: No results for input(s): "TSH", "T4TOTAL", "FREET4", "T3FREE", "THYROIDAB" in the last 72 hours. Anemia Panel: No results for input(s): "VITAMINB12", "FOLATE", "FERRITIN", "TIBC", "IRON", "RETICCTPCT" in the last 72 hours. Urine analysis:    Component Value Date/Time  COLORURINE YELLOW 02/20/2023 1229   APPEARANCEUR CLEAR 02/20/2023 1229   LABSPEC 1.016 02/20/2023 1229   PHURINE 5.0 02/20/2023 1229   GLUCOSEU >=500 (A) 02/20/2023 1229   HGBUR SMALL (A) 02/20/2023 1229   BILIRUBINUR NEGATIVE 02/20/2023 1229   KETONESUR 20 (A) 02/20/2023 1229   PROTEINUR 100 (A) 02/20/2023 1229   UROBILINOGEN 0.2 11/10/2012 1432   NITRITE NEGATIVE 02/20/2023 1229   LEUKOCYTESUR MODERATE (A) 02/20/2023 1229    Radiological Exams on Admission: DG Chest 2 View Result Date: 02/25/2024 CLINICAL DATA:  Shortness of breath for several days. EXAM: CHEST - 2 VIEW COMPARISON:  Feb 20, 2023. FINDINGS: Stable cardiomegaly. Interval development of large left pleural effusion with probable underlying atelectasis or infiltrate. Minimal right basilar subsegmental atelectasis is noted. Bony thorax is unremarkable. IMPRESSION: Interval development of large left pleural effusion as noted above. Electronically Signed   By: Rosalene Colon M.D.   On: 02/25/2024 13:20    EKG: Independently reviewed.  Atrial fibrillation with slow ventricular response, right bundle branch block.  Assessment/Plan Principal Problem:   Acute hypoxic respiratory failure (HCC) Active Problems:   AF (paroxysmal atrial fibrillation) (HCC)   Coronary artery disease involving native coronary artery of native heart   Uncontrolled type 2 diabetes mellitus with hyperglycemia, with long-term current use of  insulin  (HCC)   Essential hypertension   Hyperlipidemia   Basal cell carcinoma of scalp and skin of neck   Malignant melanoma of head and neck (HCC)  Acute hypoxic respiratory failure : Large left pleural effusion: Patient presented with progressive shortness of breath, hypoxia requiring supplemental oxygen,  Now on 4 L of supplemental oxygen.   Chest x-[place] showed large left pleural effusion.  BNP found to be 388.5 Obtain CT chest for further clarification. IR consulted for ultrasound guided thoracocentesis. Continue supplemental oxygen and wean as tolerated. There is no signs of infection or infiltrate on chest x-[place].  Hold on antibiotics. Continue Lasix  40 mg IV daily.  Monitor intake / output charting.  CAD: Patient denies chest pain, Palpitations Continue Imdur , Cardizem , aspirin , Lipitor .  Atrial fibrillation: Heart rate controlled, continue Cardizem . Patient is not on anticoagulation due to old age / recurrent falls.  Diabetes mellitus: Hold on p.o. diabetic medications. Continue regular insulin  sliding scale, Carb modified diet.  Essential hypertension: Continue Cardizem , hold on losartan .  Hyperlipidemia: Continue Lipitor  40 mg daily.  History of malignant melanoma of head and neck: Patient does have history of malignant melanoma in the past. This effusion could be related to this.  Obtain CT chest.  Hard of Hearing: Continue supportive care.    DVT prophylaxis: SCDs Code Status: DNR Family Communication: No family at bed side Disposition Plan:    Status is: Inpatient Remains inpatient appropriate because: Admitted for acute hypoxic respiratory failure secondary to left large pleural effusion requiring thoracocentesis.   Consults called: IR Admission status: Inpatient   Magdalene School MD [facility]   If 7PM-7AM, please contact night-coverage   02/25/2024, 2:55 PM

## 2024-02-25 NOTE — ED Triage Notes (Signed)
 Pt presents to the ED via EMS from home with complaints of worsening shortness of breath for a couple of days. Initially on room air 75% on 4L [place] 98%.   EMS Vitals:  BP 139/60 HR 73 CBG 432

## 2024-02-25 NOTE — ED Provider Notes (Signed)
 [place] EMERGENCY DEPARTMENT AT [facility] Provider Note   CSN: [id number] Arrival date & time: 02/25/24  1128     History  Chief Complaint  Patient presents with   Shortness of Breath    [name] is a 89 y.o. male with past medical history significant of hypertension, hyperlipidemia, diabetes, who is hard of hearing, history of a flutter, aortic insufficiency not currently on chronic anticoagulation who presents to concern for shortness of breath for the last few days.  On room air was 75%.  He reports shortness of breath worse with exertion, denies acute chest pain.  On 4 L nasal cannula oxygen saturation improved to 98%.   Shortness of Breath      Home Medications Prior to Admission medications   Medication Sig Start Date End Date Taking? Authorizing Provider  acetaminophen  (TYLENOL ) 500 MG tablet Take 500 mg by mouth every 6 (six) hours as needed for moderate pain (for pain).    [provider]  aspirin  EC 81 MG tablet Take 1 tablet (81 mg total) by mouth daily. 07/13/19   Avanell Leigh, MD  atorvastatin  (LIPITOR ) 80 MG tablet Take 80 mg by mouth every evening.     [provider]  cyanocobalamin  1000 MCG tablet Take 1 tablet (1,000 mcg total) by mouth daily. 02/23/23   Shalhoub, Merrill Abide, MD  cycloSPORINE  (RESTASIS ) 0.05 % ophthalmic emulsion Place 1 drop into both eyes 2 (two) times daily.    [provider]  Dextrose , Diabetic Use, (GLUCOSE PO) Take 1 tablet by mouth daily as needed (Low blood sugar).    [provider]  diltiazem  (CARDIZEM  LA) 180 MG 24 hr tablet Take 180 mg by mouth daily. 04/29/22   [provider]  empagliflozin  (JARDIANCE ) 25 MG TABS tablet Take 25 mg by mouth daily. 05/02/22   [provider]  furosemide  (LASIX ) 20 MG tablet Take 1 tablet (20 mg total) by mouth as needed (Weigh yourself daily.  If weight increases by more than 2 pounds from day before or 5 pounds compared to prior week  take one dose.). 02/22/23 02/22/24  Shalhoub, Merrill Abide, MD  gabapentin  (NEURONTIN ) 800 MG tablet Take 800 mg by mouth 3 (three) times daily. 08/01/21   [provider]  isosorbide  mononitrate (IMDUR ) 30 MG 24 hr tablet Take 1 tablet (30 mg total) by mouth daily. 05/02/23   Avanell Leigh, MD  LANTUS  SOLOSTAR 100 UNIT/ML Solostar Pen Inject 5-9 Units into the skin at bedtime. Sliding scale 03/24/23   [provider]  lidocaine  (LIDODERM ) 5 % Place 1 patch onto the skin daily as needed (for pain- on 12 hours/off 12 hours). 08/07/21   [provider]  losartan  (COZAAR ) 25 MG tablet Take 1 tablet (25 mg total) by mouth daily before breakfast. 01/22/22 05/20/23  Dahal, Aminta Baldy, MD  Omega-3 1000 MG CAPS Take 2,000 mg by mouth daily.    [provider]  pantoprazole  (PROTONIX ) 40 MG tablet Take 1 tablet (40 mg total) by mouth daily. 05/23/23 08/21/23  Ghimire, Kuber, MD  pioglitazone (ACTOS) 45 MG tablet Take 45 mg by mouth daily. 05/06/23   [provider]  traZODone  (DESYREL ) 100 MG tablet Take 100 mg by mouth at bedtime.    [provider]      Allergies    Patient has no known allergies.    Review of Systems   Review of Systems  Respiratory:  Positive for shortness of breath.   All other  systems reviewed and are negative.   Physical Exam Updated Vital Signs BP (!) 157/74   Pulse 86   Temp 97.7 F (36.5 C) (Oral)   Resp (!) 22   SpO2 99%  Physical Exam Vitals and nursing note reviewed.  Constitutional:      General: He is not in acute distress.    Appearance: Normal appearance.  HENT:     Head: Normocephalic and atraumatic.  Eyes:     General:        Right eye: No discharge.        Left eye: No discharge.  Cardiovascular:     Rate and Rhythm: Normal rate and regular rhythm.     Heart sounds: No murmur heard.    No friction rub. No gallop.  Pulmonary:     Effort: Pulmonary effort is normal.     Breath sounds: Normal breath sounds.      Comments: No significant wheezing, rhonchi, stridor, rales, but increased work of breathing noted.  Question some crackles at lung bases.  Questionable dull sounds in left lung field Abdominal:     General: Bowel sounds are normal.     Palpations: Abdomen is soft.  Skin:    General: Skin is warm and dry.     Capillary Refill: Capillary refill takes less than 2 seconds.  Neurological:     Mental Status: He is alert and oriented to person, place, and time.  Psychiatric:        Mood and Affect: Mood normal.        Behavior: Behavior normal.     ED Results / Procedures / Treatments   Labs (all labs ordered are listed, but only abnormal results are displayed) Labs Reviewed  BASIC METABOLIC PANEL WITH GFR - Abnormal; Notable for the following components:      Result Value   Glucose, Bld 345 (*)    BUN 33 (*)    Calcium  8.7 (*)    GFR, Estimated 58 (*)    All other components within normal limits  CBC - Abnormal; Notable for the following components:   RBC 3.40 (*)    Hemoglobin 9.2 (*)    HCT 30.2 (*)    RDW 18.5 (*)    nRBC 0.3 (*)    All other components within normal limits  BRAIN NATRIURETIC PEPTIDE - Abnormal; Notable for the following components:   B Natriuretic Peptide 388.5 (*)    All other components within normal limits  TROPONIN I (HIGH SENSITIVITY)  TROPONIN I (HIGH SENSITIVITY)    EKG None  Radiology DG Chest 2 View Result Date: 02/25/2024 CLINICAL DATA:  Shortness of breath for several days. EXAM: CHEST - 2 VIEW COMPARISON:  Feb 20, 2023. FINDINGS: Stable cardiomegaly. Interval development of large left pleural effusion with probable underlying atelectasis or infiltrate. Minimal right basilar subsegmental atelectasis is noted. Bony thorax is unremarkable. IMPRESSION: Interval development of large left pleural effusion as noted above. Electronically Signed   By: Rosalene Colon M.D.   On: 02/25/2024 13:20    Procedures .Critical Care  Performed by:  Nelly Banco, PA-C Authorized by: Nelly Banco, PA-C   Critical care provider statement:    Critical care time (minutes):  35   Critical care was necessary to treat or prevent imminent or life-threatening deterioration of the following conditions:  Respiratory failure   Critical care was time spent personally by me on the following activities:  Development of treatment plan with patient or surrogate,  discussions with consultants, evaluation of patient's response to treatment, examination of patient, ordering and review of laboratory studies, ordering and review of radiographic studies, ordering and performing treatments and interventions, pulse oximetry, re-evaluation of patient's condition and review of old charts   Care discussed with: admitting provider       Medications Ordered in ED Medications - No data to display  ED Course/ Medical Decision Making/ A&P                                 Medical Decision Making  This patient is a 89 y.o. male  who presents to the ED for concern of shob.   Differential diagnoses prior to evaluation: The emergent differential diagnosis includes, but is not limited to,  asthma exacerbation, COPD exacerbation, acute upper respiratory infection, acute bronchitis, chronic bronchitis, interstitial lung disease, ARDS, PE, pneumonia, atypical ACS, carbon monoxide poisoning, spontaneous pneumothorax, new CHF vs CHF exacerbation, versus other . This is not an exhaustive differential.   Past Medical History / Co-morbidities / Social History: hypertension, hyperlipidemia, diabetes, who is hard of hearing, history of a flutter, aortic insufficiency  Additional history: Chart reviewed. Pertinent results include: Reviewed lab work, imaging from previous emergency department visits, notably on his CT from a fall late last year he was starting to develop a pleural effusion on the same side that he has 1 today.  No follow-up for his pleural  effusion.  Physical Exam: Physical exam performed. The pertinent findings include: Some tachypnea mild increased work of breathing, he is somewhat hypertensive, blood pressure 157/74.  No wheezing, rhonchi, stridor, rales, question crackles at lung bases especially on the left, question focal consolidation.  He has some peripheral edema noted on exam.  Lab Tests/Imaging studies: I personally interpreted labs/imaging and the pertinent results include: BMP notable for elevated glucose at 345, BUN 33, stable creatinine, no anion gap, normal sodium, potassium.  His initial troponin is less than 18 in context of no active chest pain, BNP mildly elevated at 388.5.  His CBC is notable for anemia, hemoglobin 9.2 but fairly stable compared to baseline.  I independently to read plain film chest x-[place]acin which shows large left-sided pleural effusion, no evidence of focal mass or focal consolidation.. I agree with the radiologist interpretation.  Consults: Spoke with the hospitalist, who agrees to admission for acute hypoxic respiratory failure in the context of large new left sided pleural effusion.   Disposition: After consideration of the diagnostic results and the patients response to treatment, I feel that patient would benefit from admission for acute hypoxic respiratory failure in the context of large left-sided pleural effusion.   Final Clinical Impression(s) / ED Diagnoses Final diagnoses:  Hypoxia    Rx / DC Orders ED Discharge Orders     None         Stefan Edge 02/25/24 1405    Guadalupe Lee, MD 02/26/24 2241

## 2024-02-25 NOTE — Procedures (Signed)
 Ultrasound-guided diagnostic and therapeutic left thoracentesis performed yielding 1.6 liters of yellow fluid. No immediate complications. Follow-up chest x-Miguel pending. The fluid was sent to the lab for preordered studies. EBL none. Due to pt coughing and this being pt's initial thoracentesis only the above amount of fluid was removed today.

## 2024-02-25 NOTE — Plan of Care (Signed)

## 2024-02-26 ENCOUNTER — Encounter (HOSPITAL_COMMUNITY): Payer: Self-pay | Admitting: Family Medicine

## 2024-02-26 DIAGNOSIS — J9601 Acute respiratory failure with hypoxia: Secondary | ICD-10-CM | POA: Diagnosis not present

## 2024-02-26 LAB — COMPREHENSIVE METABOLIC PANEL WITH GFR
ALT: 13 U/L (ref 0–44)
AST: 14 U/L — ABNORMAL LOW (ref 15–41)
Albumin: 3 g/dL — ABNORMAL LOW (ref 3.5–5.0)
Alkaline Phosphatase: 100 U/L (ref 38–126)
Anion gap: 9 (ref 5–15)
BUN: 31 mg/dL — ABNORMAL HIGH (ref 8–23)
CO2: 29 mmol/L (ref 22–32)
Calcium: 9.3 mg/dL (ref 8.9–10.3)
Chloride: 105 mmol/L (ref 98–111)
Creatinine, Ser: 1.41 mg/dL — ABNORMAL HIGH (ref 0.61–1.24)
GFR, Estimated: 47 mL/min — ABNORMAL LOW (ref >60)
Glucose, Bld: 144 mg/dL — ABNORMAL HIGH (ref 70–99)
Potassium: 3.8 mmol/L (ref 3.5–5.1)
Sodium: 143 mmol/L (ref 135–145)
Total Bilirubin: 1.2 mg/dL (ref 0.0–1.2)
Total Protein: 6.4 g/dL — ABNORMAL LOW (ref 6.5–8.1)

## 2024-02-26 LAB — CBC
HCT: 35.3 % — ABNORMAL LOW (ref 39.0–52.0)
Hemoglobin: 10.3 g/dL — ABNORMAL LOW (ref 13.0–17.0)
MCH: 26.9 pg (ref 26.0–34.0)
MCHC: 29.2 g/dL — ABNORMAL LOW (ref 30.0–36.0)
MCV: 92.2 fL (ref 80.0–100.0)
Platelets: 259 10*3/uL (ref 150–400)
RBC: 3.83 MIL/uL — ABNORMAL LOW (ref 4.22–5.81)
RDW: 18.7 % — ABNORMAL HIGH (ref 11.5–15.5)
WBC: 9.8 10*3/uL (ref 4.0–10.5)
nRBC: 0 % (ref 0.0–0.2)

## 2024-02-26 LAB — GLUCOSE, CAPILLARY
Glucose-Capillary: 137 mg/dL — ABNORMAL HIGH (ref 70–99)
Glucose-Capillary: 236 mg/dL — ABNORMAL HIGH (ref 70–99)
Glucose-Capillary: 242 mg/dL — ABNORMAL HIGH (ref 70–99)
Glucose-Capillary: 219 mg/dL — ABNORMAL HIGH (ref 70–99)

## 2024-02-26 LAB — MAGNESIUM: Magnesium: 2.2 mg/dL (ref 1.7–2.4)

## 2024-02-26 LAB — PHOSPHORUS: Phosphorus: 4.4 mg/dL (ref 2.5–4.6)

## 2024-02-26 MED ORDER — GABAPENTIN 300 MG PO CAPS
600.0000 mg | ORAL_CAPSULE | Freq: Three times a day (TID) | ORAL | Status: DC
  Administered 2024-02-26 – 2024-02-27 (×6): 600 mg via ORAL
  Filled 2024-02-26 (×7): qty 2

## 2024-02-26 MED ORDER — DILTIAZEM HCL ER COATED BEADS 180 MG PO CP24
180.0000 mg | ORAL_CAPSULE | Freq: Every day | ORAL | Status: DC
  Administered 2024-02-26 – 2024-03-04 (×6): 180 mg via ORAL
  Filled 2024-02-26 (×8): qty 1

## 2024-02-26 NOTE — Progress Notes (Signed)
   02/26/24 1035  TOC Brief Assessment  Insurance and Status Reviewed  Patient has primary care physician Yes  Home environment has been reviewed single family home  Prior level of function: independent  Prior/Current Home Services No current home services  Social Drivers of Health Review SDOH reviewed no interventions necessary  Readmission risk has been reviewed Yes  Transition of care needs transition of care needs identified, TOC will continue to follow    [place] Pr[place], MSW, LCSW 02/26/2024 10:36 AM

## 2024-02-26 NOTE — Plan of Care (Signed)

## 2024-02-26 NOTE — Progress Notes (Addendum)
 PROGRESS NOTE    [name]  NGE:[id number] DOB: [birth year] DOA: 02/25/2024 PCP: Gwyndolyn Lerner, PA-C   Brief Narrative: This 89 y.o. male with PMH significant for hypertension, hyperlipidemia, type 2 diabetes, hard of hearing, history of atrial flutter, not on anticoagulation due to recurrent falls, aortic insufficiency, presented in the ED with progressive shortness of breath for last few days. He was hypoxic, SPO2 was 75% on room air in ED, requiring up to 4 L of supplemental oxygen.  X-Akim and CT chest shows large left pleural effusion.  IR consulted,  Patient underwent thoracocentesis.  1.4 L of clear fluid drained. Patient feels much improved,  He still remains on 4 L of oxygen. Patient denies any chest pain.  Patient was admitted for further evaluation.   Assessment & Plan:   Principal Problem:   Acute hypoxic respiratory failure (HCC) Active Problems:   AF (paroxysmal atrial fibrillation) (HCC)   Coronary artery disease involving native coronary artery of native heart   Uncontrolled type 2 diabetes mellitus with hyperglycemia, with long-term current use of insulin  (HCC)   Essential hypertension   Hyperlipidemia   Basal cell carcinoma of scalp and skin of neck   Malignant melanoma of head and neck (HCC)  Acute hypoxic respiratory failure : Large left pleural effusion: Patient presented with progressive shortness of breath, hypoxia requiring supplemental oxygen,  Now on 4 L of supplemental oxygen.   Chest x-Ethin showed large left pleural effusion.  BNP found to be 388.5 CT chest shows large left pleural effusion. IR consulted,  Patient underwent ultrasound-guided thoracocentesis 1.4 L of clear fluid drained. Continue supplemental oxygen and wean as tolerated. There is no signs of infection or infiltrate on chest x-Kito.  Hold on antibiotics. Continue Lasix  40 mg IV daily.  Monitor intake / output charting. Patient reports feeling much better.   CAD: Patient denies chest pain,  Palpitations Continue Imdur , Cardizem , aspirin , Lipitor .   Atrial fibrillation: Heart rate controlled, continue Cardizem . Patient is not on anticoagulation due to old age / recurrent falls.   Diabetes mellitus: Hold on p.o. diabetic medications. Continue regular insulin  sliding scale, Carb modified diet.   Essential hypertension: Continue Cardizem , hold on losartan .   Hyperlipidemia: Continue Lipitor  40 mg daily.   History of malignant melanoma of head and neck: Patient does have history of malignant melanoma in the past. This effusion could be related to this.  Obtain CT chest.   Hard of Hearing: Continue supportive care.  Acute kidney injury: Likely due to decreased intake and Lasix . Monitor serum creatinine.   DVT prophylaxis: SCDs Code Status: DNR Family Communication: Spoke with daughter [name] on phone. Disposition Plan:    Status is: Inpatient Remains inpatient appropriate because: Admitted for acute hypoxic respiratory failure secondary to left large pleural effusion requiring thoracocentesis.     Consultants:  None  Procedures: Thoracocentesis Antimicrobials: None  Subjective: Patient was seen and examined at bedside.  Overnight events noted. Patient reports feeling much improved after thoracocentesis. He was sitting comfortably but still requires 4 L of supplemental oxygen.  Objective: Vitals:   02/25/24 2334 02/26/24 0514 02/26/24 0521 02/26/24 1142  BP: (!) 124/53 (!) 126/54  (!) 120/57  Pulse: (!) 50 77  80  Resp: 18 18    Temp: 98 F (36.7 C) (!) 97 F (36.1 C)  98 F (36.7 C)  TempSrc:    Oral  SpO2: 100% 96%  94%  Weight:   79.5 kg   Height:   6' (1.829 m)  Intake/Output Summary (Last 24 hours) at 02/26/2024 1154 Last data filed at 02/26/2024 1002 Gross per 24 hour  Intake 355.12 ml  Output 2175 ml  Net -1819.88 ml   Filed Weights   02/26/24 0521  Weight: 79.5 kg    Examination:  General exam: Appears calm and comfortable,  not in any acute distress.  Deconditioned Respiratory system: CTA bilaterally. Respiratory effort normal. Cardiovascular system: S1 & S2 heard, RRR. No JVD, murmurs, rubs, gallops or clicks. Gastrointestinal system: Abdomen is non distended, soft and non tender.  Normal bowel sounds heard. Central nervous system: Alert and oriented X 3. No focal neurological deficits. Extremities: No edema, no cyanosis, no clubbing. Skin: No rashes, lesions or ulcers Psychiatry: Judgement and insight appear normal. Mood & affect appropriate.     Data Reviewed: I have personally reviewed following labs and imaging studies  CBC: Recent Labs  Lab 02/25/24 1206 02/26/24 0545  WBC 7.8 9.8  HGB 9.2* 10.3*  HCT 30.2* 35.3*  MCV 88.8 92.2  PLT 244 259   Basic Metabolic Panel: Recent Labs  Lab 02/25/24 1206 02/26/24 0715  NA 140 143  K 4.1 3.8  CL 108 105  CO2 23 29  GLUCOSE 345* 144*  BUN 33* 31*  CREATININE 1.17 1.41*  CALCIUM  8.7* 9.3  MG  --  2.2  PHOS  --  4.4   GFR: Estimated Creatinine Clearance: 36.7 mL/min (A) (by C-G formula based on SCr of 1.41 mg/dL (H)). Liver Function Tests: Recent Labs  Lab 02/26/24 0715  AST 14*  ALT 13  ALKPHOS 100  BILITOT 1.2  PROT 6.4*  ALBUMIN 3.0*   No results for input(s): "LIPASE", "AMYLASE" in the last 168 hours. No results for input(s): "AMMONIA" in the last 168 hours. Coagulation Profile: No results for input(s): "INR", "PROTIME" in the last 168 hours. Cardiac Enzymes: No results for input(s): "CKTOTAL", "CKMB", "CKMBINDEX", "TROPONINI" in the last 168 hours. BNP (last 3 results) No results for input(s): "PROBNP" in the last 8760 hours. HbA1C: Recent Labs    02/25/24 1616  HGBA1C 10.5*   CBG: Recent Labs  Lab 02/25/24 1712 02/25/24 2127 02/26/24 0725 02/26/24 1140  GLUCAP 226* 175* 137* 236*   Lipid Profile: No results for input(s): "CHOL", "HDL", "LDLCALC", "TRIG", "CHOLHDL", "LDLDIRECT" in the last 72 hours. Thyroid  Function Tests: No results for input(s): "TSH", "T4TOTAL", "FREET4", "T3FREE", "THYROIDAB" in the last 72 hours. Anemia Panel: No results for input(s): "VITAMINB12", "FOLATE", "FERRITIN", "TIBC", "IRON", "RETICCTPCT" in the last 72 hours. Sepsis Labs: No results for input(s): "PROCALCITON", "LATICACIDVEN" in the last 168 hours.  Recent Results (from the past 240 hours)  Culture, body fluid w Gram Stain-bottle     Status: None (Preliminary result)   Collection Time: 02/25/24  3:54 PM   Specimen: Fluid  Result Value Ref Range Status   Specimen Description FLUID PLEURAL LEFT  Final   Special Requests BOTTLES DRAWN AEROBIC AND ANAEROBIC  Final   Culture   Final    NO GROWTH < 24 HOURS Performed at [facility], [address], [place], [place] [address]    Report Status PENDING  Incomplete  Gram stain     Status: None   Collection Time: 02/25/24  3:54 PM   Specimen: Fluid  Result Value Ref Range Status   Specimen Description FLUID PLEURAL LEFT  Final   Special Requests NONE  Final   Gram Stain   Final    WBC PRESENT, PREDOMINANTLY MONONUCLEAR NO ORGANISMS SEEN  CYTOSPIN SMEAR Performed at [facility], [address], [place], [place] [address]    Report Status 02/25/2024 FINAL  Final    Radiology Studies: DG Chest 1 View Result Date: 02/25/2024 CLINICAL DATA:  Status post thoracentesis. EXAM: CHEST  1 VIEW COMPARISON:  Same-day radiograph at 12:19 p.m. FINDINGS: Stable cardiomediastinal silhouette. Aortic atherosclerosis. Decreased size of a large left pleural effusion with improved aeration in the left upper lung. No definite pneumothorax. Left lung opacities with persistent left lower lobe volume loss/collapse. Similar small right pleural effusion with right basilar opacities. IMPRESSION: 1. Decreased size of a large left pleural effusion with improved aeration in the left upper lung. Persistent left lung opacities and lower lobe atelectasis/collapse. No definite  pneumothorax. 2. Small right pleural effusion with right basilar opacities. Electronically Signed   By: Mannie Seek M.D.   On: 02/25/2024 17:15   US  THORACENTESIS ASP PLEURAL SPACE W/IMG GUIDE Result Date: 02/25/2024 INDICATION: Patient with history of coronary artery disease, atrial fibrillation, malignant melanoma of head and neck, dyspnea, left pleural effusion; request received for diagnostic and therapeutic left thoracentesis. EXAM: ULTRASOUND GUIDED DIAGNOSTIC AND THERAPEUTIC LEFT THORACENTESIS MEDICATIONS: 8 mL 1% lidocaine  COMPLICATIONS: None immediate. PROCEDURE: An ultrasound guided thoracentesis was thoroughly discussed with the patient and questions answered. The benefits, risks, alternatives and complications were also discussed. The patient understands and wishes to proceed with the procedure. Written consent was obtained. Ultrasound was performed to localize and mark an adequate pocket of fluid in the left chest. The area was then prepped and draped in the normal sterile fashion. 1% Lidocaine  was used for local anesthesia. Under ultrasound guidance a 6 Fr Safe-T-Centesis catheter was introduced. Thoracentesis was performed. The catheter was removed and a dressing applied. FINDINGS: A total of approximately 1.6 liters of yellow fluid was removed. Samples were sent to the laboratory as requested by the clinical team. Due to patient coughing and this being patient's initial thoracentesis only the above amount of fluid was removed today. IMPRESSION: Successful ultrasound guided diagnostic and therapeutic left thoracentesis yielding 1.6 liters of pleural fluid. Performed by: Wash Hack Electronically Signed   By: Melven Stable.  Shick M.D.   On: 02/25/2024 16:21   CT CHEST WO CONTRAST Result Date: 02/25/2024 CLINICAL DATA:  Pneumonia, pleural effusion EXAM: CT CHEST WITHOUT CONTRAST TECHNIQUE: Multidetector CT imaging of the chest was performed following the standard protocol without IV contrast.  RADIATION DOSE REDUCTION: This exam was performed according to the departmental dose-optimization program which includes automated exposure control, adjustment of the mA and/or kV according to patient size and/or use of iterative reconstruction technique. COMPARISON:  August 13, 2023 FINDINGS: Cardiovascular: Choose 1. Extensive calcification of the ascending aorta and coronary arteries without aneurysms. No significant pericardial effusion. Mediastinum/Nodes: No enlarged mediastinal or axillary lymph nodes. Thyroid gland, trachea, and esophagus demonstrate no significant findings. Lungs/Pleura: Large left pleural effusion with significant left lung collapse or consolidation or combination of both with the infiltrates of the left upper lobe that could correlate with pneumonia. Small right pleural effusion with right lower lobe infiltrates and atelectasis. Upper Abdomen: No acute abnormality. Musculoskeletal: No chest wall mass or suspicious bone lesions identified. IMPRESSION: *Large left pleural effusion with significant left lung collapse or consolidation or combination of both with the infiltrates of the left upper lobe that could correlate with pneumonia. *Small right pleural effusion with right lower lobe infiltrates and atelectasis. Electronically Signed   By: Fredrich Jefferson M.D.   On: 02/25/2024 15:53   DG  Chest 2 View Result Date: 02/25/2024 CLINICAL DATA:  Shortness of breath for several days. EXAM: CHEST - 2 VIEW COMPARISON:  Feb 20, 2023. FINDINGS: Stable cardiomegaly. Interval development of large left pleural effusion with probable underlying atelectasis or infiltrate. Minimal right basilar subsegmental atelectasis is noted. Bony thorax is unremarkable. IMPRESSION: Interval development of large left pleural effusion as noted above. Electronically Signed   By: Rosalene Colon M.D.   On: 02/25/2024 13:20   Scheduled Meds:  atorvastatin   80 mg Oral QPM   diltiazem   180 mg Oral Daily   docusate sodium    100 mg Oral BID   furosemide   40 mg Intravenous Daily   gabapentin   600 mg Oral TID   insulin  aspart  0-9 Units Subcutaneous TID WC   isosorbide  mononitrate  30 mg Oral Daily   senna  1 tablet Oral BID   sodium chloride  flush  3 mL Intravenous Q12H   traZODone   100 mg Oral QHS   Continuous Infusions:  sodium chloride  10 mL/hr at 02/26/24 0404     LOS: 1 day    Time spent: 35 Mins    Magdalene School, MD [facility]   If 7PM-7AM, please contact night-coverage

## 2024-02-26 NOTE — Evaluation (Signed)
 Occupational Therapy Evaluation Patient Details Name: [name] MRN: [id number] DOB: 01/19/1930 Today's Date: 02/26/2024   History of Present Illness   89 y.o. male with PMH significant for hypertension, hyperlipidemia, type 2 diabetes, hard of hearing, history of atrial flutter, not on anticoagulation due to recurrent falls, aortic insufficiency, presented 5/7 with progressive shortness of breath for last few days.  He was hypoxic, requiring up to 4 L.     Clinical Impressions Patient admitted for the diagnosis above.  PTA patient states he lives at home with his spouse, who relies on PCA assist herself.  Patient claims to complete his own ADL, and walks the home with a RW, but also endorses falls at home.  Patient's deficits include generalized weakness, poor activity tolerance and is unsteady even at RW level.  Patient would benefit from SNF level rehab prior to returning home, but will likely decline.  Patient would probably benefit from PCA assist himself and increased ADL and iADL support.  Patient did just complete HH PT, and is open to doing this again, unclear if this would be enough.  OT will continue to follow in the acute setting to address deficits.      If plan is discharge home, recommend the following:   Assist for transportation;A lot of help with bathing/dressing/bathroom;A little help with walking and/or transfers;Assistance with cooking/housework     Functional Status Assessment   Patient has had a recent decline in their functional status and demonstrates the ability to make significant improvements in function in a reasonable and predictable amount of time.     Equipment Recommendations   BSC/3in1     Recommendations for Other Services         Precautions/Restrictions   Precautions Precautions: Fall;Other (comment) Precaution/Restrictions Comments: Watch O2 Restrictions Weight Bearing Restrictions Per Provider Order: No     Mobility Bed  Mobility Overal bed mobility: Needs Assistance Bed Mobility: Supine to Sit     Supine to sit: Mod assist       Patient Response: Cooperative  Transfers Overall transfer level: Needs assistance   Transfers: Sit to/from Stand, Bed to chair/wheelchair/BSC Sit to Stand: Min assist, Mod assist     Step pivot transfers: Min assist     General transfer comment: LOB times two with complaints offatigue and lower extremity weakness      Balance Overall balance assessment: Needs assistance Sitting-balance support: Feet supported Sitting balance-Leahy Scale: Fair   Postural control: Posterior lean Standing balance support: Reliant on assistive device for balance Standing balance-Leahy Scale: Poor                             ADL either performed or assessed with clinical judgement   ADL Overall ADL's : Needs assistance/impaired Eating/Feeding: Set up;Sitting   Grooming: Wash/dry hands;Contact guard assist;Standing   Upper Body Bathing: Set up;Sitting   Lower Body Bathing: Minimal assistance;Sit to/from stand   Upper Body Dressing : Supervision/safety;Sitting   Lower Body Dressing: Moderate assistance;Sit to/from stand   Toilet Transfer: Minimal assistance;Regular Toilet;Rolling walker (2 wheels);Ambulation                   Vision Baseline Vision/History: 1 Wears glasses Patient Visual Report: No change from baseline       Perception Perception: Not tested       Praxis Praxis: Not tested       Pertinent Vitals/Pain Pain Assessment Pain Assessment: No/denies pain  Extremity/Trunk Assessment Upper Extremity Assessment Upper Extremity Assessment: Generalized weakness   Lower Extremity Assessment Lower Extremity Assessment: Defer to PT evaluation   Cervical / Trunk Assessment Cervical / Trunk Assessment: Kyphotic   Communication Communication Communication: Impaired Factors Affecting Communication: Hearing impaired   Cognition  Arousal: Alert Behavior During Therapy: WFL for tasks assessed/performed Cognition: No apparent impairments                               Following commands: Intact       Cueing  General Comments   Cueing Techniques: Verbal cues;Gestural cues   VSS on supplemental O2   Exercises     Shoulder Instructions      Home Living Family/patient expects to be discharged to:: Private residence Living Arrangements: Spouse/significant other;Other (Comment) (PCA for spouse 5x/wk.  Daughter PRN.) Available Help at Discharge: Family;Available PRN/intermittently Type of Home: House Home Access: Ramped entrance     Home Layout: One level         Bathroom Toilet: Standard Bathroom Accessibility: Yes   Home Equipment: Agricultural consultant (2 wheels);Cane - single point;Shower seat - built in;Grab bars - tub/shower;Hand held shower head          Prior Functioning/Environment Prior Level of Function : Needs assist;History of Falls (last six months)             Mobility Comments: Ambulates with RW.  Stating he just completed HH PT ADLs Comments: States Mod I with ADL completion.    OT Problem List: Decreased strength;Decreased activity tolerance;Impaired balance (sitting and/or standing);Decreased safety awareness   OT Treatment/Interventions: Self-care/ADL training;Therapeutic activities;Patient/family education;DME and/or AE instruction;Balance training      OT Goals(Current goals can be found in the care plan section)   Acute Rehab OT Goals Patient Stated Goal: Return home OT Goal Formulation: With patient Time For Goal Achievement: 03/11/24 Potential to Achieve Goals: Fair ADL Goals Pt Will Perform Grooming: with modified independence;standing Pt Will Perform Lower Body Dressing: with modified independence;sit to/from stand;sitting/lateral leans Pt Will Transfer to Toilet: with modified independence;regular height toilet;ambulating   OT Frequency:  Min  2X/week    Co-evaluation              AM-PAC OT "6 Clicks" Daily Activity     Outcome Measure Help from another person eating meals?: None Help from another person taking care of personal grooming?: A Little Help from another person toileting, which includes using toliet, bedpan, or urinal?: A Lot Help from another person bathing (including washing, rinsing, drying)?: A Lot Help from another person to put on and taking off regular upper body clothing?: A Little Help from another person to put on and taking off regular lower body clothing?: A Lot 6 Click Score: 16   End of Session Equipment Utilized During Treatment: Rolling walker (2 wheels);Oxygen Nurse Communication: Mobility status  Activity Tolerance: Patient limited by fatigue Patient left: in bed;with call bell/phone within reach;with bed alarm set  OT Visit Diagnosis: Unsteadiness on feet (R26.81);Muscle weakness (generalized) (M62.81);History of falling (Z91.81)                Time: 1435-1456 OT Time Calculation (min): 21 min Charges:  OT General Charges $OT Visit: 1 Visit OT Evaluation $OT Eval Moderate Complexity: 1 Mod  02/26/2024  RP, OTR/L  Acute Rehabilitation Services  Office:  [phone]   [name] 02/26/2024, 3:04 PM

## 2024-02-27 ENCOUNTER — Inpatient Hospital Stay (HOSPITAL_COMMUNITY)

## 2024-02-27 DIAGNOSIS — J9601 Acute respiratory failure with hypoxia: Secondary | ICD-10-CM | POA: Diagnosis not present

## 2024-02-27 LAB — CBC
HCT: 31.7 % — ABNORMAL LOW (ref 39.0–52.0)
Hemoglobin: 9.6 g/dL — ABNORMAL LOW (ref 13.0–17.0)
MCH: 27.5 pg (ref 26.0–34.0)
MCHC: 30.3 g/dL (ref 30.0–36.0)
MCV: 90.8 fL (ref 80.0–100.0)
Platelets: 197 10*3/uL (ref 150–400)
RBC: 3.49 MIL/uL — ABNORMAL LOW (ref 4.22–5.81)
RDW: 18.6 % — ABNORMAL HIGH (ref 11.5–15.5)
WBC: 9.5 10*3/uL (ref 4.0–10.5)
nRBC: 0 % (ref 0.0–0.2)

## 2024-02-27 LAB — BASIC METABOLIC PANEL WITH GFR
Anion gap: 10 (ref 5–15)
BUN: 45 mg/dL — ABNORMAL HIGH (ref 8–23)
CO2: 27 mmol/L (ref 22–32)
Calcium: 8.8 mg/dL — ABNORMAL LOW (ref 8.9–10.3)
Chloride: 105 mmol/L (ref 98–111)
Creatinine, Ser: 1.63 mg/dL — ABNORMAL HIGH (ref 0.61–1.24)
GFR, Estimated: 39 mL/min — ABNORMAL LOW (ref >60)
Glucose, Bld: 220 mg/dL — ABNORMAL HIGH (ref 70–99)
Potassium: 4.1 mmol/L (ref 3.5–5.1)
Sodium: 142 mmol/L (ref 135–145)

## 2024-02-27 LAB — ACID FAST SMEAR (AFB, MYCOBACTERIA): Acid Fast Smear: NEGATIVE

## 2024-02-27 LAB — GLUCOSE, CAPILLARY
Glucose-Capillary: 233 mg/dL — ABNORMAL HIGH (ref 70–99)
Glucose-Capillary: 156 mg/dL — ABNORMAL HIGH (ref 70–99)
Glucose-Capillary: 215 mg/dL — ABNORMAL HIGH (ref 70–99)
Glucose-Capillary: 228 mg/dL — ABNORMAL HIGH (ref 70–99)

## 2024-02-27 LAB — CYTOLOGY - NON PAP

## 2024-02-27 NOTE — Inpatient Diabetes Management (Signed)
 Inpatient Diabetes Program Recommendations  AACE/ADA: New Consensus Statement on Inpatient Glycemic Control (2015)  Target Ranges:  Prepandial:   less than 140 mg/dL      Peak postprandial:   less than 180 mg/dL (1-2 hours)      Critically ill patients:  140 - 180 mg/dL   Lab Results  Component Value Date   GLUCAP 233 (H) 02/27/2024   HGBA1C 10.5 (H) 02/25/2024    Review of Glycemic Control  Latest Reference Range & Units 02/26/24 07:25 02/26/24 11:40 02/26/24 16:46 02/26/24 20:36 02/27/24 07:36  Glucose-Capillary 70 - 99 mg/dL 161 (H) 096 (H) 045 (H) 219 (H) 233 (H)  (H): Data is abnormally high  Diabetes history: DM2 Outpatient Diabetes medications:  Lantus  4-9 units every day Jardiance  25 mg every day Actos 45 mg QD Current orders for Inpatient glycemic control:  Novolog  0-9 units TID  Inpatient Diabetes Program Recommendations:    If he remains inpatient, might consider:  Semglee  5 units every day  Will continue to follow while inpatient.  Thank you, Hays Lipschutz, MSN, CDCES Diabetes Coordinator Inpatient Diabetes Program [phone] (team pager from 8a-5p)

## 2024-02-27 NOTE — Progress Notes (Signed)
 PROGRESS NOTE    [name]  PPI:[id number] DOB: Jan 19, 1930 DOA: 02/25/2024 PCP: Gwyndolyn Lerner, PA-C   Brief Narrative: This 89 y.o. male with PMH significant for hypertension, hyperlipidemia, type 2 diabetes, hard of hearing, history of atrial flutter, not on anticoagulation due to recurrent falls, aortic insufficiency, presented in the ED with progressive shortness of breath for last few days. He was hypoxic, SPO2 was 75% on room air in ED, requiring up to 4 L of supplemental oxygen.  X-Husein and CT chest shows large left pleural effusion.  IR consulted,  Patient underwent thoracocentesis.  1.4 L of clear fluid drained. Patient feels much improved,  He still remains on 4 L of oxygen. Patient denies any chest pain.  Patient was admitted for further evaluation.   Assessment & Plan:   Principal Problem:   Acute hypoxic respiratory failure (HCC) Active Problems:   AF (paroxysmal atrial fibrillation) (HCC)   Coronary artery disease involving native coronary artery of native heart   Uncontrolled type 2 diabetes mellitus with hyperglycemia, with long-term current use of insulin  (HCC)   Essential hypertension   Hyperlipidemia   Basal cell carcinoma of scalp and skin of neck   Malignant melanoma of head and neck (HCC)  Acute hypoxic respiratory failure : Large left pleural effusion: Patient presented with progressive shortness of breath, hypoxia requiring supplemental oxygen,  Now on 4 L of supplemental oxygen.   Chest x-Gerell showed large left pleural effusion.  BNP found to be 388.5 CT chest shows large left pleural effusion. IR consulted,  Patient underwent ultrasound-guided thoracocentesis 1.4 L of clear fluid drained. Continue supplemental oxygen and wean as tolerated. There is no signs of infection or infiltrate on chest x-Damarius.  Hold on antibiotics. Continue Lasix  40 mg IV daily.  Monitor intake / output charting. Patient reports feeling much better.  Obtain chest x-Tykeem.    CAD: Patient denies chest pain, Palpitations. Continue Imdur , Cardizem , aspirin , Lipitor .   Atrial fibrillation: Heart rate controlled, continue Cardizem . Patient is not on anticoagulation due to old age / recurrent falls.   Diabetes mellitus: Hold on p.o. diabetic medications. Continue regular insulin  sliding scale, Carb modified diet.   Essential hypertension: Continue Cardizem , hold on losartan .   Hyperlipidemia: Continue Lipitor  40 mg daily.   History of malignant melanoma of head and neck: Patient does have history of malignant melanoma in the past. This effusion could be related to this.  Obtain CT chest.   Hard of Hearing: Continue supportive care.  Acute kidney injury: Likely due to decreased intake and Lasix . Monitor serum creatinine.   DVT prophylaxis: SCDs Code Status: DNR Family Communication: Spoke with daughter [name] on phone. Disposition Plan:    Status is: Inpatient Remains inpatient appropriate because: Admitted for acute hypoxic respiratory failure secondary to left large pleural effusion requiring thoracocentesis.  PT and OT recommended SNF.  TOC working on finding a place.    Consultants:  None  Procedures: Thoracocentesis Antimicrobials: None  Subjective: Patient was seen and examined at bedside.  Overnight events noted. Patient reports feeling much improved after thoracocentesis. He was sitting comfortably on the chair.  He is weaned down to 2 L of supplemental oxygen  Objective: Vitals:   02/26/24 1400 02/26/24 1928 02/27/24 0439 02/27/24 1139  BP:  (!) 109/44 (!) 114/53 (!) 106/36  Pulse:  74 80 64  Resp:  15 18   Temp:  98.5 F (36.9 C) 97.9 F (36.6 C) 98.8 F (37.1 C)  TempSrc:  Oral  Oral  SpO2: 92% 100% 92% 92%  Weight:      Height:        Intake/Output Summary (Last 24 hours) at 02/27/2024 1313 Last data filed at 02/27/2024 0900 Gross per 24 hour  Intake 480 ml  Output 1300 ml  Net -820 ml   Filed Weights   02/26/24  0521  Weight: 79.5 kg    Examination:  General exam: Appears calm and comfortable, not in any acute distress.  Deconditioned Respiratory system: CTA bilaterally. Respiratory effort normal.  RR 15 Cardiovascular system: S1 & S2 heard, RRR. No JVD, murmurs, rubs, gallops or clicks. Gastrointestinal system: Abdomen is non distended, soft and non tender.  Normal bowel sounds heard. Central nervous system: Alert and oriented X 3. No focal neurological deficits. Extremities: No edema, no cyanosis, no clubbing. Skin: No rashes, lesions or ulcers Psychiatry: Judgement and insight appear normal. Mood & affect appropriate.     Data Reviewed: I have personally reviewed following labs and imaging studies  CBC: Recent Labs  Lab 02/25/24 1206 02/26/24 0545 02/27/24 0528  WBC 7.8 9.8 9.5  HGB 9.2* 10.3* 9.6*  HCT 30.2* 35.3* 31.7*  MCV 88.8 92.2 90.8  PLT 244 259 197   Basic Metabolic Panel: Recent Labs  Lab 02/25/24 1206 02/26/24 0715 02/27/24 0528  NA 140 143 142  K 4.1 3.8 4.1  CL 108 105 105  CO2 23 29 27   GLUCOSE 345* 144* 220*  BUN 33* 31* 45*  CREATININE 1.17 1.41* 1.63*  CALCIUM  8.7* 9.3 8.8*  MG  --  2.2  --   PHOS  --  4.4  --    GFR: Estimated Creatinine Clearance: 31.7 mL/min (A) (by C-G formula based on SCr of 1.63 mg/dL (H)). Liver Function Tests: Recent Labs  Lab 02/26/24 0715  AST 14*  ALT 13  ALKPHOS 100  BILITOT 1.2  PROT 6.4*  ALBUMIN 3.0*   No results for input(s): "LIPASE", "AMYLASE" in the last 168 hours. No results for input(s): "AMMONIA" in the last 168 hours. Coagulation Profile: No results for input(s): "INR", "PROTIME" in the last 168 hours. Cardiac Enzymes: No results for input(s): "CKTOTAL", "CKMB", "CKMBINDEX", "TROPONINI" in the last 168 hours. BNP (last 3 results) No results for input(s): "PROBNP" in the last 8760 hours. HbA1C: Recent Labs    02/25/24 1616  HGBA1C 10.5*   CBG: Recent Labs  Lab 02/26/24 1140 02/26/24 1646  02/26/24 2036 02/27/24 0736 02/27/24 1135  GLUCAP 236* 242* 219* 233* 156*   Lipid Profile: No results for input(s): "CHOL", "HDL", "LDLCALC", "TRIG", "CHOLHDL", "LDLDIRECT" in the last 72 hours. Thyroid Function Tests: No results for input(s): "TSH", "T4TOTAL", "FREET4", "T3FREE", "THYROIDAB" in the last 72 hours. Anemia Panel: No results for input(s): "VITAMINB12", "FOLATE", "FERRITIN", "TIBC", "IRON", "RETICCTPCT" in the last 72 hours. Sepsis Labs: No results for input(s): "PROCALCITON", "LATICACIDVEN" in the last 168 hours.  Recent Results (from the past 240 hours)  Acid Fast Smear (AFB)     Status: None   Collection Time: 02/25/24  3:54 PM   Specimen: PATH Cytology Pleural fluid  Result Value Ref Range Status   AFB Specimen Processing Direct Inoculation  Final   Acid Fast Smear Negative  Final    Comment: (NOTE) Performed At: [facility] [address] [place], [place] [id number] Pearlean Botts MD VO:[phone]    Source (AFB) PLEURAL  Final    Comment: LEFT SIDE Performed at [facility], [address], [place], [place] [address]   Culture,  body fluid w Gram Stain-bottle     Status: None (Preliminary result)   Collection Time: 02/25/24  3:54 PM   Specimen: Fluid  Result Value Ref Range Status   Specimen Description FLUID PLEURAL LEFT  Final   Special Requests BOTTLES DRAWN AEROBIC AND ANAEROBIC  Final   Culture   Final    NO GROWTH 2 DAYS Performed at [facility], [address], [place], [place] [address]    Report Status PENDING  Incomplete  Gram stain     Status: None   Collection Time: 02/25/24  3:54 PM   Specimen: Fluid  Result Value Ref Range Status   Specimen Description FLUID PLEURAL LEFT  Final   Special Requests NONE  Final   Gram Stain   Final    WBC PRESENT, PREDOMINANTLY MONONUCLEAR NO ORGANISMS SEEN CYTOSPIN SMEAR Performed at [facility], [address] [place], [place] [address]    Report Status  02/25/2024 FINAL  Final    Radiology Studies: DG Chest 1 View Result Date: 02/25/2024 CLINICAL DATA:  Status post thoracentesis. EXAM: CHEST  1 VIEW COMPARISON:  Same-day radiograph at 12:19 p.m. FINDINGS: Stable cardiomediastinal silhouette. Aortic atherosclerosis. Decreased size of a large left pleural effusion with improved aeration in the left upper lung. No definite pneumothorax. Left lung opacities with persistent left lower lobe volume loss/collapse. Similar small right pleural effusion with right basilar opacities. IMPRESSION: 1. Decreased size of a large left pleural effusion with improved aeration in the left upper lung. Persistent left lung opacities and lower lobe atelectasis/collapse. No definite pneumothorax. 2. Small right pleural effusion with right basilar opacities. Electronically Signed   By: Mannie Seek M.D.   On: 02/25/2024 17:15   US  THORACENTESIS ASP PLEURAL SPACE W/IMG GUIDE Result Date: 02/25/2024 INDICATION: Patient with history of coronary artery disease, atrial fibrillation, malignant melanoma of head and neck, dyspnea, left pleural effusion; request received for diagnostic and therapeutic left thoracentesis. EXAM: ULTRASOUND GUIDED DIAGNOSTIC AND THERAPEUTIC LEFT THORACENTESIS MEDICATIONS: 8 mL 1% lidocaine  COMPLICATIONS: None immediate. PROCEDURE: An ultrasound guided thoracentesis was thoroughly discussed with the patient and questions answered. The benefits, risks, alternatives and complications were also discussed. The patient understands and wishes to proceed with the procedure. Written consent was obtained. Ultrasound was performed to localize and mark an adequate pocket of fluid in the left chest. The area was then prepped and draped in the normal sterile fashion. 1% Lidocaine  was used for local anesthesia. Under ultrasound guidance a 6 Fr Safe-T-Centesis catheter was introduced. Thoracentesis was performed. The catheter was removed and a dressing applied. FINDINGS: A  total of approximately 1.6 liters of yellow fluid was removed. Samples were sent to the laboratory as requested by the clinical team. Due to patient coughing and this being patient's initial thoracentesis only the above amount of fluid was removed today. IMPRESSION: Successful ultrasound guided diagnostic and therapeutic left thoracentesis yielding 1.6 liters of pleural fluid. Performed by: Wash Hack Electronically Signed   By: Melven Stable.  Shick M.D.   On: 02/25/2024 16:21   CT CHEST WO CONTRAST Result Date: 02/25/2024 CLINICAL DATA:  Pneumonia, pleural effusion EXAM: CT CHEST WITHOUT CONTRAST TECHNIQUE: Multidetector CT imaging of the chest was performed following the standard protocol without IV contrast. RADIATION DOSE REDUCTION: This exam was performed according to the departmental dose-optimization program which includes automated exposure control, adjustment of the mA and/or kV according to patient size and/or use of iterative reconstruction technique. COMPARISON:  August 13, 2023 FINDINGS: Cardiovascular: Choose  1. Extensive calcification of the ascending aorta and coronary arteries without aneurysms. No significant pericardial effusion. Mediastinum/Nodes: No enlarged mediastinal or axillary lymph nodes. Thyroid gland, trachea, and esophagus demonstrate no significant findings. Lungs/Pleura: Large left pleural effusion with significant left lung collapse or consolidation or combination of both with the infiltrates of the left upper lobe that could correlate with pneumonia. Small right pleural effusion with right lower lobe infiltrates and atelectasis. Upper Abdomen: No acute abnormality. Musculoskeletal: No chest wall mass or suspicious bone lesions identified. IMPRESSION: *Large left pleural effusion with significant left lung collapse or consolidation or combination of both with the infiltrates of the left upper lobe that could correlate with pneumonia. *Small right pleural effusion with right lower lobe  infiltrates and atelectasis. Electronically Signed   By: Fredrich Jefferson M.D.   On: 02/25/2024 15:53   Scheduled Meds:  atorvastatin   80 mg Oral QPM   diltiazem   180 mg Oral Daily   docusate sodium   100 mg Oral BID   furosemide   40 mg Intravenous Daily   gabapentin   600 mg Oral TID   insulin  aspart  0-9 Units Subcutaneous TID WC   isosorbide  mononitrate  30 mg Oral Daily   senna  1 tablet Oral BID   traZODone   100 mg Oral QHS   Continuous Infusions:     LOS: 2 days    Time spent: 35 Mins    Magdalene School, MD [facility]   If 7PM-7AM, please contact night-coverage

## 2024-02-27 NOTE — Plan of Care (Signed)

## 2024-02-27 NOTE — TOC Initial Note (Addendum)
 Transition of Care [facility]) - Initial/Assessment Note    Patient Details  Name: [name] MRN: [id number] Date of Birth: 1930-03-31  [facility] CM/SW Contact:    Jonni Nettle, LCSW Phone Number: 02/27/2024, 11:46 AM  Clinical Narrative:                 ADDENDUM  CSW spoke with pt's wife, Philopateer Hevener, to discuss SNF placement. Pt's wife reports [facility] is their preferred option, then [facility] is a second option. CSW advised pt's wife that bed may not be available at either facility, however CSW will reach out to facility staff to request review of referral. Wife verbalized understanding. TOC will continue to follow.  CSW met with pt at bedside to discuss PT's recommendation for short-term SNF placement. CSW unable to speak with pt due to pt's hearing impairment, despite use of hearing aids. CSW spoke with pt's spouse, Jaysen Venner [phone], via phone call. CSW explained recommendation for short-term SNF placement and process of sending referrals. Pt's spouse reports pt was at [facility] in [place] for rehab a few years ago. [facility] is pt/spouse preferred facility. CSW explained that referral can be sent to [facility] and other SNF in [place], but does not guarantee bed offer. Pt's wife reported understanding. Wife reports she would like to discuss SNF placement with daughter who is a Engineer, civil (consulting), before proceeding any further. CSW will follow up with wife this afternoon.    Expected Discharge Plan: Skilled Nursing Facility Barriers to Discharge: Continued Medical Work up, SNF Pending bed offer   Patient Goals and CMS Choice Patient states their goals for this hospitalization and ongoing recovery are:: To get rehab at [facility] CMS Medicare.gov Compare Post Acute Care list provided to:: Patient Represenative (must comment) (pt's wife, Kasim Frattini) Choice offered to / list presented to : Spouse, Adult Children [place] ownership interest  in [facility].provided to:: Spouse    Expected Discharge Plan and Services In-house Referral: Clinical Social Work Discharge Planning Services: NA Post Acute Care Choice: Skilled Nursing Facility Living arrangements for the past 2 months: Single Family Home                 DME Arranged: N/A DME Agency: NA       HH Arranged: NA HH Agency: NA        Prior Living Arrangements/Services Living arrangements for the past 2 months: Single Family Home Lives with:: Self, Spouse Patient language and need for interpreter reviewed:: Yes Do you feel safe going back to the place where you live?: Yes      Need for Family Participation in Patient Care: Yes (Comment) Care giver support system in place?: Yes (comment)   Criminal Activity/Legal Involvement Pertinent to Current Situation/Hospitalization: No - Comment as needed  Activities of Daily Living   ADL Screening (condition at time of admission) Independently performs ADLs?: Yes (appropriate for developmental age) Is the patient deaf or have difficulty hearing?: Yes Does the patient have difficulty seeing, even when wearing glasses/contacts?: No Does the patient have difficulty concentrating, remembering, or making decisions?: No  Permission Sought/Granted Permission sought to share information with : Family Supports, Oceanographer granted to share information with : Yes, Verbal Permission Granted  Share Information with NAME: Merek Kasparian     Permission granted to share info w Relationship: Spouse  Permission granted to share info w Contact Information: [phone]  Emotional Assessment Appearance:: Appears stated age Attitude/Demeanor/Rapport: Unable to Assess  Affect (typically observed): Unable to Assess Orientation: : Oriented to Self, Oriented to Place, Oriented to  Time, Oriented to Situation Alcohol / Substance Use: Not Applicable Psych Involvement: No (comment)  Admission diagnosis:   Hypoxia [R09.02] Acute hypoxic respiratory failure (HCC) [J96.01] Patient Active Problem List   Diagnosis Date Noted   Acute hypoxic respiratory failure (HCC) 02/25/2024   Acute upper GI bleeding 05/21/2023   GI bleed 05/20/2023   Pneumonia of right upper lobe due to infectious organism 02/21/2023   Coronary artery disease involving native coronary artery of native heart 02/21/2023   Toxic metabolic encephalopathy 02/20/2023   Pulmonary vascular congestion 02/20/2023   Actinic keratosis 07/08/2022   Arrhythmia 07/08/2022   Atrial flutter (HCC) 07/08/2022   Basal cell carcinoma of scalp and skin of neck 07/08/2022   Diabetic peripheral neuropathy associated with type 2 diabetes mellitus (HCC) 07/08/2022   Hypertrophy of prostate with urinary obstruction and other lower urinary tract symptoms (LUTS) 07/08/2022   Inflamed seborrheic keratosis 07/08/2022   Insomnia 07/08/2022   Malignant melanoma of head and neck (HCC) 07/08/2022   Osteoporosis 07/08/2022   Pain in left knee 07/08/2022   Type 2 (non-insulin  dependent type) or unspecified type diabetes mellitus with neurological manifestations, uncontrolled 07/08/2022   Venous (peripheral) insufficiency 07/08/2022   Uncontrolled type 2 diabetes mellitus with hyperglycemia, with long-term current use of insulin  (HCC) 01/20/2022   Chest pain 08/01/2021   Gastritis 06/08/2019   Pancreatic lesion 06/07/2019   AF (paroxysmal atrial fibrillation) (HCC) 06/06/2019   Dehydration, mild 06/06/2019   Melanoma in situ of neck (HCC) 01/18/2016   Open angle with borderline findings and low glaucoma risk in both eyes 07/21/2014   Seasonal allergies 02/14/2014   H/O carotid stenosis 01/21/2014   Arthritis of left knee 01/05/2014   Essential hypertension 11/05/2013   Hyperlipidemia 11/05/2013   Diabetes (HCC) 11/05/2013   Edema 09/07/2013   Peripheral neuropathy 09/07/2013   Aftercare following surgery of the circulatory system, NEC 06/30/2013    Occlusion and stenosis of carotid artery without mention of cerebral infarction 06/30/2012   PCP:  Gwyndolyn Lerner, PA-C Pharmacy:   [facility] [phone] - [place], [place] - [address] [place] [address] Phone: [phone] Fax: [phone]     Social Drivers of Health (SDOH) Social History: SDOH Screenings   Food Insecurity: No Food Insecurity (05/21/2023)  Housing: Low Risk  (05/21/2023)  Transportation Needs: No Transportation Needs (05/21/2023)  Utilities: Not At Risk (05/21/2023)  Tobacco Use: Medium Risk (02/26/2024)   SDOH Interventions:     Readmission Risk Interventions    02/26/2024   10:35 AM 02/22/2023    1:30 PM  Readmission Risk Prevention Plan  Transportation Screening Complete Complete  PCP or Specialist Appt within 5-7 Days Complete   PCP or Specialist Appt within 3-5 Days  Complete  Home Care Screening Complete   Medication Review (RN CM) Complete   HRI or Home Care Consult  Complete  Social Work Consult for Recovery Care Planning/Counseling  Complete  Palliative Care Screening  Not Applicable  Medication Review (RN Care Manager)  Complete   [place], MSW, LCSW 02/27/2024 11:53 AM

## 2024-02-27 NOTE — Plan of Care (Signed)
 Problem: Education: Goal: Ability to describe self-care measures that may prevent or decrease complications (Diabetes Survival Skills Education) will improve 02/27/2024 0307 by Lonney Robe, RN Outcome: Progressing 02/27/2024 0307 by Lonney Robe, RN Outcome: Progressing Goal: Individualized Educational Video(s) 02/27/2024 0307 by Lonney Robe, RN Outcome: Progressing 02/27/2024 0307 by Lonney Robe, RN Outcome: Progressing   Problem: Coping: Goal: Ability to adjust to condition or change in health will improve 02/27/2024 0307 by Lonney Robe, RN Outcome: Progressing 02/27/2024 0307 by Lonney Robe, RN Outcome: Progressing   Problem: Fluid Volume: Goal: Ability to maintain a balanced intake and output will improve 02/27/2024 0307 by Lonney Robe, RN Outcome: Progressing 02/27/2024 0307 by Lonney Robe, RN Outcome: Progressing   Problem: Health Behavior/Discharge Planning: Goal: Ability to identify and utilize available resources and services will improve 02/27/2024 0307 by Lonney Robe, RN Outcome: Progressing 02/27/2024 0307 by Lonney Robe, RN Outcome: Progressing Goal: Ability to manage health-related needs will improve 02/27/2024 0307 by Lonney Robe, RN Outcome: Progressing 02/27/2024 0307 by Lonney Robe, RN Outcome: Progressing   Problem: Metabolic: Goal: Ability to maintain appropriate glucose levels will improve 02/27/2024 0307 by Lonney Robe, RN Outcome: Progressing 02/27/2024 0307 by Lonney Robe, RN Outcome: Progressing   Problem: Nutritional: Goal: Maintenance of adequate nutrition will improve 02/27/2024 0307 by Lonney Robe, RN Outcome: Progressing 02/27/2024 0307 by Lonney Robe, RN Outcome: Progressing Goal: Progress toward achieving an optimal weight will improve 02/27/2024 0307 by Lonney Robe, RN Outcome: Progressing 02/27/2024 0307 by Lonney Robe, RN Outcome: Progressing   Problem: Skin Integrity: Goal: Risk for  impaired skin integrity will decrease 02/27/2024 0307 by Lonney Robe, RN Outcome: Progressing 02/27/2024 0307 by Lonney Robe, RN Outcome: Progressing   Problem: Tissue Perfusion: Goal: Adequacy of tissue perfusion will improve 02/27/2024 0307 by Lonney Robe, RN Outcome: Progressing 02/27/2024 0307 by Lonney Robe, RN Outcome: Progressing   Problem: Education: Goal: Knowledge of General Education information will improve Description: Including pain rating scale, medication(s)/side effects and non-pharmacologic comfort measures 02/27/2024 0307 by Lonney Robe, RN Outcome: Progressing 02/27/2024 0307 by Lonney Robe, RN Outcome: Progressing   Problem: Health Behavior/Discharge Planning: Goal: Ability to manage health-related needs will improve 02/27/2024 0307 by Lonney Robe, RN Outcome: Progressing 02/27/2024 0307 by Lonney Robe, RN Outcome: Progressing   Problem: Clinical Measurements: Goal: Ability to maintain clinical measurements within normal limits will improve 02/27/2024 0307 by Lonney Robe, RN Outcome: Progressing 02/27/2024 0307 by Lonney Robe, RN Outcome: Progressing Goal: Will remain free from infection 02/27/2024 0307 by Lonney Robe, RN Outcome: Progressing 02/27/2024 0307 by Lonney Robe, RN Outcome: Progressing Goal: Diagnostic test results will improve 02/27/2024 0307 by Lonney Robe, RN Outcome: Progressing 02/27/2024 0307 by Lonney Robe, RN Outcome: Progressing Goal: Respiratory complications will improve 02/27/2024 0307 by Lonney Robe, RN Outcome: Progressing 02/27/2024 0307 by Lonney Robe, RN Outcome: Progressing Goal: Cardiovascular complication will be avoided 02/27/2024 0307 by Lonney Robe, RN Outcome: Progressing 02/27/2024 0307 by Lonney Robe, RN Outcome: Progressing   Problem: Activity: Goal: Risk for activity intolerance will decrease 02/27/2024 0307 by Lonney Robe, RN Outcome: Progressing 02/27/2024 0307  by Lonney Robe, RN Outcome: Progressing   Problem: Nutrition: Goal: Adequate nutrition will be maintained 02/27/2024 0307 by Lonney Robe, RN Outcome: Progressing 02/27/2024 0307 by Lonney Robe, RN Outcome: Progressing  Problem: Coping: Goal: Level of anxiety will decrease 02/27/2024 0307 by Lonney Robe, RN Outcome: Progressing 02/27/2024 0307 by Lonney Robe, RN Outcome: Progressing   Problem: Elimination: Goal: Will not experience complications related to bowel motility 02/27/2024 0307 by Lonney Robe, RN Outcome: Progressing 02/27/2024 0307 by Lonney Robe, RN Outcome: Progressing Goal: Will not experience complications related to urinary retention 02/27/2024 0307 by Lonney Robe, RN Outcome: Progressing 02/27/2024 0307 by Lonney Robe, RN Outcome: Progressing   Problem: Pain Managment: Goal: General experience of comfort will improve and/or be controlled 02/27/2024 0307 by Lonney Robe, RN Outcome: Progressing 02/27/2024 0307 by Lonney Robe, RN Outcome: Progressing   Problem: Safety: Goal: Ability to remain free from injury will improve 02/27/2024 0307 by Lonney Robe, RN Outcome: Progressing 02/27/2024 0307 by Lonney Robe, RN Outcome: Progressing   Problem: Skin Integrity: Goal: Risk for impaired skin integrity will decrease 02/27/2024 0307 by Lonney Robe, RN Outcome: Progressing 02/27/2024 0307 by Lonney Robe, RN Outcome: Progressing

## 2024-02-27 NOTE — NC FL2 (Signed)
 [place]  MEDICAID FL2 LEVEL OF CARE FORM     IDENTIFICATION  Patient Name: [name] Birthdate: 1930/08/03 Sex: male Admission Date (Current Location): 02/25/2024  [facility] [place] [facility] [place] and [place] Number:  Producer, television/film/video and Address:  [facility],  [address] [place], [place] [address]      Provider Number: [id number]  Attending Physician Name and Address:  Magdalene School, MD  Relative Name and Phone Number:  [name], Guzzetta [facility])  [phone]    Current Level of Care: Hospital Recommended Level of Care: Skilled Nursing Facility Prior Approval Number:    Date Approved/Denied:   PASRR Number: [id number]  Discharge Plan: SNF    Current Diagnoses: Patient Active Problem List   Diagnosis Date Noted   Acute hypoxic respiratory failure (HCC) 02/25/2024   Acute upper GI bleeding 05/21/2023   GI bleed 05/20/2023   Pneumonia of right upper lobe due to infectious organism 02/21/2023   Coronary artery disease involving native coronary artery of native heart 02/21/2023   Toxic metabolic encephalopathy 02/20/2023   Pulmonary vascular congestion 02/20/2023   Actinic keratosis 07/08/2022   Arrhythmia 07/08/2022   Atrial flutter (HCC) 07/08/2022   Basal cell carcinoma of scalp and skin of neck 07/08/2022   Diabetic peripheral neuropathy associated with type 2 diabetes mellitus (HCC) 07/08/2022   Hypertrophy of prostate with urinary obstruction and other lower urinary tract symptoms (LUTS) 07/08/2022   Inflamed seborrheic keratosis 07/08/2022   Insomnia 07/08/2022   Malignant melanoma of head and neck (HCC) 07/08/2022   Osteoporosis 07/08/2022   Pain in left knee 07/08/2022   Type 2 (non-insulin  dependent type) or unspecified type diabetes mellitus with neurological manifestations, uncontrolled 07/08/2022   Venous (peripheral) insufficiency 07/08/2022   Uncontrolled type 2 diabetes mellitus with hyperglycemia, with long-term current use of insulin  (HCC) 01/20/2022   Chest  pain 08/01/2021   Gastritis 06/08/2019   Pancreatic lesion 06/07/2019   AF (paroxysmal atrial fibrillation) (HCC) 06/06/2019   Dehydration, mild 06/06/2019   Melanoma in situ of neck (HCC) 01/18/2016   Open angle with borderline findings and low glaucoma risk in both eyes 07/21/2014   Seasonal allergies 02/14/2014   H/O carotid stenosis 01/21/2014   Arthritis of left knee 01/05/2014   Essential hypertension 11/05/2013   Hyperlipidemia 11/05/2013   Diabetes (HCC) 11/05/2013   Edema 09/07/2013   Peripheral neuropathy 09/07/2013   Aftercare following surgery of the circulatory system, NEC 06/30/2013   Occlusion and stenosis of carotid artery without mention of cerebral infarction 06/30/2012    Orientation RESPIRATION BLADDER Height & Weight     Self, Time, Situation, Place  O2 (2L) Continent Weight: 175 lb 4.3 oz (79.5 kg) Height:  6' (182.9 cm)  BEHAVIORAL SYMPTOMS/MOOD NEUROLOGICAL BOWEL NUTRITION STATUS      Continent Diet (Carb modified)  AMBULATORY STATUS COMMUNICATION OF NEEDS Skin   Limited Assist Verbally Normal                       Personal Care Assistance Level of Assistance  Bathing, Feeding, Dressing Bathing Assistance: Limited assistance Feeding assistance: Independent Dressing Assistance: Limited assistance     Functional Limitations Info  Sight, Hearing, Speech Sight Info: Impaired (eye glasses) Hearing Info: Impaired (hearing aids) Speech Info: Adequate    SPECIAL CARE FACTORS FREQUENCY  PT (By licensed PT), OT (By licensed OT)     PT Frequency: 5x per week OT Frequency: 5x per week            Contractures Contractures  Info: Not present    Additional Factors Info  Code Status, Allergies Code Status Info: DNR Allergies Info: NKA           Current Medications (02/27/2024):  This is the current hospital active medication list Current Facility-Administered Medications  Medication Dose Route Frequency Provider Last Rate Last Admin    acetaminophen  (TYLENOL ) tablet 650 mg  650 mg Oral Q6H PRN Magdalene School, MD   650 mg at 02/26/24 0915   Or   acetaminophen  (TYLENOL ) suppository 650 mg  650 mg Rectal Q6H PRN Magdalene School, MD       atorvastatin  (LIPITOR ) tablet 80 mg  80 mg Oral QPM Khatri, Pardeep, MD   80 mg at 02/26/24 1701   diltiazem  (CARDIZEM  CD) 24 hr capsule 180 mg  180 mg Oral Daily Magdalene School, MD   180 mg at 02/27/24 0810   docusate sodium  (COLACE) capsule 100 mg  100 mg Oral BID Magdalene School, MD   100 mg at 02/27/24 0810   furosemide  (LASIX ) injection 40 mg  40 mg Intravenous Daily Magdalene School, MD   40 mg at 02/27/24 0810   gabapentin  (NEURONTIN ) capsule 600 mg  600 mg Oral TID Magdalene School, MD   600 mg at 02/27/24 0810   insulin  aspart (novoLOG ) injection 0-9 Units  0-9 Units Subcutaneous TID WC Magdalene School, MD   3 Units at 02/27/24 1610   isosorbide  mononitrate (IMDUR ) 24 hr tablet 30 mg  30 mg Oral Daily Magdalene School, MD   30 mg at 02/27/24 0810   ondansetron  (ZOFRAN ) tablet 4 mg  4 mg Oral Q6H PRN Magdalene School, MD   4 mg at 02/26/24 0915   Or   ondansetron  (ZOFRAN ) injection 4 mg  4 mg Intravenous Q6H PRN Magdalene School, MD       senna (SENOKOT) tablet 8.6 mg  1 tablet Oral BID Magdalene School, MD   8.6 mg at 02/27/24 0810   traZODone  (DESYREL ) tablet 100 mg  100 mg Oral QHS Magdalene School, MD   100 mg at 02/26/24 2146     Discharge Medications: Please see discharge summary for a list of discharge medications.  Relevant Imaging Results:  Relevant Lab Results:   Additional Information SSN: [id number]  Melinda Sprawls Uliana Brinker, LCSW

## 2024-02-27 NOTE — Evaluation (Signed)
 Physical Therapy Evaluation Patient Details Name: [name] MRN: [id number] DOB: 16-May-1930 Today's Date: 02/27/2024  History of Present Illness  89 y.o. male with PMH significant for hypertension, hyperlipidemia, type 2 diabetes, hard of hearing, history of atrial flutter, not on anticoagulation due to recurrent falls, aortic insufficiency, presented 5/7 with progressive shortness of breath for last few days.  He was hypoxic, requiring up to 4 L.  Clinical Impression  Pt admitted with above diagnosis.  Pt currently with functional limitations due to the deficits listed below (see PT Problem List). Pt will benefit from acute skilled PT to increase their independence and safety with mobility to allow discharge.     The patient is alert, vry HOH, communicated with sritten words.  Patient resides at homme with spouse, was mod I with RW. No family present. Patient currently presents with weakness, requiring mod assistance for mobility. Patient maintained on 2 LPM, Spo2 94 pre, did not pick up after transfer. Not SOB.  Patient will benefit from continued inpatient follow up therapy, <3 hours/day unless family  available to provide increased support for safe  mobility.       If plan is discharge home, recommend the following: Two people to help with walking and/or transfers;A lot of help with bathing/dressing/bathroom;Assist for transportation;Help with stairs or ramp for entrance   Can travel by private vehicle   No    Equipment Recommendations None recommended by PT  Recommendations for Other Services       Functional Status Assessment Patient has had a recent decline in their functional status and demonstrates the ability to make significant improvements in function in a reasonable and predictable amount of time.     Precautions / Restrictions Precautions Precautions: Fall Precaution/Restrictions Comments: Watch O2 Restrictions Weight Bearing Restrictions Per Provider Order: No       Mobility  Bed Mobility   Bed Mobility: Supine to Sit     Supine to sit: Mod assist, HOB elevated, Used rails     General bed mobility comments: assist to raise trunk , mod assist to scoot to bed edge    Transfers Overall transfer level: Needs assistance Equipment used: Rolling walker (2 wheels) Transfers: Sit to/from Stand, Bed to chair/wheelchair/BSC Sit to Stand: Min assist, Mod assist   Step pivot transfers: Min assist       General transfer comment: Mod assist to power up from raised bed, Min assist to step to recliner, stood x 3 minutes to urinate with Primo in place    Ambulation/Gait                  Stairs            Wheelchair Mobility     Tilt Bed    Modified Rankin (Stroke Patients Only)       Balance Overall balance assessment: Needs assistance, History of Falls Sitting-balance support: Feet supported Sitting balance-Leahy Scale: Fair     Standing balance support: Reliant on assistive device for balance, Bilateral upper extremity supported, During functional activity Standing balance-Leahy Scale: Poor                               Pertinent Vitals/Pain Pain Assessment Pain Assessment: Faces Faces Pain Scale: Hurts a little bit Pain Location: all over Pain Descriptors / Indicators: Moaning Pain Intervention(s): Monitored during session, Limited activity within patient's tolerance    Home Living Family/patient expects to be discharged to:: Private residence  Living Arrangements: Spouse/significant other;Other (Comment) Available Help at Discharge: Family;Available PRN/intermittently Type of Home: House Home Access: Ramped entrance       Home Layout: One level Home Equipment: Agricultural consultant (2 wheels);Cane - single point;Shower seat - built in;Grab bars - tub/shower;Hand held shower head      Prior Function Prior Level of Function : Needs assist;History of Falls (last six months)             Mobility  Comments: Ambulates with RW.  Stating he just completed HH PT ADLs Comments: States Mod I with ADL completion.per OT eval     Extremity/Trunk Assessment   Upper Extremity Assessment Upper Extremity Assessment: Generalized weakness    Lower Extremity Assessment Lower Extremity Assessment: Generalized weakness    Cervical / Trunk Assessment Cervical / Trunk Assessment: Kyphotic  Communication   Communication Communication: Impaired Factors Affecting Communication: Hearing impaired    Cognition Arousal: Alert Behavior During Therapy: WFL for tasks assessed/performed   PT - Cognitive impairments: No apparent impairments                         Following commands: Intact       Cueing Cueing Techniques: Gestural cues (written  communication)     General Comments      Exercises     Assessment/Plan    PT Assessment Patient needs continued PT services  PT Problem List Decreased strength;Decreased activity tolerance;Decreased mobility;Decreased knowledge of precautions;Decreased safety awareness;Decreased balance       PT Treatment Interventions DME instruction;Therapeutic exercise;Gait training;Functional mobility training;Therapeutic activities;Patient/family education    PT Goals (Current goals can be found in the Care Plan section)  Acute Rehab PT Goals Patient Stated Goal: agreed to OOB PT Goal Formulation: With patient Time For Goal Achievement: 03/12/24 Potential to Achieve Goals: Good    Frequency Min 2X/week     Co-evaluation               AM-PAC PT "6 Clicks" Mobility  Outcome Measure Help needed turning from your back to your side while in a flat bed without using bedrails?: A Lot Help needed moving from lying on your back to sitting on the side of a flat bed without using bedrails?: A Lot Help needed moving to and from a bed to a chair (including a wheelchair)?: A Lot Help needed standing up from a chair using your arms (e.g.,  wheelchair or bedside chair)?: A Lot Help needed to walk in hospital room?: Total Help needed climbing 3-5 steps with a railing? : Total 6 Click Score: 10    End of Session Equipment Utilized During Treatment: Gait belt Activity Tolerance: Patient tolerated treatment well Patient left: in chair;with call bell/phone within reach;with chair alarm set Nurse Communication: Mobility status PT Visit Diagnosis: Unsteadiness on feet (R26.81);History of falling (Z91.81);Difficulty in walking, not elsewhere classified (R26.2);Muscle weakness (generalized) (M62.81)    Time: 1610-9604 PT Time Calculation (min) (ACUTE ONLY): 29 min   Charges:   PT Evaluation $PT Eval Low Complexity: 1 Low PT Treatments $Therapeutic Activity: 8-22 mins PT General Charges $$ ACUTE PT VISIT: 1 Visit          Abelina Hoes PT Acute Rehabilitation Services Office [phone]    Dareen Ebbing 02/27/2024, 8:51 AM

## 2024-02-28 DIAGNOSIS — J9601 Acute respiratory failure with hypoxia: Secondary | ICD-10-CM | POA: Diagnosis not present

## 2024-02-28 LAB — GLUCOSE, CAPILLARY
Glucose-Capillary: 168 mg/dL — ABNORMAL HIGH (ref 70–99)
Glucose-Capillary: 284 mg/dL — ABNORMAL HIGH (ref 70–99)
Glucose-Capillary: 276 mg/dL — ABNORMAL HIGH (ref 70–99)
Glucose-Capillary: 236 mg/dL — ABNORMAL HIGH (ref 70–99)

## 2024-02-28 LAB — BASIC METABOLIC PANEL WITH GFR
Anion gap: 10 (ref 5–15)
BUN: 47 mg/dL — ABNORMAL HIGH (ref 8–23)
CO2: 29 mmol/L (ref 22–32)
Calcium: 9.2 mg/dL (ref 8.9–10.3)
Chloride: 103 mmol/L (ref 98–111)
Creatinine, Ser: 1.89 mg/dL — ABNORMAL HIGH (ref 0.61–1.24)
GFR, Estimated: 33 mL/min — ABNORMAL LOW (ref >60)
Glucose, Bld: 193 mg/dL — ABNORMAL HIGH (ref 70–99)
Potassium: 3.9 mmol/L (ref 3.5–5.1)
Sodium: 142 mmol/L (ref 135–145)

## 2024-02-28 LAB — MAGNESIUM: Magnesium: 2.2 mg/dL (ref 1.7–2.4)

## 2024-02-28 LAB — CBC
HCT: 34 % — ABNORMAL LOW (ref 39.0–52.0)
Hemoglobin: 10.1 g/dL — ABNORMAL LOW (ref 13.0–17.0)
MCH: 27.2 pg (ref 26.0–34.0)
MCHC: 29.7 g/dL — ABNORMAL LOW (ref 30.0–36.0)
MCV: 91.6 fL (ref 80.0–100.0)
Platelets: 199 10*3/uL (ref 150–400)
RBC: 3.71 MIL/uL — ABNORMAL LOW (ref 4.22–5.81)
RDW: 19 % — ABNORMAL HIGH (ref 11.5–15.5)
WBC: 9.5 10*3/uL (ref 4.0–10.5)
nRBC: 0 % (ref 0.0–0.2)

## 2024-02-28 LAB — PROCALCITONIN: Procalcitonin: <0.1 ng/mL

## 2024-02-28 LAB — PHOSPHORUS: Phosphorus: 3.4 mg/dL (ref 2.5–4.6)

## 2024-02-28 MED ORDER — POLYVINYL ALCOHOL 1.4 % OP SOLN
2.0000 [drp] | OPHTHALMIC | Status: DC | PRN
  Administered 2024-02-29: 2 [drp] via OPHTHALMIC
  Filled 2024-02-28: qty 15

## 2024-02-28 MED ORDER — INSULIN ASPART 100 UNIT/ML IJ SOLN
2.0000 [IU] | Freq: Once | INTRAMUSCULAR | Status: AC
  Administered 2024-02-28: 2 [IU] via SUBCUTANEOUS

## 2024-02-28 MED ORDER — SODIUM CHLORIDE 0.9 % IV SOLN
2.0000 g | INTRAVENOUS | Status: DC
  Administered 2024-02-28 – 2024-03-04 (×6): 2 g via INTRAVENOUS
  Filled 2024-02-28 (×6): qty 20

## 2024-02-28 MED ORDER — DOXYCYCLINE HYCLATE 100 MG PO TABS
100.0000 mg | ORAL_TABLET | Freq: Two times a day (BID) | ORAL | Status: DC
  Administered 2024-02-28 – 2024-03-04 (×11): 100 mg via ORAL
  Filled 2024-02-28 (×11): qty 1

## 2024-02-28 MED ORDER — GABAPENTIN 300 MG PO CAPS
300.0000 mg | ORAL_CAPSULE | Freq: Two times a day (BID) | ORAL | Status: DC
  Administered 2024-02-28 – 2024-03-04 (×10): 300 mg via ORAL
  Filled 2024-02-28 (×10): qty 1

## 2024-02-28 NOTE — Plan of Care (Signed)

## 2024-02-28 NOTE — Progress Notes (Signed)
 PROGRESS NOTE    [name]  ZOX:[id number] DOB: 1930-04-28 DOA: 02/25/2024 PCP: Gwyndolyn Lerner, PA-C   Brief Narrative: This 89 y.o. male with PMH significant for hypertension, hyperlipidemia, type 2 diabetes, hard of hearing, history of atrial flutter, not on anticoagulation due to recurrent falls, aortic insufficiency, presented in the ED with progressive shortness of breath for last few days. He was hypoxic, SPO2 was 75% on room air in ED, requiring up to 4 L of supplemental oxygen.  X-Rhonda and CT chest shows large left pleural effusion.  IR consulted,  Patient underwent thoracocentesis.  1.4 L of clear fluid drained. Patient feels much improved,  He still remains on 4 L of oxygen. Patient denies any chest pain.  Patient was admitted for further evaluation.   Assessment & Plan:   Principal Problem:   Acute hypoxic respiratory failure (HCC) Active Problems:   AF (paroxysmal atrial fibrillation) (HCC)   Coronary artery disease involving native coronary artery of native heart   Uncontrolled type 2 diabetes mellitus with hyperglycemia, with long-term current use of insulin  (HCC)   Essential hypertension   Hyperlipidemia   Basal cell carcinoma of scalp and skin of neck   Malignant melanoma of head and neck (HCC)  Acute hypoxic respiratory failure : Large left pleural effusion: Patient presented with progressive shortness of breath, hypoxia requiring supplemental oxygen,  Now on 4 L of supplemental oxygen.   Chest x-Jenna showed large left pleural effusion.  BNP found to be 388.5 CT chest shows large left pleural effusion. IR consulted,  Patient underwent ultrasound-guided thoracocentesis 1.4 L of clear fluid drained. Continue supplemental oxygen and wean as tolerated. There were no signs of infection or infiltrate on chest x-Shivam.  Held on antibiotics. Continued on Lasix  40 mg IV daily.  Monitor intake / output charting. Patient started having fever.  Antibiotics started for suspected  pneumonia. Obtain procalcitonin level, chest x-Prathik shows moderate effusion and pulmonary edema.   CAD: Patient denies chest pain, Palpitations. Continue Imdur , Cardizem , aspirin , Lipitor .   Atrial fibrillation: Heart rate controlled, continue Cardizem . Patient is not on anticoagulation due to old age / recurrent falls.   Diabetes mellitus: Hold on p.o. diabetic medications. Continue regular insulin  sliding scale, Carb modified diet.   Essential hypertension: Continue Cardizem , hold on losartan .   Hyperlipidemia: Continue Lipitor  40 mg daily.   History of malignant melanoma of head and neck: Patient does have history of malignant melanoma in the past. This effusion could be related to this.  Obtain CT chest.   Hard of Hearing: Continue supportive care.  Acute kidney injury: Likely due to decreased intake and Lasix . Monitor serum creatinine.  Hold on Lasix  for today.   DVT prophylaxis: SCDs Code Status: DNR Family Communication: Spoke with daughter [name] on phone. Disposition Plan:    Status is: Inpatient Remains inpatient appropriate because: Admitted for acute hypoxic respiratory failure secondary to left large pleural effusion requiring thoracocentesis.  PT and OT recommended SNF.  TOC working on finding a place.    Consultants:  None  Procedures: Thoracocentesis Antimicrobials: None  Subjective: Patient was seen and examined at bedside.  Overnight events noted. Patient reports not feeling well, remains on 4 L of supplemental oxygen. He spiked fever last night.  Objective: Vitals:   02/27/24 2006 02/28/24 0302 02/28/24 0438 02/28/24 0941  BP: (!) 113/49  129/74 96/68  Pulse: 69  67 88  Resp: 18  18   Temp: (!) 100.5 F (38.1 C) (!) 97.5 F (36.4 C)  97.6 F (36.4 C)   TempSrc: Oral Axillary Oral   SpO2: 93%  98%   Weight:      Height:        Intake/Output Summary (Last 24 hours) at 02/28/2024 1151 Last data filed at 02/28/2024 1024 Gross per 24 hour   Intake 600 ml  Output 1225 ml  Net -625 ml   Filed Weights   02/26/24 0521  Weight: 79.5 kg    Examination:  General exam: Appears calm and comfortable, not in any acute distress.  Deconditioned Respiratory system: CTA bilaterally. Respiratory effort normal.  RR 16 Cardiovascular system: S1 & S2 heard, RRR. No JVD, murmurs, rubs, gallops or clicks. Gastrointestinal system: Abdomen is non distended, soft and non tender.  Normal bowel sounds heard. Central nervous system: Alert and oriented X 3. No focal neurological deficits. Extremities: No edema, no cyanosis, no clubbing. Skin: No rashes, lesions or ulcers Psychiatry: Judgement and insight appear normal. Mood & affect appropriate.     Data Reviewed: I have personally reviewed following labs and imaging studies  CBC: Recent Labs  Lab 02/25/24 1206 02/26/24 0545 02/27/24 0528 02/28/24 0733  WBC 7.8 9.8 9.5 9.5  HGB 9.2* 10.3* 9.6* 10.1*  HCT 30.2* 35.3* 31.7* 34.0*  MCV 88.8 92.2 90.8 91.6  PLT 244 259 197 199   Basic Metabolic Panel: Recent Labs  Lab 02/25/24 1206 02/26/24 0715 02/27/24 0528 02/28/24 0733  NA 140 143 142 142  K 4.1 3.8 4.1 3.9  CL 108 105 105 103  CO2 23 29 27 29   GLUCOSE 345* 144* 220* 193*  BUN 33* 31* 45* 47*  CREATININE 1.17 1.41* 1.63* 1.89*  CALCIUM  8.7* 9.3 8.8* 9.2  MG  --  2.2  --  2.2  PHOS  --  4.4  --  3.4   GFR: Estimated Creatinine Clearance: 27.4 mL/min (A) (by C-G formula based on SCr of 1.89 mg/dL (H)). Liver Function Tests: Recent Labs  Lab 02/26/24 0715  AST 14*  ALT 13  ALKPHOS 100  BILITOT 1.2  PROT 6.4*  ALBUMIN 3.0*   No results for input(s): "LIPASE", "AMYLASE" in the last 168 hours. No results for input(s): "AMMONIA" in the last 168 hours. Coagulation Profile: No results for input(s): "INR", "PROTIME" in the last 168 hours. Cardiac Enzymes: No results for input(s): "CKTOTAL", "CKMB", "CKMBINDEX", "TROPONINI" in the last 168 hours. BNP (last 3  results) No results for input(s): "PROBNP" in the last 8760 hours. HbA1C: Recent Labs    02/25/24 1616  HGBA1C 10.5*   CBG: Recent Labs  Lab 02/27/24 0736 02/27/24 1135 02/27/24 1639 02/27/24 2014 02/28/24 0745  GLUCAP 233* 156* 215* 228* 168*   Lipid Profile: No results for input(s): "CHOL", "HDL", "LDLCALC", "TRIG", "CHOLHDL", "LDLDIRECT" in the last 72 hours. Thyroid Function Tests: No results for input(s): "TSH", "T4TOTAL", "FREET4", "T3FREE", "THYROIDAB" in the last 72 hours. Anemia Panel: No results for input(s): "VITAMINB12", "FOLATE", "FERRITIN", "TIBC", "IRON", "RETICCTPCT" in the last 72 hours. Sepsis Labs: No results for input(s): "PROCALCITON", "LATICACIDVEN" in the last 168 hours.  Recent Results (from the past 240 hours)  Acid Fast Smear (AFB)     Status: None   Collection Time: 02/25/24  3:54 PM   Specimen: PATH Cytology Pleural fluid  Result Value Ref Range Status   AFB Specimen Processing Direct Inoculation  Final   Acid Fast Smear Negative  Final    Comment: (NOTE) Performed At: [facility] [address] [place], [place] [id number] Pearlean Botts MD  WU:[id number]    Source (AFB) PLEURAL  Final    Comment: LEFT SIDE Performed at [facility], [address] [place], [place] [address]   Culture, body fluid w Gram Stain-bottle     Status: None (Preliminary result)   Collection Time: 02/25/24  3:54 PM   Specimen: Fluid  Result Value Ref Range Status   Specimen Description FLUID PLEURAL LEFT  Final   Special Requests BOTTLES DRAWN AEROBIC AND ANAEROBIC  Final   Culture   Final    NO GROWTH 3 DAYS Performed at [facility], [address] [place], [place] [address]    Report Status PENDING  Incomplete  Gram stain     Status: None   Collection Time: 02/25/24  3:54 PM   Specimen: Fluid  Result Value Ref Range Status   Specimen Description FLUID PLEURAL LEFT  Final   Special Requests NONE  Final   Gram Stain    Final    WBC PRESENT, PREDOMINANTLY MONONUCLEAR NO ORGANISMS SEEN CYTOSPIN SMEAR Performed at [facility], [address] [place], [place] [address]    Report Status 02/25/2024 FINAL  Final    Radiology Studies: DG CHEST PORT 1 VIEW Result Date: 02/27/2024 CLINICAL DATA:  Acute hypoxic respiratory failure EXAM: PORTABLE CHEST 1 VIEW COMPARISON:  Chest radiograph dated 02/25/2024 FINDINGS: Normal lung volumes. Increased diffuse interstitial opacities and dense left retrocardiac opacity. Increased moderate left pleural effusion. No pneumothorax. Left heart border is obscured. No acute osseous abnormality. IMPRESSION: Findings of increased pulmonary edema and moderate left pleural effusion. Electronically Signed   By: Limin  Xu M.D.   On: 02/27/2024 17:33   Scheduled Meds:  atorvastatin   80 mg Oral QPM   diltiazem   180 mg Oral Daily   docusate sodium   100 mg Oral BID   doxycycline  100 mg Oral Q12H   furosemide   40 mg Intravenous Daily   gabapentin   600 mg Oral TID   insulin  aspart  0-9 Units Subcutaneous TID WC   isosorbide  mononitrate  30 mg Oral Daily   senna  1 tablet Oral BID   traZODone   100 mg Oral QHS   Continuous Infusions:  cefTRIAXone  (ROCEPHIN )  IV 2 g (02/28/24 0950)      LOS: 3 days    Time spent: 35 Mins    Magdalene School, MD [facility]   If 7PM-7AM, please contact night-coverage

## 2024-02-28 NOTE — Plan of Care (Signed)
 No acute events overnight. Problem: Education: Goal: Ability to describe self-care measures that may prevent or decrease complications (Diabetes Survival Skills Education) will improve Outcome: Progressing Goal: Individualized Educational Video(s) Outcome: Progressing   Problem: Coping: Goal: Ability to adjust to condition or change in health will improve Outcome: Progressing   Problem: Fluid Volume: Goal: Ability to maintain a balanced intake and output will improve Outcome: Progressing   Problem: Health Behavior/Discharge Planning: Goal: Ability to identify and utilize available resources and services will improve Outcome: Progressing Goal: Ability to manage health-related needs will improve Outcome: Progressing   Problem: Metabolic: Goal: Ability to maintain appropriate glucose levels will improve Outcome: Progressing   Problem: Nutritional: Goal: Maintenance of adequate nutrition will improve Outcome: Progressing Goal: Progress toward achieving an optimal weight will improve Outcome: Progressing   Problem: Skin Integrity: Goal: Risk for impaired skin integrity will decrease Outcome: Progressing   Problem: Tissue Perfusion: Goal: Adequacy of tissue perfusion will improve Outcome: Progressing   Problem: Education: Goal: Knowledge of General Education information will improve Description: Including pain rating scale, medication(s)/side effects and non-pharmacologic comfort measures Outcome: Progressing   Problem: Health Behavior/Discharge Planning: Goal: Ability to manage health-related needs will improve Outcome: Progressing   Problem: Clinical Measurements: Goal: Ability to maintain clinical measurements within normal limits will improve Outcome: Progressing Goal: Will remain free from infection Outcome: Progressing Goal: Diagnostic test results will improve Outcome: Progressing Goal: Respiratory complications will improve Outcome: Progressing Goal:  Cardiovascular complication will be avoided Outcome: Progressing   Problem: Activity: Goal: Risk for activity intolerance will decrease Outcome: Progressing   Problem: Nutrition: Goal: Adequate nutrition will be maintained Outcome: Progressing   Problem: Coping: Goal: Level of anxiety will decrease Outcome: Progressing   Problem: Elimination: Goal: Will not experience complications related to bowel motility Outcome: Progressing Goal: Will not experience complications related to urinary retention Outcome: Progressing   Problem: Pain Managment: Goal: General experience of comfort will improve and/or be controlled Outcome: Progressing   Problem: Safety: Goal: Ability to remain free from injury will improve Outcome: Progressing   Problem: Skin Integrity: Goal: Risk for impaired skin integrity will decrease Outcome: Progressing

## 2024-02-29 ENCOUNTER — Inpatient Hospital Stay (HOSPITAL_COMMUNITY)

## 2024-02-29 DIAGNOSIS — J9601 Acute respiratory failure with hypoxia: Secondary | ICD-10-CM | POA: Diagnosis not present

## 2024-02-29 LAB — GLUCOSE, CAPILLARY
Glucose-Capillary: 199 mg/dL — ABNORMAL HIGH (ref 70–99)
Glucose-Capillary: 214 mg/dL — ABNORMAL HIGH (ref 70–99)
Glucose-Capillary: 364 mg/dL — ABNORMAL HIGH (ref 70–99)
Glucose-Capillary: 284 mg/dL — ABNORMAL HIGH (ref 70–99)

## 2024-02-29 MED ORDER — INSULIN ASPART 100 UNIT/ML IJ SOLN
2.0000 [IU] | Freq: Every day | INTRAMUSCULAR | Status: DC
  Administered 2024-02-29 – 2024-03-01 (×2): 2 [IU] via SUBCUTANEOUS

## 2024-02-29 MED ORDER — LIDOCAINE HCL 1 % IJ SOLN
INTRAMUSCULAR | Status: AC
  Filled 2024-02-29: qty 20

## 2024-02-29 NOTE — TOC Progression Note (Signed)
 Transition of Care [facility]) - Progression Note    Patient Details  Name: [name] MRN: [id number] Date of Birth: 02-Mar-1930  [facility] of [facility]) CM/SW Contact  Jonni Nettle, LCSW Phone Number: 02/29/2024, 11:46 AM  Clinical Narrative:    CSW spoke with pt's wife, [name] [phone], via phone call to offer SNF bed choice. CSW advised pt's wife of facilities with available beds, location, and Medicare Star-Rating. CSW advised pt's wife that [facility] and [facility] declined pt due to not taking [facility]. Pt's wife reports needing more time before making a decision. Pt's wife reports leaning towards [facility] for SNF bed, however, would still like to discuss it with daughters before making a final decision. CSW left list in pt's room for wife to review. CSW will follow up with pt's wife either this afternoon or tomorrow morning to hear final decision. TOC will continue to follow.  Medicare Star-Ratings  [facility] [address] [place], [place] [address] [phone] Overall rating ??? Below average  [facility] [address] [place], [place] [address] [phone] Overall rating ? Below average  [facility] [address] [place], [place] [address] [phone] Overall rating ?? Much below average   [facility] [address] [place], [place] [address] [phone] Overall rating ?? Much below average  [facility] [address] [place], [place] [address] [phone] Overall rating ?? Much below average  [facility] [address] [place], [place] [address] [phone] Overall rating ???? Above average  [facility] [address] [place], [place] [address] [phone] Overall rating ????   Expected Discharge  Plan: Skilled Nursing Facility Barriers to Discharge: Continued Medical Work up, SNF Pending bed offer  Expected Discharge Plan and Services In-house Referral: Clinical Social Work Discharge Planning Services: NA Post Acute Care Choice: Skilled Nursing Facility Living arrangements for the past 2 months: Single Family Home                 DME Arranged: N/A DME Agency: NA   HH Arranged: NA HH Agency: NA    Social Determinants of Health (SDOH) Interventions SDOH Screenings   Food Insecurity: No Food Insecurity (05/21/2023)  Housing: Low Risk  (05/21/2023)  Transportation Needs: No Transportation Needs (05/21/2023)  Utilities: Not At Risk (05/21/2023)  Tobacco Use: Medium Risk (02/26/2024)    Readmission Risk Interventions    02/26/2024   10:35 AM 02/22/2023    1:30 PM  Readmission Risk Prevention Plan  Transportation Screening Complete Complete  PCP or Specialist Appt within 5-7 Days Complete   PCP or Specialist Appt within 3-5 Days  Complete  Home Care Screening Complete   Medication Review (RN CM) Complete   HRI or Home Care Consult  Complete  Social Work Consult for Recovery Care Planning/Counseling  Complete  Palliative Care Screening  Not Applicable  Medication Review (RN Care Manager)  Complete   [place], MSW, LCSW 02/29/2024 11:52 AM

## 2024-02-29 NOTE — Progress Notes (Incomplete)
 CSW spoke with pt's wife, Khamoni Deutscher, and daughter, Arteen Moraitis, via phone call at 5:06 PM. Per wife, pt and family would like to accept bed offer at [facility] for short-term SN rehab. Pt's wife and daughter requested private room at facility due to pt's hearing loss and not wanting to disturb other residents. CSW spoke with [facility] admissions staff, who report private room is available. CSW will begin insurance authorization tomorrow. TOC will continue to follow.

## 2024-02-29 NOTE — Progress Notes (Signed)
 PROGRESS NOTE    [name]  ZOX:[id number] DOB: [birth year] DOA: 02/25/2024 PCP: Gwyndolyn Lerner, PA-C   Brief Narrative: This 89 y.o. male with PMH significant for hypertension, hyperlipidemia, type 2 diabetes, hard of hearing, history of atrial flutter, not on anticoagulation due to recurrent falls, aortic insufficiency, presented in the ED with progressive shortness of breath for last few days. He was hypoxic, SPO2 was 75% on room air in ED, requiring up to 4 L of supplemental oxygen.  X-[place] and CT chest shows large left pleural effusion.  IR consulted,  Patient underwent thoracocentesis.  1.4 L of clear fluid drained. Patient feels much improved,  He still remains on 4 L of oxygen. Patient denies any chest pain.  Patient was admitted for further evaluation.   Assessment & Plan:   Principal Problem:   Acute hypoxic respiratory failure (HCC) Active Problems:   AF (paroxysmal atrial fibrillation) (HCC)   Coronary artery disease involving native coronary artery of native heart   Uncontrolled type 2 diabetes mellitus with hyperglycemia, with long-term current use of insulin  (HCC)   Essential hypertension   Hyperlipidemia   Basal cell carcinoma of scalp and skin of neck   Malignant melanoma of head and neck (HCC)  Acute hypoxic respiratory failure : Large left pleural effusion: Patient presented with progressive shortness of breath, hypoxia requiring supplemental oxygen,  Now on 4 L of supplemental oxygen.   Chest x-Alvie showed large left pleural effusion.  BNP found to be 388.5 CT chest shows large left pleural effusion. IR consulted,  Patient underwent ultrasound-guided thoracocentesis 1.4 L of clear fluid drained. Continue supplemental oxygen and wean as tolerated. There were no signs of infection or infiltrate on chest x-Ulrich.  Held on antibiotics. Continued on Lasix  40 mg IV daily.  Monitor intake / output charting. Patient started having fever.  Antibiotics started for suspected  pneumonia. Procalcitonin 0.10., chest x-Jacob shows moderate effusion and pulmonary edema. IR consulted for retap.   CAD: Patient denies chest pain, Palpitations. Continue Imdur , Cardizem , aspirin , Lipitor .   Atrial fibrillation: Heart rate controlled, continue Cardizem . Patient is not on anticoagulation due to old age / recurrent falls.   Diabetes mellitus: Hold on p.o. diabetic medications. Continue regular insulin  sliding scale, Carb modified diet.   Essential hypertension: Continue Cardizem , hold on losartan .   Hyperlipidemia: Continue Lipitor  40 mg daily.   History of malignant melanoma of head and neck: Patient does have history of malignant melanoma in the past. This effusion could be related to this.  Obtain CT chest.   Hard of Hearing: Continue supportive care.  Acute kidney injury: Likely due to decreased intake and Lasix . Monitor serum creatinine.  Hold on Lasix  for today.   DVT prophylaxis: SCDs Code Status: DNR Family Communication: Spoke with daughter [name] at bed side. Disposition Plan:    Status is: Inpatient Remains inpatient appropriate because: Admitted for acute hypoxic respiratory failure secondary to left large pleural effusion requiring thoracocentesis.  PT and OT recommended SNF.  TOC working on finding a place.    Consultants:  None  Procedures: Thoracocentesis Antimicrobials: None  Subjective: Patient was seen and examined at bedside.  Overnight events noted. Patient reports not feeling well, remains on 4 L of supplemental oxygen.   He asks about timings for thoracocentesis.  Objective: Vitals:   02/28/24 0941 02/28/24 1752 02/28/24 2013 02/29/24 0416  BP: 96/68 (!) 142/61 (!) 127/51 128/68  Pulse: 88 77 87 91  Resp:  16 18 18   Temp:  97.9  F (36.6 C) 98 F (36.7 C) 97.6 F (36.4 C)  TempSrc:   Oral Oral  SpO2:  93% 99% 95%  Weight:      Height:        Intake/Output Summary (Last 24 hours) at 02/29/2024 1135 Last data filed  at 02/29/2024 0454 Gross per 24 hour  Intake 220 ml  Output 1400 ml  Net -1180 ml   Filed Weights   02/26/24 0521  Weight: 79.5 kg    Examination:  General exam: Appears calm and comfortable, not in any acute distress.  Deconditioned. Respiratory system: Crackles on the left side,  respiratory effort normal.  RR 16 Cardiovascular system: S1 & S2 heard, RRR. No JVD, murmurs, rubs, gallops or clicks. Gastrointestinal system: Abdomen is non distended, soft and non tender.  Normal bowel sounds heard. Central nervous system: Alert and oriented X 3. No focal neurological deficits. Extremities: No edema, no cyanosis, no clubbing. Skin: No rashes, lesions or ulcers Psychiatry: Judgement and insight appear normal. Mood & affect appropriate.     Data Reviewed: I have personally reviewed following labs and imaging studies  CBC: Recent Labs  Lab 02/25/24 1206 02/26/24 0545 02/27/24 0528 02/28/24 0733  WBC 7.8 9.8 9.5 9.5  HGB 9.2* 10.3* 9.6* 10.1*  HCT 30.2* 35.3* 31.7* 34.0*  MCV 88.8 92.2 90.8 91.6  PLT 244 259 197 199   Basic Metabolic Panel: Recent Labs  Lab 02/25/24 1206 02/26/24 0715 02/27/24 0528 02/28/24 0733  NA 140 143 142 142  K 4.1 3.8 4.1 3.9  CL 108 105 105 103  CO2 23 29 27 29   GLUCOSE 345* 144* 220* 193*  BUN 33* 31* 45* 47*  CREATININE 1.17 1.41* 1.63* 1.89*  CALCIUM  8.7* 9.3 8.8* 9.2  MG  --  2.2  --  2.2  PHOS  --  4.4  --  3.4   GFR: Estimated Creatinine Clearance: 27.4 mL/min (A) (by C-G formula based on SCr of 1.89 mg/dL (H)). Liver Function Tests: Recent Labs  Lab 02/26/24 0715  AST 14*  ALT 13  ALKPHOS 100  BILITOT 1.2  PROT 6.4*  ALBUMIN 3.0*   No results for input(s): "LIPASE", "AMYLASE" in the last 168 hours. No results for input(s): "AMMONIA" in the last 168 hours. Coagulation Profile: No results for input(s): "INR", "PROTIME" in the last 168 hours. Cardiac Enzymes: No results for input(s): "CKTOTAL", "CKMB", "CKMBINDEX",  "TROPONINI" in the last 168 hours. BNP (last 3 results) No results for input(s): "PROBNP" in the last 8760 hours. HbA1C: No results for input(s): "HGBA1C" in the last 72 hours.  CBG: Recent Labs  Lab 02/28/24 0745 02/28/24 1231 02/28/24 1727 02/28/24 2057 02/29/24 0740  GLUCAP 168* 284* 276* 236* 199*   Lipid Profile: No results for input(s): "CHOL", "HDL", "LDLCALC", "TRIG", "CHOLHDL", "LDLDIRECT" in the last 72 hours. Thyroid Function Tests: No results for input(s): "TSH", "T4TOTAL", "FREET4", "T3FREE", "THYROIDAB" in the last 72 hours. Anemia Panel: No results for input(s): "VITAMINB12", "FOLATE", "FERRITIN", "TIBC", "IRON", "RETICCTPCT" in the last 72 hours. Sepsis Labs: Recent Labs  Lab 02/28/24 1211  PROCALCITON <0.10    Recent Results (from the past 240 hours)  Fungus Culture With Stain     Status: None (Preliminary result)   Collection Time: 02/25/24  3:54 PM   Specimen: PATH Cytology Pleural fluid  Result Value Ref Range Status   Fungus Stain Final report  Final    Comment: (NOTE) Performed At: [facility] [address] [place], [place] [id number] Madalyn Scarce  Laymond Priestly MD ZO:[id number]    Fungus (Mycology) Culture PENDING  Incomplete   Fungal Source PLEURAL  Final    Comment: LEFT SIDE Performed at [facility], [address] [place], [place] [address]   Acid Fast Smear (AFB)     Status: None   Collection Time: 02/25/24  3:54 PM   Specimen: PATH Cytology Pleural fluid  Result Value Ref Range Status   AFB Specimen Processing Direct Inoculation  Final   Acid Fast Smear Negative  Final    Comment: (NOTE) Performed At: [facility] [address] [place], [place] [id number] Pearlean Botts MD HY:[id number]    Source (AFB) PLEURAL  Final    Comment: LEFT SIDE Performed at [facility], [address], [place], [place] [address]   Culture, body fluid w Gram Stain-bottle     Status: None (Preliminary  result)   Collection Time: 02/25/24  3:54 PM   Specimen: Fluid  Result Value Ref Range Status   Specimen Description FLUID PLEURAL LEFT  Final   Special Requests BOTTLES DRAWN AEROBIC AND ANAEROBIC  Final   Culture   Final    NO GROWTH 4 DAYS Performed at [facility], [address], [place], [place] [address]    Report Status PENDING  Incomplete  Gram stain     Status: None   Collection Time: 02/25/24  3:54 PM   Specimen: Fluid  Result Value Ref Range Status   Specimen Description FLUID PLEURAL LEFT  Final   Special Requests NONE  Final   Gram Stain   Final    WBC PRESENT, PREDOMINANTLY MONONUCLEAR NO ORGANISMS SEEN CYTOSPIN SMEAR Performed at [facility], [address], [place], [place] [address]    Report Status 02/25/2024 FINAL  Final  Fungus Culture Result     Status: None   Collection Time: 02/25/24  3:54 PM  Result Value Ref Range Status   Result 1 Comment  Final    Comment: (NOTE) KOH/Calcofluor preparation:  no fungus observed. Performed At: [facility] [address] [place], [place] [id number] Pearlean Botts MD KV:[id number]     Radiology Studies: DG CHEST PORT 1 VIEW Result Date: 02/27/2024 CLINICAL DATA:  Acute hypoxic respiratory failure EXAM: PORTABLE CHEST 1 VIEW COMPARISON:  Chest radiograph dated 02/25/2024 FINDINGS: Normal lung volumes. Increased diffuse interstitial opacities and dense left retrocardiac opacity. Increased moderate left pleural effusion. No pneumothorax. Left heart border is obscured. No acute osseous abnormality. IMPRESSION: Findings of increased pulmonary edema and moderate left pleural effusion. Electronically Signed   By: Limin  Xu M.D.   On: 02/27/2024 17:33   Scheduled Meds:  atorvastatin   80 mg Oral QPM   diltiazem   180 mg Oral Daily   docusate sodium   100 mg Oral BID   doxycycline  100 mg Oral Q12H   gabapentin   300 mg Oral BID   insulin  aspart  0-9 Units Subcutaneous TID WC   isosorbide  mononitrate  30  mg Oral Daily   senna  1 tablet Oral BID   traZODone   100 mg Oral QHS   Continuous Infusions:  cefTRIAXone  (ROCEPHIN )  IV 2 g (02/29/24 0834)      LOS: 4 days    Time spent: 35 Mins    Magdalene School, MD [facility]   If 7PM-7AM, please contact night-coverage

## 2024-02-29 NOTE — Procedures (Signed)
 PROCEDURE SUMMARY:  Successful image-guided therapeutic left-sided thoracentesis. Yielded 1.8 liters of clear, amber-colored pleural fluid. Patient tolerated procedure well. EBL: Zero No immediate complications.  Post procedure CXR is pending.  Please see imaging section of Epic for full dictation.  Gordy Lauber Corryn Madewell PA-C 02/29/2024 2:42 PM

## 2024-02-29 NOTE — Plan of Care (Signed)

## 2024-03-01 ENCOUNTER — Other Ambulatory Visit (HOSPITAL_COMMUNITY): Payer: Self-pay

## 2024-03-01 ENCOUNTER — Encounter (HOSPITAL_COMMUNITY): Payer: Self-pay | Admitting: Family Medicine

## 2024-03-01 DIAGNOSIS — I10 Essential (primary) hypertension: Secondary | ICD-10-CM

## 2024-03-01 DIAGNOSIS — J9601 Acute respiratory failure with hypoxia: Secondary | ICD-10-CM | POA: Diagnosis not present

## 2024-03-01 DIAGNOSIS — E785 Hyperlipidemia, unspecified: Secondary | ICD-10-CM

## 2024-03-01 DIAGNOSIS — J90 Pleural effusion, not elsewhere classified: Principal | ICD-10-CM

## 2024-03-01 DIAGNOSIS — I4891 Unspecified atrial fibrillation: Secondary | ICD-10-CM

## 2024-03-01 DIAGNOSIS — N179 Acute kidney failure, unspecified: Secondary | ICD-10-CM

## 2024-03-01 LAB — PHOSPHORUS: Phosphorus: 2.8 mg/dL (ref 2.5–4.6)

## 2024-03-01 LAB — BASIC METABOLIC PANEL WITH GFR
Anion gap: 9 (ref 5–15)
BUN: 50 mg/dL — ABNORMAL HIGH (ref 8–23)
CO2: 30 mmol/L (ref 22–32)
Calcium: 8.8 mg/dL — ABNORMAL LOW (ref 8.9–10.3)
Chloride: 98 mmol/L (ref 98–111)
Creatinine, Ser: 1.35 mg/dL — ABNORMAL HIGH (ref 0.61–1.24)
GFR, Estimated: 49 mL/min — ABNORMAL LOW (ref >60)
Glucose, Bld: 219 mg/dL — ABNORMAL HIGH (ref 70–99)
Potassium: 3.6 mmol/L (ref 3.5–5.1)
Sodium: 137 mmol/L (ref 135–145)

## 2024-03-01 LAB — CBC
HCT: 30.4 % — ABNORMAL LOW (ref 39.0–52.0)
Hemoglobin: 9.4 g/dL — ABNORMAL LOW (ref 13.0–17.0)
MCH: 27.3 pg (ref 26.0–34.0)
MCHC: 30.9 g/dL (ref 30.0–36.0)
MCV: 88.4 fL (ref 80.0–100.0)
Platelets: 200 10*3/uL (ref 150–400)
RBC: 3.44 MIL/uL — ABNORMAL LOW (ref 4.22–5.81)
RDW: 18.5 % — ABNORMAL HIGH (ref 11.5–15.5)
WBC: 8.6 10*3/uL (ref 4.0–10.5)
nRBC: 0 % (ref 0.0–0.2)

## 2024-03-01 LAB — GLUCOSE, CAPILLARY
Glucose-Capillary: 227 mg/dL — ABNORMAL HIGH (ref 70–99)
Glucose-Capillary: 226 mg/dL — ABNORMAL HIGH (ref 70–99)
Glucose-Capillary: 289 mg/dL — ABNORMAL HIGH (ref 70–99)
Glucose-Capillary: 229 mg/dL — ABNORMAL HIGH (ref 70–99)

## 2024-03-01 LAB — CULTURE, BODY FLUID W GRAM STAIN -BOTTLE: Culture: NO GROWTH

## 2024-03-01 LAB — MAGNESIUM: Magnesium: 2 mg/dL (ref 1.7–2.4)

## 2024-03-01 MED ORDER — INSULIN GLARGINE-YFGN 100 UNIT/ML ~~LOC~~ SOPN: 4.0000 [IU] | PEN_INJECTOR | SUBCUTANEOUS | Status: DC

## 2024-03-01 MED ORDER — MELATONIN 3 MG PO TABS
3.0000 mg | ORAL_TABLET | Freq: Once | ORAL | Status: AC
  Administered 2024-03-02: 3 mg via ORAL
  Filled 2024-03-01: qty 1

## 2024-03-01 MED ORDER — INSULIN GLARGINE-YFGN 100 UNIT/ML ~~LOC~~ SOLN
4.0000 [IU] | Freq: Every day | SUBCUTANEOUS | Status: DC
  Administered 2024-03-01 – 2024-03-03 (×3): 4 [IU] via SUBCUTANEOUS
  Filled 2024-03-01 (×3): qty 0.04

## 2024-03-01 NOTE — Progress Notes (Signed)
 Occupational Therapy Treatment Patient Details Name: [name] MRN: [id number] DOB: May 26, 1930 Today's Date: 03/01/2024   History of present illness 89 y.o. male with PMH significant for hypertension, hyperlipidemia, type 2 diabetes, hard of hearing, history of atrial flutter, not on anticoagulation due to recurrent falls, aortic insufficiency, presented 5/7 with progressive shortness of breath for last few days.  He was hypoxic, requiring up to 4 L.   OT comments  Patient seen for skilled OT session with focus on inproving overall activity tolerance and generalized strength for ADL's and mobility. Patient tolerated activity outlined below and 8 sets of 2 minutes of targeted UE theract with cues for breathing integration and pacing. HOH limits conversational instruction but with written and gestural cues, patient able to teach back. OT will continue to follow acutely to progress function. Patient will benefit from continued inpatient follow up therapy, <3 hours/day.        If plan is discharge home, recommend the following:  Assist for transportation;A lot of help with bathing/dressing/bathroom;A little help with walking and/or transfers;Assistance with cooking/housework   Equipment Recommendations  BSC/3in1       Precautions / Restrictions Precautions Precautions: Fall Precaution/Restrictions Comments: Watch O2 Restrictions Weight Bearing Restrictions Per Provider Order: No       Mobility Bed Mobility Overal bed mobility: Needs Assistance Bed Mobility: Supine to Sit, Sit to Supine     Supine to sit: Contact guard, HOB elevated, Used rails          Transfers Overall transfer level:  (NT just transferred patient back to bed from recliner from the am, agreeable to EOB and bed level activity)                       Balance Overall balance assessment: Needs assistance, History of Falls Sitting-balance support: Feet supported Sitting balance-Leahy Scale: Fair                                      ADL either performed or assessed with clinical judgement   ADL Overall ADL's : Needs assistance/impaired Eating/Feeding: Set up   Grooming: Wash/dry hands;Wash/dry face;Oral care;Sitting                                 General ADL Comments: SpO2 remained >92% on RA    Extremity/Trunk Assessment Upper Extremity Assessment Upper Extremity Assessment: Generalized weakness   Lower Extremity Assessment Lower Extremity Assessment: Generalized weakness                 Communication Communication Communication: Impaired Factors Affecting Communication: Hearing impaired   Cognition Arousal: Alert Behavior During Therapy: WFL for tasks assessed/performed Cognition: No apparent impairments                               Following commands: Intact        Cueing   Cueing Techniques: Gestural cues        General Comments B eyes reddended releived with warm ccompress    Pertinent Vitals/ Pain       Pain Assessment Pain Assessment: Faces Faces Pain Scale: No hurt   Frequency  Min 2X/week        Progress Toward Goals  OT Goals(current goals can now be found in the care plan  section)  Progress towards OT goals: Progressing toward goals  Acute Rehab OT Goals Patient Stated Goal: to go to therapy tomorrow OT Goal Formulation: With patient Time For Goal Achievement: 03/11/24 Potential to Achieve Goals: Fair ADL Goals Pt Will Perform Grooming: with modified independence;standing Pt Will Perform Lower Body Dressing: with modified independence;sit to/from stand;sitting/lateral leans Pt Will Transfer to Toilet: with modified independence;regular height toilet;ambulating  Plan         AM-PAC OT "6 Clicks" Daily Activity     Outcome Measure   Help from another person eating meals?: None Help from another person taking care of personal grooming?: A Little Help from another person toileting, which  includes using toliet, bedpan, or urinal?: A Lot Help from another person bathing (including washing, rinsing, drying)?: A Lot Help from another person to put on and taking off regular upper body clothing?: A Little Help from another person to put on and taking off regular lower body clothing?: A Lot 6 Click Score: 16    End of Session Equipment Utilized During Treatment: Gait belt  OT Visit Diagnosis: Unsteadiness on feet (R26.81);Muscle weakness (generalized) (M62.81);History of falling (Z91.81)   Activity Tolerance Patient limited by fatigue   Patient Left in bed;with call bell/phone within reach;with bed alarm set   Nurse Communication Mobility status        Time: 4098-1191 OT Time Calculation (min): 21 min  Charges: OT General Charges $OT Visit: 1 Visit OT Treatments $Therapeutic Activity: 8-22 mins  Davina Howlett OT/L Acute Rehabilitation Department  [phone]  03/01/2024, 12:20 PM

## 2024-03-01 NOTE — TOC Progression Note (Signed)
 Transition of Care [facility]) - Progression Note    Patient Details  Name: [name] MRN: [id number] Date of Birth: Jul 02, 1930  Transition of Care [facility]) CM/SW Contact  Amaryllis Junior, [place] Phone Number: 03/01/2024, 1:30 PM  Clinical Narrative:    Ins auth started; pending approval. Per MD, pt should be ready for dc once cleared by pulmonology.    Expected Discharge Plan: Skilled Nursing Facility Barriers to Discharge: Continued Medical Work up, SNF Pending bed offer  Expected Discharge Plan and Services In-house Referral: Clinical Social Work Discharge Planning Services: NA Post Acute Care Choice: Skilled Nursing Facility Living arrangements for the past 2 months: Single Family Home                 DME Arranged: N/A DME Agency: NA       HH Arranged: NA HH Agency: NA         Social Determinants of Health (SDOH) Interventions SDOH Screenings   Food Insecurity: No Food Insecurity (05/21/2023)  Housing: Low Risk  (05/21/2023)  Transportation Needs: No Transportation Needs (05/21/2023)  Utilities: Not At Risk (05/21/2023)  Tobacco Use: Medium Risk (02/26/2024)    Readmission Risk Interventions    02/26/2024   10:35 AM 02/22/2023    1:30 PM  Readmission Risk Prevention Plan  Transportation Screening Complete Complete  PCP or Specialist Appt within 5-7 Days Complete   PCP or Specialist Appt within 3-5 Days  Complete  Home Care Screening Complete   Medication Review (RN CM) Complete   HRI or Home Care Consult  Complete  Social Work Consult for Recovery Care Planning/Counseling  Complete  Palliative Care Screening  Not Applicable  Medication Review Oceanographer)  Complete

## 2024-03-01 NOTE — TOC Progression Note (Signed)
 Transition of Care [facility]) - Progression Note    Patient Details  Name: Clearnce Camisa MRN: [id number] Date of Birth: [birth year]  Transition of Care [facility]) CM/SW Contact  Jonni Nettle, LCSW Phone Number: 03/01/2024, 8:22 AM  Clinical Narrative:    CSW spoke with pt's wife, Boots Valera, and daughter, Murat Deibel, via phone call at 5:06 PM. Per wife, pt and family would like to accept bed offer at [facility] for short-term SN rehab. Pt's wife and daughter requested private room at facility due to pt's hearing loss and not wanting to disturb other residents. CSW spoke with [facility] admissions staff, who report private room is available. CSW will begin insurance authorization tomorrow. TOC will continue to follow.  Expected Discharge Plan: Skilled Nursing Facility Barriers to Discharge: Continued Medical Work up, SNF Pending bed offer  Expected Discharge Plan and Services In-house Referral: Clinical Social Work Discharge Planning Services: NA Post Acute Care Choice: Skilled Nursing Facility Living arrangements for the past 2 months: Single Family Home                 DME Arranged: N/A DME Agency: NA   HH Arranged: NA HH Agency: NA    Social Determinants of Health (SDOH) Interventions SDOH Screenings   Food Insecurity: No Food Insecurity (05/21/2023)  Housing: Low Risk  (05/21/2023)  Transportation Needs: No Transportation Needs (05/21/2023)  Utilities: Not At Risk (05/21/2023)  Tobacco Use: Medium Risk (02/26/2024)    Readmission Risk Interventions    02/26/2024   10:35 AM 02/22/2023    1:30 PM  Readmission Risk Prevention Plan  Transportation Screening Complete Complete  PCP or Specialist Appt within 5-7 Days Complete   PCP or Specialist Appt within 3-5 Days  Complete  Home Care Screening Complete   Medication Review (RN CM) Complete   HRI or Home Care Consult  Complete  Social Work Consult for Recovery Care Planning/Counseling  Complete  Palliative Care Screening  Not  Applicable  Medication Review (RN Care Manager)  Complete   [place], MSW, LCSW 03/01/2024 8:22 AM

## 2024-03-01 NOTE — Progress Notes (Signed)
 Physical Therapy Treatment Patient Details Name: [name] MRN: [id number] DOB: May 28, 1930 Today's Date: 03/01/2024   History of Present Illness 89 y.o. male with PMH significant for hypertension, hyperlipidemia, type 2 diabetes, hard of hearing, history of atrial flutter, not on anticoagulation due to recurrent falls, aortic insufficiency, presented 5/7 with progressive shortness of breath for last few days.  He was hypoxic, requiring up to 4 L.    PT Comments  Patient progressing with mobility able to ambulate in hallway and noted SpO2 on RA 86-93% with no signs of distress.  Patient with slow shuffling gait and poor postural support at risk for falls at home.  Continue to recommend inpatient rehab (<3 hours/day) prior to d/c home.     If plan is discharge home, recommend the following: A little help with walking and/or transfers;A little help with bathing/dressing/bathroom;Assistance with cooking/housework;Help with stairs or ramp for entrance;Assist for transportation   Can travel by private vehicle     Yes  Equipment Recommendations  None recommended by PT    Recommendations for Other Services       Precautions / Restrictions Precautions Precautions: Fall Precaution/Restrictions Comments: Watch O2     Mobility  Bed Mobility Overal bed mobility: Needs Assistance Bed Mobility: Supine to Sit     Supine to sit: Min assist     General bed mobility comments: reaching for hand hold to lift trunk    Transfers Overall transfer level: Needs assistance Equipment used: Rolling walker (2 wheels) Transfers: Sit to/from Stand Sit to Stand: Min assist           General transfer comment: some lifting help to stand    Ambulation/Gait Ambulation/Gait assistance: Contact guard assist Gait Distance (Feet): 100 Feet Assistive device: Rolling walker (2 wheels) Gait Pattern/deviations: Decreased stride length, Shuffle, Trunk flexed, Step-through pattern       General Gait  Details: wearing shoes, though dragging his feet with ambulation and poor postural support with flexed head and neck and rounded shoulders   Stairs             Wheelchair Mobility     Tilt Bed    Modified Rankin (Stroke Patients Only)       Balance Overall balance assessment: Needs assistance Sitting-balance support: Feet supported Sitting balance-Leahy Scale: Good     Standing balance support: Bilateral upper extremity supported, Reliant on assistive device for balance Standing balance-Leahy Scale: Poor                              Communication Communication Communication: Impaired Factors Affecting Communication: Hearing impaired  Cognition Arousal: Alert Behavior During Therapy: WFL for tasks assessed/performed   PT - Cognitive impairments: No apparent impairments                         Following commands: Intact      Cueing    Exercises General Exercises - Lower Extremity Long Arc Quad: Strengthening, Both, 15 reps, Seated Hip Flexion/Marching: Strengthening, Both, 10 reps, Seated Other Exercises Other Exercises: trunk flexion/extension x 10 arms crossed over chest    General Comments General comments (skin integrity, edema, etc.): Assist to don shoes at the EOB      Pertinent Vitals/Pain Pain Assessment Pain Assessment: No/denies pain    Home Living  Prior Function            PT Goals (current goals can now be found in the care plan section) Progress towards PT goals: Progressing toward goals    Frequency    Min 2X/week      PT Plan      Co-evaluation              AM-PAC PT "6 Clicks" Mobility   Outcome Measure  Help needed turning from your back to your side while in a flat bed without using bedrails?: A Little Help needed moving from lying on your back to sitting on the side of a flat bed without using bedrails?: A Little Help needed moving to and from a bed to  a chair (including a wheelchair)?: A Little Help needed standing up from a chair using your arms (e.g., wheelchair or bedside chair)?: A Little Help needed to walk in hospital room?: A Little Help needed climbing 3-5 steps with a railing? : Total 6 Click Score: 16    End of Session Equipment Utilized During Treatment: Gait belt Activity Tolerance: Patient tolerated treatment well Patient left: with call bell/phone within reach;in chair;with chair alarm set   PT Visit Diagnosis: History of falling (Z91.81);Muscle weakness (generalized) (M62.81);Other abnormalities of gait and mobility (R26.89)     Time: 1610-9604 PT Time Calculation (min) (ACUTE ONLY): 27 min  Charges:    $Gait Training: 8-22 mins $Therapeutic Exercise: 8-22 mins PT General Charges $$ ACUTE PT VISIT: 1 Visit                     Abigail Hoff, PT Acute Rehabilitation Services Office:[phone] 03/01/2024    [name] 03/01/2024, 4:49 PM

## 2024-03-01 NOTE — Consult Note (Signed)
 NAME:  [name], MRN:  [id number], DOB:  [birth year], LOS: 5 ADMISSION DATE:  02/25/2024, CONSULTATION DATE: 5/12 REFERRING MD:  Elsworth Halt, CHIEF COMPLAINT: Recurrent pleural effusion  History of Present Illness:  89 year old male patient with extensive history as mentioned below admitted initially on 5/12 with several day h/o progressive shortness of breath. On ER arrival sats 75% room air. CT chest showed bilateral airspace disease and large left effusion.  He was placed on supplemental oxygen, and IR was consulted for left thoracentesis initially performed on 5/7 at that time 1.6 L was removed, fluid was yellow appearing, cultures and cytology negative. Unfortunately LDH not sent.  Initially had felt better.  Oxygen was weaned.  In She was treated with ongoing IV diuresis.  On 5/11 continued to have supplemental oxygen needs at 4 L a repeat chest x-Glyn was obtained 2 days prior which showed mixed picture of both pulmonary edema and pleural effusions so she was again sent for thoracocentesis this time drained on 5/12 yielding 1.8 L of fluid unfortunately no diagnostics were sent from the sample  The patient is now ready for discharge and pulmonary has been asked to evaluate to comment on recurrent pleural effusion  Pertinent  Medical History  Hypertension hyperlipidemia type 2 diabetes hearing difficulty history of atrial flutter not on anticoagulation recurrent falls, aortic insufficiency  Significant Hospital Events: Including procedures, antibiotic start and stop dates in addition to other pertinent events   5/7 admitted with hypoxic respiratory failure, pulmonary edema and left pleural effusion underwent large-volume left pleural effusion fluid nondiagnostic, was negative for culture and cytology but inflammatory indices not completely sent 5/7 through 5/11 still hypoxic requiring supplemental oxygen chest x-Jesselee postthoracentesis performed earlier in hospitalization continued to show evidence of  edema and left effusion and therefore repeat ultrasound-guided thoracentesis performed no sample sent drained 1.8 L   Interim History / Subjective:  Reports he is breathing much easier  Objective    Blood pressure 124/61, pulse 74, temperature 98 F (36.7 C), resp. rate 17, height 6' (1.829 m), weight 79.5 kg, SpO2 90%.        Intake/Output Summary (Last 24 hours) at 03/01/2024 1326 Last data filed at 03/01/2024 0900 Gross per 24 hour  Intake 240 ml  Output 800 ml  Net -560 ml   Filed Weights   02/26/24 0521  Weight: 79.5 kg    Examination: General: 89 year old male patient lying in bed no acute distress on room air HENT: Very hard of hearing mucous membranes moist no JVD Lungs: Clear to auscultation diminished bases portable chest x-Jobani with minimal residual left effusion on personal review from film obtained on 5/11 Cardiovascular: Regular irregular, with heart murmur consistent with AAS, notable radiation to the carotids Abdomen: Soft not tender Extremities: Warm dry Neuro: Awake and oriented x 3, very slow to respond, most deficits secondary to hearing loss moves all extremities GU: Voids  Resolved Hospital Problem list     Acute hypoxic respiratory failure Pulmonary edema  Assessment & Plan:   Recurrent left pleural effusion with volume overload and pulmonary edema Coronary artery disease Atrial fibrillation Aortic insufficiency Diabetes Hypertension Hyperlipidemia AKI Hearing deficit Remote basal cell carcinoma of the scalp and neck   Pulmonary problem list: Recurrent left pleural effusion with volume overload and pulmonary edema - The effusion is most likely transudate, however unfortunately unable to be quantified given inefficient lab data.  Plan Continue diuretics as tolerated Daily weights Maximize cardiac function If pleural effusion reoccurs please send complete  pleural analysis including pleural LDH and protein in addition to cultures cell  count and glucose   We will sign off okay for transfer to skilled nursing facility    Best Practice (right click and "Reselect all SmartList Selections" daily)    Labs   CBC: Recent Labs  Lab 02/25/24 1206 02/26/24 0545 02/27/24 0528 02/28/24 0733 03/01/24 0517  WBC 7.8 9.8 9.5 9.5 8.6  HGB 9.2* 10.3* 9.6* 10.1* 9.4*  HCT 30.2* 35.3* 31.7* 34.0* 30.4*  MCV 88.8 92.2 90.8 91.6 88.4  PLT 244 259 197 199 200    Basic Metabolic Panel: Recent Labs  Lab 02/25/24 1206 02/26/24 0715 02/27/24 0528 02/28/24 0733 03/01/24 0517  Robert 140 143 142 142 137  K 4.1 3.8 4.1 3.9 3.6  CL 108 105 105 103 98  CO2 23 29 27 29 30   GLUCOSE 345* 144* 220* 193* 219*  BUN 33* 31* 45* 47* 50*  CREATININE 1.17 1.41* 1.63* 1.89* 1.35*  CALCIUM  8.7* 9.3 8.8* 9.2 8.8*  MG  --  2.2  --  2.2 2.0  PHOS  --  4.4  --  3.4 2.8   GFR: Estimated Creatinine Clearance: 38.3 mL/min (A) (by C-G formula based on SCr of 1.35 mg/dL (H)). Recent Labs  Lab 02/26/24 0545 02/27/24 0528 02/28/24 0733 02/28/24 1211 03/01/24 0517  PROCALCITON  --   --   --  <0.10  --   WBC 9.8 9.5 9.5  --  8.6    Liver Function Tests: Recent Labs  Lab 02/26/24 0715  AST 14*  ALT 13  ALKPHOS 100  BILITOT 1.2  PROT 6.4*  ALBUMIN 3.0*   No results for input(s): "LIPASE", "AMYLASE" in the last 168 hours. No results for input(s): "AMMONIA" in the last 168 hours.  ABG    Component Value Date/Time   HCO3 26.5 02/21/2023 1340   O2SAT 59.8 02/21/2023 1340     Coagulation Profile: No results for input(s): "INR", "PROTIME" in the last 168 hours.  Cardiac Enzymes: No results for input(s): "CKTOTAL", "CKMB", "CKMBINDEX", "TROPONINI" in the last 168 hours.  HbA1C: Hgb A1c MFr Bld  Date/Time Value Ref Range Status  02/25/2024 04:16 PM 10.5 (H) 4.8 - 5.6 % Final    Comment:    (NOTE) Pre diabetes:          5.7%-6.4%  Diabetes:              >6.4%  Glycemic control for   <7.0% adults with diabetes   01/21/2022  01:52 AM 12.8 (H) 4.8 - 5.6 % Final    Comment:    (NOTE)         Prediabetes: 5.7 - 6.4         Diabetes: >6.4         Glycemic control for adults with diabetes: <7.0     CBG: Recent Labs  Lab 02/29/24 1246 02/29/24 1734 02/29/24 2030 03/01/24 0739 03/01/24 1201  GLUCAP 214* 364* 284* 227* 226*    Review of Systems:   Not able due to hearing def   Past Medical History:  He,  has a past medical history of Anemia, Aortic insufficiency, Arthritis, Atrial flutter (HCC), Breathing difficulty (11-10-12), Carotid artery disease (HCC), Change in hearing, Diabetes mellitus (age 53), Diabetic peripheral neuropathy associated with type 2 diabetes mellitus (HCC) (07/08/2022), Dysrhythmia (07/2012), H/O hiatal hernia, HOH (hard of hearing) (11-10-12), Hyperlipidemia, Hypertension, and Ulcer (1995).   Surgical History:   Past Surgical History:  Procedure Laterality Date  CAROTID DUPLEX  06/30/2012   PATENT RIGHT CAROTID ENDARTERECTOMY. 1%-39% LEFT ICA. STABLE   CAROTID ENDARTERECTOMY  11/242008   right with Dacron patch angioplasty   CATARACT EXTRACTION  1998 bilateral  done 3 months apart   CHOLECYSTECTOMY N/A 06/11/2019   Procedure: LAPAROSCOPIC CHOLECYSTECTOMY;  Surgeon: Sim Dryer, MD;  Location: [facility] OR;  Service: General;  Laterality: N/A;   ESOPHAGOGASTRODUODENOSCOPY (EGD) WITH PROPOFOL  N/A 06/08/2019   Procedure: ESOPHAGOGASTRODUODENOSCOPY (EGD) WITH PROPOFOL ;  Surgeon: Baldo Bonds, MD;  Location: [facility] ENDOSCOPY;  Service: Endoscopy;  Laterality: N/A;   ESOPHAGOGASTRODUODENOSCOPY (EGD) WITH PROPOFOL  N/A 05/22/2023   Procedure: ESOPHAGOGASTRODUODENOSCOPY (EGD) WITH PROPOFOL ;  Surgeon: Genell Ken, MD;  Location: [facility] ENDOSCOPY;  Service: Gastroenterology;  Laterality: N/A;   EYE SURGERY     HEMOSTASIS CLIP PLACEMENT  05/22/2023   Procedure: HEMOSTASIS CLIP PLACEMENT;  Surgeon: Genell Ken, MD;  Location: [facility] ENDOSCOPY;  Service: Gastroenterology;;   JOINT REPLACEMENT Right 11-18-12    KNEE BURSECTOMY  11/18/2012   Procedure: KNEE BURSECTOMY;  Surgeon: Florencia Hunter, MD;  Location: [facility] ORS;  Service: Orthopedics;  Laterality: Right;   NM MYOCAR MULTIPLE W/SPECT  10/08/2012   NORMAL STRESS NUCLEAR STUDY.   POLYPECTOMY  05/22/2023   Procedure: POLYPECTOMY;  Surgeon: Genell Ken, MD;  Location: [facility];  Service: Gastroenterology;;   TOTAL KNEE ARTHROPLASTY  11/18/2012   Procedure: TOTAL KNEE ARTHROPLASTY;  Surgeon: Florencia Hunter, MD;  Location: [facility] ORS;  Service: Orthopedics;  Laterality: Right;   TRANSTHORACIC ECHOCARDIOGRAM  10/08/2012   MODERATE LVH. MODERATE CONCENTRIC HYPERTROPHY.EF 65 TO 70%. GRADE 1 DYSTOLIC DYSFUNCTION. ELEVATED VENTRICULAR END-DIASTOLIC FILLING PRESSURE AND LEFT ATRIAL FILLING PRESSURE. AV- MILD TO MODERATE CALCIFIED ANNULUS. MV- CALCIFIC DEGENERATION.Aaron Aas LA-MILDLY DILATED.     Social History:   reports that he quit smoking about 47 years ago. His smoking use included cigarettes. He has been exposed to tobacco smoke. He has never used smokeless tobacco. He reports current alcohol use of about 1.0 standard drink of alcohol per week. He reports that he does not use drugs.   Family History:  His family history includes Cancer in his brother, daughter, and mother; Heart disease in his mother.   Allergies No Known Allergies   Home Medications  Prior to Admission medications   Medication Sig Start Date End Date Taking? Authorizing Provider  acetaminophen  (TYLENOL ) 500 MG tablet Take 500 mg by mouth every 6 (six) hours as needed for moderate pain (for pain).   Yes [provider]  aspirin  81 MG chewable tablet Chew 81 mg by mouth daily.   Yes [provider]  atorvastatin  (LIPITOR ) 80 MG tablet Take 80 mg by mouth every evening.    Yes [provider]  Calcium  Carb-Cholecalciferol (CALCIUM  + D3 PO) Take 1 tablet by mouth in the morning and at bedtime.   Yes [provider]  cycloSPORINE  (RESTASIS ) 0.05 % ophthalmic  emulsion Place 1 drop into both eyes 2 (two) times daily.   Yes [provider]  Dextrose , Diabetic Use, (GLUCOSE PO) Take 1 tablet by mouth daily as needed (Low blood sugar).   Yes [provider]  diltiazem  (CARDIZEM  LA) 180 MG 24 hr tablet Take 180 mg by mouth daily. 04/29/22  Yes [provider]  furosemide  (LASIX ) 20 MG tablet Take 1 tablet (20 mg total) by mouth as needed (Weigh yourself daily.  If weight increases by more than 2 pounds from day before or 5 pounds compared to prior week take one dose.). Patient taking differently:  Take 20 mg by mouth as needed (after weighing daily- if weight increases by more than 2 pounds from day before or 5 pounds compared to the prior week). 02/22/23 02/25/24 Yes Shalhoub, Merrill Abide, MD  gabapentin  (NEURONTIN ) 800 MG tablet Take 800 mg by mouth 3 (three) times daily. 08/01/21  Yes [provider]  isosorbide  mononitrate (IMDUR ) 30 MG 24 hr tablet Take 1 tablet (30 mg total) by mouth daily. 05/02/23  Yes Avanell Leigh, MD  JARDIANCE  25 MG TABS tablet Take 25 mg by mouth daily.   Yes [provider]  LANTUS  SOLOSTAR 100 UNIT/ML Solostar Pen Inject 4-9 Units into the skin See admin instructions. Inject 4-9 units into the skin at bedtime, per sliding scale 03/24/23  Yes [provider]  lidocaine  (LIDODERM ) 5 % Place 1 patch onto the skin daily as needed (for pain- on 12 hours/off 12 hours). 08/07/21  Yes [provider]  losartan  (COZAAR ) 25 MG tablet Take 1 tablet (25 mg total) by mouth daily before breakfast. 01/22/22 02/25/24 Yes Dahal, Aminta Baldy, MD  pantoprazole  (PROTONIX ) 40 MG tablet Take 1 tablet (40 mg total) by mouth daily. Patient taking differently: Take 40 mg by mouth daily before breakfast. 05/23/23 02/25/24 Yes Ghimire, Kuber, MD  pioglitazone (ACTOS) 45 MG tablet Take 45 mg by mouth daily. 05/06/23  Yes [provider]  traZODone  (DESYREL ) 100 MG tablet Take 100 mg by mouth at bedtime.   Yes  [provider]  aspirin  EC 81 MG tablet Take 1 tablet (81 mg total) by mouth daily. Patient not taking: Reported on 02/25/2024 07/13/19   Avanell Leigh, MD  cyanocobalamin  1000 MCG tablet Take 1 tablet (1,000 mcg total) by mouth daily. Patient not taking: Reported on 02/25/2024 02/23/23   True Fuss, MD     Critical care time: Robert

## 2024-03-01 NOTE — Progress Notes (Signed)
 PROGRESS NOTE    [name]  ZOX:[id number] DOB: 27-Aug-1930 DOA: 02/25/2024 PCP: Gwyndolyn Lerner, PA-C   Brief Narrative: This 89 y.o. male with PMH significant for hypertension, hyperlipidemia, type 2 diabetes, hard of hearing, history of atrial flutter, not on anticoagulation due to recurrent falls, aortic insufficiency, presented in the ED with progressive shortness of breath for last few days. He was hypoxic, SPO2 was 75% on room air in ED, requiring up to 4 L of supplemental oxygen.  X-[place] and CT chest shows large left pleural effusion.  IR consulted,  Patient underwent thoracocentesis.  1.4 L of clear fluid drained. Patient feels much improved,  He still remains on 4 L of oxygen. Patient denies any chest pain.  Patient was admitted for further evaluation.   Assessment & Plan:   Principal Problem:   Acute hypoxic respiratory failure (HCC) Active Problems:   AF (paroxysmal atrial fibrillation) (HCC)   Coronary artery disease involving native coronary artery of native heart   Uncontrolled type 2 diabetes mellitus with hyperglycemia, with long-term current use of insulin  (HCC)   Essential hypertension   Hyperlipidemia   Basal cell carcinoma of scalp and skin of neck   Malignant melanoma of head and neck (HCC)  Acute hypoxic respiratory failure: Large left pleural effusion: Patient presented with progressive shortness of breath, hypoxia requiring supplemental oxygen,  Now on 4 L of supplemental oxygen.   Chest x-Zayvion showed large left pleural effusion.  BNP found to be 388.5 CT chest shows large left pleural effusion. IR consulted,  Patient underwent ultrasound-guided thoracocentesis 1.4 L of clear fluid drained. Continue supplemental oxygen and wean as tolerated. Continued on Lasix  40 mg IV daily.  Monitor intake / output charting. Patient started having fever.  Antibiotics started for suspected pneumonia. Procalcitonin 0.10., chest x-Jaivyn shows moderate effusion and pulmonary  edema. Patient underwent repeat thoracocentesis 1.8 L of clear fluid drained. Patient reports feeling much better today.  Pulmonology consulted.   CAD: Patient denies chest pain, Palpitations. Continue Imdur , Cardizem , aspirin , Lipitor .   Atrial fibrillation: Heart rate controlled, continue Cardizem . Patient is not on anticoagulation due to old age / recurrent falls.   Diabetes mellitus: Hold on p.o. diabetic medications. Continue regular insulin  sliding scale, Carb modified diet.   Essential hypertension: Continue Cardizem , hold on losartan .   Hyperlipidemia: Continue Lipitor  40 mg daily.   History of malignant melanoma of head and neck: Patient does have history of malignant melanoma in the past. This effusion could be related to this.    Hard of Hearing: Continue supportive care.  Acute kidney injury: Likely due to decreased intake and Lasix . Monitor serum creatinine.  Creatinine improving with holding Lasix    DVT prophylaxis: SCDs Code Status: DNR Family Communication: Spoke with daughter Rice Chamorro at bed side. Disposition Plan:    Status is: Inpatient Remains inpatient appropriate because: Admitted for acute hypoxic respiratory failure secondary to left large pleural effusion requiring thoracocentesis.  PT and OT recommended SNF.  TOC working on finding a place.    Consultants:  None  Procedures: Thoracocentesis Antimicrobials: None  Subjective: Patient was seen and examined at bedside.  Overnight events noted. Patient reports feeling better,  He underwent repeat thoracocentesis,  1.8 L of clear fluid drained.  Objective: Vitals:   02/29/24 0416 02/29/24 1944 03/01/24 0438 03/01/24 1042  BP: 128/68 (!) 103/59 (!) 128/52 (!) 98/42  Pulse: 91 67 68   Resp: 18 16 18    Temp: 97.6 F (36.4 C) 97.9 F (36.6 C) (!)  97.5 F (36.4 C)   TempSrc: Oral     SpO2: 95% 92% 96%   Weight:      Height:        Intake/Output Summary (Last 24 hours) at 03/01/2024  1124 Last data filed at 03/01/2024 0900 Gross per 24 hour  Intake 240 ml  Output 800 ml  Net -560 ml   Filed Weights   02/26/24 0521  Weight: 79.5 kg    Examination:  General exam: Appears calm and comfortable, not in any acute distress.  Deconditioned. Respiratory system: Good air entry both sides,  respiratory effort normal.  RR 16 Cardiovascular system: S1 & S2 heard, RRR. No JVD, murmurs, rubs, gallops or clicks. Gastrointestinal system: Abdomen is non distended, soft and non tender.  Normal bowel sounds heard. Central nervous system: Alert and oriented X 3. No focal neurological deficits. Extremities: No edema, no cyanosis, no clubbing. Skin: No rashes, lesions or ulcers Psychiatry: Judgement and insight appear normal. Mood & affect appropriate.     Data Reviewed: I have personally reviewed following labs and imaging studies  CBC: Recent Labs  Lab 02/25/24 1206 02/26/24 0545 02/27/24 0528 02/28/24 0733 03/01/24 0517  WBC 7.8 9.8 9.5 9.5 8.6  HGB 9.2* 10.3* 9.6* 10.1* 9.4*  HCT 30.2* 35.3* 31.7* 34.0* 30.4*  MCV 88.8 92.2 90.8 91.6 88.4  PLT 244 259 197 199 200   Basic Metabolic Panel: Recent Labs  Lab 02/25/24 1206 02/26/24 0715 02/27/24 0528 02/28/24 0733 03/01/24 0517  NA 140 143 142 142 137  K 4.1 3.8 4.1 3.9 3.6  CL 108 105 105 103 98  CO2 23 29 27 29 30   GLUCOSE 345* 144* 220* 193* 219*  BUN 33* 31* 45* 47* 50*  CREATININE 1.17 1.41* 1.63* 1.89* 1.35*  CALCIUM  8.7* 9.3 8.8* 9.2 8.8*  MG  --  2.2  --  2.2 2.0  PHOS  --  4.4  --  3.4 2.8   GFR: Estimated Creatinine Clearance: 38.3 mL/min (A) (by C-G formula based on SCr of 1.35 mg/dL (H)). Liver Function Tests: Recent Labs  Lab 02/26/24 0715  AST 14*  ALT 13  ALKPHOS 100  BILITOT 1.2  PROT 6.4*  ALBUMIN 3.0*   No results for input(s): "LIPASE", "AMYLASE" in the last 168 hours. No results for input(s): "AMMONIA" in the last 168 hours. Coagulation Profile: No results for input(s):  "INR", "PROTIME" in the last 168 hours. Cardiac Enzymes: No results for input(s): "CKTOTAL", "CKMB", "CKMBINDEX", "TROPONINI" in the last 168 hours. BNP (last 3 results) No results for input(s): "PROBNP" in the last 8760 hours. HbA1C: No results for input(s): "HGBA1C" in the last 72 hours.  CBG: Recent Labs  Lab 02/29/24 0740 02/29/24 1246 02/29/24 1734 02/29/24 2030 03/01/24 0739  GLUCAP 199* 214* 364* 284* 227*   Lipid Profile: No results for input(s): "CHOL", "HDL", "LDLCALC", "TRIG", "CHOLHDL", "LDLDIRECT" in the last 72 hours. Thyroid Function Tests: No results for input(s): "TSH", "T4TOTAL", "FREET4", "T3FREE", "THYROIDAB" in the last 72 hours. Anemia Panel: No results for input(s): "VITAMINB12", "FOLATE", "FERRITIN", "TIBC", "IRON", "RETICCTPCT" in the last 72 hours. Sepsis Labs: Recent Labs  Lab 02/28/24 1211  PROCALCITON <0.10    Recent Results (from the past 240 hours)  Fungus Culture With Stain     Status: None (Preliminary result)   Collection Time: 02/25/24  3:54 PM   Specimen: PATH Cytology Pleural fluid  Result Value Ref Range Status   Fungus Stain Final report  Final    Comment: (  NOTE) Performed At: [facility] [address] [place], [place] [id number] Pearlean Botts MD WU:[id number]    Fungus (Mycology) Culture PENDING  Incomplete   Fungal Source PLEURAL  Final    Comment: LEFT SIDE Performed at [facility], [address], [place], [place] [address]   Acid Fast Smear (AFB)     Status: None   Collection Time: 02/25/24  3:54 PM   Specimen: PATH Cytology Pleural fluid  Result Value Ref Range Status   AFB Specimen Processing Direct Inoculation  Final   Acid Fast Smear Negative  Final    Comment: (NOTE) Performed At: [facility] [address] [place], [place] [id number] Pearlean Botts MD NG:[id number]    Source (AFB) PLEURAL  Final    Comment: LEFT SIDE Performed at [facility], [address], [place], [place] [address]   Culture, body fluid w Gram Stain-bottle     Status: None   Collection Time: 02/25/24  3:54 PM   Specimen: Fluid  Result Value Ref Range Status   Specimen Description FLUID PLEURAL LEFT  Final   Special Requests BOTTLES DRAWN AEROBIC AND ANAEROBIC  Final   Culture   Final    NO GROWTH 5 DAYS Performed at [facility], [address], [place], [place] [address]    Report Status 03/01/2024 FINAL  Final  Gram stain     Status: None   Collection Time: 02/25/24  3:54 PM   Specimen: Fluid  Result Value Ref Range Status   Specimen Description FLUID PLEURAL LEFT  Final   Special Requests NONE  Final   Gram Stain   Final    WBC PRESENT, PREDOMINANTLY MONONUCLEAR NO ORGANISMS SEEN CYTOSPIN SMEAR Performed at [facility], [address] [place], [place] [address]    Report Status 02/25/2024 FINAL  Final  Fungus Culture Result     Status: None   Collection Time: 02/25/24  3:54 PM  Result Value Ref Range Status   Result 1 Comment  Final    Comment: (NOTE) KOH/Calcofluor preparation:  no fungus observed. Performed At: [facility] [address] [place], [place] [id number] Pearlean Botts MD OV:[id number]     Radiology Studies: DG Chest 1 View Result Date: 02/29/2024 CLINICAL DATA:  Status post left-sided thoracentesis. EXAM: CHEST  1 VIEW COMPARISON:  Chest x-Feras 02/27/2024 FINDINGS: The heart is mildly enlarged appears stable. Interval significant decrease in size of the left-sided pleural effusion. Overlying atelectasis noted but no postprocedural pneumothorax. Overall the lungs appear better aerated with resolving pulmonary edema. IMPRESSION: 1. Interval significant decrease in size of left-sided pleural effusion. No postprocedural pneumothorax. 2. Improved aeration of the lungs with resolving pulmonary edema. Electronically Signed   By: Marrian Siva M.D.   On: 02/29/2024 18:23   US  THORACENTESIS ASP PLEURAL SPACE W/IMG  GUIDE Result Date: 02/29/2024 INDICATION: 89 year old male with history of coronary artery disease, Afib, malignant melanoma of the head and neck, and recurrent left-sided pleural effusion. IR was requested for therapeutic left thoracentesis. EXAM: ULTRASOUND GUIDED THERAPEUTIC THORACENTESIS MEDICATIONS: 8 cc of 1% lidocaine  COMPLICATIONS: None immediate. PROCEDURE: An ultrasound guided thoracentesis was thoroughly discussed with the patient and questions answered. The benefits, risks, alternatives and complications were also discussed. The patient understands and wishes to proceed with the procedure. Written consent was obtained. Ultrasound was performed to localize and mark an adequate pocket of fluid in the left chest. The area was then prepped and draped in the normal sterile  fashion. 1% Lidocaine  was used for local anesthesia. Under ultrasound guidance a 6 Fr Safe-T-Centesis catheter was introduced. Thoracentesis was performed. The catheter was removed and a dressing applied. FINDINGS: A total of approximately 1.8 L of clear, amber colored pleural fluid was removed. IMPRESSION: Successful ultrasound guided left thoracentesis yielding 1.8 L of pleural fluid. Procedure performed by Lambert Pillion, PA-C Electronically Signed   By: Erica Hau M.D.   On: 02/29/2024 15:01   Scheduled Meds:  atorvastatin   80 mg Oral QPM   diltiazem   180 mg Oral Daily   docusate sodium   100 mg Oral BID   doxycycline  100 mg Oral Q12H   gabapentin   300 mg Oral BID   insulin  aspart  0-9 Units Subcutaneous TID WC   insulin  aspart  2 Units Subcutaneous Daily   insulin  glargine-yfgn  4 Units Subcutaneous Daily   isosorbide  mononitrate  30 mg Oral Daily   senna  1 tablet Oral BID   traZODone   100 mg Oral QHS   Continuous Infusions:  cefTRIAXone  (ROCEPHIN )  IV 2 g (03/01/24 0821)      LOS: 5 days    Time spent: 35 Mins    Magdalene School, MD [facility]   If 7PM-7AM, please contact night-coverage

## 2024-03-01 NOTE — Plan of Care (Signed)

## 2024-03-02 DIAGNOSIS — J9601 Acute respiratory failure with hypoxia: Secondary | ICD-10-CM | POA: Diagnosis not present

## 2024-03-02 LAB — CBC
HCT: 31.4 % — ABNORMAL LOW (ref 39.0–52.0)
Hemoglobin: 9.6 g/dL — ABNORMAL LOW (ref 13.0–17.0)
MCH: 27.5 pg (ref 26.0–34.0)
MCHC: 30.6 g/dL (ref 30.0–36.0)
MCV: 90 fL (ref 80.0–100.0)
Platelets: 212 10*3/uL (ref 150–400)
RBC: 3.49 MIL/uL — ABNORMAL LOW (ref 4.22–5.81)
RDW: 18.2 % — ABNORMAL HIGH (ref 11.5–15.5)
WBC: 8.1 10*3/uL (ref 4.0–10.5)
nRBC: 0 % (ref 0.0–0.2)

## 2024-03-02 LAB — BASIC METABOLIC PANEL WITH GFR
Anion gap: 7 (ref 5–15)
BUN: 45 mg/dL — ABNORMAL HIGH (ref 8–23)
CO2: 28 mmol/L (ref 22–32)
Calcium: 8.8 mg/dL — ABNORMAL LOW (ref 8.9–10.3)
Chloride: 102 mmol/L (ref 98–111)
Creatinine, Ser: 1.09 mg/dL (ref 0.61–1.24)
GFR, Estimated: >60 mL/min (ref >60)
Glucose, Bld: 218 mg/dL — ABNORMAL HIGH (ref 70–99)
Potassium: 3.5 mmol/L (ref 3.5–5.1)
Sodium: 137 mmol/L (ref 135–145)

## 2024-03-02 LAB — SURGICAL PATHOLOGY

## 2024-03-02 LAB — GLUCOSE, CAPILLARY
Glucose-Capillary: 213 mg/dL — ABNORMAL HIGH (ref 70–99)
Glucose-Capillary: 272 mg/dL — ABNORMAL HIGH (ref 70–99)
Glucose-Capillary: 289 mg/dL — ABNORMAL HIGH (ref 70–99)
Glucose-Capillary: 226 mg/dL — ABNORMAL HIGH (ref 70–99)

## 2024-03-02 MED ORDER — FUROSEMIDE 40 MG PO TABS
40.0000 mg | ORAL_TABLET | Freq: Every day | ORAL | Status: DC
  Administered 2024-03-02 – 2024-03-04 (×3): 40 mg via ORAL
  Filled 2024-03-02 (×3): qty 1

## 2024-03-02 NOTE — Plan of Care (Signed)
  Problem: Clinical Measurements: Goal: Respiratory complications will improve Outcome: Progressing Goal: Cardiovascular complication will be avoided Outcome: Progressing   Problem: Coping: Goal: Level of anxiety will decrease Outcome: Progressing   Problem: Elimination: Goal: Will not experience complications related to urinary retention Outcome: Progressing   Problem: Pain Managment: Goal: General experience of comfort will improve and/or be controlled Outcome: Progressing   Problem: Skin Integrity: Goal: Risk for impaired skin integrity will decrease Outcome: Progressing

## 2024-03-02 NOTE — TOC Progression Note (Signed)
 Transition of Care [facility]) - Progression Note    Patient Details  Name: [name] MRN: [id number] Date of Birth: [birth year]  Transition of Care [facility]) CM/SW Contact  Amaryllis Junior, LCSW Phone Number: 03/02/2024, 3:36 PM  Clinical Narrative:    CSW uploaded additional clinical documentation as requested by [employer]. Ins auth pending approval.    Expected Discharge Plan: Skilled Nursing Facility Barriers to Discharge: Continued Medical Work up, SNF Pending bed offer  Expected Discharge Plan and Services In-house Referral: Clinical Social Work Discharge Planning Services: NA Post Acute Care Choice: Skilled Nursing Facility Living arrangements for the past 2 months: Single Family Home                 DME Arranged: N/A DME Agency: NA       HH Arranged: NA HH Agency: NA         Social Determinants of Health (SDOH) Interventions SDOH Screenings   Food Insecurity: No Food Insecurity (05/21/2023)  Housing: Low Risk  (05/21/2023)  Transportation Needs: No Transportation Needs (05/21/2023)  Utilities: Not At Risk (05/21/2023)  Tobacco Use: Medium Risk (02/26/2024)    Readmission Risk Interventions    02/26/2024   10:35 AM 02/22/2023    1:30 PM  Readmission Risk Prevention Plan  Transportation Screening Complete Complete  PCP or Specialist Appt within 5-7 Days Complete   PCP or Specialist Appt within 3-5 Days  Complete  Home Care Screening Complete   Medication Review (RN CM) Complete   HRI or Home Care Consult  Complete  Social Work Consult for Recovery Care Planning/Counseling  Complete  Palliative Care Screening  Not Applicable  Medication Review Oceanographer)  Complete

## 2024-03-02 NOTE — Inpatient Diabetes Management (Signed)
 Inpatient Diabetes Program Recommendations  AACE/ADA: New Consensus Statement on Inpatient Glycemic Control (2015)  Target Ranges:  Prepandial:   less than 140 mg/dL      Peak postprandial:   less than 180 mg/dL (1-2 hours)      Critically ill patients:  140 - 180 mg/dL   Lab Results  Component Value Date   GLUCAP 213 (H) 03/02/2024   HGBA1C 10.5 (H) 02/25/2024    Review of Glycemic Control  Latest Reference Range & Units 02/29/24 20:30 03/01/24 07:39 03/01/24 12:01 03/01/24 16:41 03/01/24 21:54 03/02/24 07:34  Glucose-Capillary 70 - 99 mg/dL 098 (H) 119 (H) 147 (H) 289 (H) 229 (H) 213 (H)  (H): Data is abnormally high Diabetes history: DM2 Outpatient Diabetes medications:  Lantus  4-9 units every day Jardiance  25 mg every day Actos 45 mg QD Current orders for Inpatient glycemic control:  Novolog  0-9 units TID Semglee  4 units QD   Inpatient Diabetes Program Recommendations:     May consider further increasing Semglee  to 7 units every day   Thanks, Marjo Sievert, MSN, RNC-OB Diabetes Coordinator [phone] (8a-5p)

## 2024-03-02 NOTE — Progress Notes (Signed)
 PROGRESS NOTE    [name]  ZOX:[id number] DOB: [birth year] DOA: 02/25/2024 PCP: Gwyndolyn Lerner, PA-C   Brief Narrative: This 89 y.o. male with PMH significant for hypertension, hyperlipidemia, type 2 diabetes, hard of hearing, history of atrial flutter, not on anticoagulation due to recurrent falls, aortic insufficiency, presented in the ED with progressive shortness of breath for last few days. He was hypoxic, SPO2 was 75% on room air in ED, requiring up to 4 L of supplemental oxygen.  X-Otho and CT chest shows large left pleural effusion.  IR consulted,  Patient underwent thoracocentesis.  1.4 L of clear fluid drained. Patient feels much improved,  He still remains on 4 L of oxygen. Patient denies any chest pain.  Patient was admitted for further evaluation.   Assessment & Plan:   Principal Problem:   Acute hypoxic respiratory failure (HCC) Active Problems:   AF (paroxysmal atrial fibrillation) (HCC)   Coronary artery disease involving native coronary artery of native heart   Uncontrolled type 2 diabetes mellitus with hyperglycemia, with long-term current use of insulin  (HCC)   Essential hypertension   Hyperlipidemia   Basal cell carcinoma of scalp and skin of neck   Malignant melanoma of head and neck (HCC)  Acute hypoxic respiratory failure: Large left pleural effusion: Patient presented with progressive shortness of breath, hypoxia requiring supplemental oxygen,  Now on 4 L of supplemental oxygen.   Chest x-Lonnie showed large left pleural effusion.  BNP found to be 388.5 CT chest shows large left pleural effusion. IR consulted,  Patient underwent ultrasound-guided thoracocentesis 1.4 L of clear fluid drained. Continue supplemental oxygen and wean as tolerated. Continued on Lasix  40 mg IV daily.  Monitor intake / output charting. Patient started having fever.  Antibiotics started for suspected pneumonia. Procalcitonin 0.10., chest x-Tyton shows moderate effusion and pulmonary  edema. Patient underwent repeat thoracocentesis 1.8 L of clear fluid drained. Patient reports feeling much better today.  Pulmonology consulted. Follow-up pulmonology for discharge planning.  CAD: Patient denies chest pain, Palpitations. Continue Imdur , Cardizem , aspirin , Lipitor .   Atrial fibrillation: Heart rate controlled, continue Cardizem . Patient is not on anticoagulation due to old age / recurrent falls.   Diabetes mellitus: Hold on p.o. diabetic medications. Continue regular insulin  sliding scale, Carb modified diet.   Essential hypertension: Continue Cardizem , hold on losartan .   Hyperlipidemia: Continue Lipitor  40 mg daily.   History of malignant melanoma of head and neck: Patient does have history of malignant melanoma in the past. This effusion could be related to this.    Hard of Hearing: Continue supportive care.  Acute kidney injury: > Resolved Likely due to decreased intake and Lasix . Monitor serum creatinine.  Creatinine improving with holding Lasix .   DVT prophylaxis: SCDs Code Status: DNR Family Communication: Spoke with daughter Rice Chamorro at bed side. Disposition Plan:    Status is: Inpatient Remains inpatient appropriate because: Admitted for acute hypoxic respiratory failure secondary to left large pleural effusion requiring thoracocentesis.  PT and OT recommended SNF.  TOC working on finding a place.    Consultants:  None  Procedures: Thoracocentesis Antimicrobials: None  Subjective: Patient was seen and examined at bedside.  Overnight events noted. Patient reports feeling better, denies any chest pain or shortness of breath. Patient is hard of hearing.  Objective: Vitals:   03/01/24 1301 03/01/24 1950 03/02/24 0325 03/02/24 1133  BP: 124/61 (!) 112/55 133/63 (!) 144/71  Pulse: 74 70 65 77  Resp: 17 16 18    Temp: 98 F (36.7  C) 97.7 F (36.5 C) 97.9 F (36.6 C) 97.8 F (36.6 C)  TempSrc:   Oral Axillary  SpO2: 90% 96% 96% 95%   Weight:      Height:        Intake/Output Summary (Last 24 hours) at 03/02/2024 1240 Last data filed at 03/02/2024 7829 Gross per 24 hour  Intake 480 ml  Output 900 ml  Net -420 ml   Filed Weights   02/26/24 0521  Weight: 79.5 kg    Examination:  General exam: Appears calm and comfortable, not in any acute distress.  Deconditioned. Respiratory system: Good air entry both sides,  respiratory effort normal.  RR 16 Cardiovascular system: S1 & S2 heard, RRR. No JVD, murmurs, rubs, gallops or clicks. Gastrointestinal system: Abdomen is non distended, soft and non tender.  Normal bowel sounds heard. Central nervous system: Alert and oriented X 3. No focal neurological deficits. Extremities: No edema, no cyanosis, no clubbing. Skin: No rashes, lesions or ulcers Psychiatry: Judgement and insight appear normal. Mood & affect appropriate.     Data Reviewed: I have personally reviewed following labs and imaging studies  CBC: Recent Labs  Lab 02/26/24 0545 02/27/24 0528 02/28/24 0733 03/01/24 0517 03/02/24 0515  WBC 9.8 9.5 9.5 8.6 8.1  HGB 10.3* 9.6* 10.1* 9.4* 9.6*  HCT 35.3* 31.7* 34.0* 30.4* 31.4*  MCV 92.2 90.8 91.6 88.4 90.0  PLT 259 197 199 200 212   Basic Metabolic Panel: Recent Labs  Lab 02/26/24 0715 02/27/24 0528 02/28/24 0733 03/01/24 0517 03/02/24 0515  NA 143 142 142 137 137  K 3.8 4.1 3.9 3.6 3.5  CL 105 105 103 98 102  CO2 29 27 29 30 28   GLUCOSE 144* 220* 193* 219* 218*  BUN 31* 45* 47* 50* 45*  CREATININE 1.41* 1.63* 1.89* 1.35* 1.09  CALCIUM  9.3 8.8* 9.2 8.8* 8.8*  MG 2.2  --  2.2 2.0  --   PHOS 4.4  --  3.4 2.8  --    GFR: Estimated Creatinine Clearance: 47.5 mL/min (by C-G formula based on SCr of 1.09 mg/dL). Liver Function Tests: Recent Labs  Lab 02/26/24 0715  AST 14*  ALT 13  ALKPHOS 100  BILITOT 1.2  PROT 6.4*  ALBUMIN 3.0*   No results for input(s): "LIPASE", "AMYLASE" in the last 168 hours. No results for input(s): "AMMONIA"  in the last 168 hours. Coagulation Profile: No results for input(s): "INR", "PROTIME" in the last 168 hours. Cardiac Enzymes: No results for input(s): "CKTOTAL", "CKMB", "CKMBINDEX", "TROPONINI" in the last 168 hours. BNP (last 3 results) No results for input(s): "PROBNP" in the last 8760 hours. HbA1C: No results for input(s): "HGBA1C" in the last 72 hours.  CBG: Recent Labs  Lab 03/01/24 1201 03/01/24 1641 03/01/24 2154 03/02/24 0734 03/02/24 1130  GLUCAP 226* 289* 229* 213* 272*   Lipid Profile: No results for input(s): "CHOL", "HDL", "LDLCALC", "TRIG", "CHOLHDL", "LDLDIRECT" in the last 72 hours. Thyroid Function Tests: No results for input(s): "TSH", "T4TOTAL", "FREET4", "T3FREE", "THYROIDAB" in the last 72 hours. Anemia Panel: No results for input(s): "VITAMINB12", "FOLATE", "FERRITIN", "TIBC", "IRON", "RETICCTPCT" in the last 72 hours. Sepsis Labs: Recent Labs  Lab 02/28/24 1211  PROCALCITON <0.10    Recent Results (from the past 240 hours)  Fungus Culture With Stain     Status: None (Preliminary result)   Collection Time: 02/25/24  3:54 PM   Specimen: PATH Cytology Pleural fluid  Result Value Ref Range Status   Fungus Stain Final report  Final    Comment: (NOTE) Performed At: [facility] [address] [place], [place] [id number] Pearlean Botts MD WU:[id number]    Fungus (Mycology) Culture PENDING  Incomplete   Fungal Source PLEURAL  Final    Comment: LEFT SIDE Performed at [facility], [address], [place], [place] [address]   Acid Fast Smear (AFB)     Status: None   Collection Time: 02/25/24  3:54 PM   Specimen: PATH Cytology Pleural fluid  Result Value Ref Range Status   AFB Specimen Processing Direct Inoculation  Final   Acid Fast Smear Negative  Final    Comment: (NOTE) Performed At: [facility] [address] [place], [place] [id number] Pearlean Botts MD NG:[id number]    Source (AFB) PLEURAL  Final     Comment: LEFT SIDE Performed at [facility], [address], [place], [place] [address]   Culture, body fluid w Gram Stain-bottle     Status: None   Collection Time: 02/25/24  3:54 PM   Specimen: Fluid  Result Value Ref Range Status   Specimen Description FLUID PLEURAL LEFT  Final   Special Requests BOTTLES DRAWN AEROBIC AND ANAEROBIC  Final   Culture   Final    NO GROWTH 5 DAYS Performed at [facility], [address], [place], [place] [address]    Report Status 03/01/2024 FINAL  Final  Gram stain     Status: None   Collection Time: 02/25/24  3:54 PM   Specimen: Fluid  Result Value Ref Range Status   Specimen Description FLUID PLEURAL LEFT  Final   Special Requests NONE  Final   Gram Stain   Final    WBC PRESENT, PREDOMINANTLY MONONUCLEAR NO ORGANISMS SEEN CYTOSPIN SMEAR Performed at [facility], [address], [place], [place] [address]    Report Status 02/25/2024 FINAL  Final  Fungus Culture Result     Status: None   Collection Time: 02/25/24  3:54 PM  Result Value Ref Range Status   Result 1 Comment  Final    Comment: (NOTE) KOH/Calcofluor preparation:  no fungus observed. Performed At: [facility] [address] [place], [place] [id number] Pearlean Botts MD OV:[id number]     Radiology Studies: DG Chest 1 View Result Date: 02/29/2024 CLINICAL DATA:  Status post left-sided thoracentesis. EXAM: CHEST  1 VIEW COMPARISON:  Chest x-Alexx 02/27/2024 FINDINGS: The heart is mildly enlarged appears stable. Interval significant decrease in size of the left-sided pleural effusion. Overlying atelectasis noted but no postprocedural pneumothorax. Overall the lungs appear better aerated with resolving pulmonary edema. IMPRESSION: 1. Interval significant decrease in size of left-sided pleural effusion. No postprocedural pneumothorax. 2. Improved aeration of the lungs with resolving pulmonary edema. Electronically Signed   By: Marrian Siva M.D.    On: 02/29/2024 18:23   US  THORACENTESIS ASP PLEURAL SPACE W/IMG GUIDE Result Date: 02/29/2024 INDICATION: 89 year old male with history of coronary artery disease, Afib, malignant melanoma of the head and neck, and recurrent left-sided pleural effusion. IR was requested for therapeutic left thoracentesis. EXAM: ULTRASOUND GUIDED THERAPEUTIC THORACENTESIS MEDICATIONS: 8 cc of 1% lidocaine  COMPLICATIONS: None immediate. PROCEDURE: An ultrasound guided thoracentesis was thoroughly discussed with the patient and questions answered. The benefits, risks, alternatives and complications were also discussed. The patient understands and wishes to proceed with the procedure. Written consent was obtained. Ultrasound was performed to localize and mark an adequate pocket of fluid in the left chest. The area was then prepped and  draped in the normal sterile fashion. 1% Lidocaine  was used for local anesthesia. Under ultrasound guidance a 6 Fr Safe-T-Centesis catheter was introduced. Thoracentesis was performed. The catheter was removed and a dressing applied. FINDINGS: A total of approximately 1.8 L of clear, amber colored pleural fluid was removed. IMPRESSION: Successful ultrasound guided left thoracentesis yielding 1.8 L of pleural fluid. Procedure performed by Lambert Pillion, PA-C Electronically Signed   By: Erica Hau M.D.   On: 02/29/2024 15:01   Scheduled Meds:  atorvastatin   80 mg Oral QPM   diltiazem   180 mg Oral Daily   docusate sodium   100 mg Oral BID   doxycycline  100 mg Oral Q12H   gabapentin   300 mg Oral BID   insulin  aspart  0-9 Units Subcutaneous TID WC   insulin  glargine-yfgn  4 Units Subcutaneous Daily   isosorbide  mononitrate  30 mg Oral Daily   senna  1 tablet Oral BID   traZODone   100 mg Oral QHS   Continuous Infusions:  cefTRIAXone  (ROCEPHIN )  IV 2 g (03/02/24 0839)      LOS: 6 days    Time spent: 35 Mins    Magdalene School, MD [facility]   If 7PM-7AM, please contact  night-coverage

## 2024-03-02 NOTE — Plan of Care (Addendum)
 VSS. Remains on room air with O2 sat >95%. No c/o pain. BG 229 at bedtime. Patient verbalized difficulty falling asleep, given one time dose of melatonin with improvement. No acute events overnight.  Problem: Metabolic: Goal: Ability to maintain appropriate glucose levels will improve Outcome: Progressing   Problem: Nutritional: Goal: Maintenance of adequate nutrition will improve Outcome: Progressing   Problem: Tissue Perfusion: Goal: Adequacy of tissue perfusion will improve Outcome: Progressing   Problem: Education: Goal: Knowledge of General Education information will improve Description: Including pain rating scale, medication(s)/side effects and non-pharmacologic comfort measures Outcome: Progressing   Problem: Clinical Measurements: Goal: Ability to maintain clinical measurements within normal limits will improve Outcome: Progressing   Problem: Activity: Goal: Risk for activity intolerance will decrease Outcome: Progressing   Problem: Safety: Goal: Ability to remain free from injury will improve Outcome: Progressing

## 2024-03-03 ENCOUNTER — Inpatient Hospital Stay (HOSPITAL_COMMUNITY)

## 2024-03-03 DIAGNOSIS — J9601 Acute respiratory failure with hypoxia: Secondary | ICD-10-CM | POA: Diagnosis not present

## 2024-03-03 LAB — GLUCOSE, CAPILLARY
Glucose-Capillary: 238 mg/dL — ABNORMAL HIGH (ref 70–99)
Glucose-Capillary: 304 mg/dL — ABNORMAL HIGH (ref 70–99)
Glucose-Capillary: 263 mg/dL — ABNORMAL HIGH (ref 70–99)
Glucose-Capillary: 261 mg/dL — ABNORMAL HIGH (ref 70–99)

## 2024-03-03 MED ORDER — INSULIN GLARGINE-YFGN 100 UNIT/ML ~~LOC~~ SOLN
6.0000 [IU] | Freq: Once | SUBCUTANEOUS | Status: DC
Start: 2024-03-03 — End: 2024-03-04
  Filled 2024-03-03: qty 0.06

## 2024-03-03 MED ORDER — INSULIN GLARGINE-YFGN 100 UNIT/ML ~~LOC~~ SOLN
10.0000 [IU] | Freq: Every day | SUBCUTANEOUS | Status: DC
  Administered 2024-03-04: 10 [IU] via SUBCUTANEOUS
  Filled 2024-03-03: qty 0.1

## 2024-03-03 NOTE — Plan of Care (Signed)
 VSS. No c/o pain. BG 226 at bedtime. LBM 5/13. No acute events overnight.  Problem: Fluid Volume: Goal: Ability to maintain a balanced intake and output will improve Outcome: Progressing   Problem: Metabolic: Goal: Ability to maintain appropriate glucose levels will improve Outcome: Progressing   Problem: Nutritional: Goal: Maintenance of adequate nutrition will improve Outcome: Progressing   Problem: Education: Goal: Knowledge of General Education information will improve Description: Including pain rating scale, medication(s)/side effects and non-pharmacologic comfort measures Outcome: Progressing   Problem: Clinical Measurements: Goal: Ability to maintain clinical measurements within normal limits will improve Outcome: Progressing   Problem: Activity: Goal: Risk for activity intolerance will decrease Outcome: Progressing   Problem: Safety: Goal: Ability to remain free from injury will improve Outcome: Progressing

## 2024-03-03 NOTE — Progress Notes (Signed)
 PROGRESS NOTE    [name]  YQM:[id number] DOB: Jun 03, 1930 DOA: 02/25/2024 PCP: Gwyndolyn Lerner, PA-C   Brief Narrative:  This 89 y.o. male with PMH significant for hypertension, hyperlipidemia, type 2 diabetes, hard of hearing, history of atrial flutter, not on anticoagulation due to recurrent falls, aortic insufficiency, presented in the ED with progressive shortness of breath for last few days. He was hypoxic, SPO2 was 75% on room air in ED, requiring up to 4 L of supplemental oxygen.  X-Kahlil and CT chest shows large left pleural effusion.  IR consulted,  Patient underwent thoracocentesis - oxygen weaned off - currently on room air at rest - continues to require oxygen with exertion -repeat chest x-Akiem 5/14 shows improved aeration compared to prior.  Appreciate pulmonology assistance.    Patient medically stable for discharge to skilled nursing facility, insurance has declined, peer to peer completed 03/03/2024, Dr. Toy Freund approved given his ongoing need for assistance with transitioning as well as ambulating with new oxygen requirements as well as walker.  Assessment & Plan:   Principal Problem:   Acute hypoxic respiratory failure (HCC) Active Problems:   AF (paroxysmal atrial fibrillation) (HCC)   Coronary artery disease involving native coronary artery of native heart   Uncontrolled type 2 diabetes mellitus with hyperglycemia, with long-term current use of insulin  (HCC)   Essential hypertension   Hyperlipidemia   Basal cell carcinoma of scalp and skin of neck   Malignant melanoma of head and neck (HCC)   Acute hypoxic respiratory failure: Large left pleural effusion: On room air at baseline, requiring upwards of 4 L nasal cannula to maintain sats even at rest -continues to be hypoxic with ambulation -Initial x-Kahlil confirms a left sided pleural effusion, large -status post thoracentesis x 2 with 1.8 and 1.4 L of fluid drained respectively. -Repeat chest x-Danyael 03/03/2024 shows marked  improvement in aeration -Continue p.o. diuretics Lasix  40 daily -follow creatinine in the next 1-2 weeks to ensure no recurrence of AKI and to titrate diuretics as appropriate   Atrial fibrillation: Heart rate controlled, continue Cardizem . Patient is not on anticoagulation due to old age / recurrent falls.   Diabetes mellitus: Hold on p.o. diabetic medications. Continue regular insulin  sliding scale, Carb modified diet.   Essential hypertension: Continue Cardizem , hold losartan . Hyperlipidemia: Continue Lipitor  40 mg daily. History of CAD: Continue Imdur , Cardizem , aspirin , Lipitor . History of malignant melanoma of head and neck: Follow up with primary oncology as scheduled  Advanced hearing impairment: Continue hearing aids. Acute kidney injury: Resolved  DVT prophylaxis: SCDs Start: 02/25/24 1407   Code Status:   Code Status: Limited: Do not attempt resuscitation (DNR) -DNR-LIMITED -Do Not Intubate/DNI  Family Communication: None present  Status is: Inpatient  Dispo: The patient is from: Home              Anticipated d/c is to: SNF              Anticipated d/c date is: Imminent              Patient currently is medically stable for discharge  Consultants:  Pulmonology  Procedures:  Thoracentesis, left x 2  Antimicrobials:  None  Subjective: No acute issues or events overnight, respiratory status improving denies nausea vomiting diarrhea constipation headache fevers chills chest pain shortness of breath.  He does report ongoing dyspnea with exertion but symptoms again are improving from prior.  Not yet back to baseline.  Objective: Vitals:   03/02/24 0325 03/02/24 1133 03/02/24  2037 03/03/24 0358  BP: 133/63 (!) 144/71 126/62 (!) 145/70  Pulse: 65 77 76 75  Resp: 18  18 18   Temp: 97.9 F (36.6 C) 97.8 F (36.6 C) 97.7 F (36.5 C) 98.4 F (36.9 C)  TempSrc: Oral Axillary    SpO2: 96% 95% 95% 93%  Weight:      Height:        Intake/Output Summary (Last 24  hours) at 03/03/2024 0733 Last data filed at 03/03/2024 0640 Gross per 24 hour  Intake 360 ml  Output 1250 ml  Net -890 ml   Filed Weights   02/26/24 0521  Weight: 79.5 kg    Examination:  General exam: Appears calm and comfortable  Respiratory system: Clear to auscultation. Respiratory effort normal. Cardiovascular system: S1 & S2 heard, RRR. No JVD, murmurs, rubs, gallops or clicks. No pedal edema. Gastrointestinal system: Abdomen is nondistended, soft and nontender. No organomegaly or masses felt. Normal bowel sounds heard. Central nervous system: Alert and oriented. No focal neurological deficits. Extremities: Symmetric 5 x 5 power. Skin: No rashes, lesions or ulcers Psychiatry: Judgement and insight appear normal. Mood & affect appropriate.     Data Reviewed: I have personally reviewed following labs and imaging studies  CBC: Recent Labs  Lab 02/26/24 0545 02/27/24 0528 02/28/24 0733 03/01/24 0517 03/02/24 0515  WBC 9.8 9.5 9.5 8.6 8.1  HGB 10.3* 9.6* 10.1* 9.4* 9.6*  HCT 35.3* 31.7* 34.0* 30.4* 31.4*  MCV 92.2 90.8 91.6 88.4 90.0  PLT 259 197 199 200 212   Basic Metabolic Panel: Recent Labs  Lab 02/26/24 0715 02/27/24 0528 02/28/24 0733 03/01/24 0517 03/02/24 0515  NA 143 142 142 137 137  K 3.8 4.1 3.9 3.6 3.5  CL 105 105 103 98 102  CO2 29 27 29 30 28   GLUCOSE 144* 220* 193* 219* 218*  BUN 31* 45* 47* 50* 45*  CREATININE 1.41* 1.63* 1.89* 1.35* 1.09  CALCIUM  9.3 8.8* 9.2 8.8* 8.8*  MG 2.2  --  2.2 2.0  --   PHOS 4.4  --  3.4 2.8  --    GFR: Estimated Creatinine Clearance: 47.5 mL/min (by C-G formula based on SCr of 1.09 mg/dL). Liver Function Tests: Recent Labs  Lab 02/26/24 0715  AST 14*  ALT 13  ALKPHOS 100  BILITOT 1.2  PROT 6.4*  ALBUMIN 3.0*   CBG: Recent Labs  Lab 03/01/24 2154 03/02/24 0734 03/02/24 1130 03/02/24 1653 03/02/24 2038  GLUCAP 229* 213* 272* 289* 226*   Sepsis Labs: Recent Labs  Lab 02/28/24 1211   PROCALCITON <0.10    Recent Results (from the past 240 hours)  Fungus Culture With Stain     Status: None (Preliminary result)   Collection Time: 02/25/24  3:54 PM   Specimen: PATH Cytology Pleural fluid  Result Value Ref Range Status   Fungus Stain Final report  Final    Comment: (NOTE) Performed At: [facility] [address] [place], [place] [id number] Pearlean Botts MD GG:[id number]    Fungus (Mycology) Culture PENDING  Incomplete   Fungal Source PLEURAL  Final    Comment: LEFT SIDE Performed at [facility], [address], [place], [place] [address]   Acid Fast Smear (AFB)     Status: None   Collection Time: 02/25/24  3:54 PM   Specimen: PATH Cytology Pleural fluid  Result Value Ref Range Status   AFB Specimen Processing Direct Inoculation  Final   Acid Fast Smear Negative  Final  Comment: (NOTE) Performed At: [facility] [address] [place], [place] [id number] Pearlean Botts MD NW:[id number]    Source (AFB) PLEURAL  Final    Comment: LEFT SIDE Performed at [facility], [address], [place], [place] [address]   Culture, body fluid w Gram Stain-bottle     Status: None   Collection Time: 02/25/24  3:54 PM   Specimen: Fluid  Result Value Ref Range Status   Specimen Description FLUID PLEURAL LEFT  Final   Special Requests BOTTLES DRAWN AEROBIC AND ANAEROBIC  Final   Culture   Final    NO GROWTH 5 DAYS Performed at [facility], [address], [place], [place] [address]    Report Status 03/01/2024 FINAL  Final  Gram stain     Status: None   Collection Time: 02/25/24  3:54 PM   Specimen: Fluid  Result Value Ref Range Status   Specimen Description FLUID PLEURAL LEFT  Final   Special Requests NONE  Final   Gram Stain   Final    WBC PRESENT, PREDOMINANTLY MONONUCLEAR NO ORGANISMS SEEN CYTOSPIN SMEAR Performed at [facility], [address] [place], [place] [address]    Report Status  02/25/2024 FINAL  Final  Fungus Culture Result     Status: None   Collection Time: 02/25/24  3:54 PM  Result Value Ref Range Status   Result 1 Comment  Final    Comment: (NOTE) KOH/Calcofluor preparation:  no fungus observed. Performed At: [facility] [address] [place], [place] [id number] Pearlean Botts MD ZD:[id number]     Radiology Studies: No results found.  Scheduled Meds:  atorvastatin   80 mg Oral QPM   diltiazem   180 mg Oral Daily   docusate sodium   100 mg Oral BID   doxycycline  100 mg Oral Q12H   furosemide   40 mg Oral Daily   gabapentin   300 mg Oral BID   insulin  aspart  0-9 Units Subcutaneous TID WC   insulin  glargine-yfgn  4 Units Subcutaneous Daily   isosorbide  mononitrate  30 mg Oral Daily   senna  1 tablet Oral BID   traZODone   100 mg Oral QHS   Continuous Infusions:  cefTRIAXone  (ROCEPHIN )  IV 2 g (03/02/24 0839)     LOS: 7 days   Time spent:  Haydee Lipa, DO [facility]  If 7PM-7AM, please contact night-coverage [email or web address]  03/03/2024, 7:33 AM

## 2024-03-03 NOTE — Progress Notes (Signed)
 Physical Therapy Treatment Patient Details Name: [name] MRN: [id number] DOB: [birth year] Today's Date: 03/03/2024   History of Present Illness 89 y.o. male with PMH significant for hypertension, hyperlipidemia, type 2 diabetes, hard of hearing, history of atrial flutter, not on anticoagulation due to recurrent falls, aortic insufficiency, presented 5/7 with progressive shortness of breath for last few days.  He was hypoxic, requiring up to 4 L.    PT Comments  Pt is progressing with mobility. Pt c/o left lower leg pain today due to a sore which has a wound dressing on it. Pt was agreeable to ambulation and general LE ther ex. Pt was min assist with mobility and ambulated 100 ft with RW. Pt demonstrated LE weakness and general fatigue. O2 stas 89-93% on RA with activity. Pt will continue to benefit from acute skilled PT to maximize mobility and independence for the next venue of care.   If plan is discharge home, recommend the following: A little help with walking and/or transfers;A little help with bathing/dressing/bathroom;Assistance with cooking/housework;Help with stairs or ramp for entrance;Assist for transportation   Can travel by private vehicle        Equipment Recommendations  None recommended by PT    Recommendations for Other Services       Precautions / Restrictions Precautions Precautions: Fall Restrictions Weight Bearing Restrictions Per Provider Order: No     Mobility  Bed Mobility Overal bed mobility: Needs Assistance Bed Mobility: Supine to Sit     Supine to sit: Min assist     General bed mobility comments: reaching for hand hold to lift trunk Patient Response: Cooperative  Transfers Overall transfer level: Needs assistance Equipment used: Rolling walker (2 wheels) Transfers: Sit to/from Stand Sit to Stand: Min assist                Ambulation/Gait Ambulation/Gait assistance: Contact guard assist Gait Distance (Feet): 100 Feet Assistive  device: Rolling walker (2 wheels) Gait Pattern/deviations: Decreased stride length, Shuffle, Trunk flexed, Step-through pattern Gait velocity: decreased         Stairs             Wheelchair Mobility     Tilt Bed Tilt Bed Patient Response: Cooperative  Modified Rankin (Stroke Patients Only)       Balance Overall balance assessment: Needs assistance Sitting-balance support: Feet supported Sitting balance-Leahy Scale: Good     Standing balance support: Bilateral upper extremity supported, Reliant on assistive device for balance Standing balance-Leahy Scale: Poor                              Communication Communication Communication: Impaired Factors Affecting Communication: Hearing impaired  Cognition Arousal: Alert Behavior During Therapy: WFL for tasks assessed/performed   PT - Cognitive impairments: No apparent impairments                         Following commands: Intact      Cueing Cueing Techniques: Verbal cues, Gestural cues  Exercises General Exercises - Lower Extremity Ankle Circles/Pumps: AROM, Strengthening, Both, 10 reps, Seated Long Arc Quad: AROM, Strengthening, Both, 10 reps, Seated Hip ABduction/ADduction: AAROM, Strengthening, Both, 10 reps, Seated Straight Leg Raises: AAROM, Strengthening, Both, 10 reps, Seated Hip Flexion/Marching: AROM, Strengthening, Both, 10 reps, Seated    General Comments        Pertinent Vitals/Pain Pain Assessment Pain Assessment: Faces Faces Pain Scale: Hurts little more Pain Location:  Left leg Pain Descriptors / Indicators: Discomfort Pain Intervention(s): Monitored during session, Repositioned    Home Living                          Prior Function            PT Goals (current goals can now be found in the care plan section) Progress towards PT goals: Progressing toward goals    Frequency    Min 2X/week      PT Plan      Co-evaluation               AM-PAC PT "6 Clicks" Mobility   Outcome Measure  Help needed turning from your back to your side while in a flat bed without using bedrails?: A Little Help needed moving from lying on your back to sitting on the side of a flat bed without using bedrails?: A Little Help needed moving to and from a bed to a chair (including a wheelchair)?: A Little Help needed standing up from a chair using your arms (e.g., wheelchair or bedside chair)?: A Little Help needed to walk in hospital room?: A Little Help needed climbing 3-5 steps with a railing? : A Lot 6 Click Score: 17    End of Session Equipment Utilized During Treatment: Gait belt Activity Tolerance: Patient tolerated treatment well Patient left: with call bell/phone within reach;in chair;with chair alarm set Nurse Communication: Mobility status PT Visit Diagnosis: History of falling (Z91.81);Muscle weakness (generalized) (M62.81);Other abnormalities of gait and mobility (R26.89)     Time: 4098-1191 PT Time Calculation (min) (ACUTE ONLY): 23 min  Charges:    $Gait Training: 8-22 mins $Therapeutic Exercise: 8-22 mins PT General Charges $$ ACUTE PT VISIT: 1 Visit                      [place] Kerstine 03/03/2024, 10:02 AM

## 2024-03-03 NOTE — TOC Progression Note (Addendum)
 Transition of Care [facility]) - Progression Note    Patient Details  Name: [name] MRN: [id number] Date of Birth: Jun 05, 1930  Transition of [facility]) CM/SW Contact  Marty Sleet, LCSW Phone Number: 03/03/2024, 11:55 AM  Clinical Narrative:    Pt's insurance offering a peer to peer review for SNF auth. Provider to call: [phone] Option 5. Deadline is: 03/03/24 1:30 PM. MD notified.  Spoke with daughter to inform of need for insurance approval prior to pt being able to transfer to SNF.   ADDENDUM: Insurance auth approved for SNF. Pt able to transfer tomorrow 5/15. Informed pt's daughter of approval and DC plans.    Expected Discharge Plan: Skilled Nursing Facility Barriers to Discharge: Continued Medical Work up, SNF Pending bed offer  Expected Discharge Plan and Services In-house Referral: Clinical Social Work Discharge Planning Services: NA Post Acute Care Choice: Skilled Nursing Facility Living arrangements for the past 2 months: Single Family Home                 DME Arranged: N/A DME Agency: NA       HH Arranged: NA HH Agency: NA         Social Determinants of Health (SDOH) Interventions SDOH Screenings   Food Insecurity: No Food Insecurity (05/21/2023)  Housing: Low Risk  (05/21/2023)  Transportation Needs: No Transportation Needs (05/21/2023)  Utilities: Not At Risk (05/21/2023)  Tobacco Use: Medium Risk (02/26/2024)    Readmission Risk Interventions    02/26/2024   10:35 AM 02/22/2023    1:30 PM  Readmission Risk Prevention Plan  Transportation Screening Complete Complete  PCP or Specialist Appt within 5-7 Days Complete   PCP or Specialist Appt within 3-5 Days  Complete  Home Care Screening Complete   Medication Review (RN CM) Complete   HRI or Home Care Consult  Complete  Social Work Consult for Recovery Care Planning/Counseling  Complete  Palliative Care Screening  Not Applicable  Medication Review Oceanographer)  Complete

## 2024-03-03 NOTE — Discharge Summary (Signed)
 Physician Discharge Summary  [name] NWG:[id number] DOB: June 08, 1930 DOA: 02/25/2024  PCP: Gwyndolyn Lerner, PA-C  Admit date: 02/25/2024 Discharge date: 03/04/2024  Admitted From: Home Disposition:  SNF  Recommendations for Outpatient Follow-up:  Follow up with PCP in 1-2 weeks  Discharge Condition:Stable  CODE STATUS:DNR  Diet recommendation: Low salt low fat diet as tolerated    Brief/Interim Summary: This 89 y.o. male with PMH significant for hypertension, hyperlipidemia, type 2 diabetes, hard of hearing, history of atrial flutter, not on anticoagulation due to recurrent falls, aortic insufficiency, presented in the ED with progressive shortness of breath for last few days. He was hypoxic, SPO2 was 75% on room air in ED, requiring up to 4 L of supplemental oxygen.  X-Tirso and CT chest shows large left pleural effusion.  IR consulted,  Patient underwent thoracocentesis - oxygen weaned off - currently on room air at rest - continues to require oxygen with exertion -repeat chest x-Deovion 5/14 shows improved aeration compared to prior.  Appreciate pulmonology assistance.    Patient medically stable for discharge to skilled nursing facility, insurance had previously declined, peer to peer completed 03/03/2024, Dr. Toy Freund approved given his ongoing need for assistance with transitioning as well as ambulating with new oxygen requirements as well as walker. Patient now discharging to facility for ongoing PT/OT evaluation and treatment. Follow up with PCP in 1-2 weeks for evaluation and possible need for repeat imaging per their discretion.   Discharge Diagnoses:  Principal Problem:   Acute hypoxic respiratory failure (HCC) Active Problems:   AF (paroxysmal atrial fibrillation) (HCC)   Coronary artery disease involving native coronary artery of native heart   Uncontrolled type 2 diabetes mellitus with hyperglycemia, with long-term current use of insulin  (HCC)   Essential hypertension    Hyperlipidemia   Basal cell carcinoma of scalp and skin of neck   Malignant melanoma of head and neck (HCC)  Acute hypoxic respiratory failure: Large left pleural effusion: On room air at baseline, requiring upwards of 4 L nasal cannula to maintain sats even at rest -continues to be hypoxic with ambulation -Initial x-Benedicto confirms a left sided pleural effusion, large -status post thoracentesis x 2 with 1.8 and 1.4 L of fluid drained respectively. -Repeat chest x-Thaine 03/03/2024 shows marked improvement in aeration -Continue p.o. diuretics Lasix  40 daily -follow creatinine in the next 1-2 weeks to ensure no recurrence of AKI and to titrate diuretics as appropriate   Atrial fibrillation: Heart rate controlled, continue Cardizem . Patient is not on anticoagulation due to old age / recurrent falls.   Diabetes mellitus: Hold on p.o. diabetic medications. Continue regular insulin  sliding scale, Carb modified diet.   Essential hypertension: Continue Cardizem , hold losartan . Hyperlipidemia: Continue Lipitor  40 mg daily. History of CAD: Continue Imdur , Cardizem , aspirin , Lipitor . History of malignant melanoma of head and neck: Follow up with primary oncology as scheduled  Advanced hearing impairment: Continue hearing aids. Acute kidney injury: Resolved  Discharge Instructions  Discharge Instructions     Call MD for:  difficulty breathing, headache or visual disturbances   Complete by: As directed    Call MD for:  extreme fatigue   Complete by: As directed    Call MD for:  hives   Complete by: As directed    Call MD for:  persistant dizziness or light-headedness   Complete by: As directed    Call MD for:  persistant nausea and vomiting   Complete by: As directed    Call MD for:  severe  uncontrolled pain   Complete by: As directed    Call MD for:  temperature >100.4   Complete by: As directed    Diet - low sodium heart healthy   Complete by: As directed    Increase activity slowly    Complete by: As directed    No wound care   Complete by: As directed       Allergies as of 03/04/2024   No Known Allergies      Medication List     STOP taking these medications    cyanocobalamin  1000 MCG tablet   losartan  25 MG tablet Commonly known as: COZAAR        TAKE these medications    acetaminophen  500 MG tablet Commonly known as: TYLENOL  Take 500 mg by mouth every 6 (six) hours as needed for moderate pain (for pain).   aspirin  81 MG chewable tablet Chew 81 mg by mouth daily. What changed: Another medication with the same name was removed. Continue taking this medication, and follow the directions you see here.   atorvastatin  80 MG tablet Commonly known as: LIPITOR  Take 80 mg by mouth every evening.   CALCIUM  + D3 PO Take 1 tablet by mouth in the morning and at bedtime.   cycloSPORINE  0.05 % ophthalmic emulsion Commonly known as: RESTASIS  Place 1 drop into both eyes 2 (two) times daily.   diltiazem  180 MG 24 hr tablet Commonly known as: CARDIZEM  LA Take 180 mg by mouth daily.   furosemide  40 MG tablet Commonly known as: LASIX  Take 1 tablet (40 mg total) by mouth daily. What changed:  medication strength how much to take when to take this reasons to take this   gabapentin  800 MG tablet Commonly known as: NEURONTIN  Take 800 mg by mouth 3 (three) times daily.   GLUCOSE PO Take 1 tablet by mouth daily as needed (Low blood sugar).   isosorbide  mononitrate 30 MG 24 hr tablet Commonly known as: IMDUR  Take 1 tablet (30 mg total) by mouth daily.   Jardiance  25 MG Tabs tablet Generic drug: empagliflozin  Take 25 mg by mouth daily.   Lantus  SoloStar 100 UNIT/ML Solostar Pen Generic drug: insulin  glargine Inject 4-9 Units into the skin See admin instructions. Inject 4-9 units into the skin at bedtime, per sliding scale   lidocaine  5 % Commonly known as: LIDODERM  Place 1 patch onto the skin daily as needed (for pain- on 12 hours/off 12  hours).   pantoprazole  40 MG tablet Commonly known as: PROTONIX  Take 1 tablet (40 mg total) by mouth daily. What changed: when to take this   pioglitazone 45 MG tablet Commonly known as: ACTOS Take 45 mg by mouth daily.   traZODone  100 MG tablet Commonly known as: DESYREL  Take 100 mg by mouth at bedtime.        Contact information for after-discharge care     Destination     [facility] .   Service: Skilled Nursing Contact information: [address] [place]  [address] [phone]                    No Known Allergies  Consultations: Pulmonology   Procedures/Studies: DG CHEST PORT 1 VIEW Result Date: 03/03/2024 CLINICAL DATA:  Shortness of breath, pleural effusion. EXAM: PORTABLE CHEST 1 VIEW COMPARISON:  Feb 29, 2024. FINDINGS: Stable cardiomediastinal silhouette with mild central pulmonary vascular congestion. Probable minimal bibasilar subsegmental atelectasis. Old left rib fracture. IMPRESSION: Stable cardiomediastinal silhouette with mild central pulmonary  vascular congestion. Probable minimal bibasilar subsegmental atelectasis. Aortic Atherosclerosis (ICD10-I70.0). Electronically Signed   By: Rosalene Colon M.D.   On: 03/03/2024 10:53   DG Chest 1 View Result Date: 02/29/2024 CLINICAL DATA:  Status post left-sided thoracentesis. EXAM: CHEST  1 VIEW COMPARISON:  Chest x-Blu 02/27/2024 FINDINGS: The heart is mildly enlarged appears stable. Interval significant decrease in size of the left-sided pleural effusion. Overlying atelectasis noted but no postprocedural pneumothorax. Overall the lungs appear better aerated with resolving pulmonary edema. IMPRESSION: 1. Interval significant decrease in size of left-sided pleural effusion. No postprocedural pneumothorax. 2. Improved aeration of the lungs with resolving pulmonary edema. Electronically Signed   By: Marrian Siva M.D.   On: 02/29/2024 18:23   US  THORACENTESIS ASP PLEURAL SPACE  W/IMG GUIDE Result Date: 02/29/2024 INDICATION: 89 year old male with history of coronary artery disease, Afib, malignant melanoma of the head and neck, and recurrent left-sided pleural effusion. IR was requested for therapeutic left thoracentesis. EXAM: ULTRASOUND GUIDED THERAPEUTIC THORACENTESIS MEDICATIONS: 8 cc of 1% lidocaine  COMPLICATIONS: None immediate. PROCEDURE: An ultrasound guided thoracentesis was thoroughly discussed with the patient and questions answered. The benefits, risks, alternatives and complications were also discussed. The patient understands and wishes to proceed with the procedure. Written consent was obtained. Ultrasound was performed to localize and mark an adequate pocket of fluid in the left chest. The area was then prepped and draped in the normal sterile fashion. 1% Lidocaine  was used for local anesthesia. Under ultrasound guidance a 6 Fr Safe-T-Centesis catheter was introduced. Thoracentesis was performed. The catheter was removed and a dressing applied. FINDINGS: A total of approximately 1.8 L of clear, amber colored pleural fluid was removed. IMPRESSION: Successful ultrasound guided left thoracentesis yielding 1.8 L of pleural fluid. Procedure performed by Lambert Pillion, PA-C Electronically Signed   By: Erica Hau M.D.   On: 02/29/2024 15:01   DG CHEST PORT 1 VIEW Result Date: 02/27/2024 CLINICAL DATA:  Acute hypoxic respiratory failure EXAM: PORTABLE CHEST 1 VIEW COMPARISON:  Chest radiograph dated 02/25/2024 FINDINGS: Normal lung volumes. Increased diffuse interstitial opacities and dense left retrocardiac opacity. Increased moderate left pleural effusion. No pneumothorax. Left heart border is obscured. No acute osseous abnormality. IMPRESSION: Findings of increased pulmonary edema and moderate left pleural effusion. Electronically Signed   By: Limin  Xu M.D.   On: 02/27/2024 17:33   DG Chest 1 View Result Date: 02/25/2024 CLINICAL DATA:  Status post thoracentesis.  EXAM: CHEST  1 VIEW COMPARISON:  Same-day radiograph at 12:19 p.m. FINDINGS: Stable cardiomediastinal silhouette. Aortic atherosclerosis. Decreased size of a large left pleural effusion with improved aeration in the left upper lung. No definite pneumothorax. Left lung opacities with persistent left lower lobe volume loss/collapse. Similar small right pleural effusion with right basilar opacities. IMPRESSION: 1. Decreased size of a large left pleural effusion with improved aeration in the left upper lung. Persistent left lung opacities and lower lobe atelectasis/collapse. No definite pneumothorax. 2. Small right pleural effusion with right basilar opacities. Electronically Signed   By: Mannie Seek M.D.   On: 02/25/2024 17:15   US  THORACENTESIS ASP PLEURAL SPACE W/IMG GUIDE Result Date: 02/25/2024 INDICATION: Patient with history of coronary artery disease, atrial fibrillation, malignant melanoma of head and neck, dyspnea, left pleural effusion; request received for diagnostic and therapeutic left thoracentesis. EXAM: ULTRASOUND GUIDED DIAGNOSTIC AND THERAPEUTIC LEFT THORACENTESIS MEDICATIONS: 8 mL 1% lidocaine  COMPLICATIONS: None immediate. PROCEDURE: An ultrasound guided thoracentesis was thoroughly discussed with the patient and questions answered. The benefits, risks, alternatives  and complications were also discussed. The patient understands and wishes to proceed with the procedure. Written consent was obtained. Ultrasound was performed to localize and mark an adequate pocket of fluid in the left chest. The area was then prepped and draped in the normal sterile fashion. 1% Lidocaine  was used for local anesthesia. Under ultrasound guidance a 6 Fr Safe-T-Centesis catheter was introduced. Thoracentesis was performed. The catheter was removed and a dressing applied. FINDINGS: A total of approximately 1.6 liters of yellow fluid was removed. Samples were sent to the laboratory as requested by the clinical team.  Due to patient coughing and this being patient's initial thoracentesis only the above amount of fluid was removed today. IMPRESSION: Successful ultrasound guided diagnostic and therapeutic left thoracentesis yielding 1.6 liters of pleural fluid. Performed by: Wash Hack Electronically Signed   By: Melven Stable.  Shick M.D.   On: 02/25/2024 16:21   CT CHEST WO CONTRAST Result Date: 02/25/2024 CLINICAL DATA:  Pneumonia, pleural effusion EXAM: CT CHEST WITHOUT CONTRAST TECHNIQUE: Multidetector CT imaging of the chest was performed following the standard protocol without IV contrast. RADIATION DOSE REDUCTION: This exam was performed according to the departmental dose-optimization program which includes automated exposure control, adjustment of the mA and/or kV according to patient size and/or use of iterative reconstruction technique. COMPARISON:  August 13, 2023 FINDINGS: Cardiovascular: Choose 1. Extensive calcification of the ascending aorta and coronary arteries without aneurysms. No significant pericardial effusion. Mediastinum/Nodes: No enlarged mediastinal or axillary lymph nodes. Thyroid gland, trachea, and esophagus demonstrate no significant findings. Lungs/Pleura: Large left pleural effusion with significant left lung collapse or consolidation or combination of both with the infiltrates of the left upper lobe that could correlate with pneumonia. Small right pleural effusion with right lower lobe infiltrates and atelectasis. Upper Abdomen: No acute abnormality. Musculoskeletal: No chest wall mass or suspicious bone lesions identified. IMPRESSION: *Large left pleural effusion with significant left lung collapse or consolidation or combination of both with the infiltrates of the left upper lobe that could correlate with pneumonia. *Small right pleural effusion with right lower lobe infiltrates and atelectasis. Electronically Signed   By: Fredrich Jefferson M.D.   On: 02/25/2024 15:53   DG Chest 2 View Result Date:  02/25/2024 CLINICAL DATA:  Shortness of breath for several days. EXAM: CHEST - 2 VIEW COMPARISON:  Feb 20, 2023. FINDINGS: Stable cardiomegaly. Interval development of large left pleural effusion with probable underlying atelectasis or infiltrate. Minimal right basilar subsegmental atelectasis is noted. Bony thorax is unremarkable. IMPRESSION: Interval development of large left pleural effusion as noted above. Electronically Signed   By: Rosalene Colon M.D.   On: 02/25/2024 13:20     Subjective: No acute issues/events overnight   Discharge Exam: Vitals:   03/03/24 1916 03/04/24 0418  BP: 121/62 (!) 144/84  Pulse: 66 66  Resp: 18 18  Temp: 97.6 F (36.4 C) (!) 97.5 F (36.4 C)  SpO2: 98% 96%   Vitals:   03/03/24 0957 03/03/24 1137 03/03/24 1916 03/04/24 0418  BP:  121/61 121/62 (!) 144/84  Pulse:  (!) 59 66 66  Resp:  18 18 18   Temp:  98 F (36.7 C) 97.6 F (36.4 C) (!) 97.5 F (36.4 C)  TempSrc:    Oral  SpO2: 93% 98% 98% 96%  Weight:      Height:        General: Pt is alert, awake, not in acute distress Cardiovascular: RRR, S1/S2 +, no rubs, no gallops Respiratory: CTA bilaterally, no wheezing,  no rhonchi Abdominal: Soft, NT, ND, bowel sounds + Extremities: no edema, no cyanosis    The results of significant diagnostics from this hospitalization (including imaging, microbiology, ancillary and laboratory) are listed below for reference.     Microbiology: Recent Results (from the past 240 hours)  Fungus Culture With Stain     Status: None (Preliminary result)   Collection Time: 02/25/24  3:54 PM   Specimen: PATH Cytology Pleural fluid  Result Value Ref Range Status   Fungus Stain Final report  Final    Comment: (NOTE) Performed At: [facility] [address] [place], [place] [id number] Pearlean Botts MD WU:[id number]    Fungus (Mycology) Culture PENDING  Incomplete   Fungal Source PLEURAL  Final    Comment: LEFT SIDE Performed at [facility], [address], [place], [place] [address]   Acid Fast Smear (AFB)     Status: None   Collection Time: 02/25/24  3:54 PM   Specimen: PATH Cytology Pleural fluid  Result Value Ref Range Status   AFB Specimen Processing Direct Inoculation  Final   Acid Fast Smear Negative  Final    Comment: (NOTE) Performed At: [facility] [address] [place], [place] [id number] Pearlean Botts MD NG:[id number]    Source (AFB) PLEURAL  Final    Comment: LEFT SIDE Performed at [facility], [address], [place], [place] [address]   Culture, body fluid w Gram Stain-bottle     Status: None   Collection Time: 02/25/24  3:54 PM   Specimen: Fluid  Result Value Ref Range Status   Specimen Description FLUID PLEURAL LEFT  Final   Special Requests BOTTLES DRAWN AEROBIC AND ANAEROBIC  Final   Culture   Final    NO GROWTH 5 DAYS Performed at [facility], [address] [place], [place] [address]    Report Status 03/01/2024 FINAL  Final  Gram stain     Status: None   Collection Time: 02/25/24  3:54 PM   Specimen: Fluid  Result Value Ref Range Status   Specimen Description FLUID PLEURAL LEFT  Final   Special Requests NONE  Final   Gram Stain   Final    WBC PRESENT, PREDOMINANTLY MONONUCLEAR NO ORGANISMS SEEN CYTOSPIN SMEAR Performed at [facility], [address] [place], [place] [address]    Report Status 02/25/2024 FINAL  Final  Fungus Culture Result     Status: None   Collection Time: 02/25/24  3:54 PM  Result Value Ref Range Status   Result 1 Comment  Final    Comment: (NOTE) KOH/Calcofluor preparation:  no fungus observed. Performed At: [facility] [address] [place], [place] [id number] Pearlean Botts MD OV:[id number]      Labs: BNP (last 3 results) Recent Labs    05/21/23 0614 02/25/24 1206  BNP 729.0* 388.5*   Basic Metabolic Panel: Recent Labs  Lab 02/27/24 0528 02/28/24 0733 03/01/24 0517 03/02/24 0515   NA 142 142 137 137  K 4.1 3.9 3.6 3.5  CL 105 103 98 102  CO2 27 29 30 28   GLUCOSE 220* 193* 219* 218*  BUN 45* 47* 50* 45*  CREATININE 1.63* 1.89* 1.35* 1.09  CALCIUM  8.8* 9.2 8.8* 8.8*  MG  --  2.2 2.0  --   PHOS  --  3.4 2.8  --    Liver Function Tests: No results for input(s): "AST", "ALT", "ALKPHOS", "BILITOT", "PROT", "ALBUMIN" in the last 168 hours.  No results for input(s): "LIPASE", "AMYLASE" in the last 168 hours. No results for input(s): "AMMONIA" in the last 168 hours. CBC: Recent Labs  Lab 02/27/24 0528 02/28/24 0733 03/01/24 0517 03/02/24 0515  WBC 9.5 9.5 8.6 8.1  HGB 9.6* 10.1* 9.4* 9.6*  HCT 31.7* 34.0* 30.4* 31.4*  MCV 90.8 91.6 88.4 90.0  PLT 197 199 200 212   Cardiac Enzymes: No results for input(s): "CKTOTAL", "CKMB", "CKMBINDEX", "TROPONINI" in the last 168 hours. BNP: Invalid input(s): "POCBNP" CBG: Recent Labs  Lab 03/02/24 2038 03/03/24 0742 03/03/24 1138 03/03/24 1629 03/03/24 2052  GLUCAP 226* 238* 304* 263* 261*   D-Dimer No results for input(s): "DDIMER" in the last 72 hours. Hgb A1c No results for input(s): "HGBA1C" in the last 72 hours. Lipid Profile No results for input(s): "CHOL", "HDL", "LDLCALC", "TRIG", "CHOLHDL", "LDLDIRECT" in the last 72 hours. Thyroid function studies No results for input(s): "TSH", "T4TOTAL", "T3FREE", "THYROIDAB" in the last 72 hours.  Invalid input(s): "FREET3" Anemia work up No results for input(s): "VITAMINB12", "FOLATE", "FERRITIN", "TIBC", "IRON", "RETICCTPCT" in the last 72 hours. Urinalysis    Component Value Date/Time   COLORURINE YELLOW 02/20/2023 1229   APPEARANCEUR CLEAR 02/20/2023 1229   LABSPEC 1.016 02/20/2023 1229   PHURINE 5.0 02/20/2023 1229   GLUCOSEU >=500 (A) 02/20/2023 1229   HGBUR SMALL (A) 02/20/2023 1229   BILIRUBINUR NEGATIVE 02/20/2023 1229   KETONESUR 20 (A) 02/20/2023 1229   PROTEINUR 100 (A) 02/20/2023 1229   UROBILINOGEN 0.2 11/10/2012 1432   NITRITE NEGATIVE  02/20/2023 1229   LEUKOCYTESUR MODERATE (A) 02/20/2023 1229   Sepsis Labs Recent Labs  Lab 02/27/24 0528 02/28/24 0733 03/01/24 0517 03/02/24 0515  WBC 9.5 9.5 8.6 8.1   Microbiology Recent Results (from the past 240 hours)  Fungus Culture With Stain     Status: None (Preliminary result)   Collection Time: 02/25/24  3:54 PM   Specimen: PATH Cytology Pleural fluid  Result Value Ref Range Status   Fungus Stain Final report  Final    Comment: (NOTE) Performed At: [facility] [address] [place], [place] [id number] Pearlean Botts MD WU:[id number]    Fungus (Mycology) Culture PENDING  Incomplete   Fungal Source PLEURAL  Final    Comment: LEFT SIDE Performed at [facility], [address], [place], [place] [address]   Acid Fast Smear (AFB)     Status: None   Collection Time: 02/25/24  3:54 PM   Specimen: PATH Cytology Pleural fluid  Result Value Ref Range Status   AFB Specimen Processing Direct Inoculation  Final   Acid Fast Smear Negative  Final    Comment: (NOTE) Performed At: [facility] [address] [place], [place] [id number] Pearlean Botts MD NG:[id number]    Source (AFB) PLEURAL  Final    Comment: LEFT SIDE Performed at [facility], [address], [place], [place] [address]   Culture, body fluid w Gram Stain-bottle     Status: None   Collection Time: 02/25/24  3:54 PM   Specimen: Fluid  Result Value Ref Range Status   Specimen Description FLUID PLEURAL LEFT  Final   Special Requests BOTTLES DRAWN AEROBIC AND ANAEROBIC  Final   Culture   Final    NO GROWTH 5 DAYS Performed at [facility], [address] [place], [place] [address]    Report Status 03/01/2024 FINAL  Final  Gram stain     Status: None   Collection Time: 02/25/24  3:54 PM   Specimen: Fluid  Result Value Ref Range Status   Specimen Description FLUID PLEURAL LEFT  Final   Special Requests NONE  Final   Gram Stain   Final     WBC PRESENT, PREDOMINANTLY MONONUCLEAR NO ORGANISMS SEEN CYTOSPIN SMEAR Performed at [facility], [address], [place], [place] [address]    Report Status 02/25/2024 FINAL  Final  Fungus Culture Result     Status: None   Collection Time: 02/25/24  3:54 PM  Result Value Ref Range Status   Result 1 Comment  Final    Comment: (NOTE) KOH/Calcofluor preparation:  no fungus observed. Performed At: [facility] [address] [place], [place] [id number] Pearlean Botts MD XB:[id number]      Time coordinating discharge: Over 30 minutes  SIGNED:   Haydee Lipa, DO [facility] 03/04/2024, 7:46 AM Pager   If 7PM-7AM, please contact night-coverage [email or web address]

## 2024-03-03 NOTE — Progress Notes (Signed)
 NAME:  [name], MRN:  [id number], DOB:  1930/04/28, LOS: 7 ADMISSION DATE:  02/25/2024, CONSULTATION DATE: 5/12 REFERRING MD:  Elsworth Halt, CHIEF COMPLAINT: Recurrent pleural effusion  History of Present Illness:  89 year old male patient with extensive history as mentioned below admitted initially on 5/12 with several day h/o progressive shortness of breath. On ER arrival sats 75% room air. CT chest showed bilateral airspace disease and large left effusion.  He was placed on supplemental oxygen, and IR was consulted for left thoracentesis initially performed on 5/7 at that time 1.6 L was removed, fluid was yellow appearing, cultures and cytology negative. Unfortunately LDH not sent.  Initially had felt better.  Oxygen was weaned.  In She was treated with ongoing IV diuresis.  On 5/11 continued to have supplemental oxygen needs at 4 L a repeat chest x-Isaak was obtained 2 days prior which showed mixed picture of both pulmonary edema and pleural effusions so she was again sent for thoracocentesis this time drained on 5/12 yielding 1.8 L of fluid unfortunately no diagnostics were sent from the sample  The patient is now ready for discharge and pulmonary has been asked to evaluate to comment on recurrent pleural effusion  Pertinent  Medical History  Hypertension hyperlipidemia type 2 diabetes hearing difficulty history of atrial flutter not on anticoagulation recurrent falls, aortic insufficiency  Significant Hospital Events: Including procedures, antibiotic start and stop dates in addition to other pertinent events   5/7 admitted with hypoxic respiratory failure, pulmonary edema and left pleural effusion underwent large-volume left pleural effusion fluid nondiagnostic, was negative for culture and cytology but inflammatory indices not completely sent 5/7 through 5/11 still hypoxic requiring supplemental oxygen chest x-Pernell postthoracentesis performed earlier in hospitalization continued to show evidence of  edema and left effusion and therefore repeat ultrasound-guided thoracentesis performed no sample sent drained 1.8 L   Interim History / Subjective:  Denies complaints this morning.   Objective    Blood pressure (!) 145/70, pulse 75, temperature 98.4 F (36.9 C), resp. rate 18, height 6' (1.829 m), weight 79.5 kg, SpO2 93%.        Intake/Output Summary (Last 24 hours) at 03/03/2024 0733 Last data filed at 03/03/2024 0640 Gross per 24 hour  Intake 360 ml  Output 1250 ml  Net -890 ml   Filed Weights   02/26/24 0521  Weight: 79.5 kg    Examination: General: chronically ill, frail appearing man sitting up in bed in NAD HENT: ectopion, hard of hearing despite hearing aids in place Lungs: breathing comfortably on RA, reduced basilar breath sounds, no wheezing or rhonchi Cardiovascular: S1S2, RRR Abdomen: nondistneded Extremities: bruising, no peripheral edema Neuro: awake, answering the questions he can hear. Globally weak.    CXR personally reviewed> no significant reaccumulation of left effusion, pulmonary edema.    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 Peripheral edema seems not to be a good estimate of his pulmonary edema. -does not need  a thora currently; with diuretics he may be able to keep this from reaccumulating, which means it is most likely related to  his heart failure and valve disease. If it reaccumulates, he likely will need it drained again and checked for full pleural studies (LDH, protein, glucose, cell count with diff, cytology, culture)  We will sign off okay for transfer to skilled nursing facility.    Best Practice (right click and "Reselect all SmartList Selections" daily)    Labs   CBC: Recent Labs  Lab 02/26/24 0545 02/27/24 0528 02/28/24 0733 03/01/24 0517 03/02/24 0515  WBC 9.8 9.5 9.5 8.6 8.1  HGB 10.3* 9.6* 10.1* 9.4* 9.6*  HCT 35.3* 31.7* 34.0* 30.4* 31.4*  MCV 92.2 90.8 91.6 88.4 90.0  PLT 259 197 199 200 212    Basic Metabolic Panel: Recent Labs  Lab 02/26/24 0715 02/27/24 0528 02/28/24 0733 03/01/24 0517 03/02/24 0515  NA 143 142 142 137 137  K 3.8 4.1 3.9 3.6 3.5  CL 105 105 103 98 102  CO2 29 27 29 30 28   GLUCOSE 144* 220* 193* 219* 218*  BUN 31* 45* 47* 50* 45*  CREATININE 1.41* 1.63* 1.89* 1.35* 1.09  CALCIUM  9.3 8.8* 9.2 8.8* 8.8*  MG 2.2  --  2.2 2.0  --   PHOS 4.4  --  3.4 2.8  --    GFR: Estimated Creatinine Clearance: 47.5 mL/min (by C-G formula based on SCr of 1.09 mg/dL). Recent Labs  Lab 02/27/24 0528 02/28/24 0733 02/28/24 1211 03/01/24 0517 03/02/24 0515  PROCALCITON  --   --  <0.10  --   --   WBC 9.5 9.5  --  8.6 8.1    Liver Function Tests: Recent Labs  Lab 02/26/24 0715  AST 14*  ALT 13  ALKPHOS 100  BILITOT 1.2  PROT 6.4*  ALBUMIN 3.0*     Critical care time: NA    Joesph Mussel, DO 03/03/24 9:20 AM [facility]  For contact information, see Amion. If no response to pager, please call PCCM consult pager. After hours, 7PM- 7AM, please call Elink.

## 2024-03-03 NOTE — Care Management Important Message (Signed)
 Important Message  Patient Details  Name: [name] MRN: [id number] Date of Birth: [birth year]   Important Message Given:        [name] 03/03/2024, 3:55 PM

## 2024-03-03 NOTE — Plan of Care (Signed)
  Problem: Education: Goal: Ability to describe self-care measures that may prevent or decrease complications (Diabetes Survival Skills Education) will improve Outcome: Progressing Goal: Individualized Educational Video(s) Outcome: Progressing   Problem: Health Behavior/Discharge Planning: Goal: Ability to identify and utilize available resources and services will improve Outcome: Progressing   Problem: Nutritional: Goal: Maintenance of adequate nutrition will improve Outcome: Progressing   Problem: Tissue Perfusion: Goal: Adequacy of tissue perfusion will improve Outcome: Progressing   Problem: Health Behavior/Discharge Planning: Goal: Ability to manage health-related needs will improve Outcome: Progressing   Problem: Clinical Measurements: Goal: Ability to maintain clinical measurements within normal limits will improve Outcome: Progressing Goal: Respiratory complications will improve Outcome: Progressing Goal: Cardiovascular complication will be avoided Outcome: Progressing   Problem: Coping: Goal: Level of anxiety will decrease Outcome: Progressing   Problem: Elimination: Goal: Will not experience complications related to urinary retention Outcome: Progressing   Problem: Pain Managment: Goal: General experience of comfort will improve and/or be controlled Outcome: Progressing   Problem: Skin Integrity: Goal: Risk for impaired skin integrity will decrease Outcome: Progressing

## 2024-03-03 NOTE — Progress Notes (Signed)
 Occupational Therapy Treatment Patient Details Name: [name] MRN: [id number] DOB: 01-17-1930 Today's Date: 03/03/2024   History of present illness 89 yr old male with PMH significant for hypertension, hyperlipidemia, type 2 diabetes, hard of hearing, history of atrial flutter, not on anticoagulation due to recurrent falls, aortic insufficiency, presented 02/25/24 with progressive shortness of breath for last few days.  He was hypoxic, requiring up to 4 L.   OT comments  The pt motivated to participate in the session. He was seen for progression of functional activity and ADL participation. He required assist to stand using a RW, then performing grooming in standing at the sink. He was noted to be with intermittent unsteadiness in standing and slight deconditioning. Continue OT plan of care. Patient will benefit from continued inpatient follow up therapy, <3 hours/day.       If plan is discharge home, recommend the following:  Assist for transportation;A little help with walking and/or transfers;Assistance with cooking/housework;A little help with bathing/dressing/bathroom   Equipment Recommendations  BSC/3in1    Recommendations for Other Services      Precautions / Restrictions Precautions Precautions: Fall Restrictions Weight Bearing Restrictions Per Provider Order: No       Mobility Bed Mobility Overal bed mobility: Needs Assistance Bed Mobility: Supine to Sit, Sit to Supine     Supine to sit: Min assist, Used rails, HOB elevated Sit to supine: Min assist        Transfers Overall transfer level: Needs assistance Equipment used: Rolling walker (2 wheels) Transfers: Sit to/from Stand Sit to Stand: Min assist                 Balance     Sitting balance-Leahy Scale: Good         Standing balance comment: CGA to min assist with RW          ADL either performed or assessed with clinical judgement   ADL Overall ADL's : Needs assistance/impaired      Grooming: Standing;Wash/dry hands Grooming Details (indicate cue type and reason): Pt ambulated to and from the bathroom in his room using a RW. He performed grooming in standing at sink level, requiring light steadying assist.             Lower Body Dressing: Minimal assistance;Sitting/lateral leans Lower Body Dressing Details (indicate cue type and reason): for pulling socks up seated EOB                              Communication Communication Factors Affecting Communication: Hearing impaired   Cognition Arousal: Alert Behavior During Therapy: WFL for tasks assessed/performed Cognition: No apparent impairments          Following commands: Intact        Cueing   Cueing Techniques: Gestural cues  Exercises              Pertinent Vitals/ Pain       Pain Assessment Pain Assessment: Faces Pain Score: 0-No pain   Frequency  Min 2X/week        Progress Toward Goals  OT Goals(current goals can now be found in the care plan section)  Progress towards OT goals: Progressing toward goals  Acute Rehab OT Goals OT Goal Formulation: With patient Time For Goal Achievement: 03/11/24 Potential to Achieve Goals: Good  Plan         AM-PAC OT "6 Clicks" Daily Activity     Outcome Measure  Help from another person eating meals?: None Help from another person taking care of personal grooming?: A Little Help from another person toileting, which includes using toliet, bedpan, or urinal?: A Little Help from another person bathing (including washing, rinsing, drying)?: A Lot Help from another person to put on and taking off regular upper body clothing?: A Little Help from another person to put on and taking off regular lower body clothing?: A Lot 6 Click Score: 17    End of Session Equipment Utilized During Treatment: Rolling walker (2 wheels)  OT Visit Diagnosis: Unsteadiness on feet (R26.81);Muscle weakness (generalized) (M62.81);History of falling  (Z91.81)   Activity Tolerance Other (comment) (Fair+ tolerance)   Patient Left in bed;with call bell/phone within reach;with bed alarm set   Nurse Communication Mobility status        Time: 1610-9604 OT Time Calculation (min): 12 min  Charges: OT General Charges $OT Visit: 1 Visit OT Treatments $Self Care/Home Management : 8-22 mins    Sheralyn Dies, OTR/L 03/03/2024, 5:34 PM

## 2024-03-04 DIAGNOSIS — I480 Paroxysmal atrial fibrillation: Secondary | ICD-10-CM | POA: Diagnosis not present

## 2024-03-04 DIAGNOSIS — E1140 Type 2 diabetes mellitus with diabetic neuropathy, unspecified: Secondary | ICD-10-CM | POA: Diagnosis not present

## 2024-03-04 DIAGNOSIS — I509 Heart failure, unspecified: Secondary | ICD-10-CM | POA: Diagnosis not present

## 2024-03-04 DIAGNOSIS — R609 Edema, unspecified: Secondary | ICD-10-CM | POA: Diagnosis not present

## 2024-03-04 DIAGNOSIS — H04123 Dry eye syndrome of bilateral lacrimal glands: Secondary | ICD-10-CM | POA: Diagnosis not present

## 2024-03-04 DIAGNOSIS — R0602 Shortness of breath: Secondary | ICD-10-CM | POA: Diagnosis not present

## 2024-03-04 DIAGNOSIS — R0902 Hypoxemia: Secondary | ICD-10-CM | POA: Diagnosis not present

## 2024-03-04 DIAGNOSIS — I110 Hypertensive heart disease with heart failure: Secondary | ICD-10-CM | POA: Diagnosis not present

## 2024-03-04 DIAGNOSIS — I959 Hypotension, unspecified: Secondary | ICD-10-CM | POA: Diagnosis not present

## 2024-03-04 DIAGNOSIS — J9601 Acute respiratory failure with hypoxia: Secondary | ICD-10-CM | POA: Diagnosis not present

## 2024-03-04 DIAGNOSIS — R053 Chronic cough: Secondary | ICD-10-CM | POA: Diagnosis not present

## 2024-03-04 DIAGNOSIS — E8770 Fluid overload, unspecified: Secondary | ICD-10-CM | POA: Diagnosis not present

## 2024-03-04 DIAGNOSIS — J90 Pleural effusion, not elsewhere classified: Secondary | ICD-10-CM | POA: Diagnosis present

## 2024-03-04 DIAGNOSIS — M549 Dorsalgia, unspecified: Secondary | ICD-10-CM | POA: Diagnosis not present

## 2024-03-04 DIAGNOSIS — I10 Essential (primary) hypertension: Secondary | ICD-10-CM | POA: Diagnosis not present

## 2024-03-04 DIAGNOSIS — E1142 Type 2 diabetes mellitus with diabetic polyneuropathy: Secondary | ICD-10-CM | POA: Diagnosis not present

## 2024-03-04 DIAGNOSIS — G8929 Other chronic pain: Secondary | ICD-10-CM | POA: Diagnosis not present

## 2024-03-04 DIAGNOSIS — I351 Nonrheumatic aortic (valve) insufficiency: Secondary | ICD-10-CM | POA: Diagnosis not present

## 2024-03-04 DIAGNOSIS — R918 Other nonspecific abnormal finding of lung field: Secondary | ICD-10-CM | POA: Diagnosis not present

## 2024-03-04 DIAGNOSIS — R0989 Other specified symptoms and signs involving the circulatory and respiratory systems: Secondary | ICD-10-CM | POA: Diagnosis not present

## 2024-03-04 DIAGNOSIS — Z48813 Encounter for surgical aftercare following surgery on the respiratory system: Secondary | ICD-10-CM | POA: Diagnosis not present

## 2024-03-04 DIAGNOSIS — I2510 Atherosclerotic heart disease of native coronary artery without angina pectoris: Secondary | ICD-10-CM | POA: Diagnosis not present

## 2024-03-04 DIAGNOSIS — R5383 Other fatigue: Secondary | ICD-10-CM | POA: Diagnosis not present

## 2024-03-04 DIAGNOSIS — Z96651 Presence of right artificial knee joint: Secondary | ICD-10-CM | POA: Diagnosis not present

## 2024-03-04 DIAGNOSIS — E1165 Type 2 diabetes mellitus with hyperglycemia: Secondary | ICD-10-CM | POA: Diagnosis not present

## 2024-03-04 DIAGNOSIS — Z87891 Personal history of nicotine dependence: Secondary | ICD-10-CM | POA: Diagnosis not present

## 2024-03-04 DIAGNOSIS — M6281 Muscle weakness (generalized): Secondary | ICD-10-CM | POA: Diagnosis not present

## 2024-03-04 DIAGNOSIS — R41841 Cognitive communication deficit: Secondary | ICD-10-CM | POA: Diagnosis not present

## 2024-03-04 DIAGNOSIS — Z7401 Bed confinement status: Secondary | ICD-10-CM | POA: Diagnosis not present

## 2024-03-04 DIAGNOSIS — Z7982 Long term (current) use of aspirin: Secondary | ICD-10-CM | POA: Diagnosis not present

## 2024-03-04 DIAGNOSIS — K5900 Constipation, unspecified: Secondary | ICD-10-CM | POA: Diagnosis not present

## 2024-03-04 DIAGNOSIS — R1312 Dysphagia, oropharyngeal phase: Secondary | ICD-10-CM | POA: Diagnosis not present

## 2024-03-04 DIAGNOSIS — J811 Chronic pulmonary edema: Secondary | ICD-10-CM | POA: Diagnosis not present

## 2024-03-04 LAB — GLUCOSE, CAPILLARY: Glucose-Capillary: 318 mg/dL — ABNORMAL HIGH (ref 70–99)

## 2024-03-04 MED ORDER — FUROSEMIDE 40 MG PO TABS: 40.0000 mg | ORAL_TABLET | Freq: Every day | ORAL | 0 refills | Status: AC

## 2024-03-04 NOTE — Plan of Care (Signed)

## 2024-03-04 NOTE — TOC Transition Note (Signed)
 Transition of Care [facility]) - Discharge Note   Patient Details  Name: [name] MRN: [id number] Date of Birth: 1930/03/06  [facility]) CM/SW Contact:  Tessie Fila, RN Phone Number: 03/04/2024, 9:55 AM   Clinical Narrative:    Pt being discharged to [facility]. Spoke with pt daughter Abhijit Nolting at [phone], to inform her pt ready for DC to SNF. Asked pt daughter if she would be able to transport pt by private vehicle and she states that her and the pt spouse are unable to provide transportation, and  okay with pt transporting by ambulance. Explained to pt daughter that ambulance transport may not be covered by insurance and she states understanding. Pt daughter and wife agreeable to DC plans. [facility] called at [phone]. Nurse given report to call at [phone] to rm 102. DC packet is at RN station with signed DNR form inside. There are no other TOC needs at this time.    Final next level of care: Skilled Nursing Facility Barriers to Discharge: No Barriers Identified   Patient Goals and CMS Choice Patient states their goals for this hospitalization and ongoing recovery are:: To get rehab at SNF, to return home CMS Medicare.gov Compare Post Acute Care list provided to:: Patient Represenative (must comment) Choice offered to / list presented to : [facility].provided to:: Adult Children    Discharge Placement              Patient chooses bed at: [facility] Patient to be transferred to facility by: PT[facility] Name of family member notified: Keimarion Quaas pt daughter at  [phone] Patient and family notified of of transfer: 03/04/24  Discharge Plan and Services Additional resources added to the After Visit Summary for   In-house Referral: Clinical Social Work Discharge Planning Services: NA Post Acute Care Choice: Skilled Nursing Facility          DME Arranged: N/A DME Agency: NA       HH Arranged: NA HH  Agency: NA        Social Drivers of Health (SDOH) Interventions SDOH Screenings   Food Insecurity: No Food Insecurity (05/21/2023)  Housing: Low Risk  (05/21/2023)  Transportation Needs: No Transportation Needs (05/21/2023)  Utilities: Not At Risk (05/21/2023)  Tobacco Use: Medium Risk (02/26/2024)     Readmission Risk Interventions    02/26/2024   10:35 AM 02/22/2023    1:30 PM  Readmission Risk Prevention Plan  Transportation Screening Complete Complete  PCP or Specialist Appt within 5-7 Days Complete   PCP or Specialist Appt within 3-5 Days  Complete  Home Care Screening Complete   Medication Review (RN CM) Complete   HRI or Home Care Consult  Complete  Social Work Consult for Recovery Care Planning/Counseling  Complete  Palliative Care Screening  Not Applicable  Medication Review [employer])  Complete

## 2024-03-04 NOTE — Progress Notes (Signed)
 Patient report given to [facility], keep PIV in place as per [facility]. All personal belongings send with the patient.

## 2024-03-05 DIAGNOSIS — E1165 Type 2 diabetes mellitus with hyperglycemia: Secondary | ICD-10-CM | POA: Diagnosis not present

## 2024-03-08 DIAGNOSIS — I10 Essential (primary) hypertension: Secondary | ICD-10-CM | POA: Diagnosis not present

## 2024-03-08 DIAGNOSIS — E1165 Type 2 diabetes mellitus with hyperglycemia: Secondary | ICD-10-CM | POA: Diagnosis not present

## 2024-03-08 DIAGNOSIS — K5900 Constipation, unspecified: Secondary | ICD-10-CM | POA: Diagnosis not present

## 2024-03-10 DIAGNOSIS — H04123 Dry eye syndrome of bilateral lacrimal glands: Secondary | ICD-10-CM | POA: Diagnosis not present

## 2024-03-12 DIAGNOSIS — H04123 Dry eye syndrome of bilateral lacrimal glands: Secondary | ICD-10-CM | POA: Diagnosis not present

## 2024-03-15 MED ORDER — ISOSORBIDE MONONITRATE ER 30 MG PO TB24: 30.0000 mg | ORAL_TABLET | Freq: Every day | ORAL | 0 refills | Status: AC

## 2024-03-15 NOTE — Telephone Encounter (Signed)
 Rx 90 tab and send refills to PCP Denyce Flank. Please  attempt to make an appointment routine for CHF management with anyone

## 2024-03-15 NOTE — Addendum Note (Signed)
 Addended by: Federico Hopkins on: 03/15/2024 10:17 AM   Modules accepted: Orders

## 2024-03-17 DIAGNOSIS — E8770 Fluid overload, unspecified: Secondary | ICD-10-CM | POA: Diagnosis not present

## 2024-03-18 ENCOUNTER — Other Ambulatory Visit: Payer: Self-pay

## 2024-03-18 ENCOUNTER — Emergency Department (HOSPITAL_COMMUNITY)

## 2024-03-18 ENCOUNTER — Emergency Department (HOSPITAL_COMMUNITY)
Admission: EM | Admit: 2024-03-18 | Discharge: 2024-03-18 | Disposition: A | Discharge status: Home / Self Care | Attending: Emergency Medicine | Admitting: Emergency Medicine

## 2024-03-18 ENCOUNTER — Encounter (HOSPITAL_COMMUNITY): Payer: Self-pay

## 2024-03-18 DIAGNOSIS — I2510 Atherosclerotic heart disease of native coronary artery without angina pectoris: Secondary | ICD-10-CM | POA: Insufficient documentation

## 2024-03-18 DIAGNOSIS — E1142 Type 2 diabetes mellitus with diabetic polyneuropathy: Secondary | ICD-10-CM | POA: Insufficient documentation

## 2024-03-18 DIAGNOSIS — I509 Heart failure, unspecified: Secondary | ICD-10-CM | POA: Insufficient documentation

## 2024-03-18 DIAGNOSIS — J90 Pleural effusion, not elsewhere classified: Secondary | ICD-10-CM | POA: Insufficient documentation

## 2024-03-18 DIAGNOSIS — I110 Hypertensive heart disease with heart failure: Secondary | ICD-10-CM | POA: Diagnosis not present

## 2024-03-18 DIAGNOSIS — Z87891 Personal history of nicotine dependence: Secondary | ICD-10-CM | POA: Diagnosis not present

## 2024-03-18 DIAGNOSIS — Z96651 Presence of right artificial knee joint: Secondary | ICD-10-CM | POA: Diagnosis not present

## 2024-03-18 DIAGNOSIS — Z7982 Long term (current) use of aspirin: Secondary | ICD-10-CM | POA: Diagnosis not present

## 2024-03-18 DIAGNOSIS — J811 Chronic pulmonary edema: Secondary | ICD-10-CM | POA: Diagnosis not present

## 2024-03-18 DIAGNOSIS — R0989 Other specified symptoms and signs involving the circulatory and respiratory systems: Secondary | ICD-10-CM | POA: Diagnosis not present

## 2024-03-18 DIAGNOSIS — E8770 Fluid overload, unspecified: Secondary | ICD-10-CM | POA: Diagnosis not present

## 2024-03-18 DIAGNOSIS — R918 Other nonspecific abnormal finding of lung field: Secondary | ICD-10-CM | POA: Diagnosis not present

## 2024-03-18 LAB — CBC WITH DIFFERENTIAL/PLATELET
Abs Immature Granulocytes: 0.03 10*3/uL (ref 0.00–0.07)
Basophils Absolute: 0.1 10*3/uL (ref 0.0–0.1)
Basophils Relative: 1 %
Eosinophils Absolute: 0.3 10*3/uL (ref 0.0–0.5)
Eosinophils Relative: 4 %
HCT: 28.7 % — ABNORMAL LOW (ref 39.0–52.0)
Hemoglobin: 8.7 g/dL — ABNORMAL LOW (ref 13.0–17.0)
Immature Granulocytes: 0 %
Lymphocytes Relative: 13 %
Lymphs Abs: 0.9 10*3/uL (ref 0.7–4.0)
MCH: 26.4 pg (ref 26.0–34.0)
MCHC: 30.3 g/dL (ref 30.0–36.0)
MCV: 87 fL (ref 80.0–100.0)
Monocytes Absolute: 0.8 10*3/uL (ref 0.1–1.0)
Monocytes Relative: 12 %
Neutro Abs: 4.8 10*3/uL (ref 1.7–7.7)
Neutrophils Relative %: 70 %
Platelets: 269 10*3/uL (ref 150–400)
RBC: 3.3 MIL/uL — ABNORMAL LOW (ref 4.22–5.81)
RDW: 19 % — ABNORMAL HIGH (ref 11.5–15.5)
WBC: 6.9 10*3/uL (ref 4.0–10.5)
nRBC: 0 % (ref 0.0–0.2)

## 2024-03-18 LAB — BASIC METABOLIC PANEL WITH GFR
Anion gap: 10 (ref 5–15)
BUN: 38 mg/dL — ABNORMAL HIGH (ref 8–23)
CO2: 26 mmol/L (ref 22–32)
Calcium: 8.9 mg/dL (ref 8.9–10.3)
Chloride: 101 mmol/L (ref 98–111)
Creatinine, Ser: 1.53 mg/dL — ABNORMAL HIGH (ref 0.61–1.24)
GFR, Estimated: 42 mL/min — ABNORMAL LOW (ref >60)
Glucose, Bld: 230 mg/dL — ABNORMAL HIGH (ref 70–99)
Potassium: 3.9 mmol/L (ref 3.5–5.1)
Sodium: 137 mmol/L (ref 135–145)

## 2024-03-18 LAB — BRAIN NATRIURETIC PEPTIDE: B Natriuretic Peptide: 308.2 pg/mL — ABNORMAL HIGH (ref 0.0–100.0)

## 2024-03-18 MED ORDER — POTASSIUM CHLORIDE CRYS ER 20 MEQ PO TBCR
40.0000 meq | EXTENDED_RELEASE_TABLET | Freq: Once | ORAL | Status: AC
  Administered 2024-03-18: 40 meq via ORAL
  Filled 2024-03-18: qty 2

## 2024-03-18 MED ORDER — POTASSIUM CHLORIDE ER 10 MEQ PO TBCR: 10.0000 meq | EXTENDED_RELEASE_TABLET | Freq: Every day | ORAL | 0 refills | Status: AC

## 2024-03-18 MED ORDER — FUROSEMIDE 40 MG PO TABS: 80.0000 mg | ORAL_TABLET | Freq: Every day | ORAL | 0 refills | Status: AC

## 2024-03-18 NOTE — ED Triage Notes (Addendum)
 BIB EMS from [facility] in [place] for abnormal labs. Pt was set to be d/c tomorrow from the facility and they did labs and his hemoglobin was 9.9. His legs are also swollen, pt states they have been like this since he has been there and it is worsening.

## 2024-03-18 NOTE — ED Provider Notes (Signed)
 [name] Provider Note  CSN: [id number] Arrival date & time: 03/18/24 1407  Chief Complaint(s) Leg Swelling  HPI [name] is a 89 y.o. male here today for lower extremity edema.  Patient has a past history of A-fib, heart failure.  He was recently discharged from our facility on 5/15 to a SNF.  During his hospitalization, patient required a thoracentesis for a large right left-sided pleural effusion.  Patient was discharged with furosemide .  Patient ambulates with a walker.  He has not had any increased shortness of breath or oxygen requirements.   Past Medical History Past Medical History:  Diagnosis Date   Anemia    related to frequent nosebleeds-right nostril.   Aortic insufficiency    a. mild by echo 2013.   Arthritis    arthritis -back knees, most joints   Atrial flutter (HCC)    a. per report in 2013.   Breathing difficulty 11-10-12   difficult to breath if laying posteriorly, right nare difficulty   Carotid artery disease (HCC)    a. Remote R CEA in the 1990s per pt.   Change in hearing    Diabetes mellitus age 5   Diabetic peripheral neuropathy associated with type 2 diabetes mellitus (HCC) 07/08/2022   Dysrhythmia 07/2012   hx. Atrial Flutter x1, converted spontaneously-no ploblems.    H/O hiatal hernia    HOH (hard of hearing) 11-10-12   bilaterally   Hyperlipidemia    Hypertension    Ulcer 1995   bleeding ulcer, none since   Patient Active Problem List   Diagnosis Date Noted   Acute hypoxic respiratory failure (HCC) 02/25/2024   Acute upper GI bleeding 05/21/2023   GI bleed 05/20/2023   Pneumonia of right upper lobe due to infectious organism 02/21/2023   Coronary artery disease involving native coronary artery of native heart 02/21/2023   Toxic metabolic encephalopathy 02/20/2023   Pulmonary vascular congestion 02/20/2023   Actinic keratosis 07/08/2022   Arrhythmia 07/08/2022   Atrial flutter (HCC)  07/08/2022   Basal cell carcinoma of scalp and skin of neck 07/08/2022   Diabetic peripheral neuropathy associated with type 2 diabetes mellitus (HCC) 07/08/2022   Hypertrophy of prostate with urinary obstruction and other lower urinary tract symptoms (LUTS) 07/08/2022   Inflamed seborrheic keratosis 07/08/2022   Insomnia 07/08/2022   Malignant melanoma of head and neck (HCC) 07/08/2022   Osteoporosis 07/08/2022   Pain in left knee 07/08/2022   Type 2 (non-insulin  dependent type) or unspecified type diabetes mellitus with neurological manifestations, uncontrolled 07/08/2022   Venous (peripheral) insufficiency 07/08/2022   Uncontrolled type 2 diabetes mellitus with hyperglycemia, with long-term current use of insulin  (HCC) 01/20/2022   Chest pain 08/01/2021   Gastritis 06/08/2019   Pancreatic lesion 06/07/2019   AF (paroxysmal atrial fibrillation) (HCC) 06/06/2019   Dehydration, mild 06/06/2019   Melanoma in situ of neck (HCC) 01/18/2016   Open angle with borderline findings and low glaucoma risk in both eyes 07/21/2014   Seasonal allergies 02/14/2014   H/O carotid stenosis 01/21/2014   Arthritis of left knee 01/05/2014   Essential hypertension 11/05/2013   Hyperlipidemia 11/05/2013   Diabetes (HCC) 11/05/2013   Edema 09/07/2013   Peripheral neuropathy 09/07/2013   Aftercare following surgery of the circulatory system, NEC 06/30/2013   Occlusion and stenosis of carotid artery without mention of cerebral infarction 06/30/2012   Home Medication(s) Prior to Admission medications   Medication Sig Start Date End Date Taking? Authorizing Provider  furosemide  (LASIX ) 40 MG tablet Take 2 tablets (80 mg total) by mouth daily for 7 days. 03/18/24 03/25/24 Yes Afton Horse T, DO  potassium chloride  (KLOR-CON ) 10 MEQ tablet Take 1 tablet (10 mEq total) by mouth daily for 5 days. 03/18/24 03/23/24 Yes Afton Horse T, DO  acetaminophen  (TYLENOL ) 500 MG tablet Take 500 mg by mouth every 6 (six)  hours as needed for moderate pain (for pain).    [provider]  aspirin  81 MG chewable tablet Chew 81 mg by mouth daily.    [provider]  atorvastatin  (LIPITOR ) 80 MG tablet Take 80 mg by mouth every evening.     [provider]  Calcium  Carb-Cholecalciferol (CALCIUM  + D3 PO) Take 1 tablet by mouth in the morning and at bedtime.    [provider]  cycloSPORINE  (RESTASIS ) 0.05 % ophthalmic emulsion Place 1 drop into both eyes 2 (two) times daily.    [provider]  Dextrose , Diabetic Use, (GLUCOSE PO) Take 1 tablet by mouth daily as needed (Low blood sugar).    [provider]  diltiazem  (CARDIZEM  LA) 180 MG 24 hr tablet Take 180 mg by mouth daily. 04/29/22   [provider]  furosemide  (LASIX ) 40 MG tablet Take 1 tablet (40 mg total) by mouth daily. 03/04/24   Haydee Lipa, MD  gabapentin  (NEURONTIN ) 800 MG tablet Take 800 mg by mouth 3 (three) times daily. 08/01/21   [provider]  isosorbide  mononitrate (IMDUR ) 30 MG 24 hr tablet Take 1 tablet (30 mg total) by mouth daily. 03/15/24   Knox Perl, MD  JARDIANCE  25 MG TABS tablet Take 25 mg by mouth daily.    [provider]  LANTUS  SOLOSTAR 100 UNIT/ML Solostar Pen Inject 4-9 Units into the skin See admin instructions. Inject 4-9 units into the skin at bedtime, per sliding scale 03/24/23   [provider]  lidocaine  (LIDODERM ) 5 % Place 1 patch onto the skin daily as needed (for pain- on 12 hours/off 12 hours). 08/07/21   [provider]  pantoprazole  (PROTONIX ) 40 MG tablet Take 1 tablet (40 mg total) by mouth daily. Patient taking differently: Take 40 mg by mouth daily before breakfast. 05/23/23 02/25/24  Ghimire, Kuber, MD  pioglitazone (ACTOS) 45 MG tablet Take 45 mg by mouth daily. 05/06/23   [provider]  traZODone  (DESYREL ) 100 MG tablet Take 100 mg by mouth at bedtime.    [provider]                                                                                                                                     Past Surgical History Past Surgical History:  Procedure Laterality Date   CAROTID DUPLEX  06/30/2012   PATENT RIGHT CAROTID ENDARTERECTOMY. 1%-39% LEFT ICA. STABLE   CAROTID ENDARTERECTOMY  11/242008   right with Dacron patch angioplasty   CATARACT EXTRACTION  1998 bilateral  done 3 months apart   CHOLECYSTECTOMY N/A 06/11/2019   Procedure: LAPAROSCOPIC CHOLECYSTECTOMY;  Surgeon: Sim Dryer, MD;  Location: [facility] OR;  Service: General;  Laterality: N/A;   ESOPHAGOGASTRODUODENOSCOPY (EGD) WITH PROPOFOL  N/A 06/08/2019   Procedure: ESOPHAGOGASTRODUODENOSCOPY (EGD) WITH PROPOFOL ;  Surgeon: Baldo Bonds, MD;  Location: [facility] ENDOSCOPY;  Service: Endoscopy;  Laterality: N/A;   ESOPHAGOGASTRODUODENOSCOPY (EGD) WITH PROPOFOL  N/A 05/22/2023   Procedure: ESOPHAGOGASTRODUODENOSCOPY (EGD) WITH PROPOFOL ;  Surgeon: Genell Ken, MD;  Location: [facility] ENDOSCOPY;  Service: Gastroenterology;  Laterality: N/A;   EYE SURGERY     HEMOSTASIS CLIP PLACEMENT  05/22/2023   Procedure: HEMOSTASIS CLIP PLACEMENT;  Surgeon: Genell Ken, MD;  Location: [facility] ENDOSCOPY;  Service: Gastroenterology;;   JOINT REPLACEMENT Right 11-18-12   KNEE BURSECTOMY  11/18/2012   Procedure: KNEE BURSECTOMY;  Surgeon: Florencia Hunter, MD;  Location: [facility] ORS;  Service: Orthopedics;  Laterality: Right;   NM MYOCAR MULTIPLE W/SPECT  10/08/2012   NORMAL STRESS NUCLEAR STUDY.   POLYPECTOMY  05/22/2023   Procedure: POLYPECTOMY;  Surgeon: Genell Ken, MD;  Location: [facility] END[facility];  Service: Gastroenterology;;   TOTAL KNEE ARTHROPLASTY  11/18/2012   Procedure: TOTAL KNEE ARTHROPLASTY;  Surgeon: Florencia Hunter, MD;  Location: [facility] ORS;  Service: Orthopedics;  Laterality: Right;   TRANSTHORACIC ECHOCARDIOGRAM  10/08/2012   MODERATE LVH. MODERATE CONCENTRIC HYPERTROPHY.EF 65 TO 70%. GRADE 1 DYSTOLIC DYSFUNCTION. ELEVATED VENTRICULAR END-DIASTOLIC FILLING PRESSURE  AND LEFT ATRIAL FILLING PRESSURE. AV- MILD TO MODERATE CALCIFIED ANNULUS. MV- CALCIFIC DEGENERATION.Aaron Aas LA-MILDLY DILATED.   Family History Family History  Problem Relation Age of Onset   Cancer Mother        Pancreatic   Heart disease Mother    Cancer Brother        pancreactic   Cancer Daughter        Breast    Social History Social History   Tobacco Use   Smoking status: Former    Current packs/day: 0.00    Types: Cigarettes    Quit date: 10/21/1976    Years since quitting: 47.4    Passive exposure: Past   Smokeless tobacco: Never  Vaping Use   Vaping status: Never Used  Substance Use Topics   Alcohol  use: Yes    Alcohol /week: 1.0 standard drink of alcohol     Types: 1 Glasses of wine per week    Comment: 1-2 alcoholic drinks daily   Drug use: No   Allergies Patient has no known allergies.  Review of Systems Review of Systems  Physical Exam Vital Signs  I have reviewed the triage vital signs BP 126/67   Pulse 61   Temp 97.6 F (36.4 C) (Oral)   Resp 16   Ht 6' (1.829 m)   Wt 79.5 kg   SpO2 94%   BMI 23.77 kg/m   Physical Exam Vitals reviewed.  Constitutional:      Appearance: He is not toxic-appearing.  Cardiovascular:     Rate and Rhythm: Normal rate. Rhythm irregular.  Abdominal:     General: Abdomen is flat.     Palpations: Abdomen is soft.  Musculoskeletal:     Right lower leg: Edema present.     Left lower leg: Edema present.  Neurological:     Mental Status: He is alert.     ED Results and Treatments Labs (all labs ordered are listed, but only abnormal results are displayed) Labs Reviewed  BASIC METABOLIC PANEL WITH GFR - Abnormal; Notable for the following components:  Result Value   Glucose, Bld 230 (*)    BUN 38 (*)    Creatinine, Ser 1.53 (*)    GFR, Estimated 42 (*)    All other components within normal limits  CBC WITH DIFFERENTIAL/PLATELET - Abnormal; Notable for the following components:   RBC 3.30 (*)    Hemoglobin  8.7 (*)    HCT 28.7 (*)    RDW 19.0 (*)    All other components within normal limits  BRAIN NATRIURETIC PEPTIDE - Abnormal; Notable for the following components:   B Natriuretic Peptide 308.2 (*)    All other components within normal limits                                                                                                                          Radiology DG Chest 1 View Result Date: 03/18/2024 CLINICAL DATA:  Dyspnea EXAM: CHEST  1 VIEW COMPARISON:  03/03/2024 FINDINGS: Cardiomegaly with vascular congestion and pulmonary edema. Small right-sided pleural effusion. Moderate left effusion increased compared to prior. Consolidation at the left base. IMPRESSION: Cardiomegaly with vascular congestion and pulmonary edema appears slightly worse. Small right and moderate left pleural effusions, effusion on the left is increased compared to prior. Consolidation at the left base may be due to atelectasis or pneumonia. Electronically Signed   By: Esmeralda Hedge M.D.   On: 03/18/2024 17:36    Pertinent labs & imaging results that were available during my care of the patient were reviewed by me and considered in my medical decision making (see MDM for details).  Medications Ordered in ED Medications  potassium chloride  SA (KLOR-CON  M) CR tablet 40 mEq (has no administration in time range)                                                                                                                                     Procedures Procedures  (including critical care time)  Medical Decision Making / ED Course   This patient presents to the ED for concern of lower extremity swelling, this involves an extensive number of treatment options, and is a complaint that carries with it a high risk of complications and morbidity.  The differential diagnosis includes pleural effusion, fluid overload, lymphedema, hypokalemia.  MDM: On exam, patient overall well-appearing.  He does have some  increased lower extremity edema.  Will check a BMP, chest x-[place].  Concern  is for recurrent pleural effusion, may require some additional diuresis.  Patient is scheduled to be discharged from his SNF tomorrow.  Patient here today with his daughter at bedside who is a Engineer, civil (consulting).  Reassessment 5:45 PM-patient's blood work has returned.  Normal potassium, BMP at his baseline.  Chest x-[place]rek shows continued pleural effusion.  Do not believe this is a pneumonia.  Had a lengthy discussion with the family about goals of care.  Patient wants to go home.  He feels as though he is approaching the end of his life, and prefer to spend his remaining time at home.  Family also agrees that with the patient's age, they do not want to pursue additional aggressive treatment and would like to continue to have the patient at home.  Patient is stable from medical standpoint.  He has not required any supplemental O2.  They are going to establish care with a palliative care doctor and discuss hospice at home.  Will have them increase the dose of Lasix  to 80 mg for the next few days and have him follow-up with the PCP.  Will send a few days of potassium pills.  Additional history obtained: -Additional history obtained from daughter at bedside -External records from outside source obtained and reviewed including: Chart review including previous notes, labs, imaging, consultation notes   Lab Tests: -I ordered, reviewed, and interpreted labs.   The pertinent results include:   Labs Reviewed  BASIC METABOLIC PANEL WITH GFR - Abnormal; Notable for the following components:      Result Value   Glucose, Bld 230 (*)    BUN 38 (*)    Creatinine, Ser 1.53 (*)    GFR, Estimated 42 (*)    All other components within normal limits  CBC WITH DIFFERENTIAL/PLATELET - Abnormal; Notable for the following components:   RBC 3.30 (*)    Hemoglobin 8.7 (*)    HCT 28.7 (*)    RDW 19.0 (*)    All other components within normal limits  BRAIN  NATRIURETIC PEPTIDE - Abnormal; Notable for the following components:   B Natriuretic Peptide 308.2 (*)    All other components within normal limits      EKG atrial fibrillation, slow rate.  EKG Interpretation Date/Time:  Thursday Mar 18 2024 15:15:37 EDT Ventricular Rate:  54 PR Interval:    QRS Duration:  126 QT Interval:  490 QTC Calculation: 464 R Axis:   74  Text Interpretation: Atrial fibrillation with slow ventricular response Right bundle branch block Septal infarct , age undetermined Abnormal ECG When compared with ECG of 02-May-2023 15:39, PREVIOUS ECG IS PRESENT Confirmed by Afton Horse [phone]) on 03/18/2024 4:55:03 PM         Imaging Studies ordered: I ordered imaging studies including chest x-Agnes I independently visualized and interpreted imaging. I agree with the radiologist interpretation   Medicines ordered and prescription drug management: Meds ordered this encounter  Medications   potassium chloride  SA (KLOR-CON  M) CR tablet 40 mEq   potassium chloride  (KLOR-CON ) 10 MEQ tablet    Sig: Take 1 tablet (10 mEq total) by mouth daily for 5 days.    Dispense:  5 tablet    Refill:  0   furosemide  (LASIX ) 40 MG tablet    Sig: Take 2 tablets (80 mg total) by mouth daily for 7 days.    Dispense:  30 tablet    Refill:  0    -I have reviewed the patients home medicines and have made  adjustments as needed  Cardiac Monitoring: The patient was maintained on a cardiac monitor.  I personally viewed and interpreted the cardiac monitored which showed an underlying rhythm of: Normal sinus rhythm  Social Determinants of Health:  Factors impacting patients care include: Advanced age   Reevaluation: After the interventions noted above, I reevaluated the patient and found that they have :improved  Co morbidities that complicate the patient evaluation  Past Medical History:  Diagnosis Date   Anemia    related to frequent nosebleeds-right nostril.   Aortic  insufficiency    a. mild by echo 2013.   Arthritis    arthritis -back knees, most joints   Atrial flutter (HCC)    a. per report in 2013.   Breathing difficulty 11-10-12   difficult to breath if laying posteriorly, right nare difficulty   Carotid artery disease (HCC)    a. Remote R CEA in the 1990s per pt.   Change in hearing    Diabetes mellitus age 13   Diabetic peripheral neuropathy associated with type 2 diabetes mellitus (HCC) 07/08/2022   Dysrhythmia 07/2012   hx. Atrial Flutter x1, converted spontaneously-no ploblems.    H/O hiatal hernia    HOH (hard of hearing) 11-10-12   bilaterally   Hyperlipidemia    Hypertension    Ulcer 1995   bleeding ulcer, none since      Dispostion: I considered admission for this patient, however the patient prefers to return home and I believe that this is a reasonable choice for this patient to make.     Final Clinical Impression(s) / ED Diagnoses Final diagnoses:  Pleural effusion     @PCDICTATION @    Afton Horse T, DO 03/18/24 1750

## 2024-03-18 NOTE — Discharge Instructions (Addendum)
 I recommend taking 80 mg of Lasix  each day for the next 1 week.  I have also sent you potassium pills to take for the next 5 days.  Please follow-up with your primary care doctor within 1 week.  Return to the emergency room if you experience any shortness of breath.  It was very nice to meet you all.

## 2024-03-18 NOTE — ED Notes (Signed)
 [place] [employer] notified that pt went home with family.  Sheeja at [employer] notified.

## 2024-03-19 DIAGNOSIS — R600 Localized edema: Secondary | ICD-10-CM | POA: Diagnosis not present

## 2024-03-19 DIAGNOSIS — J90 Pleural effusion, not elsewhere classified: Secondary | ICD-10-CM | POA: Diagnosis not present

## 2024-03-19 DIAGNOSIS — Z6823 Body mass index (BMI) 23.0-23.9, adult: Secondary | ICD-10-CM | POA: Diagnosis not present

## 2024-03-19 DIAGNOSIS — E1165 Type 2 diabetes mellitus with hyperglycemia: Secondary | ICD-10-CM | POA: Diagnosis not present

## 2024-03-25 LAB — FUNGUS CULTURE WITH STAIN

## 2024-03-25 LAB — FUNGUS CULTURE RESULT

## 2024-03-25 LAB — FUNGAL ORGANISM REFLEX

## 2024-04-10 LAB — ACID FAST CULTURE WITH REFLEXED SENSITIVITIES (MYCOBACTERIA): Acid Fast Culture: NEGATIVE

## 2024-04-13 ENCOUNTER — Telehealth: Payer: Self-pay | Admitting: Podiatry

## 2024-04-13 NOTE — Telephone Encounter (Signed)
 Called and LMOM for paient on 04/12/24 to R/s his 04/22/24 appointment. Called again on 04/13/24 and spoke with wife, said she would call back to r/s when she talked with his transportation.

## 2024-04-22 ENCOUNTER — Ambulatory Visit: Admitting: Podiatry

## 2024-04-27 ENCOUNTER — Encounter: Payer: Self-pay | Admitting: Podiatry

## 2024-04-27 ENCOUNTER — Ambulatory Visit: Admitting: Podiatry

## 2024-04-27 DIAGNOSIS — B351 Tinea unguium: Secondary | ICD-10-CM

## 2024-04-27 DIAGNOSIS — M79674 Pain in right toe(s): Secondary | ICD-10-CM

## 2024-04-27 DIAGNOSIS — M79675 Pain in left toe(s): Secondary | ICD-10-CM

## 2024-04-27 DIAGNOSIS — E1142 Type 2 diabetes mellitus with diabetic polyneuropathy: Secondary | ICD-10-CM

## 2024-04-27 NOTE — Progress Notes (Signed)
 This patient presents to the office with chief complaint of long thick nails and diabetic feet.  This patient  says there  is  no pain and discomfort in their feet.  This patient says there are long thick painful nails.  These nails are painful walking and wearing shoes.  This patient presents  to the office today for treatment of the  long nails and a foot evaluation due to history of  diabetes.  General Appearance  Alert, conversant and in no acute stress.  Vascular  Dorsalis pedis and posterior tibial  pulses are absent  bilaterally.  Capillary return is within normal limits  bilaterally. Temperature is within normal limits  bilaterally.Venous stasis legs  B/L.  Neurologic  Senn-Weinstein monofilament wire test diminished  bilaterally. Muscle power within normal limits bilaterally.  Nails Thick disfigured discolored nails with subungual debris  hallux nails  bilaterally. No evidence of bacterial infection or drainage bilaterally.  Orthopedic  No limitations of motion of motion feet .  No crepitus or effusions noted.  No bony pathology or digital deformities noted. Midfoot  DJD.  Mild HAV  B/L.  Skin  normotropic skin with no porokeratosis noted bilaterally.  No signs of infections or ulcers noted.      Diabetes with vascular and neurologic pathology.     A diabetic foot exam was performed and there is evidence of any vascular or neurologic pathology.  Debride hallux nails  B/L.  RTC patient is moving to [place] .   Cordella Bold DPM

## 2024-06-02 DIAGNOSIS — S40022S Contusion of left upper arm, sequela: Secondary | ICD-10-CM | POA: Diagnosis not present

## 2024-06-02 DIAGNOSIS — Z5181 Encounter for therapeutic drug level monitoring: Secondary | ICD-10-CM | POA: Diagnosis not present

## 2024-06-02 DIAGNOSIS — Z794 Long term (current) use of insulin: Secondary | ICD-10-CM | POA: Diagnosis not present

## 2024-06-02 DIAGNOSIS — E1140 Type 2 diabetes mellitus with diabetic neuropathy, unspecified: Secondary | ICD-10-CM | POA: Diagnosis not present

## 2024-06-02 DIAGNOSIS — E1165 Type 2 diabetes mellitus with hyperglycemia: Secondary | ICD-10-CM | POA: Diagnosis not present

## 2024-06-02 DIAGNOSIS — Z6822 Body mass index (BMI) 22.0-22.9, adult: Secondary | ICD-10-CM | POA: Diagnosis not present

## 2024-06-02 DIAGNOSIS — R0602 Shortness of breath: Secondary | ICD-10-CM | POA: Diagnosis not present

## 2024-06-23 DIAGNOSIS — E1142 Type 2 diabetes mellitus with diabetic polyneuropathy: Secondary | ICD-10-CM | POA: Diagnosis not present

## 2024-06-23 DIAGNOSIS — B351 Tinea unguium: Secondary | ICD-10-CM | POA: Diagnosis not present

## 2024-08-21 DIAGNOSIS — J90 Pleural effusion, not elsewhere classified: Secondary | ICD-10-CM | POA: Diagnosis not present

## 2024-08-21 DIAGNOSIS — S22009A Unspecified fracture of unspecified thoracic vertebra, initial encounter for closed fracture: Secondary | ICD-10-CM | POA: Diagnosis not present

## 2024-08-21 DIAGNOSIS — S51011A Laceration without foreign body of right elbow, initial encounter: Secondary | ICD-10-CM | POA: Diagnosis not present

## 2024-08-21 DIAGNOSIS — S0990XA Unspecified injury of head, initial encounter: Secondary | ICD-10-CM | POA: Diagnosis not present

## 2024-08-21 DIAGNOSIS — R739 Hyperglycemia, unspecified: Secondary | ICD-10-CM | POA: Diagnosis not present

## 2024-08-21 DIAGNOSIS — S270XXA Traumatic pneumothorax, initial encounter: Secondary | ICD-10-CM | POA: Diagnosis not present

## 2024-08-21 DIAGNOSIS — W01118A Fall on same level from slipping, tripping and stumbling with subsequent striking against other sharp object, initial encounter: Secondary | ICD-10-CM | POA: Diagnosis not present

## 2024-08-21 DIAGNOSIS — Z9889 Other specified postprocedural states: Secondary | ICD-10-CM | POA: Diagnosis not present

## 2024-08-21 DIAGNOSIS — S32009A Unspecified fracture of unspecified lumbar vertebra, initial encounter for closed fracture: Secondary | ICD-10-CM | POA: Diagnosis not present

## 2024-08-22 DIAGNOSIS — R918 Other nonspecific abnormal finding of lung field: Secondary | ICD-10-CM | POA: Diagnosis not present

## 2024-08-22 DIAGNOSIS — W01118A Fall on same level from slipping, tripping and stumbling with subsequent striking against other sharp object, initial encounter: Secondary | ICD-10-CM | POA: Diagnosis not present

## 2024-08-22 DIAGNOSIS — J948 Other specified pleural conditions: Secondary | ICD-10-CM | POA: Diagnosis not present

## 2024-08-22 DIAGNOSIS — S0990XA Unspecified injury of head, initial encounter: Secondary | ICD-10-CM | POA: Diagnosis not present

## 2024-08-22 DIAGNOSIS — R739 Hyperglycemia, unspecified: Secondary | ICD-10-CM | POA: Diagnosis not present

## 2024-08-22 DIAGNOSIS — S22009A Unspecified fracture of unspecified thoracic vertebra, initial encounter for closed fracture: Secondary | ICD-10-CM | POA: Diagnosis not present

## 2024-08-22 DIAGNOSIS — S270XXA Traumatic pneumothorax, initial encounter: Secondary | ICD-10-CM | POA: Diagnosis not present

## 2024-08-22 DIAGNOSIS — J90 Pleural effusion, not elsewhere classified: Secondary | ICD-10-CM | POA: Diagnosis not present

## 2024-08-22 DIAGNOSIS — Z9889 Other specified postprocedural states: Secondary | ICD-10-CM | POA: Diagnosis not present

## 2024-08-22 DIAGNOSIS — S51011A Laceration without foreign body of right elbow, initial encounter: Secondary | ICD-10-CM | POA: Diagnosis not present

## 2024-08-22 DIAGNOSIS — S32009A Unspecified fracture of unspecified lumbar vertebra, initial encounter for closed fracture: Secondary | ICD-10-CM | POA: Diagnosis not present

## 2024-08-23 DIAGNOSIS — S22009A Unspecified fracture of unspecified thoracic vertebra, initial encounter for closed fracture: Secondary | ICD-10-CM | POA: Diagnosis not present

## 2024-08-23 DIAGNOSIS — S270XXA Traumatic pneumothorax, initial encounter: Secondary | ICD-10-CM | POA: Diagnosis not present

## 2024-08-23 DIAGNOSIS — S51011A Laceration without foreign body of right elbow, initial encounter: Secondary | ICD-10-CM | POA: Diagnosis not present

## 2024-08-23 DIAGNOSIS — J90 Pleural effusion, not elsewhere classified: Secondary | ICD-10-CM | POA: Diagnosis not present

## 2024-08-23 DIAGNOSIS — S0990XA Unspecified injury of head, initial encounter: Secondary | ICD-10-CM | POA: Diagnosis not present

## 2024-08-23 DIAGNOSIS — S32009A Unspecified fracture of unspecified lumbar vertebra, initial encounter for closed fracture: Secondary | ICD-10-CM | POA: Diagnosis not present

## 2024-08-23 DIAGNOSIS — R739 Hyperglycemia, unspecified: Secondary | ICD-10-CM | POA: Diagnosis not present

## 2024-08-23 DIAGNOSIS — W01118A Fall on same level from slipping, tripping and stumbling with subsequent striking against other sharp object, initial encounter: Secondary | ICD-10-CM | POA: Diagnosis not present

## 2024-09-20 DEATH — deceased
